# Patient Record
Sex: Female | Born: 1975 | Race: White | Hispanic: No | Marital: Single | Smoking: Former smoker
Health system: Southern US, Community
[De-identification: ages and names within clinical notes are randomized; demographics above are authoritative.]

## PROBLEM LIST (undated history)

## (undated) DIAGNOSIS — H409 Unspecified glaucoma: Secondary | ICD-10-CM

## (undated) DIAGNOSIS — I10 Essential (primary) hypertension: Secondary | ICD-10-CM

## (undated) DIAGNOSIS — F909 Attention-deficit hyperactivity disorder, unspecified type: Secondary | ICD-10-CM

## (undated) DIAGNOSIS — M1990 Unspecified osteoarthritis, unspecified site: Secondary | ICD-10-CM

## (undated) DIAGNOSIS — T7840XA Allergy, unspecified, initial encounter: Secondary | ICD-10-CM

## (undated) DIAGNOSIS — E785 Hyperlipidemia, unspecified: Secondary | ICD-10-CM

## (undated) DIAGNOSIS — S069XAA Unspecified intracranial injury with loss of consciousness status unknown, initial encounter: Secondary | ICD-10-CM

## (undated) DIAGNOSIS — F319 Bipolar disorder, unspecified: Secondary | ICD-10-CM

## (undated) DIAGNOSIS — F419 Anxiety disorder, unspecified: Secondary | ICD-10-CM

## (undated) DIAGNOSIS — S069X9A Unspecified intracranial injury with loss of consciousness of unspecified duration, initial encounter: Secondary | ICD-10-CM

## (undated) HISTORY — DX: Hyperlipidemia, unspecified: E78.5

## (undated) HISTORY — PX: FRACTURE SURGERY: SHX138

## (undated) HISTORY — DX: Allergy, unspecified, initial encounter: T78.40XA

## (undated) HISTORY — PX: BRAIN SURGERY: SHX531

## (undated) HISTORY — PX: WISDOM TOOTH EXTRACTION: SHX21

## (undated) HISTORY — DX: Bipolar disorder, unspecified: F31.9

## (undated) HISTORY — DX: Anxiety disorder, unspecified: F41.9

---

## 2003-10-13 DIAGNOSIS — S069XAA Unspecified intracranial injury with loss of consciousness status unknown, initial encounter: Secondary | ICD-10-CM

## 2003-10-13 HISTORY — DX: Unspecified intracranial injury with loss of consciousness status unknown, initial encounter: S06.9XAA

## 2003-10-28 ENCOUNTER — Inpatient Hospital Stay (HOSPITAL_COMMUNITY): Admission: AC | Admit: 2003-10-28 | Discharge: 2003-11-06 | Payer: Self-pay

## 2003-10-28 HISTORY — PX: BRAIN SURGERY: SHX531

## 2003-11-06 ENCOUNTER — Inpatient Hospital Stay (HOSPITAL_COMMUNITY)
Admission: RE | Admit: 2003-11-06 | Discharge: 2003-12-07 | Payer: Self-pay | Admitting: Physical Medicine & Rehabilitation

## 2003-12-11 ENCOUNTER — Encounter
Admission: RE | Admit: 2003-12-11 | Discharge: 2004-02-27 | Payer: Self-pay | Admitting: Physical Medicine & Rehabilitation

## 2004-01-03 ENCOUNTER — Encounter
Admission: RE | Admit: 2004-01-03 | Discharge: 2004-04-02 | Payer: Self-pay | Admitting: Physical Medicine & Rehabilitation

## 2004-04-07 ENCOUNTER — Encounter
Admission: RE | Admit: 2004-04-07 | Discharge: 2004-07-06 | Payer: Self-pay | Admitting: Physical Medicine & Rehabilitation

## 2004-08-27 ENCOUNTER — Ambulatory Visit: Payer: Self-pay | Admitting: Physical Medicine & Rehabilitation

## 2004-09-12 ENCOUNTER — Encounter: Admission: RE | Admit: 2004-09-12 | Discharge: 2004-12-11 | Payer: Self-pay | Admitting: Psychology

## 2004-10-01 ENCOUNTER — Encounter
Admission: RE | Admit: 2004-10-01 | Discharge: 2004-12-30 | Payer: Self-pay | Admitting: Physical Medicine & Rehabilitation

## 2004-10-08 ENCOUNTER — Ambulatory Visit: Payer: Self-pay | Admitting: Physical Medicine & Rehabilitation

## 2004-10-31 ENCOUNTER — Ambulatory Visit (HOSPITAL_COMMUNITY)
Admission: RE | Admit: 2004-10-31 | Discharge: 2004-10-31 | Payer: Self-pay | Admitting: Physical Medicine & Rehabilitation

## 2005-01-26 ENCOUNTER — Encounter: Admission: RE | Admit: 2005-01-26 | Discharge: 2005-04-26 | Payer: Self-pay | Admitting: Psychology

## 2005-01-29 ENCOUNTER — Encounter
Admission: RE | Admit: 2005-01-29 | Discharge: 2005-04-29 | Payer: Self-pay | Admitting: Physical Medicine & Rehabilitation

## 2005-02-02 ENCOUNTER — Ambulatory Visit: Payer: Self-pay | Admitting: Physical Medicine & Rehabilitation

## 2005-02-23 ENCOUNTER — Ambulatory Visit: Payer: Self-pay | Admitting: Psychology

## 2005-03-31 ENCOUNTER — Ambulatory Visit: Payer: Self-pay | Admitting: Psychology

## 2005-05-08 ENCOUNTER — Ambulatory Visit: Payer: Self-pay | Admitting: Physical Medicine & Rehabilitation

## 2005-05-08 ENCOUNTER — Encounter
Admission: RE | Admit: 2005-05-08 | Discharge: 2005-08-06 | Payer: Self-pay | Admitting: Physical Medicine & Rehabilitation

## 2005-05-29 ENCOUNTER — Ambulatory Visit: Payer: Self-pay | Admitting: Psychology

## 2005-06-29 ENCOUNTER — Ambulatory Visit: Payer: Self-pay | Admitting: Psychology

## 2005-07-22 ENCOUNTER — Encounter: Admission: RE | Admit: 2005-07-22 | Discharge: 2005-10-20 | Payer: Self-pay | Admitting: Psychology

## 2005-07-22 ENCOUNTER — Ambulatory Visit: Payer: Self-pay | Admitting: Psychology

## 2005-08-19 ENCOUNTER — Ambulatory Visit: Payer: Self-pay | Admitting: Psychology

## 2005-09-25 ENCOUNTER — Emergency Department (HOSPITAL_COMMUNITY): Admission: EM | Admit: 2005-09-25 | Discharge: 2005-09-25 | Payer: Self-pay | Admitting: Emergency Medicine

## 2005-10-27 ENCOUNTER — Ambulatory Visit: Payer: Self-pay | Admitting: Psychology

## 2005-10-27 ENCOUNTER — Encounter: Admission: RE | Admit: 2005-10-27 | Discharge: 2006-01-25 | Payer: Self-pay | Admitting: Psychology

## 2005-11-06 ENCOUNTER — Ambulatory Visit: Payer: Self-pay | Admitting: Physical Medicine & Rehabilitation

## 2005-11-06 ENCOUNTER — Encounter
Admission: RE | Admit: 2005-11-06 | Discharge: 2006-02-04 | Payer: Self-pay | Admitting: Physical Medicine & Rehabilitation

## 2005-11-18 ENCOUNTER — Ambulatory Visit: Payer: Self-pay | Admitting: Psychology

## 2005-12-07 ENCOUNTER — Ambulatory Visit: Payer: Self-pay | Admitting: Physical Medicine & Rehabilitation

## 2005-12-23 ENCOUNTER — Ambulatory Visit: Payer: Self-pay | Admitting: Psychology

## 2006-01-20 ENCOUNTER — Ambulatory Visit: Payer: Self-pay | Admitting: Psychology

## 2006-02-17 ENCOUNTER — Ambulatory Visit: Payer: Self-pay | Admitting: Psychology

## 2006-03-05 ENCOUNTER — Encounter
Admission: RE | Admit: 2006-03-05 | Discharge: 2006-06-03 | Payer: Self-pay | Admitting: Physical Medicine & Rehabilitation

## 2006-03-05 ENCOUNTER — Ambulatory Visit: Payer: Self-pay | Admitting: Physical Medicine & Rehabilitation

## 2006-03-12 ENCOUNTER — Emergency Department (HOSPITAL_COMMUNITY): Admission: EM | Admit: 2006-03-12 | Discharge: 2006-03-12 | Payer: Self-pay | Admitting: Emergency Medicine

## 2006-04-13 ENCOUNTER — Encounter: Admission: RE | Admit: 2006-04-13 | Discharge: 2006-07-12 | Payer: Self-pay | Admitting: Psychology

## 2006-05-03 ENCOUNTER — Ambulatory Visit: Payer: Self-pay | Admitting: Physical Medicine & Rehabilitation

## 2006-05-31 ENCOUNTER — Ambulatory Visit: Payer: Self-pay | Admitting: Psychology

## 2006-07-12 ENCOUNTER — Ambulatory Visit: Payer: Self-pay | Admitting: Physical Medicine & Rehabilitation

## 2006-07-12 ENCOUNTER — Encounter
Admission: RE | Admit: 2006-07-12 | Discharge: 2006-10-10 | Payer: Self-pay | Admitting: Physical Medicine & Rehabilitation

## 2006-08-11 ENCOUNTER — Ambulatory Visit: Payer: Self-pay | Admitting: Psychology

## 2006-10-22 ENCOUNTER — Ambulatory Visit: Payer: Self-pay | Admitting: Physical Medicine & Rehabilitation

## 2006-10-22 ENCOUNTER — Encounter
Admission: RE | Admit: 2006-10-22 | Discharge: 2007-01-20 | Payer: Self-pay | Admitting: Physical Medicine & Rehabilitation

## 2007-01-06 ENCOUNTER — Ambulatory Visit: Payer: Self-pay | Admitting: Psychology

## 2007-02-10 ENCOUNTER — Encounter
Admission: RE | Admit: 2007-02-10 | Discharge: 2007-05-11 | Payer: Self-pay | Admitting: Physical Medicine & Rehabilitation

## 2007-02-10 ENCOUNTER — Ambulatory Visit: Payer: Self-pay | Admitting: Physical Medicine & Rehabilitation

## 2007-03-10 ENCOUNTER — Ambulatory Visit: Payer: Self-pay | Admitting: Psychology

## 2007-05-16 ENCOUNTER — Ambulatory Visit: Payer: Self-pay | Admitting: Psychology

## 2007-06-02 ENCOUNTER — Encounter
Admission: RE | Admit: 2007-06-02 | Discharge: 2007-07-12 | Payer: Self-pay | Admitting: Physical Medicine & Rehabilitation

## 2007-06-02 ENCOUNTER — Ambulatory Visit: Payer: Self-pay | Admitting: Physical Medicine & Rehabilitation

## 2007-07-18 ENCOUNTER — Ambulatory Visit: Payer: Self-pay | Admitting: Psychology

## 2007-07-18 ENCOUNTER — Encounter: Admission: RE | Admit: 2007-07-18 | Discharge: 2007-10-16 | Payer: Self-pay | Admitting: Psychology

## 2007-08-11 ENCOUNTER — Ambulatory Visit: Payer: Self-pay | Admitting: Physical Medicine & Rehabilitation

## 2007-09-15 ENCOUNTER — Ambulatory Visit: Payer: Self-pay | Admitting: Psychology

## 2007-11-15 ENCOUNTER — Ambulatory Visit: Payer: Self-pay | Admitting: Psychology

## 2007-11-18 ENCOUNTER — Ambulatory Visit: Payer: Self-pay | Admitting: Physical Medicine & Rehabilitation

## 2007-11-18 ENCOUNTER — Encounter
Admission: RE | Admit: 2007-11-18 | Discharge: 2007-11-21 | Payer: Self-pay | Admitting: Physical Medicine & Rehabilitation

## 2008-01-12 ENCOUNTER — Ambulatory Visit: Payer: Self-pay | Admitting: Psychology

## 2008-04-19 ENCOUNTER — Encounter
Admission: RE | Admit: 2008-04-19 | Discharge: 2008-04-20 | Payer: Self-pay | Admitting: Physical Medicine & Rehabilitation

## 2008-04-20 ENCOUNTER — Ambulatory Visit: Payer: Self-pay | Admitting: Physical Medicine & Rehabilitation

## 2010-11-01 ENCOUNTER — Encounter: Payer: Self-pay | Admitting: Neurosurgery

## 2011-02-24 NOTE — Assessment & Plan Note (Signed)
Sarah Salas is back regarding her brain injury.  We increased her Concerta  back in October to 36 mg and essentially this was a bit much for her and  she became a bit irritable and jittery.  She remains on her Zoloft,  Tegretol, and Topamax.  Moods have been fairly appropriate.  She is now  in line to do some volunteer work at [facility] and is  excited about that.  She continues to work on her diet and weight.  I do  not think she is as strict with her diet as she needs to be.  She is  active with working out, however.  She denies pain today.  She is  sleeping well.  Mood has been good.   REVIEW OF SYSTEMS:  Notable for the above.  Full review is in the  written health and history section of the chart.   SOCIAL HISTORY:  Outlined above.   PHYSICAL EXAM:  Blood pressure is 133/79.  Pulse 88.  Respiratory rate  18.  She is satting 97% on room air.  Patient is pleasant, alert and  oriented x3.  She remains a bit impulsive still.  She became tearful  again today after a bit of discussion although she settled down quickly.  She still lacks some social manners but is really overall much improved  in that department.  She has lost weight.  HEART:  Regular.  CHEST:  Clear.  ABDOMEN:  Soft and nontender.  Strength is 5/5.  Normal sensory exam is noted.  Normal cranial nerve  exam today.   ASSESSMENT:  1. Traumatic brain injury and emotional dyscontrol.  2. Depression and anxiety.  3. History of alcohol and tobacco abuse.  4. Obesity.  5. History of right tibial fracture.   PLAN:  1. We will dose Concerta down to 27 mg daily.  2. Continue Topamax, Klonopin, and Zoloft, as well as Tegretol.  3. Encourage vocational activities.  She will go for her driving test      next month.  I would like to see her move on to the next phase of      her life.  Hopefully, we are getting close.      Ranelle Oyster, M.D.  Electronically Signed     ZTS/MedQ  D:  11/21/2007 15:51:22  T:   11/22/2007 13:31:11  Job #:  [id number]

## 2011-02-24 NOTE — Assessment & Plan Note (Signed)
HISTORY:  Jacqueline is back regarding her brain injury and emotional  dyscontrol.  She has been doing generally fairly well until an episode  when her parents were out where she ordered a few Klonopin.  She is very  remorseful and resentful of this and states, I will never do it again.  She remains on Zoloft, Tegretol and Topamax.  She has found the decrease  in Topamax helped her cognitive blunting.  She denies pain today.  She  is still working on her weight.  She has been writing her memoir.   REVIEW OF SYSTEMS:  Notable for the above.  Full review is in the  written health and history section in chart.   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   PHYSICAL EXAMINATION:  VITAL SIGNS:  Blood pressure 137/67, pulse 98,  respiratory rate 20.  She is satting 97% on room air.  The patient's  weight is stable.  Emotionally she is about the same.  Still some  impulsivity and tearfulness at times.  Volume of her voice was better  today.  Balance was good.  HEART:  Regular.  CHEST:  Clear.  ABDOMEN:  Soft, nontender.  NEUROLOGIC:  Strength 5/5 with normal sensory exam.   ASSESSMENT:  1. Status post traumatic brain injury with emotional dyscontrol.  2. Depression and anxiety.  3. History of alcohol abuse.  4. Tobacco abuse.  5. Obesity.  6. History of right tibial fracture.   PLAN:  1. Will continue with Topamax as is.  2. I would prefer keeping Klonopin as it is, as she has generally been      doing better with this.  We discussed learning from this and moving      forward.  I think she has an idea of the ramifications and is      striving to do better.  3. Will try her on Ritalin to see if we can improve her attention and      focus.  She is a bit perseverative at times and perhaps improving      her attention and focus will help some of these symptoms, as well      as her lability.  4. Consider a switch from  Topamax to another agent to help with      emotional dyscontrol.  5. The patient prefers to stay away from any psychotics due to weight      gain aspects.  6. The patient prefers to stay away from increasing her Klonopin due      to cognitive blunting.  7. I will see her back in two months' time.  I did refill her Zoloft      today #45.      Ranelle Oyster, M.D.  Electronically Signed     ZTS/MedQ  D:  06/03/2007 16:07:49  T:  06/04/2007 17:11:21  Job #:  [id number]

## 2011-02-24 NOTE — Assessment & Plan Note (Signed)
Sarah Salas is back regarding her brain injury.  I last saw her in February.  Apparently, she is moving to [place] where she will be located near a  day program for brain injury patients.  She seems really excited about  this.  She wants to seek some vocational reentry through them as well.  She had an episode where she had something to drink last month.  Her  biggest complaint is some right heel pain, it has been worse with  activity.  It is preventing her from exercising much recently.  It  bothers her with weightbearing and particularly when she walks barefoot,  but it is a bit better when she wears her tennis shoes.  It does not  hurt when she first wakes up in the morning, but does get worse as she  progresses through the day.  She rates the pain 8/10.  She does reports  some agitation at times, but nothing more than baseline.   REVIEW OF SYSTEMS:  Notable for the above.  Full review is in the  written health and history section of the chart.   SOCIAL HISTORY:  As noted above.  Family remain involved in her care.   PHYSICAL EXAMINATION:  VITAL SIGNS:  Blood pressure 133/77, pulse 78,  respiratory rate 24, and she is saturating 95% on room air.  GENERAL:  The patient is pleasant, alert, and oriented x3.  Affect is  anxious.  Gait is antalgic to the right.  Right heel is painful to  palpation.  She had no heel pain with plantar fascia stretching,  rotation, or movement of the foot.  No swelling was seen.  She remains  overweight but generally stable.  She is tearful at times when talking  about her pain.  HEART:  Regular.  CHEST:  Clear.  ABDOMEN:  Soft and nontender.   ASSESSMENT:  1. 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  2. Depression and anxiety.  3. History of alcohol and tobacco abuse.  4. Obesity.  5. Right tibial fracture.  6. Likely heel spurs.   PLAN:  1. Continue low-dose Concerta 27 mg daily.  I gave her 2 months'  refills.  2. Topamax, Klonopin, Zoloft, and Tegretol.  We are about as stable as      we have been with her emotional levels.  With too much medications,      she has been blunted in her affect and cognition, so I really would      prefer not to change things.  3. I recommended over-the-counter naproxen 2 twice a day for right      heel as well as a heel cushion for her shoes.  She needs to wear      her tennis shoes as much as she can as well.  Try ice and observe.      She may benefit from injections if the pain is refractory.  4. The patient is moving out of town and is looking for someone to      continue her care in [place].  I will help her out in the      meantime with medications as appropriate.     Ranelle Oyster, M.D.  Electronically Signed    ZTS/MedQ  D:  04/20/2008 16:54:41  T:  04/21/2008 11:54:54  Job #:  [id number]

## 2011-02-24 NOTE — Assessment & Plan Note (Signed)
Sarah Salas is back regarding her brain injury.  She is doing quite well.  She  has done well with Concerta at 18 mg daily.  She has been much more calm  and controlled, and she notes that she is able to focus better.  Her  family corroborates that.  She remains on Zoloft, Tergitol, and Topamax  for mood stabilization.  She is sleeping fairly well.  She is down to  210 pounds, and has been very active working out, [employer].  She is  excited about that.  She wants to get back to driving.  She denies pain.   REVIEW OF SYSTEMS:  Without issues other than those mentioned above.   SOCIAL HISTORY:  The patient is living with her family and seems to be  doing pretty well at the moment.   PHYSICAL EXAM:  Blood pressure 96/56, pulse 108, respiratory rate 16.  She is satting 98% on room air.  The patient is pleasant, alert and oriented x3.  She is less impulsive,  although still loses control at times.  Volume of voice was good today.  She had excellent balance.  She has lost notable weight.  HEART:  Regular.  CHEST:  Clear.  ABDOMEN:  Soft and nontender.  Strength is 5/5 with normal sensory function.   ASSESSMENT:  1. Traumatic brain injury with emotional dyscontrol.  2. Depression and anxiety.  3. History of alcohol and tobacco abuse.  4. Obesity.  5. History of right tibial fracture.   PLAN:  1. Continue Topamax and Klonopin for now.  Zoloft will also remain      same dose.  2. Increase Concerta to 36 mg daily.  3. Encouraged ongoing exercise and diet as she is doing.  She is      making great strides.  4. Consider driving evaluation in the spring as well as vocational      referral.  5. I will see her back in 4 months' time.      Ranelle Oyster, M.D.  Electronically Signed     ZTS/MedQ  D:  08/12/2007 15:18:17  T:  08/13/2007 12:25:37  Job #:  [id number]
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
## 2014-12-21 ENCOUNTER — Emergency Department (HOSPITAL_COMMUNITY)
Admission: EM | Admit: 2014-12-21 | Discharge: 2014-12-21 | Disposition: A | Discharge status: Home / Self Care | Payer: Medicaid Other | Attending: Emergency Medicine | Admitting: Emergency Medicine

## 2014-12-21 ENCOUNTER — Encounter (HOSPITAL_COMMUNITY): Payer: Self-pay

## 2014-12-21 DIAGNOSIS — Z79899 Other long term (current) drug therapy: Secondary | ICD-10-CM | POA: Diagnosis not present

## 2014-12-21 DIAGNOSIS — Z87891 Personal history of nicotine dependence: Secondary | ICD-10-CM | POA: Insufficient documentation

## 2014-12-21 DIAGNOSIS — I10 Essential (primary) hypertension: Secondary | ICD-10-CM | POA: Insufficient documentation

## 2014-12-21 DIAGNOSIS — F419 Anxiety disorder, unspecified: Secondary | ICD-10-CM | POA: Diagnosis not present

## 2014-12-21 DIAGNOSIS — Z87820 Personal history of traumatic brain injury: Secondary | ICD-10-CM | POA: Insufficient documentation

## 2014-12-21 DIAGNOSIS — R000 Tachycardia, unspecified: Secondary | ICD-10-CM | POA: Diagnosis not present

## 2014-12-21 DIAGNOSIS — Z760 Encounter for issue of repeat prescription: Secondary | ICD-10-CM | POA: Insufficient documentation

## 2014-12-21 HISTORY — DX: Essential (primary) hypertension: I10

## 2014-12-21 HISTORY — DX: Unspecified intracranial injury with loss of consciousness status unknown, initial encounter: S06.9XAA

## 2014-12-21 HISTORY — DX: Unspecified intracranial injury with loss of consciousness of unspecified duration, initial encounter: S06.9X9A

## 2014-12-21 MED ORDER — CLONAZEPAM 1 MG PO TABS: 2.0000 mg | ORAL_TABLET | Freq: Every day | ORAL | Status: DC

## 2014-12-21 NOTE — ED Notes (Signed)
Pt requesting prescription for clonopin.  Sts she ran out the other day and went to her doctor to get refill and clinic was closed.  Was advised by medicaid to follow up here for prescription until new PCP can be established.

## 2014-12-21 NOTE — ED Provider Notes (Signed)
CSN: [id number]     Arrival date & time 12/21/14  1116 History   First MD Initiated Contact with Patient 12/21/14 1258     Chief Complaint  Patient presents with  . Medication Refill     (Consider location/radiation/quality/duration/timing/severity/associated sxs/prior Treatment) HPI   Sarah Salas is a 39 y.o. female requesting medication refill of Klonopin. She takes this for anxiety disorder, she will run out today. She presented to her primary care physician's office and the doors were closed and there was yellow Tape. She attempted to call and could not get through. States she feels anxious but this is the norm. She denies suicidal ideation, homicidal ideation, drug or alcohol abuse, auditory or visual hallucinations, chest pain, shortness of breath, history of DVT or PE, exogenous estrogen, calf pain or leg swelling, recent immobilization or surgery.  Past Medical History  Diagnosis Date  . Hypertension   . Brain injury    History reviewed. No pertinent past surgical history. History reviewed. No pertinent family history. History  Substance Use Topics  . Smoking status: Former Games developermoker  . Smokeless tobacco: Not on file  . Alcohol Use: No   OB History    No data available     Review of Systems  10 systems reviewed and found to be negative, except as noted in the HPI.   Allergies  Review of patient's allergies indicates not on file.  Home Medications   Prior to Admission medications   Not on File   BP 135/75 mmHg  Pulse 113  Temp(Src) 98 F (36.7 C) (Oral)  Resp 20  Ht 5\' 11"  (1.803 m)  Wt 326 lb (147.873 kg)  BMI 45.49 kg/m2  SpO2 98% Physical Exam  Constitutional: She is oriented to person, place, and time. She appears well-developed and well-nourished. No distress.  HENT:  Head: Normocephalic and atraumatic.  Mouth/Throat: Oropharynx is clear and moist.  Eyes: Conjunctivae and EOM are normal. Pupils are equal, round, and reactive to light.  Neck:  Normal range of motion.  Cardiovascular: Regular rhythm and intact distal pulses.   Mildly tachycardic  Pulmonary/Chest: Effort normal and breath sounds normal. No stridor. No respiratory distress. She has no wheezes. She has no rales. She exhibits no tenderness.  Abdominal: Soft. Bowel sounds are normal. There is no tenderness.  Musculoskeletal: Normal range of motion. She exhibits no edema or tenderness.  No calf asymmetry, superficial collaterals, palpable cords, edema, Homans sign negative bilaterally.    Neurological: She is alert and oriented to person, place, and time.  Psychiatric: She has a normal mood and affect.  Nursing note and vitals reviewed.   ED Course  Procedures (including critical care time) Labs Review Labs Reviewed - No data to display  Imaging Review No results found.   EKG Interpretation None      MDM   Final diagnoses:  Medication refill    Filed Vitals:   12/21/14 1127 12/21/14 1302  BP: 135/75 145/96  Pulse: 113 111  Temp: 98 F (36.7 C)   TempSrc: Oral   Resp: 20   Height: 5\' 11"  (1.803 m)   Weight: 326 lb (147.873 kg)   SpO2: 98% 100%    Sarah Salas is a pleasant 39 y.o. female requesting refill of Klonopin. Patient is about her read out today. She takes it for anxiety. I reviewed the [place] state DEA database and patient was written a prescription at the end of January. Patient does not have multiple visits to the ED, her  story is possible that she went to her primary care office and it was closed, she cannot reach them. I've advised her that although it is our policy not to refill prescriptions for controlled substances I will make an exception. She understands that she will not be given further scripts in the future. I've given her a resource guide and also the contact information for the wellness Center to help her stop his primary care. She is mildly tachycardic but I think that that is secondary to her baseline anxiety.    Evaluation does not show pathology that would require ongoing emergent intervention or inpatient treatment. Pt is hemodynamically stable and mentating appropriately. Discussed findings and plan with patient/guardian, who agrees with care plan. All questions answered. Return precautions discussed and outpatient follow up given.   New Prescriptions   CLONAZEPAM (KLONOPIN) 1 MG TABLET    Take 2 tablets (2 mg total) by mouth daily.         Wynetta Emery, PA-C 12/21/14 1452  Gilda Crease, MD 12/21/14 [phone]

## 2014-12-21 NOTE — Discharge Instructions (Signed)
Do not hesitate to return to the emergency room for any new, worsening or concerning symptoms.  Please obtain primary care using resource guide below. But the minute you were seen in the emergency room and that they will need to obtain records for further outpatient management.   Medication Refill, Emergency Department We have refilled your medication today as a courtesy to you. It is best for your medical care, however, to take care of getting refills done through your primary caregiver's office. They have your records and can do a better job of follow-up than we can in the emergency department. On maintenance medications, we often only prescribe enough medications to get you by until you are able to see your regular caregiver. This is a more expensive way to refill medications. In the future, please plan for refills so that you will not have to use the emergency department for this. Thank you for your help. Your help allows [place] to better take care of the daily emergencies that enter our department. Document Released: 01/15/2004 Document Revised: 12/21/2011 Document Reviewed: 01/05/2014 [facility] Patient Information 2015 [place], [place]. This information is not intended to replace advice given to you by your health care provider. Make sure you discuss any questions you have with your health care provider.    Emergency Department Resource Guide 1) Find a Doctor and Pay Out of Pocket Although you won't have to find out who is covered by your insurance plan, it is a good idea to ask around and get recommendations. You will then need to call the office and see if the doctor you have chosen will accept you as a new patient and what types of options they offer for patients who are self-pay. Some doctors offer discounts or will set up payment plans for their patients who do not have insurance, but you will need to ask so you aren't surprised when you get to your appointment.  2) Contact Your Local Health  Department Not all health departments have doctors that can see patients for sick visits, but many do, so it is worth a call to see if yours does. If you don't know where your local health department is, you can check in your phone book. The CDC also has a tool to help you locate your state's health department, and many state websites also have listings of all of their local health departments.  3) Find a Walk-in Clinic If your illness is not likely to be very severe or complicated, you may want to try a walk in clinic. These are popping up all over the country in pharmacies, drugstores, and shopping centers. They're usually staffed by nurse practitioners or physician assistants that have been trained to treat common illnesses and complaints. They're usually fairly quick and inexpensive. However, if you have serious medical issues or chronic medical problems, these are probably not your best option.  No Primary Care Doctor: - Call Health Connect at  [phone] - they can help you locate a primary care doctor that  accepts your insurance, provides certain services, etc. - Physician Referral Service- [phone]  Chronic Pain Problems: Organization         Address  Phone   Notes  [facility]  [phone] Patients need to be referred by their primary care doctor.   Medication Assistance: Organization         Address  Phone   Notes  [facility] Medication Assistance Program [address] [place] [place], [place]  [address] [phone] --Must be a resident of [facility] -- Must have NO insurance coverage whatsoever (no Medicaid/ Medicare, etc.) -- The pt. MUST have a primary care doctor that directs their care regularly and follows them in the community   [facility]ist  [phone]   [employer]  [phone]    Agencies that provide inexpensive medical care: Organization         Address  Phone   Notes  [facility]  [phone]   [facility]    [phone]   [facility] [address] [place], [place] [address] [phone]   [facility] [address] [place] [place] [phone]   Planned Parenthood    [phone]   [facility]    [phone]   Community Health and [facility]  [address], [place] Phone:  [phone], Fax:  [phone] Hours of Operation:  9 am - 6 pm, M-F.  Also accepts Medicaid/Medicare and self-pay.  [facility]  [address], [place] Phone: [phone], Fax: [phone]. Hours of Operation:  8:30 am - 5:30 pm, M-F.  Also accepts Medicaid and self-pay.  [facility] [place] [address] [place] Phone: [phone]   [facility] [address] Natasha Bence [place], [place] [phone], Ext. [phone] Mondays & Thursdays: 7-9 AM.  First 15 patients are seen on a first come, first serve basis.    Medicaid-accepting [facility] Providers:  Organization         Address  Phone   Notes  [facility] [address], [place] [phone] Also accepts self-pay patients.  [facility] [address] Laurell Josephs [place], [place]  [phone]   [facility] [address], [place] [phone]   [facility] [address] [place] [phone]   Renaye Rakers [address], [place]   [phone] Only accepts [place] patients after they have their name applied to their card.   Self-Pay (no insurance) in [facility]:  Organization         Address  Phone   Notes  Sickle Cell Patients, [facility] [address] [place], [place] [phone]   [facility] [address] [place], [place] [phone]   [facility] [place]  [address],  [place] [phone]   [facility]/Dr. Osei-Bonsu  [address] [place] or [address], [place] [phone] Phone number for both [place] and [place] locations is the same.  [facility] [address] [place] [phone]   [facility] [address] [place] or [address] [phone]   [facility] [address] [place] [phone], phone; [phone], fax Sees patients 1st and 3rd Saturday of every month.  Must not qualify for public or private insurance (i.e. Medicaid, Medicare, [place] Health Choice, Veterans' Benefits)  Household income should be no more than 200% of the poverty level The clinic cannot treat you if you are pregnant or think you are pregnant  Sexually transmitted diseases are not treated at the clinic.    Dental Care: Organization  Address  Phone  Notes  [facility] [address], [place] [phone]) [phone] Accepts children up to age 37 who are enrolled in [place]icaid or [place] Health Choice; pregnant women with a Medicaid card; and children who have applied for Medicaid or [place] Health Choice, but were declined, whose parents can pay a reduced fee at time of service.  [facility] [place]  [address], [place] [phone] Accepts children up to age 37 who are enrolled in [place] or [place] Health Choice; pregnant women with a Medicaid card; and children who have applied for Medicaid or [place] Health Choice, but were declined, whose parents can pay a reduced fee at time of service.  [facility]  [address], [place] [phone] Patients are seen by appointment only. Walk-ins are not accepted. [facility] will see patients 37 years of age and older. Monday - Tuesday (8am-5pm) Most Wednesdays  (8:30-5pm) $30 per visit, cash only  [facility]  [address], [place] [facility] [place] [phone] Patients are seen by appointment only. Walk-ins are not accepted. [facility] will see patients 37 years of age and older. One Wednesday Evening (Monthly: Volunteer Based).  $30 per visit, cash only  [facility]  [phone] for adults; Children under age 434, call Graduate Pediatric Dentistry at [phone]. Children aged 464-14, please call [phone] to request a pediatric application.  Dental services are provided in all areas of dental care including fillings, crowns and bridges, complete and partial dentures, implants, gum treatment, root canals, and extractions. Preventive care is also provided. Treatment is provided to both adults and children. Patients are selected via a lottery and there is often a waiting list.   [facility] [address], [place]  [phone] [email or web address]   [facility] [address], [place], [place] [phone], Ext. [phone] Second and Fourth Thursday of each month, opens at 6:30 AM; Clinic ends at 9 AM.  Patients are seen on a first-come first-served basis, and a limited number are seen during each clinic.   [facility]  [address], [place], [place] [phone]   Eligibility Requirements You must have lived in [place], [place], or [place] counties for at least the last three months.   You cannot be eligible for state or federal sponsored [place] Cityhealthcare insurance, including CIGNAVeterans Administration, [place]icaid, or [employer]Medicare.   You generally cannot be eligible for healthcare insurance through your employer.    How to apply: Eligibility screenings are held every Tuesday and Wednesday afternoon from 1:00 pm until 4:00 pm. You do not need an appointment for the interview!  [facility] [address], [place], [place] [phone]   [facility]  Health Department  [phone]   [facility]  [phone]   [facility]  [phone]    Behavioral Health Resources in the Community: Intensive Outpatient Programs Organization         Address  Phone  Notes  [facility] [address], [place], [place] [phone]   [facility] [address], [place], [place] [phone]   [facility] [address], [place], [place]  [phone]   [facility] [address],  [place], [place] [phone] or [phone]   Substance Abuse Resources  Organization         Address  Phone  Notes  [facility]  [phone]   [facility]  [phone]   [facility]  [phone]   [facility]  [phone]   Residential & Outpatient Substance Abuse Program  [phone]   Psychological Services Organization         Address  Phone  Notes  [facility]  [phone]- [phone]   [facility]  [phone]- [phone]   [facility] [address], [place] [phone] or [phone]    Mobile Crisis Teams Organization         Address  Phone  Notes  [facility], Mobile Crisis Care Unit  [phone]   [facility]  [address] [place], [place] [phone]   Doristine LocksSharon DeEsch [address] [place] [phone]    Self-Help/Support Groups Organization         Address  Phone             Notes  [facility] [place] - variety of support groups  [phone] Call for more information  Narcotics Anonymous (NA), [facility] [address], [place]  2 meetings at this location   Statisticianesidential Treatment Programs Organization         Address  Phone  Notes  [facility] [address],    [place]  [phone]   [facility]  [address], [place] [phone], [place], [place]  [phone]   [facility] [address], [place] [phone] Admissions: 8am-3pm M-F  [facility] [address]    [place], [place] [phone]   [facility] [address], [place], [place] [phone]   [facility] [address]  [place], [place] [phone]   [facility] - Intensive Outpatient [address], [place], [place] [phone]   [facility] ([facility] [address]  [place], [place] [phone] or [phone]   [facility] ([facility]) [address] [place], [place] [phone] Accepts Medicaid  [facility] [address]  [place] [phone] Substance Abuse/Addiction Treatment   [facility] Resources Organization         Address  Phone  Notes  [facility]  [phone]) [phone]   Angie FavaJulie Brannon, PhD [address], [address] [place], [place]   [phone] or [phone]) [phone]   [facility]   [address] [place], [place] [phone]   [facility] [address], [place], [place] [phone] Insurance/Medicaid/sponsorship through [facility]  [facility] [address]                                    [place], [place] [phone] Therapy/tele-psych/case  [facility] [address]   [place], [place] [phone]    Dr. Lolly MustacheArfeen  [phone]   [facility]  [employer] [facility] 1) [address], [place] 2) [address], [place] 3)  [address], [place] [phone]  [phone]   [facility] Child Abuse Hotline [phone] or [phone](336) [phone] (After Hours)

## 2016-02-13 ENCOUNTER — Encounter (HOSPITAL_COMMUNITY): Payer: Self-pay | Admitting: Emergency Medicine

## 2016-02-13 ENCOUNTER — Emergency Department (HOSPITAL_COMMUNITY)
Admission: EM | Admit: 2016-02-13 | Discharge: 2016-02-13 | Disposition: A | Discharge status: Home / Self Care | Payer: Medicaid Other | Attending: Emergency Medicine | Admitting: Emergency Medicine

## 2016-02-13 DIAGNOSIS — J302 Other seasonal allergic rhinitis: Secondary | ICD-10-CM | POA: Diagnosis not present

## 2016-02-13 DIAGNOSIS — Z79899 Other long term (current) drug therapy: Secondary | ICD-10-CM | POA: Diagnosis not present

## 2016-02-13 DIAGNOSIS — J029 Acute pharyngitis, unspecified: Secondary | ICD-10-CM

## 2016-02-13 DIAGNOSIS — I10 Essential (primary) hypertension: Secondary | ICD-10-CM | POA: Insufficient documentation

## 2016-02-13 DIAGNOSIS — Z87891 Personal history of nicotine dependence: Secondary | ICD-10-CM | POA: Diagnosis not present

## 2016-02-13 MED ORDER — CETIRIZINE-PSEUDOEPHEDRINE ER 5-120 MG PO TB12: 1.0000 | ORAL_TABLET | Freq: Two times a day (BID) | ORAL | Status: DC

## 2016-02-13 MED ORDER — HYDROCODONE-ACETAMINOPHEN 7.5-325 MG/15ML PO SOLN: 15.0000 mL | Freq: Four times a day (QID) | ORAL | Status: AC | PRN

## 2016-02-13 MED ORDER — FLUTICASONE PROPIONATE 50 MCG/ACT NA SUSP: 2.0000 | Freq: Every day | NASAL | Status: DC

## 2016-02-13 MED ORDER — GI COCKTAIL ~~LOC~~
30.0000 mL | Freq: Once | ORAL | Status: AC
  Administered 2016-02-13: 30 mL via ORAL
  Filled 2016-02-13: qty 30

## 2016-02-13 NOTE — ED Provider Notes (Signed)
CSN: [id number]     Arrival date & time 02/13/16  1001 History   First MD Initiated Contact with Patient 02/13/16 1038     Chief Complaint  Patient presents with  . Sore Throat     (Consider location/radiation/quality/duration/timing/severity/associated sxs/prior Treatment) Patient is a 40 y.o. female presenting with pharyngitis. The history is provided by the patient.  Sore Throat This is a new problem. Episode onset: 5 days ago. The problem occurs constantly. The problem has been gradually worsening. Pertinent negatives include no chest pain, no abdominal pain, no headaches and no shortness of breath. Associated symptoms comments: Sore throat, nasal congestion, occassional cough.  Unable to sleep due to severe sore throat.. The symptoms are aggravated by swallowing. Nothing relieves the symptoms. Treatments tried: chloraseptic,  halls, q4 hour allergy med  The treatment provided no relief.    Past Medical History  Diagnosis Date  . Hypertension   . Brain injury [facility])    History reviewed. No pertinent past surgical history. History reviewed. No pertinent family history. Social History  Substance Use Topics  . Smoking status: Former Games developer  . Smokeless tobacco: None  . Alcohol Use: No   OB History    No data available     Review of Systems  Constitutional: Negative for fever.  Respiratory: Negative for shortness of breath.   Cardiovascular: Negative for chest pain.  Gastrointestinal: Negative for abdominal pain.  Neurological: Negative for headaches.  All other systems reviewed and are negative.     Allergies  Review of patient's allergies indicates no known allergies.  Home Medications   Prior to Admission medications   Medication Sig Start Date End Date Taking? Authorizing Provider  clonazePAM (KLONOPIN) 1 MG tablet Take 2 tablets (2 mg total) by mouth daily. 12/21/14   Nicole Pisciotta, PA-C   BP 132/88 mmHg  Pulse 101  Temp(Src) 98 F (36.7 C) (Oral)  Resp 17   SpO2 98% Physical Exam  Constitutional: She is oriented to person, place, and time. She appears well-developed and well-nourished. No distress.  HENT:  Head: Normocephalic and atraumatic.  Mouth/Throat: Posterior oropharyngeal erythema present. No oropharyngeal exudate or posterior oropharyngeal edema.  Eyes: Conjunctivae and EOM are normal. Pupils are equal, round, and reactive to light.  Neck: Normal range of motion. Neck supple. No tracheal tenderness present.  Hoarse voice  Cardiovascular: Normal rate, regular rhythm and intact distal pulses.   No murmur heard. Pulmonary/Chest: Effort normal and breath sounds normal. No stridor. No respiratory distress. She has no wheezes. She has no rales.  Musculoskeletal: Normal range of motion. She exhibits no edema or tenderness.  Neurological: She is alert and oriented to person, place, and time.  Skin: Skin is warm and dry. No rash noted. No erythema.  Psychiatric: She has a normal mood and affect. Her behavior is normal.  Nursing note and vitals reviewed.   ED Course  Procedures (including critical care time) Labs Review Labs Reviewed - No data to display  Imaging Review No results found. I have personally reviewed and evaluated these images and lab results as part of my medical decision-making.   EKG Interpretation None      MDM   Final diagnoses:  Seasonal allergic rhinitis  Sore throat    Pt here with nasal drainage, sore throat, hoarse voice and cough for 5 days.  No fever or infectious sx.  Pt most likely has allergy related sx.  Given pain meds for sore throat and started on zyrtec and flonase.  No evidence of strep, epiglottitis, RPA or PTA    Gwyneth SproutWhitney Yidel Teuscher, MD 02/13/16 1055

## 2016-02-13 NOTE — ED Notes (Signed)
Pt c/o sore throat, cough, nasal congestion, hoarseness onset Sunday.

## 2016-02-13 NOTE — Discharge Instructions (Signed)
Allergic Rhinitis Allergic rhinitis is when the mucous membranes in the nose respond to allergens. Allergens are particles in the air that cause your body to have an allergic reaction. This causes you to release allergic antibodies. Through a chain of events, these eventually cause you to release histamine into the blood stream. Although meant to protect the body, it is this release of histamine that causes your discomfort, such as frequent sneezing, congestion, and an itchy, runny nose.  CAUSES Seasonal allergic rhinitis (hay fever) is caused by pollen allergens that may come from grasses, trees, and weeds. Year-round allergic rhinitis (perennial allergic rhinitis) is caused by allergens such as house dust mites, pet dander, and mold spores. SYMPTOMS  Nasal stuffiness (congestion).  Itchy, runny nose with sneezing and tearing of the eyes. DIAGNOSIS Your health care provider can help you determine the allergen or allergens that trigger your symptoms. If you and your health care provider are unable to determine the allergen, skin or blood testing may be used. Your health care provider will diagnose your condition after taking your health history and performing a physical exam. Your health care provider may assess you for other related conditions, such as asthma, pink eye, or an ear infection. TREATMENT Allergic rhinitis does not have a cure, but it can be controlled by:  Medicines that block allergy symptoms. These may include allergy shots, nasal sprays, and oral antihistamines.  Avoiding the allergen. Hay fever may often be treated with antihistamines in pill or nasal spray forms. Antihistamines block the effects of histamine. There are over-the-counter medicines that may help with nasal congestion and swelling around the eyes. Check with your health care provider before taking or giving this medicine. If avoiding the allergen or the medicine prescribed do not work, there are many new medicines  your health care provider can prescribe. Stronger medicine may be used if initial measures are ineffective. Desensitizing injections can be used if medicine and avoidance does not work. Desensitization is when a patient is given ongoing shots until the body becomes less sensitive to the allergen. Make sure you follow up with your health care provider if problems continue. HOME CARE INSTRUCTIONS It is not possible to completely avoid allergens, but you can reduce your symptoms by taking steps to limit your exposure to them. It helps to know exactly what you are allergic to so that you can avoid your specific triggers. SEEK MEDICAL CARE IF:  You have a fever.  You develop a cough that does not stop easily (persistent).  You have shortness of breath.  You start wheezing.  Symptoms interfere with normal daily activities.   This information is not intended to replace advice given to you by your health care provider. Make sure you discuss any questions you have with your health care provider.   Document Released: 06/23/2001 Document Revised: 10/19/2014 Document Reviewed: 06/05/2013 Elsevier Interactive Patient Education 2016 Elsevier Inc.  

## 2016-09-14 ENCOUNTER — Ambulatory Visit (HOSPITAL_COMMUNITY)
Admission: EM | Admit: 2016-09-14 | Discharge: 2016-09-14 | Disposition: A | Discharge status: Home / Self Care | Payer: Medicaid Other | Attending: Family Medicine | Admitting: Family Medicine

## 2016-09-14 ENCOUNTER — Encounter (HOSPITAL_COMMUNITY): Payer: Self-pay | Admitting: Emergency Medicine

## 2016-09-14 DIAGNOSIS — J069 Acute upper respiratory infection, unspecified: Secondary | ICD-10-CM | POA: Diagnosis not present

## 2016-09-14 DIAGNOSIS — B9789 Other viral agents as the cause of diseases classified elsewhere: Secondary | ICD-10-CM

## 2016-09-14 MED ORDER — CETIRIZINE-PSEUDOEPHEDRINE ER 5-120 MG PO TB12
1.0000 | ORAL_TABLET | Freq: Every day | ORAL | 0 refills | Status: DC
Start: 2016-09-14 — End: 2017-08-11

## 2016-09-14 MED ORDER — IPRATROPIUM BROMIDE 0.06 % NA SOLN: 2.0000 | Freq: Four times a day (QID) | NASAL | 1 refills | Status: DC

## 2016-09-14 NOTE — ED Triage Notes (Signed)
On Wednesday her throat starting hurting.  After 24 hours throat pain subsided and had runny nose, cough, drainage in throat.  Non-productive cough.  Symptoms are better today per patient.  Denies fever.

## 2016-09-14 NOTE — ED Notes (Signed)
Bed: UC03 Expected date:  Expected time:  Means of arrival:  Comments: 

## 2016-09-14 NOTE — Discharge Instructions (Signed)
Drink plenty of fluids as discussed, use medicine as prescribed, and mucinex or delsym for cough. Return or see your doctor if further problems °

## 2016-09-14 NOTE — ED Provider Notes (Signed)
[facility]    CSN: [id number] Arrival date & time: 09/14/16  1130     History   Chief Complaint Chief Complaint  Patient presents with  . URI    HPI Gomez CleverlyCarri Sedano is a 40 y.o. female.   The history is provided by the patient.  URI  Presenting symptoms: congestion, cough and rhinorrhea   Presenting symptoms: no fever   Severity:  Mild Onset quality:  Gradual Duration:  5 days Progression:  Partially resolved Chronicity:  New Relieved by:  Prescription medications Associated symptoms: sneezing     Past Medical History:  Diagnosis Date  . Brain injury (HCC)   . Hypertension     There are no active problems to display for this patient.   Past Surgical History:  Procedure Laterality Date  . BRAIN SURGERY      OB History    No data available       Home Medications    Prior to Admission medications   Medication Sig Start Date End Date Taking? Authorizing Provider  cetirizine-pseudoephedrine (ZYRTEC-D) 5-120 MG tablet Take 1 tablet by mouth 2 (two) times daily. 02/13/16   Gwyneth SproutWhitney Plunkett, MD  clonazePAM (KLONOPIN) 1 MG tablet Take 2 tablets (2 mg total) by mouth daily. Patient not taking: Reported on 09/14/2016 12/21/14   Joni ReiningNicole Pisciotta, PA-C  fluticasone (FLONASE) 50 MCG/ACT nasal spray Place 2 sprays into both nostrils daily. Patient not taking: Reported on 09/14/2016 02/13/16   Gwyneth SproutWhitney Plunkett, MD  HYDROcodone-acetaminophen (HYCET) 7.5-325 mg/15 ml solution Take 15 mLs by mouth 4 (four) times daily as needed for severe pain. Patient not taking: Reported on 09/14/2016 02/13/16 02/12/17  Gwyneth SproutWhitney Plunkett, MD    Family History No family history on file.  Social History Social History  Substance Use Topics  . Smoking status: Former Games developermoker  . Smokeless tobacco: Not on file  . Alcohol use No     Allergies   Patient has no known allergies.   Review of Systems Review of Systems  Constitutional: Negative.  Negative for fever.  HENT: Positive  for congestion, postnasal drip, rhinorrhea and sneezing.   Respiratory: Positive for cough.   Cardiovascular: Negative.   All other systems reviewed and are negative.    Physical Exam Triage Vital Signs ED Triage Vitals [09/14/16 1157]  Enc Vitals Group     BP 122/88     Pulse Rate 88     Resp 24     Temp 98.2 F (36.8 C)     Temp Source Oral     SpO2 99 %     Weight      Height      Head Circumference      Peak Flow      Pain Score 1     Pain Loc      Pain Edu?      Excl. in GC?    No data found.   Updated Vital Signs BP 122/88 (BP Location: Left Arm) Comment (BP Location): large cuff  Pulse 88   Temp 98.2 F (36.8 C) (Oral)   Resp 24   SpO2 99%   Visual Acuity Right Eye Distance:   Left Eye Distance:   Bilateral Distance:    Right Eye Near:   Left Eye Near:    Bilateral Near:     Physical Exam  Constitutional: She is oriented to person, place, and time. She appears well-developed and well-nourished.  HENT:  Right Ear: External ear normal.  Left Ear:  External ear normal.  Nose: Nose normal.  Mouth/Throat: Oropharynx is clear and moist.  Eyes: Pupils are equal, round, and reactive to light.  Neck: Normal range of motion. Neck supple.  Cardiovascular: Normal rate and regular rhythm.   Pulmonary/Chest: Effort normal and breath sounds normal.  Lymphadenopathy:    She has no cervical adenopathy.  Neurological: She is alert and oriented to person, place, and time.  Skin: Skin is warm and dry.  Nursing note and vitals reviewed.    UC Treatments / Results  Labs (all labs ordered are listed, but only abnormal results are displayed) Labs Reviewed - No data to display  EKG  EKG Interpretation None       Radiology No results found.  Procedures Procedures (including critical care time)  Medications Ordered in UC Medications - No data to display   Initial Impression / Assessment and Plan / UC Course  I have reviewed the triage vital signs  and the nursing notes.  Pertinent labs & imaging results that were available during my care of the patient were reviewed by me and considered in my medical decision making (see chart for details).  Clinical Course       Final Clinical Impressions(s) / UC Diagnoses   Final diagnoses:  None    New Prescriptions New Prescriptions   No medications on file     Linna HoffJames D Kindl, MD 09/14/16 1219

## 2017-08-11 ENCOUNTER — Encounter (HOSPITAL_COMMUNITY): Payer: Self-pay | Admitting: Emergency Medicine

## 2017-08-11 ENCOUNTER — Ambulatory Visit (HOSPITAL_COMMUNITY)
Admission: EM | Admit: 2017-08-11 | Discharge: 2017-08-11 | Disposition: A | Discharge status: Home / Self Care | Payer: Medicaid Other | Attending: Family Medicine | Admitting: Family Medicine

## 2017-08-11 DIAGNOSIS — F419 Anxiety disorder, unspecified: Secondary | ICD-10-CM

## 2017-08-11 MED ORDER — CITALOPRAM HYDROBROMIDE 10 MG PO TABS: 10.0000 mg | ORAL_TABLET | Freq: Every day | ORAL | 3 refills | Status: DC

## 2017-08-11 NOTE — Discharge Instructions (Signed)
One great office that takes Medicaid is [facility] but they usually have a 6 week waiting time for appointments.
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
## 2017-08-11 NOTE — ED Triage Notes (Signed)
PT reports she needs "mood medicine." PT took Wellbutrin a few years ago, but did not like it. PT has been taking HTP over the counter.  PT reports a few people in her family are taking citalopram with good results.

## 2017-09-06 ENCOUNTER — Encounter (HOSPITAL_COMMUNITY): Payer: Self-pay | Admitting: Emergency Medicine

## 2017-09-06 ENCOUNTER — Other Ambulatory Visit: Payer: Self-pay

## 2017-09-06 ENCOUNTER — Ambulatory Visit (HOSPITAL_COMMUNITY)
Admission: EM | Admit: 2017-09-06 | Discharge: 2017-09-06 | Disposition: A | Discharge status: Home / Self Care | Payer: Medicaid Other

## 2017-09-06 DIAGNOSIS — M79604 Pain in right leg: Secondary | ICD-10-CM

## 2017-09-06 MED ORDER — CITALOPRAM HYDROBROMIDE 20 MG PO TABS: 20.0000 mg | ORAL_TABLET | Freq: Every day | ORAL | 0 refills | Status: DC

## 2017-09-06 MED ORDER — NAPROXEN 375 MG PO TABS: 375.0000 mg | ORAL_TABLET | Freq: Two times a day (BID) | ORAL | 0 refills | Status: AC

## 2017-09-06 NOTE — Discharge Instructions (Signed)
Nothing concerning regarding your lower leg pain. We will try pain relief with Naprosyn, ice and heat. I expect pain to improve over the next 1-2 weeks. Please return if symptoms fail to improve.   Please get set up with PCP to refill your Celexa.

## 2017-09-06 NOTE — ED Provider Notes (Signed)
[facility]    CSN: [id number] Arrival date & time: 09/06/17  1006     History   Chief Complaint Chief Complaint  Patient presents with  . Leg Pain    HPI Sarah Salas is a 41 y.o. female coming in with right lower leg pain. She endorses pain on the front of her right lower shin since Saturday. Pain worsens with activity, relieved with rest. No changes of color or temperature with activity. No increase in activity of recent. Minimal activity, except walking 2 big dogs. She has tried Tylenol 500 mg, with minimal relief. No pain in calf, no chest pain, cough, or shortness of breath.   HPI  Past Medical History:  Diagnosis Date  . Brain injury (HCC)   . Hypertension     There are no active problems to display for this patient.   Past Surgical History:  Procedure Laterality Date  . BRAIN SURGERY      OB History    No data available       Home Medications    Prior to Admission medications   Medication Sig Start Date End Date Taking? Authorizing Provider  acetaminophen (TYLENOL) 500 MG tablet Take 500 mg by mouth 4 (four) times daily.   Yes [provider]  ipratropium (ATROVENT) 0.06 % nasal spray Place 2 sprays into both nostrils 4 (four) times daily. 09/14/16  Yes Kindl, Quita SkyeJames D, MD  citalopram (CELEXA) 20 MG tablet Take 1 tablet (20 mg total) by mouth daily. 09/06/17 10/06/17  Tyianna Menefee C, PA-C  naproxen (NAPROSYN) 375 MG tablet Take 1 tablet (375 mg total) by mouth 2 (two) times daily for 7 days. 09/06/17 09/13/17  Donyel Castagnola, Junius CreamerHallie C, PA-C    Family History History reviewed. No pertinent family history.  Social History Social History   Tobacco Use  . Smoking status: Former Games developermoker  . Smokeless tobacco: Never Used  Substance Use Topics  . Alcohol use: No  . Drug use: No     Allergies   Patient has no known allergies.   Review of Systems Review of Systems  Respiratory: Negative for shortness of breath.   Cardiovascular:  Negative for chest pain.  Musculoskeletal: Negative for arthralgias.       Right lower shin pain  Skin: Negative for color change, pallor and rash.  Neurological: Negative for weakness and numbness.     Physical Exam Triage Vital Signs ED Triage Vitals  Enc Vitals Group     BP 09/06/17 1039 115/83     Pulse Rate 09/06/17 1039 94     Resp 09/06/17 1039 20     Temp 09/06/17 1039 97.8 F (36.6 C)     Temp Source 09/06/17 1039 Oral     SpO2 09/06/17 1039 97 %     Weight --      Height --      Head Circumference --      Peak Flow --      Pain Score 09/06/17 1034 5     Pain Loc --      Pain Edu? --      Excl. in GC? --    No data found.  Updated Vital Signs BP 115/83 (BP Location: Right Arm)   Pulse 94   Temp 97.8 F (36.6 C) (Oral)   Resp 20   SpO2 97%    Physical Exam  Constitutional: She is oriented to person, place, and time. She appears well-developed and well-nourished.  HENT:  Head: Normocephalic  and atraumatic.  Cardiovascular: Normal rate and regular rhythm.  Pulmonary/Chest: Effort normal and breath sounds normal.  Musculoskeletal:  Right lower leg: no edema or erythema, temperature similar to left. Full ROM of ankle and knee. Pain increased with dorsiflexion of ankle. Mild tenderness to palpation from lower shin to mid shin. No tenderness to calf. Dorsalis Pedis 2+  Neurological: She is alert and oriented to person, place, and time. No sensory deficit.  Skin: Skin is warm and dry. Capillary refill takes less than 2 seconds.     UC Treatments / Results  Labs (all labs ordered are listed, but only abnormal results are displayed) Labs Reviewed - No data to display  EKG  EKG Interpretation None       Radiology No results found.  Procedures  Procedures (including critical care time)  Medications Ordered in UC Medications - No data to display   Initial Impression / Assessment and Plan / UC Course  I have reviewed the triage vital signs and the  nursing notes.  Pertinent labs & imaging results that were available during my care of the patient were reviewed by me and considered in my medical decision making (see chart for details).    Discussed no need for imaging at this time. Recommended to give herself time with rest and pain management and pain likely will improve in 1-2 weeks. Recommended ice and heat along with naprosyn. Patient agreeable to this plan.  Patient was refilled on Celexa, advised to get set up with PCP for refills. She received info at last visit for this and verbalized she still had the information.   Final Clinical Impressions(s) / UC Diagnoses   Final diagnoses:  Right leg pain    ED Discharge Orders        Ordered    naproxen (NAPROSYN) 375 MG tablet  2 times daily     09/06/17 1105    citalopram (CELEXA) 20 MG tablet  Daily     09/06/17 1105       Controlled Substance Prescriptions [place] Controlled Substance Registry consulted? Not Applicable   Lew DawesWieters, Trashawn Oquendo C, [place] JerseyPA-C 09/06/17 1122

## 2017-09-06 NOTE — ED Triage Notes (Signed)
Per pt right leg pain. Per pt it started on Saturday. Per pt when she do not move it's fine but when she starts walking it starts to hurt..../or

## 2017-11-05 ENCOUNTER — Encounter: Payer: Self-pay | Admitting: Nurse Practitioner

## 2017-11-05 ENCOUNTER — Ambulatory Visit: Payer: Medicaid Other | Attending: Nurse Practitioner | Admitting: Nurse Practitioner

## 2017-11-05 VITALS — BP 124/88 | HR 83 | Temp 98.6°F | Ht 70.0 in | Wt 270.0 lb

## 2017-11-05 DIAGNOSIS — Z79899 Other long term (current) drug therapy: Secondary | ICD-10-CM | POA: Diagnosis not present

## 2017-11-05 DIAGNOSIS — F319 Bipolar disorder, unspecified: Secondary | ICD-10-CM | POA: Diagnosis not present

## 2017-11-05 DIAGNOSIS — Z30433 Encounter for removal and reinsertion of intrauterine contraceptive device: Secondary | ICD-10-CM | POA: Diagnosis not present

## 2017-11-05 DIAGNOSIS — F3181 Bipolar II disorder: Secondary | ICD-10-CM | POA: Diagnosis not present

## 2017-11-05 DIAGNOSIS — Z8249 Family history of ischemic heart disease and other diseases of the circulatory system: Secondary | ICD-10-CM | POA: Diagnosis not present

## 2017-11-05 DIAGNOSIS — I10 Essential (primary) hypertension: Secondary | ICD-10-CM | POA: Insufficient documentation

## 2017-11-05 DIAGNOSIS — F419 Anxiety disorder, unspecified: Secondary | ICD-10-CM | POA: Diagnosis not present

## 2017-11-05 DIAGNOSIS — Z87820 Personal history of traumatic brain injury: Secondary | ICD-10-CM | POA: Diagnosis not present

## 2017-11-05 DIAGNOSIS — Z818 Family history of other mental and behavioral disorders: Secondary | ICD-10-CM | POA: Insufficient documentation

## 2017-11-05 MED ORDER — CITALOPRAM HYDROBROMIDE 40 MG PO TABS: 40.0000 mg | ORAL_TABLET | Freq: Every day | ORAL | 2 refills | Status: DC
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
## 2017-11-05 NOTE — Patient Instructions (Addendum)
Health Maintenance, Female Adopting a healthy lifestyle and getting preventive care can go a long way to promote health and wellness. Talk with your health care provider about what schedule of regular examinations is right for you. This is a good chance for you to check in with your provider about disease prevention and staying healthy. In between checkups, there are plenty of things you can do on your own. Experts have done a lot of research about which lifestyle changes and preventive measures are most likely to keep you healthy. Ask your health care provider for more information. Weight and diet Eat a healthy diet  Be sure to include plenty of vegetables, fruits, low-fat dairy products, and lean protein.  Do not eat a lot of foods high in solid fats, added sugars, or salt.  Get regular exercise. This is one of the most important things you can do for your health. ? Most adults should exercise for at least 150 minutes each week. The exercise should increase your heart rate and make you sweat (moderate-intensity exercise). ? Most adults should also do strengthening exercises at least twice a week. This is in addition to the moderate-intensity exercise.  Maintain a healthy weight  Body mass index (BMI) is a measurement that can be used to identify possible weight problems. It estimates body fat based on height and weight. Your health care provider can help determine your BMI and help you achieve or maintain a healthy weight.  For females 20 years of age and older: ? A BMI below 18.5 is considered underweight. ? A BMI of 18.5 to 24.9 is normal. ? A BMI of 25 to 29.9 is considered overweight. ? A BMI of 30 and above is considered obese.  Watch levels of cholesterol and blood lipids  You should start having your blood tested for lipids and cholesterol at 40 years of age, then have this test every 5 years.  You may need to have your cholesterol levels checked more often if: ? Your lipid or  cholesterol levels are high. ? You are older than 40 years of age. ? You are at high risk for heart disease.  Cancer screening Lung Cancer  Lung cancer screening is recommended for adults 55-80 years old who are at high risk for lung cancer because of a history of smoking.  A yearly low-dose CT scan of the lungs is recommended for people who: ? Currently smoke. ? Have quit within the past 15 years. ? Have at least a 30-pack-year history of smoking. A pack year is smoking an average of one pack of cigarettes a day for 1 year.  Yearly screening should continue until it has been 15 years since you quit.  Yearly screening should stop if you develop a health problem that would prevent you from having lung cancer treatment.  Breast Cancer  Practice breast self-awareness. This means understanding how your breasts normally appear and feel.  It also means doing regular breast self-exams. Let your health care provider know about any changes, no matter how small.  If you are in your 20s or 30s, you should have a clinical breast exam (CBE) by a health care provider every 1-3 years as part of a regular health exam.  If you are 40 or older, have a CBE every year. Also consider having a breast X-ray (mammogram) every year.  If you have a family history of breast cancer, talk to your health care provider about genetic screening.  If you are at high risk   for breast cancer, talk to your health care provider about having an MRI and a mammogram every year.  Breast cancer gene (BRCA) assessment is recommended for women who have family members with BRCA-related cancers. BRCA-related cancers include: ? Breast. ? Ovarian. ? Tubal. ? Peritoneal cancers.  Results of the assessment will determine the need for genetic counseling and BRCA1 and BRCA2 testing.  Cervical Cancer Your health care provider may recommend that you be screened regularly for cancer of the pelvic organs (ovaries, uterus, and  vagina). This screening involves a pelvic examination, including checking for microscopic changes to the surface of your cervix (Pap test). You may be encouraged to have this screening done every 3 years, beginning at age 22.  For women ages 56-65, health care providers may recommend pelvic exams and Pap testing every 3 years, or they may recommend the Pap and pelvic exam, combined with testing for human papilloma virus (HPV), every 5 years. Some types of HPV increase your risk of cervical cancer. Testing for HPV may also be done on women of any age with unclear Pap test results.  Other health care providers may not recommend any screening for nonpregnant women who are considered low risk for pelvic cancer and who do not have symptoms. Ask your health care provider if a screening pelvic exam is right for you.  If you have had past treatment for cervical cancer or a condition that could lead to cancer, you need Pap tests and screening for cancer for at least 20 years after your treatment. If Pap tests have been discontinued, your risk factors (such as having a new sexual partner) need to be reassessed to determine if screening should resume. Some women have medical problems that increase the chance of getting cervical cancer. In these cases, your health care provider may recommend more frequent screening and Pap tests.  Colorectal Cancer  This type of cancer can be detected and often prevented.  Routine colorectal cancer screening usually begins at 40 years of age and continues through 40 years of age.  Your health care provider may recommend screening at an earlier age if you have risk factors for colon cancer.  Your health care provider may also recommend using home test kits to check for hidden blood in the stool.  A small camera at the end of a tube can be used to examine your colon directly (sigmoidoscopy or colonoscopy). This is done to check for the earliest forms of colorectal  cancer.  Routine screening usually begins at age 33.  Direct examination of the colon should be repeated every 5-10 years through 40 years of age. However, you may need to be screened more often if early forms of precancerous polyps or small growths are found.  Skin Cancer  Check your skin from head to toe regularly.  Tell your health care provider about any new moles or changes in moles, especially if there is a change in a mole's shape or color.  Also tell your health care provider if you have a mole that is larger than the size of a pencil eraser.  Always use sunscreen. Apply sunscreen liberally and repeatedly throughout the day.  Protect yourself by wearing long sleeves, pants, a wide-brimmed hat, and sunglasses whenever you are outside.  Heart disease, diabetes, and high blood pressure  High blood pressure causes heart disease and increases the risk of stroke. High blood pressure is more likely to develop in: ? People who have blood pressure in the high end of  the normal range (130-139/85-89 mm Hg). ? People who are overweight or obese. ? People who are African American.  If you are 21-29 years of age, have your blood pressure checked every 3-5 years. If you are 3 years of age or older, have your blood pressure checked every year. You should have your blood pressure measured twice-once when you are at a hospital or clinic, and once when you are not at a hospital or clinic. Record the average of the two measurements. To check your blood pressure when you are not at a hospital or clinic, you can use: ? An automated blood pressure machine at a pharmacy. ? A home blood pressure monitor.  If you are between 17 years and 37 years old, ask your health care provider if you should take aspirin to prevent strokes.  Have regular diabetes screenings. This involves taking a blood sample to check your fasting blood sugar level. ? If you are at a normal weight and have a low risk for diabetes,  have this test once every three years after 40 years of age. ? If you are overweight and have a high risk for diabetes, consider being tested at a younger age or more often. Preventing infection Hepatitis B  If you have a higher risk for hepatitis B, you should be screened for this virus. You are considered at high risk for hepatitis B if: ? You were born in a country where hepatitis B is common. Ask your health care provider which countries are considered high risk. ? Your parents were born in a high-risk country, and you have not been immunized against hepatitis B (hepatitis B vaccine). ? You have HIV or AIDS. ? You use needles to inject street drugs. ? You live with someone who has hepatitis B. ? You have had sex with someone who has hepatitis B. ? You get hemodialysis treatment. ? You take certain medicines for conditions, including cancer, organ transplantation, and autoimmune conditions.  Hepatitis C  Blood testing is recommended for: ? Everyone born from [birth year] through 1963. ? Anyone with known risk factors for hepatitis C.  Sexually transmitted infections (STIs)  You should be screened for sexually transmitted infections (STIs) including gonorrhea and chlamydia if: ? You are sexually active and are younger than 40 years of age. ? You are older than 40 years of age and your health care provider tells you that you are at risk for this type of infection. ? Your sexual activity has changed since you were last screened and you are at an increased risk for chlamydia or gonorrhea. Ask your health care provider if you are at risk.  If you do not have HIV, but are at risk, it may be recommended that you take a prescription medicine daily to prevent HIV infection. This is called pre-exposure prophylaxis (PrEP). You are considered at risk if: ? You are sexually active and do not regularly use condoms or know the HIV status of your partner(s). ? You take drugs by injection. ? You are  sexually active with a partner who has HIV.  Talk with your health care provider about whether you are at high risk of being infected with HIV. If you choose to begin PrEP, you should first be tested for HIV. You should then be tested every 3 months for as long as you are taking PrEP. Pregnancy  If you are premenopausal and you may become pregnant, ask your health care provider about preconception counseling.  If you may become  pregnant, take 400 to 800 micrograms (mcg) of folic acid every day.  If you want to prevent pregnancy, talk to your health care provider about birth control (contraception). Osteoporosis and menopause  Osteoporosis is a disease in which the bones lose minerals and strength with aging. This can result in serious bone fractures. Your risk for osteoporosis can be identified using a bone density scan.  If you are 83 years of age or older, or if you are at risk for osteoporosis and fractures, ask your health care provider if you should be screened.  Ask your health care provider whether you should take a calcium or vitamin D supplement to lower your risk for osteoporosis.  Menopause may have certain physical symptoms and risks.  Hormone replacement therapy may reduce some of these symptoms and risks. Talk to your health care provider about whether hormone replacement therapy is right for you. Follow these instructions at home:  Schedule regular health, dental, and eye exams.  Stay current with your immunizations.  Do not use any tobacco products including cigarettes, chewing tobacco, or electronic cigarettes.  If you are pregnant, do not drink alcohol.  If you are breastfeeding, limit how much and how often you drink alcohol.  Limit alcohol intake to no more than 1 drink per day for nonpregnant women. One drink equals 12 ounces of beer, 5 ounces of wine, or 1 ounces of hard liquor.  Do not use street drugs.  Do not share needles.  Ask your health care  provider for help if you need support or information about quitting drugs.  Tell your health care provider if you often feel depressed.  Tell your health care provider if you have ever been abused or do not feel safe at home. This information is not intended to replace advice given to you by your health care provider. Make sure you discuss any questions you have with your health care provider. Document Released: 04/13/2011 Document Revised: 03/05/2016 Document Reviewed: 07/02/2015 Elsevier Interactive Patient Education  2018 [employer].  Bipolar 2 Disorder Bipolar 2 disorder is a mental health disorder in which a person has episodes of emotional highs (mania) and lows (depression). Bipolar 2 is different from other bipolar disorders because the manic episodes are not as high and do not last as long. This is called hypomania. People with bipolar 2 disorder usually go back and forth between hypomanic and depressive episodes. What are the causes? The cause of this condition is not known. What increases the risk? The following factors may make you more likely to develop this condition:  Having a family member with the disorder.  An imbalance of certain chemicals in the brain (neurotransmitters).  Stress, such as a death, illness, or financial problems.  Certain conditions that affect the brain or spinal cord (neurologic conditions).  Brain injury (trauma).  Having another mental health disorder, such as: ? Obsessive compulsive disorder. ? Schizophrenia.  What are the signs or symptoms? Symptoms of hypomania include:  Very high self-esteem or self-confidence.  Decreased need for sleep.  Unusual talkativeness or feeling a need to keep talking. Speech may be very fast. It may seem like you cannot stop talking.  Racing thoughts or constant talking, with quick shifts between topics that may or may not be related (flight of ideas).  Decreased ability to focus or  concentrate.  Increased purposeful activity, such as work, studies, or social activity.  Increased nonproductive activity. This could be pacing, squirming and fidgeting, or finger and toe tapping.  Impulsive behavior and poor judgment. This may result in high-risk activities, such as having unprotected sex or spending a lot of money.  Symptoms of depression include:  Feeling sad, hopeless, or helpless.  Frequent or uncontrollable crying.  Lack of feeling or caring about anything.  Sleeping too much.  Moving more slowly than usual.  Not being able to enjoy things you used to enjoy.  Desire to be alone all the time.  Feeling guilty or worthless.  Lack of energy or motivation.  Trouble concentrating or remembering.  Trouble making decisions.  Increased appetite.  Thoughts of death or desire to harm yourself.  How is this diagnosed? To diagnose bipolar 2 disorder, your health care provider may ask about your:  Emotional episodes.  Medical history.  Alcohol and drug use. This includes prescription medicines. Certain medical conditions and substances can cause symptoms that seem like bipolar disorder (secondary bipolar disorder).  How is this treated? Bipolar 2 disorder is a long-term (chronic) illness. It is best controlled with ongoing (continuous) treatment rather than being treated only when symptoms occur. Treatment may include:  Psychotherapy. Some forms of talk therapy, such as cognitive-behavioral therapy (CBT), can provide support, education, and guidance.  Coping strategies, such as journaling or relaxation exercises. Relaxation exercises include: ? Yoga. ? Meditation. ? Deep breathing.  Lifestyle changes, such as: ? Limiting alcohol and drug use. ? Exercising regularly. ? Getting plenty of sleep. ? Making healthy eating choices.  Medicine. Medicine can be prescribed by a health care provider who specializes in treating mental disorders  (psychiatrist). ? Medicines called mood stabilizers are usually prescribed. ? If symptoms occur even while taking a mood stabilizer, other medicines may be added.  A combination of medicine, talk therapy, and coping methods is the best way to treat this condition. Follow these instructions at home: Activity  Return to your normal activities as told by your health care provider.  Find activities that you enjoy, and make time to do them.  Exercise regularly as told by your health care provider. Lifestyle  Limit alcohol intake to no more than 1 drink a day for nonpregnant women and 2 drinks a day for men. One drink equals 12 oz of beer, 5 oz of wine, or 1 oz of hard liquor.  Follow a set schedule for eating and sleeping.  Eat a balanced diet that includes fresh fruits and vegetables, whole grains, low-fat dairy, and lean meats.  Get at least 7-8 hours of sleep each night. General instructions  Take over-the-counter and prescription medicines only as told by your health care provider.  Think about joining a support group. Your health care provider may be able to recommend a support group.  Talk with your family and loved ones about your treatment goals and how they can help.  Keep all follow-up visits as told by your health care provider. This is important. Where to find more information: For more information about bipolar 2 disorder, visit the following websites:  [employer] on Mental Illness: www.nami.[place]: https://carter.com/  Contact a health care provider if:  Your symptoms get worse.  You have side effects from your medicine, and they get worse.  You have trouble sleeping.  You have trouble doing daily activities.  You feel unsafe in your surroundings.  You are dealing with substance abuse. Get help right away if:  You have new symptoms.  You have thoughts about harming yourself or others.  You harm  yourself. Summary  Bipolar 2 disorder is a mental health disorder in which a person has episodes of hypomania and depression.  Bipolar 2 is best treated through a combination of medicines, talk therapy, and coping strategies.  Talk with your family and loved ones about your treatment goals and how they can help. This information is not intended to replace advice given to you by your health care provider. Make sure you discuss any questions you have with your health care provider. Document Released: 11/03/2016 Document Revised: 11/03/2016 Document Reviewed: 11/03/2016 Elsevier Interactive Patient Education  2018 [place] is a condition that affects people who have certain types of bipolar disorder. It is a mild form of mania. Mania is when a person has episodes of increased energy, excitement, or irritability that affect his or her daily life. Unlike mania, hypomania does not affect daily life and it may only last for a few days. If hypomania is not treated, it can become more severe and lead to mania or depression. What are the causes? The cause of this condition is not known. What increases the risk? You are more likely to develop hypomania if you have a mood disorder, especially bipolar disorder. If you have a mood disorder, the following factors may increase your risk of developing hypomania:  Not getting enough sleep.  Using substances such as tobacco, caffeine, or illegal drugs.  Certain prescription medicines, such as antidepressants or antibiotics.  Stress or emotional events.  Certain seasons. Hypomania is more common in spring and summer.  The period of time after having a baby (postpartum period).  What are the signs or symptoms? Symptoms of this condition include:  Very high energy or restlessness.  Decreased need for sleep.  Very high self-esteem or self-confidence.  Being unusually talkative, or feeling a need to keep talking. Speech  may be very fast. It may seem like you cannot stop talking.  Racing thoughts or constant talking, with quick shifts between topics that may or may not be related (flight of ideas).  Decreased ability to focus or concentrate.  Increased purposeful activity, such as work, study, or social activity.  Increased nonproductive activity. This could be pacing, squirming and fidgeting, or finger and toe tapping.  Being more irritable.  Impulsive behavior and poor judgment. This may result in high-risk activities, such as having unprotected sex or spending a lot of money.  How is this diagnosed? This condition may be diagnosed based on:  Your symptoms and medical history.  A physical exam. Your health care provider will check for physical conditions that may be causing your symptoms.  A mental health evaluation. You may be referred to a mental health provider who specializes in diagnosing and treating mood disorders.  How is this treated? This condition may be treated with:  Medicines, such as mood stabilizers. Your health care provider may also recommend medicines to help you sleep, if needed.  Talk therapy (psychotherapy) with a mental health provider.  Follow these instructions at home:  Take over-the-counter and prescription medicines only as told by your health care provider.  Consider keeping a journal. Write down a daily log of your mood changes, sleep habits, life events, and medicines. Share this information with your health care provider or therapist.  Find a supportive relative or friend who can help you manage your symptoms.  Keep all follow-up visits as told by your health care provider. This is important. Contact a health care provider if:  Your symptoms do not improve or they  get worse.  You are thinking about changing your medicine or treatment program.  You have side effects from your prescription medicines. Get help right away if:  You are having a mental  health crisis.  You are thinking about harming yourself or thinking about suicide. If you ever feel like you may hurt yourself or others, or have thoughts about taking your own life, get help right away. You can go to your nearest emergency department or call:  Your local emergency services (911 in the [place]  A suicide crisis helpline, such as the [place] at 239-139-6259. This is open 24 hours a day.  Summary  Hypomania is a mild form of mania. Mania is when someone feels increased energy, excitement, or irritability that affects his or her daily life. Hypomania does not affect daily life, and it may only last for a few days.  Symptoms of hypomania may include very high energy or restlessness, unusual talkativeness, and not being able to focus or concentrate.  Treatment for hypomania may include mood stabilizer medicine and talk therapy (psychotherapy) with a mental health provider. This information is not intended to replace advice given to you by your health care provider. Make sure you discuss any questions you have with your health care provider. Document Released: 01/22/2017 Document Revised: 01/22/2017 Document Reviewed: 01/22/2017 Elsevier Interactive Patient Education  [employer].

## 2017-11-08 ENCOUNTER — Encounter: Payer: Self-pay | Admitting: Nurse Practitioner

## 2017-11-08 DIAGNOSIS — F419 Anxiety disorder, unspecified: Secondary | ICD-10-CM | POA: Insufficient documentation

## 2017-11-19 ENCOUNTER — Other Ambulatory Visit (HOSPITAL_COMMUNITY)
Admission: RE | Admit: 2017-11-19 | Discharge: 2017-11-19 | Disposition: A | Discharge status: Home / Self Care | Payer: Medicaid Other | Source: Ambulatory Visit | Attending: Nurse Practitioner | Admitting: Nurse Practitioner

## 2017-11-19 ENCOUNTER — Encounter: Payer: Self-pay | Admitting: Nurse Practitioner

## 2017-11-19 ENCOUNTER — Ambulatory Visit: Payer: Medicaid Other | Attending: Nurse Practitioner | Admitting: Nurse Practitioner

## 2017-11-19 VITALS — BP 128/79 | HR 78 | Temp 97.8°F | Ht 70.0 in | Wt 262.6 lb

## 2017-11-19 DIAGNOSIS — I10 Essential (primary) hypertension: Secondary | ICD-10-CM | POA: Insufficient documentation

## 2017-11-19 DIAGNOSIS — F419 Anxiety disorder, unspecified: Secondary | ICD-10-CM | POA: Insufficient documentation

## 2017-11-19 DIAGNOSIS — Z791 Long term (current) use of non-steroidal anti-inflammatories (NSAID): Secondary | ICD-10-CM | POA: Insufficient documentation

## 2017-11-19 DIAGNOSIS — E669 Obesity, unspecified: Secondary | ICD-10-CM

## 2017-11-19 DIAGNOSIS — F319 Bipolar disorder, unspecified: Secondary | ICD-10-CM | POA: Insufficient documentation

## 2017-11-19 DIAGNOSIS — M25561 Pain in right knee: Secondary | ICD-10-CM | POA: Insufficient documentation

## 2017-11-19 DIAGNOSIS — Z6830 Body mass index (BMI) 30.0-30.9, adult: Secondary | ICD-10-CM | POA: Insufficient documentation

## 2017-11-19 DIAGNOSIS — Z0000 Encounter for general adult medical examination without abnormal findings: Secondary | ICD-10-CM | POA: Insufficient documentation

## 2017-11-19 DIAGNOSIS — Z79899 Other long term (current) drug therapy: Secondary | ICD-10-CM | POA: Diagnosis not present

## 2017-11-19 MED ORDER — MELOXICAM 15 MG PO TABS: 15.0000 mg | ORAL_TABLET | Freq: Every day | ORAL | 1 refills | Status: DC

## 2017-11-19 NOTE — Progress Notes (Addendum)
Assessment & Plan:  Sarah Salas was seen today for annual exam and knee pain.  Diagnoses and all orders for this visit:  Encounter for annual physical exam -     Basic metabolic panel -     CBC -     Cytology - PAP -     Cervicovaginal ancillary only  Obesity (BMI 30-39.9) -     Lipid panel -     TSH Discussed diet and exercise for person with BMI >25. Instructed: You must burn more calories than you eat. Losing 5 percent of your body weight should be considered a success. In the longer term, losing more than 15 percent of your body weight and staying at this weight is an extremely good result. However, keep in mind that even losing 5 percent of your body weight leads to important health benefits, so try not to get discouraged if you're not able to lose more than this. Will recheck weight in 3-6 months.  Acute pain of right knee -     meloxicam (MOBIC) 15 MG tablet; Take 1 tablet (15 mg total) by mouth daily. May alternate with tylenol for pain relief. May apply heat to affected area for pain relief Also losing weight can help reduce the pain from OA    Patient has been counseled on age-appropriate routine health concerns for screening and prevention. These are reviewed and up-to-date. Referrals have been placed accordingly. Immunizations are up-to-date or declined.    Subjective:   Chief Complaint  Patient presents with  . Annual Exam    Patient is here for a physical.  . Knee Pain    Patient stated she have bad right knee pain and would like to have Rx for it.    HPI Sarah Salas 42 y.o. female presents to office today for annual wellness exam.  Knee Pain Patient presents with knee pain involving the  right knee. Onset of the symptoms was several weeks ago. Inciting event: none known. Current symptoms include crepitus sensation, pain located near the tibial tuberosity and swellingPain is aggravated by any weight bearing.  Patient has had prior knee problems. Evaluation to  date: none. Treatment to date: avoidance of offending activity and prescription NSAIDS which are effective. She has been using her father's meloxicam 15mg  daily.  Anxiety She was started on celexa almost 2 weeks ago. Notes some improvement. I would like to wait a few more weeks to see if it will be effective before I switch. She is asking about zoloft which her mother takes but agrees to celexa for a few more weeks.   Review of Systems  Constitutional: Negative.  Negative for chills, fever, malaise/fatigue and weight loss.  HENT: Negative.  Negative for congestion, hearing loss, sinus pain and sore throat.   Eyes: Negative.  Negative for blurred vision, double vision, photophobia and pain.  Respiratory: Negative.  Negative for cough, sputum production, shortness of breath and wheezing.   Cardiovascular: Negative.  Negative for chest pain and leg swelling.  Gastrointestinal: Negative.  Negative for abdominal pain, constipation, diarrhea, heartburn, nausea and vomiting.  Genitourinary: Negative.   Musculoskeletal: Positive for joint pain. Negative for myalgias.       See HPI  Skin: Negative.  Negative for rash.  Neurological: Negative.  Negative for dizziness, tremors, speech change, focal weakness, seizures and headaches.  Endo/Heme/Allergies: Negative.  Negative for environmental allergies.  Psychiatric/Behavioral: Negative.  Negative for depression and suicidal ideas. The patient is not nervous/anxious and does not have insomnia.  Past Medical History:  Diagnosis Date  . Anxiety   . Bipolar 1 disorder (HCC)   . Brain injury (HCC)   . Hypertension     Past Surgical History:  Procedure Laterality Date  . BRAIN SURGERY      Family History  Problem Relation Age of Onset  . Bipolar disorder Mother   . Hypertension Mother   . Bipolar disorder Sister     Social History Reviewed with no changes to be made today.   Outpatient Medications Prior to Visit  Medication Sig Dispense  Refill  . acetaminophen (TYLENOL) 500 MG tablet Take 500 mg by mouth 4 (four) times daily.    . citalopram (CELEXA) 40 MG tablet Take 1 tablet (40 mg total) by mouth daily. 90 tablet 2  . ipratropium (ATROVENT) 0.06 % nasal spray Place 2 sprays into both nostrils 4 (four) times daily. (Patient not taking: Reported on 11/19/2017) 15 mL 1   No facility-administered medications prior to visit.     No Known Allergies     Objective:    BP 128/79 (BP Location: Left Arm, Patient Position: Sitting, Cuff Size: Normal)   Pulse 78   Temp 97.8 F (36.6 C) (Oral)   Ht 5\' 10"  (1.778 m)   Wt 262 lb 9.6 oz (119.1 kg)   SpO2 99%   BMI 37.68 kg/m  Wt Readings from Last 3 Encounters:  11/19/17 262 lb 9.6 oz (119.1 kg)  11/05/17 270 lb (122.5 kg)  08/11/17 250 lb (113.4 kg)    Physical Exam  Constitutional: She is oriented to person, place, and time. She appears well-developed and well-nourished. No distress.  HENT:  Head: Normocephalic and atraumatic.  Right Ear: External ear normal.  Left Ear: External ear normal.  Nose: Nose normal.  Mouth/Throat: Oropharynx is clear and moist. No oropharyngeal exudate.  Eyes: Conjunctivae and EOM are normal. Pupils are equal, round, and reactive to light. Right eye exhibits no discharge. Left eye exhibits no discharge. No scleral icterus.  Neck: Normal range of motion. Neck supple. No tracheal deviation present. No thyromegaly present.  Cardiovascular: Normal rate, regular rhythm, normal heart sounds and intact distal pulses. Exam reveals no friction rub.  No murmur heard. Pulmonary/Chest: Effort normal and breath sounds normal. No respiratory distress. She has no decreased breath sounds. She has no wheezes. She has no rhonchi. She has no rales. She exhibits no tenderness. Right breast exhibits inverted nipple. Right breast exhibits no mass, no nipple discharge, no skin change and no tenderness. Left breast exhibits inverted nipple. Left breast exhibits no  mass, no nipple discharge, no skin change and no tenderness.  Abdominal: Soft. Bowel sounds are normal. She exhibits no distension and no mass. There is no tenderness. There is no rebound and no guarding. Hernia confirmed negative in the right inguinal area and confirmed negative in the left inguinal area.  Genitourinary: Vagina normal. Rectal exam shows no external hemorrhoid, no fissure, no mass and anal tone normal. No breast swelling, tenderness, discharge or bleeding. No labial fusion. There is no rash, tenderness, lesion or injury on the right labia. There is no rash, tenderness, lesion or injury on the left labia. Uterus is not tender. Cervix exhibits discharge. Cervix exhibits no motion tenderness and no friability. Right adnexum displays no mass, no tenderness and no fullness. Left adnexum displays no mass, no tenderness and no fullness. No erythema, tenderness or bleeding in the vagina. No foreign body in the vagina. No signs of injury around the vagina.  No vaginal discharge found.  Musculoskeletal: Normal range of motion. She exhibits no edema, tenderness or deformity.  Lymphadenopathy:    She has no cervical adenopathy.       Right: No inguinal adenopathy present.       Left: No inguinal adenopathy present.  Neurological: She is alert and oriented to person, place, and time. No cranial nerve deficit. Coordination normal.  Skin: Skin is warm and dry. No rash noted. She is not diaphoretic. No erythema. No pallor.  Heels of bilateral feet are very calloused and several bilateral toes with onychomycosis.    Psychiatric: She has a normal mood and affect. Her behavior is normal. Judgment and thought content normal.       Patient has been counseled extensively about nutrition and exercise as well as the importance of adherence with medications and regular follow-up. The patient was given clear instructions to go to ER or return to medical center if symptoms don't improve, worsen or new problems  develop. The patient verbalized understanding.   Follow-up: Return in about 4 weeks (around 12/17/2017) for anxiety.   Claiborne Rigg, FNP-BC [facility] and [facility] [place], [place] [phone]   11/19/2017, 10:19 AM

## 2017-11-20 LAB — CBC
HEMATOCRIT: 40.9 % (ref 34.0–46.6)
Hemoglobin: 13.7 g/dL (ref 11.1–15.9)
MCH: 29.8 pg (ref 26.6–33.0)
MCHC: 33.5 g/dL (ref 31.5–35.7)
MCV: 89 fL (ref 79–97)
Platelets: 351 10*3/uL (ref 150–379)
RBC: 4.59 x10E6/uL (ref 3.77–5.28)
RDW: 14.2 % (ref 12.3–15.4)
WBC: 5.7 10*3/uL (ref 3.4–10.8)

## 2017-11-20 LAB — LIPID PANEL
CHOL/HDL RATIO: 5.1 ratio — AB (ref 0.0–4.4)
Cholesterol, Total: 209 mg/dL — ABNORMAL HIGH (ref 100–199)
HDL: 41 mg/dL (ref >39)
LDL CALC: 134 mg/dL — AB (ref 0–99)
Triglycerides: 171 mg/dL — ABNORMAL HIGH (ref 0–149)
VLDL Cholesterol Cal: 34 mg/dL (ref 5–40)

## 2017-11-20 LAB — TSH: TSH: 1.52 u[IU]/mL (ref 0.450–4.500)

## 2017-11-20 LAB — BASIC METABOLIC PANEL
BUN / CREAT RATIO: 15 (ref 9–23)
BUN: 9 mg/dL (ref 6–20)
CO2: 21 mmol/L (ref 20–29)
CREATININE: 0.59 mg/dL (ref 0.57–1.00)
Calcium: 9.5 mg/dL (ref 8.7–10.2)
Chloride: 104 mmol/L (ref 96–106)
GFR calc Af Amer: 134 mL/min/{1.73_m2} (ref >59)
GFR calc non Af Amer: 116 mL/min/{1.73_m2} (ref >59)
GLUCOSE: 87 mg/dL (ref 65–99)
Potassium: 4.2 mmol/L (ref 3.5–5.2)
Sodium: 144 mmol/L (ref 134–144)

## 2017-11-22 LAB — CERVICOVAGINAL ANCILLARY ONLY
Bacterial vaginitis: NEGATIVE
Candida vaginitis: NEGATIVE
Chlamydia: NEGATIVE
NEISSERIA GONORRHEA: NEGATIVE
TRICH (WINDOWPATH): NEGATIVE

## 2017-11-22 LAB — CYTOLOGY - PAP: Diagnosis: NEGATIVE

## 2017-11-23 ENCOUNTER — Telehealth: Payer: Self-pay

## 2017-11-23 NOTE — Telephone Encounter (Signed)
-----   Message from Claiborne RiggZelda W Fleming, NP sent at 11/22/2017 11:03 PM EST ----- PAP smear is normal. Thyroid level is normal. Tests show increased cholesterol/triglycerides/LDL  levels.  Patient should work on a low fat, heart healthy diet and participate in regular aerobic exercise program to control as well by working out at least150 minutes per week. No fried foods. No junk foods, sodas, sugary drinks, unhealthy snacking, or smoking.

## 2017-11-23 NOTE — Telephone Encounter (Signed)
CMA attempt to call patient and patient did not answer.  CMA left a voicemail to call back.   If patient call please let her know:   !!PAP smear is normal. Thyroid level is normal. Tests show increased cholesterol/triglycerides/LDL  levels.  Patient should work on a low fat, heart healthy diet and participate in regular aerobic exercise program to control as well by working out at least150 minutes per week. No fried foods. No junk foods, sodas, sugary drinks, unhealthy snacking, or smoking.!!

## 2017-11-24 ENCOUNTER — Telehealth: Payer: Self-pay | Admitting: Nurse Practitioner

## 2017-11-24 NOTE — Telephone Encounter (Signed)
Patient called and In informed the patient that PAP smear is normal. Thyroid level is normal. Tests show increased cholesterol/triglycerides/LDL levels. Patient should work on a low fat, heart healthy diet and participate in regular aerobic exercise program to control as well by working out at least 150 minutes per week. No fried foods. No junk foods, sodas, sugary drinks, unhealthy snacking, or smoking

## 2017-12-15 ENCOUNTER — Ambulatory Visit: Payer: Medicaid Other | Attending: Nurse Practitioner | Admitting: Nurse Practitioner

## 2017-12-15 ENCOUNTER — Encounter: Payer: Self-pay | Admitting: Nurse Practitioner

## 2017-12-15 VITALS — BP 114/77 | HR 76 | Temp 98.2°F | Ht 70.0 in | Wt 263.8 lb

## 2017-12-15 DIAGNOSIS — Z760 Encounter for issue of repeat prescription: Secondary | ICD-10-CM | POA: Insufficient documentation

## 2017-12-15 DIAGNOSIS — Z794 Long term (current) use of insulin: Secondary | ICD-10-CM | POA: Insufficient documentation

## 2017-12-15 DIAGNOSIS — F909 Attention-deficit hyperactivity disorder, unspecified type: Secondary | ICD-10-CM | POA: Insufficient documentation

## 2017-12-15 DIAGNOSIS — Z818 Family history of other mental and behavioral disorders: Secondary | ICD-10-CM | POA: Insufficient documentation

## 2017-12-15 DIAGNOSIS — Z79899 Other long term (current) drug therapy: Secondary | ICD-10-CM | POA: Diagnosis not present

## 2017-12-15 DIAGNOSIS — M25561 Pain in right knee: Secondary | ICD-10-CM | POA: Diagnosis not present

## 2017-12-15 DIAGNOSIS — Z791 Long term (current) use of non-steroidal anti-inflammatories (NSAID): Secondary | ICD-10-CM | POA: Insufficient documentation

## 2017-12-15 DIAGNOSIS — Z8249 Family history of ischemic heart disease and other diseases of the circulatory system: Secondary | ICD-10-CM | POA: Insufficient documentation

## 2017-12-15 DIAGNOSIS — F319 Bipolar disorder, unspecified: Secondary | ICD-10-CM | POA: Insufficient documentation

## 2017-12-15 DIAGNOSIS — I10 Essential (primary) hypertension: Secondary | ICD-10-CM | POA: Diagnosis not present

## 2017-12-15 DIAGNOSIS — F419 Anxiety disorder, unspecified: Secondary | ICD-10-CM

## 2017-12-15 MED ORDER — CITALOPRAM HYDROBROMIDE 40 MG PO TABS: 40.0000 mg | ORAL_TABLET | Freq: Every day | ORAL | 2 refills | Status: DC

## 2017-12-15 MED ORDER — MELOXICAM 15 MG PO TABS: 15.0000 mg | ORAL_TABLET | Freq: Every day | ORAL | 2 refills | Status: DC

## 2017-12-15 NOTE — Progress Notes (Signed)
Assessment & Plan:  Sarah Salas was seen today for follow-up and medication refill.  Diagnoses and all orders for this visit:  Anxiety -     citalopram (CELEXA) 40 MG tablet; Take 1 tablet (40 mg total) by mouth daily.  Hyperactive behavior -     Ambulatory referral to Psychiatry  Acute pain of right knee -     meloxicam (MOBIC) 15 MG tablet; Take 1 tablet (15 mg total) by mouth daily. Work on losing weight to help reduce joint pain. May alternate with heat and ice application for pain relief. May also alternate with acetaminophen as prescribed for relief of pain. Other alternatives include massage, acupuncture and water aerobics.  You must stay active and avoid a sedentary lifestyle.   Patient has been counseled on age-appropriate routine health concerns for screening and prevention. These are reviewed and up-to-date. Referrals have been placed accordingly. Immunizations are up-to-date or declined.    Subjective:   Chief Complaint  Patient presents with  . Follow-up    Patient is here for a follow-up on anxiety.   . Medication Refill    Pt's stated she would like to talk to PCP to lower her dose and need medication refills.    HPI Sarah Salas 42 y.o. female presents to office today for follow up of anxiety.   Anxiety She has a history of bipolar disorder and took adderall as a young adult. At her last office appointment in January 2019 her celexa was increased from 20mg  to 40mg . She reports today that her mood has greatly improved. She states her mother has been telling her that she is very hyper and needs to speak with her PCP about "taking a medication that can help calm me down". We discussed her history of bipolar disorder and possibly ADHD/ADD. I have suggested a psychiatry referral for more definitive testing as I am only comfortable with prescribing celexa at this time. She is agreeable. She has taken klonopin in the past as well but experienced bad side effects.   RIght Knee  Pain She was prescribed mobic 15mg  at her last office appointment for right knee pain. Today she endorses decreased swelling and pain relief with taking mobic. She requests a refill today. She is working on losing weight and practicing a GOLO diet which consists of a diet that reduces body insulin and limits glucose conversion into fats.   Review of Systems  Constitutional: Negative for fever, malaise/fatigue and weight loss.  Eyes: Negative.  Negative for blurred vision.  Respiratory: Negative.  Negative for cough and shortness of breath.   Cardiovascular: Negative.  Negative for chest pain, palpitations and leg swelling.  Musculoskeletal: Positive for joint pain. Negative for myalgias.       SEE HPI  Neurological: Negative.  Negative for dizziness, focal weakness, seizures and headaches.  Psychiatric/Behavioral: Negative for substance abuse and suicidal ideas. The patient is nervous/anxious.     Past Medical History:  Diagnosis Date  . Anxiety   . Bipolar 1 disorder (HCC)   . Brain injury (HCC)   . Hypertension     Past Surgical History:  Procedure Laterality Date  . BRAIN SURGERY      Family History  Problem Relation Age of Onset  . Bipolar disorder Mother   . Hypertension Mother   . Bipolar disorder Sister     Social History Reviewed with no changes to be made today.   Outpatient Medications Prior to Visit  Medication Sig Dispense Refill  . citalopram (  CELEXA) 40 MG tablet Take 1 tablet (40 mg total) by mouth daily. 90 tablet 2  . meloxicam (MOBIC) 15 MG tablet Take 1 tablet (15 mg total) by mouth daily. 30 tablet 1  . acetaminophen (TYLENOL) 500 MG tablet Take 500 mg by mouth 4 (four) times daily.    Marland Kitchen. ipratropium (ATROVENT) 0.06 % nasal spray Place 2 sprays into both nostrils 4 (four) times daily. (Patient not taking: Reported on 11/19/2017) 15 mL 1   No facility-administered medications prior to visit.     No Known Allergies     Objective:    BP 114/77 (BP  Location: Right Arm, Patient Position: Sitting, Cuff Size: Large)   Pulse 76   Temp 98.2 F (36.8 C) (Oral)   Ht 5\' 10"  (1.778 m)   Wt 263 lb 12.8 oz (119.7 kg)   SpO2 100%   BMI 37.85 kg/m  Wt Readings from Last 3 Encounters:  12/15/17 263 lb 12.8 oz (119.7 kg)  11/19/17 262 lb 9.6 oz (119.1 kg)  11/05/17 270 lb (122.5 kg)    Physical Exam  Constitutional: She is oriented to person, place, and time. She appears well-developed and well-nourished. She is cooperative.  HENT:  Head: Normocephalic and atraumatic.  Eyes: EOM are normal.  Neck: Normal range of motion.  Cardiovascular: Normal rate, regular rhythm and normal heart sounds. Exam reveals no gallop and no friction rub.  No murmur heard. Pulmonary/Chest: Effort normal and breath sounds normal. No tachypnea. No respiratory distress. She has no decreased breath sounds. She has no wheezes. She has no rhonchi. She has no rales. She exhibits no tenderness.  Abdominal: Soft. Bowel sounds are normal.  Musculoskeletal: Normal range of motion. She exhibits no edema.  Neurological: She is alert and oriented to person, place, and time. Coordination normal.  Skin: Skin is warm and dry.  Psychiatric: She has a normal mood and affect. Her speech is normal and behavior is normal. Judgment and thought content normal. Cognition and memory are normal. She expresses no homicidal and no suicidal ideation. She expresses no suicidal plans and no homicidal plans.  Nursing note and vitals reviewed.     Patient has been counseled extensively about nutrition and exercise as well as the importance of adherence with medications and regular follow-up. The patient was given clear instructions to go to ER or return to medical center if symptoms don't improve, worsen or new problems develop. The patient verbalized understanding.   Follow-up: Return if symptoms worsen or fail to improve.   Claiborne RiggZelda W Aubrina Nieman, FNP-BC [facility]  [place], [place] [phone]   12/15/2017, 2:19 PM

## 2017-12-15 NOTE — Patient Instructions (Addendum)
Generalized Anxiety Disorder, Adult Generalized anxiety disorder (GAD) is a mental health disorder. People with this condition constantly worry about everyday events. Unlike normal anxiety, worry related to GAD is not triggered by a specific event. These worries also do not fade or get better with time. GAD interferes with life functions, including relationships, work, and school. GAD can vary from mild to severe. People with severe GAD can have intense waves of anxiety with physical symptoms (panic attacks). What are the causes? The exact cause of GAD is not known. What increases the risk? This condition is more likely to develop in:  Women.  People who have a family history of anxiety disorders.  People who are very shy.  People who experience very stressful life events, such as the death of a loved one.  People who have a very stressful family environment.  What are the signs or symptoms? People with GAD often worry excessively about many things in their lives, such as their health and family. They may also be overly concerned about:  Doing well at work.  Being on time.  Natural disasters.  Friendships.  Physical symptoms of GAD include:  Fatigue.  Muscle tension or having muscle twitches.  Trembling or feeling shaky.  Being easily startled.  Feeling like your heart is pounding or racing.  Feeling out of breath or like you cannot take a deep breath.  Having trouble falling asleep or staying asleep.  Sweating.  Nausea, diarrhea, or irritable bowel syndrome (IBS).  Headaches.  Trouble concentrating or remembering facts.  Restlessness.  Irritability.  How is this diagnosed? Your health care provider can diagnose GAD based on your symptoms and medical history. You will also have a physical exam. The health care provider will ask specific questions about your symptoms, including how severe they are, when they started, and if they come and go. Your health care  provider may ask you about your use of alcohol or drugs, including prescription medicines. Your health care provider may refer you to a mental health specialist for further evaluation. Your health care provider will do a thorough examination and may perform additional tests to rule out other possible causes of your symptoms. To be diagnosed with GAD, a person must have anxiety that:  Is out of his or her control.  Affects several different aspects of his or her life, such as work and relationships.  Causes distress that makes him or her unable to take part in normal activities.  Includes at least three physical symptoms of GAD, such as restlessness, fatigue, trouble concentrating, irritability, muscle tension, or sleep problems.  Before your health care provider can confirm a diagnosis of GAD, these symptoms must be present more days than they are not, and they must last for six months or longer. How is this treated? The following therapies are usually used to treat GAD:  Medicine. Antidepressant medicine is usually prescribed for long-term daily control. Antianxiety medicines may be added in severe cases, especially when panic attacks occur.  Talk therapy (psychotherapy). Certain types of talk therapy can be helpful in treating GAD by providing support, education, and guidance. Options include: ? Cognitive behavioral therapy (CBT). People learn coping skills and techniques to ease their anxiety. They learn to identify unrealistic or negative thoughts and behaviors and to replace them with positive ones. ? Acceptance and commitment therapy (ACT). This treatment teaches people how to be mindful as a way to cope with unwanted thoughts and feelings. ? Biofeedback. This process trains you to   manage your body's response (physiological response) through breathing techniques and relaxation methods. You will work with a therapist while machines are used to monitor your physical symptoms.  Stress  management techniques. These include yoga, meditation, and exercise.  A mental health specialist can help determine which treatment is best for you. Some people see improvement with one type of therapy. However, other people require a combination of therapies. Follow these instructions at home:  Take over-the-counter and prescription medicines only as told by your health care provider.  Try to maintain a normal routine.  Try to anticipate stressful situations and allow extra time to manage them.  Practice any stress management or self-calming techniques as taught by your health care provider.  Do not punish yourself for setbacks or for not making progress.  Try to recognize your accomplishments, even if they are small.  Keep all follow-up visits as told by your health care provider. This is important. Contact a health care provider if:  Your symptoms do not get better.  Your symptoms get worse.  You have signs of depression, such as: ? A persistently sad, cranky, or irritable mood. ? Loss of enjoyment in activities that used to bring you joy. ? Change in weight or eating. ? Changes in sleeping habits. ? Avoiding friends or family members. ? Loss of energy for normal tasks. ? Feelings of guilt or worthlessness. Get help right away if:  You have serious thoughts about hurting yourself or others. If you ever feel like you may hurt yourself or others, or have thoughts about taking your own life, get help right away. You can go to your nearest emergency department or call:  Your local emergency services (911 in the [place]  A suicide crisis helpline, such as the National Suicide Prevention Lifeline at (917)510-2212. This is open 24 hours a day.  Summary  Generalized anxiety disorder (GAD) is a mental health disorder that involves worry that is not triggered by a specific event.  People with GAD often worry excessively about many things in their lives, such as their health and  family.  GAD may cause physical symptoms such as restlessness, trouble concentrating, sleep problems, frequent sweating, nausea, diarrhea, headaches, and trembling or muscle twitching.  A mental health specialist can help determine which treatment is best for you. Some people see improvement with one type of therapy. However, other people require a combination of therapies. This information is not intended to replace advice given to you by your health care provider. Make sure you discuss any questions you have with your health care provider. Document Released: 01/23/2013 Document Revised: 08/18/2016 Document Reviewed: 08/18/2016 Elsevier Interactive Patient Education  2018 [employer].  Osteoarthritis Osteoarthritis is a type of arthritis that affects tissue that covers the ends of bones in joints (cartilage). Cartilage acts as a cushion between the bones and helps them move smoothly. Osteoarthritis results when cartilage in the joints gets worn down. Osteoarthritis is sometimes called "wear and tear" arthritis. Osteoarthritis is the most common form of arthritis. It often occurs in older people. It is a condition that gets worse over time (a progressive condition). Joints that are most often affected by this condition are in:  Fingers.  Toes.  Hips.  Knees.  Spine, including neck and lower back.  What are the causes? This condition is caused by age-related wearing down of cartilage that covers the ends of bones. What increases the risk? The following factors may make you more likely to develop this condition:  Older  age.  Being overweight or obese.  Overuse of joints, such as in athletes.  Past injury of a joint.  Past surgery on a joint.  Family history of osteoarthritis.  What are the signs or symptoms? The main symptoms of this condition are pain, swelling, and stiffness in the joint. The joint may lose its shape over time. Small pieces of bone or cartilage may break  off and float inside of the joint, which may cause more pain and damage to the joint. Small deposits of bone (osteophytes) may grow on the edges of the joint. Other symptoms may include:  A grating or scraping feeling inside the joint when you move it.  Popping or creaking sounds when you move.  Symptoms may affect one or more joints. Osteoarthritis in a major joint, such as your knee or hip, can make it painful to walk or exercise. If you have osteoarthritis in your hands, you might not be able to grip items, twist your hand, or control small movements of your hands and fingers (fine motor skills). How is this diagnosed? This condition may be diagnosed based on:  Your medical history.  A physical exam.  Your symptoms.  X-rays of the affected joint(s).  Blood tests to rule out other types of arthritis.  How is this treated? There is no cure for this condition, but treatment can help to control pain and improve joint function. Treatment plans may include:  A prescribed exercise program that allows for rest and joint relief. You may work with a physical therapist.  A weight control plan.  Pain relief techniques, such as: ? Applying heat and cold to the joint. ? Electric pulses delivered to nerve endings under the skin (transcutaneous electrical nerve stimulation, or TENS). ? Massage. ? Certain nutritional supplements.  NSAIDs or prescription medicines to help relieve pain.  Medicine to help relieve pain and inflammation (corticosteroids). This can be given by mouth (orally) or as an injection.  Assistive devices, such as a brace, wrap, splint, specialized glove, or cane.  Surgery, such as: ? An osteotomy. This is done to reposition the bones and relieve pain or to remove loose pieces of bone and cartilage. ? Joint replacement surgery. You may need this surgery if you have very bad (advanced) osteoarthritis.  Follow these instructions at home: Activity  Rest your affected  joints as directed by your health care provider.  Do not drive or use heavy machinery while taking prescription pain medicine.  Exercise as directed. Your health care provider or physical therapist may recommend specific types of exercise, such as: ? Strengthening exercises. These are done to strengthen the muscles that support joints that are affected by arthritis. They can be performed with weights or with exercise bands to add resistance. ? Aerobic activities. These are exercises, such as brisk walking or water aerobics, that get your heart pumping. ? Range-of-motion activities. These keep your joints easy to move. ? Balance and agility exercises. Managing pain, stiffness, and swelling  If directed, apply heat to the affected area as often as told by your health care provider. Use the heat source that your health care provider recommends, such as a moist heat pack or a heating pad. ? If you have a removable assistive device, remove it as told by your health care provider. ? Place a towel between your skin and the heat source. If your health care provider tells you to keep the assistive device on while you apply heat, place a towel between the assistive  device and the heat source. ? Leave the heat on for 20-30 minutes. ? Remove the heat if your skin turns bright red. This is especially important if you are unable to feel pain, heat, or cold. You may have a greater risk of getting burned.  If directed, put ice on the affected joint: ? If you have a removable assistive device, remove it as told by your health care provider. ? Put ice in a plastic bag. ? Place a towel between your skin and the bag. If your health care provider tells you to keep the assistive device on during icing, place a towel between the assistive device and the bag. ? Leave the ice on for 20 minutes, 2-3 times a day. General instructions  Take over-the-counter and prescription medicines only as told by your health care  provider.  Maintain a healthy weight. Follow instructions from your health care provider for weight control. These may include dietary restrictions.  Do not use any products that contain nicotine or tobacco, such as cigarettes and e-cigarettes. These can delay bone healing. If you need help quitting, ask your health care provider.  Use assistive devices as directed by your health care provider.  Keep all follow-up visits as told by your health care provider. This is important. Where to find more information:  [employer] of Arthritis and Musculoskeletal and Skin Diseases: www.niams.http://www.myers.net/  [employer] on Aging: https://walker.com/  American College of Rheumatology: www.rheumatology.org Contact a health care provider if:  Your skin turns red.  You develop a rash.  You have pain that gets worse.  You have a fever along with joint or muscle aches. Get help right away if:  You lose a lot of weight.  You suddenly lose your appetite.  You have night sweats. Summary  Osteoarthritis is a type of arthritis that affects tissue covering the ends of bones in joints (cartilage).  This condition is caused by age-related wearing down of cartilage that covers the ends of bones.  The main symptom of this condition is pain, swelling, and stiffness in the joint.  There is no cure for this condition, but treatment can help to control pain and improve joint function. This information is not intended to replace advice given to you by your health care provider. Make sure you discuss any questions you have with your health care provider. Document Released: 09/28/2005 Document Revised: 06/01/2016 Document Reviewed: 06/01/2016 Elsevier Interactive Patient Education  2018 [employer].  Attention Deficit Hyperactivity Disorder, Pediatric Attention deficit hyperactivity disorder (ADHD) is a condition that can make it hard for a child to pay attention and concentrate or to control his or  her behavior. The child may also have a lot of energy. ADHD is a disorder of the brain (neurodevelopmental disorder), and symptoms are typically first seen in early childhood. It is a common reason for behavioral and academic problems in school. There are three main types of ADHD:  Inattentive. With this type, children have difficulty paying attention.  Hyperactive-impulsive. With this type, children have a lot of energy and have difficulty controlling their behavior.  Combination. This type involves having symptoms of both of the other types.  ADHD is a lifelong condition. If it is not treated, the disorder can affect a child's future academic achievement, employment, and relationships. What are the causes? The exact cause of this condition is not known. What increases the risk? This condition is more likely to develop in:  Children who have a first-degree relative, such as a  parent or brother or sister, with the condition.  Children who had a low birth weight.  Children whose mothers had problems during pregnancy or used alcohol or tobacco during pregnancy.  Children who have had a brain infection or a head injury.  Children who have been exposed to lead.  What are the signs or symptoms? Symptoms of this condition depend on the type of ADHD. Symptoms are listed here for each type: Inattentive  Problems with organization.  Difficulty staying focused.  Problems completing assignments at school.  Often making simple mistakes.  Problems sustaining mental effort.  Not listening to instructions.  Losing things often.  Forgetting things often.  Being easily distracted. Hyperactive-impulsive  Fidgeting often.  Difficulty sitting still in one's seat.  Talking a lot.  Talking out of turn.  Interrupting others.  Difficulty relaxing or doing quiet activities.  High energy levels and constant movement.  Difficulty waiting.  Always "on the  go." Combination  Having symptoms of both of the other types. Children with ADHD may feel frustrated with themselves and may find school to be particularly discouraging. They often perform below their abilities in school. As children get older, the excess movement can lessen, but the problems with paying attention and staying organized often continue. Most children do not outgrow ADHD, but with good treatment, they can learn to cope with the symptoms. How is this diagnosed? This condition is diagnosed based on a child's symptoms and academic history. The child's health care provider will do a complete assessment. As part of the assessment, the health care provider will ask the child questions and will ask the parents and teachers for their observations of the child. The health care provider looks for specific symptoms of ADHD. Diagnosis will include:  Ruling out other reasons for the child's behavior.  Reviewing behavior rating scales that have been filled out about the child by people who deal with the child on a daily basis.  A diagnosis is made only after all information from multiple people has been considered. How is this treated? Treatment for this condition may include:  Behavior therapy.  Medicines to decrease impulsivity and hyperactivity and to increase attention. Behavior therapy is preferred for children younger than 60 years old. The combination of medicine and behavior therapy is most effective for children older than 30 years of age.  Tutoring or extra support at school.  Techniques for parents to use at home to help manage their child's symptoms and behavior.  Follow these instructions at home: Eating and drinking  Offer your child a well-balanced diet. Breakfast that includes a balance of whole grains, protein, and fruits or vegetables is especially important for school performance.  If your child has trouble with hyperactivity, have your child avoid drinks that contain  caffeine. These include: ? Soft drinks. ? Coffee. ? Tea.  If your child is older and finds that caffeinated drinks help to improve his or her attention, talk with your child's health care provider about what amount of caffeine intake is a safe for your child. Lifestyle   Make sure your child gets a full night of sleep and regular daily exercise.  Help manage your child's behavior by following the techniques learned in therapy. These may include: ? Looking for good behavior and rewarding it. ? Making rules for behavior that your child can understand and follow. ? Giving clear instructions. ? Responding consistently to your child's challenging behaviors. ? Setting realistic goals. ? Looking for activities that can lead to  success and self-esteem. ? Making time for pleasant activities with your child. ? Giving lots of affection.  Help your child learn to be organized. Some ways to do this include: ? Keeping daily schedules the same. Have a regular wake-up time and bedtime for your child. Schedule all activities, including time for homework and time for play. Post the schedule in a place where your child will see it. Mark schedule changes in advance. ? Having a regular place for your child to store items such as clothing, backpacks, and school supplies. ? Encouraging your child to write down school assignments and to bring home needed books. Work with your child's teachers for assistance in organizing school work. General instructions  Learn as much as you can about ADHD. This will improve your ability to help your child and to make sure he or she gets the support needed. It will also help you educate your child's teachers and instructors if they do not feel that they have adequate knowledge or experience in these areas.  Work with your child's teachers to make sure your child gets the support and extra help that is needed. This may include: ? Tutoring. ? Teacher cues to help your child  remain on task. ? Seating changes so your child is working at a desk that is free from distractions.  Give over-the-counter and prescription medicines only as told by your child's health care provider.  Keep all follow-up visits as told by your health care provider. This is important. Contact a health care provider if:  Your child has repeated muscle twitches (tics), coughs, or speech outbursts.  Your child has sleep problems.  Your child has a marked loss of appetite.  Your child develops depression.  Your child has new or worsening behavioral problems.  Your child has dizziness.  Your child has a racing heart.  Your child has stomach pains.  Your child develops headaches. Get help right away if:  Your child talks about or threatens suicide.  You are worried that your child is having a bad reaction to a medicine that he or she is taking for ADHD. This information is not intended to replace advice given to you by your health care provider. Make sure you discuss any questions you have with your health care provider. Document Released: 09/18/2002 Document Revised: 05/27/2016 Document Reviewed: 04/23/2016 Elsevier Interactive Patient Education  [employer].

## 2017-12-16 DIAGNOSIS — H40013 Open angle with borderline findings, low risk, bilateral: Secondary | ICD-10-CM | POA: Diagnosis not present

## 2017-12-29 ENCOUNTER — Other Ambulatory Visit: Payer: Self-pay

## 2017-12-29 ENCOUNTER — Ambulatory Visit: Payer: Medicaid Other | Admitting: Obstetrics & Gynecology

## 2017-12-29 ENCOUNTER — Encounter: Payer: Self-pay | Admitting: Obstetrics & Gynecology

## 2017-12-29 VITALS — BP 127/82 | HR 79 | Ht 70.0 in | Wt 266.0 lb

## 2017-12-29 DIAGNOSIS — Z1231 Encounter for screening mammogram for malignant neoplasm of breast: Secondary | ICD-10-CM | POA: Diagnosis not present

## 2017-12-29 DIAGNOSIS — Z1239 Encounter for other screening for malignant neoplasm of breast: Secondary | ICD-10-CM

## 2017-12-29 NOTE — Progress Notes (Signed)
New GYN, [id number], No LMP recorded (lmp unknown). Patient is not currently having periods (Reason: IUD). here to established care. Last pap was 11/19/17, never had a mammogram, wants to get one scheduled.  She wants to discuss IUD options, had Mirena was inserted 4.5 yrs ago. Breast exam was done last month and was normal. She may have screening for breast cancer with mammogram and return for new IUD 05/2018. She is considering Paragard although she may benefit from continuing with Mirena.  10 min face to face and coordination of care  Adam PhenixArnold, James G, MD 12/29/2017

## 2017-12-29 NOTE — Patient Instructions (Signed)
Mammogram A mammogram is an X-ray of the breasts that is done to check for abnormal changes. This procedure can screen for and detect any changes that may suggest breast cancer. A mammogram can also identify other changes and variations in the breast, such as:  Inflammation of the breast tissue (mastitis).  An infected area that contains a collection of pus (abscess).  A fluid-filled sac (cyst).  Fibrocystic changes. This is when breast tissue becomes denser, which can make the tissue feel rope-like or uneven under the skin.  Tumors that are not cancerous (benign).  Tell a health care provider about:  Any allergies you have.  If you have breast implants.  If you have had previous breast disease, biopsy, or surgery.  If you are breastfeeding.  Any possibility that you could be pregnant, if this applies.  If you are younger than age 25.  If you have a family history of breast cancer. What are the risks? Generally, this is a safe procedure. However, problems may occur, including:  Exposure to radiation. Radiation levels are very low with this test.  The results being misinterpreted.  The need for further tests.  The inability of the mammogram to detect certain cancers.  What happens before the procedure?  Schedule your test about 1-2 weeks after your menstrual period. This is usually when your breasts are the least tender.  If you have had a mammogram done at a different facility in the past, get the mammogram X-rays or have them sent to your current exam facility in order to compare them.  Wash your breasts and under your arms the day of the test.  Do not wear deodorants, perfumes, lotions, or powders anywhere on your body on the day of the test.  Remove any jewelry from your neck.  Wear clothes that you can change into and out of easily. What happens during the procedure?  You will undress from the waist up and put on a gown.  You will stand in front of the  X-ray machine.  Each breast will be placed between two plastic or glass plates. The plates will compress your breast for a few seconds. Try to stay as relaxed as possible during the procedure. This does not cause any harm to your breasts and any discomfort you feel will be very brief.  X-rays will be taken from different angles of each breast. The procedure may vary among health care providers and hospitals. What happens after the procedure?  The mammogram will be examined by a specialist (radiologist).  You may need to repeat certain parts of the test, depending on the quality of the images. This is commonly done if the radiologist needs a better view of the breast tissue.  Ask when your test results will be ready. Make sure you get your test results.  You may resume your normal activities. This information is not intended to replace advice given to you by your health care provider. Make sure you discuss any questions you have with your health care provider. Document Released: 09/25/2000 Document Revised: 03/02/2016 Document Reviewed: 12/07/2014 Elsevier Interactive Patient Education  2018 [employer]  

## 2018-01-10 ENCOUNTER — Telehealth: Payer: Self-pay

## 2018-01-10 ENCOUNTER — Telehealth: Payer: Self-pay | Admitting: Nurse Practitioner

## 2018-01-10 NOTE — Telephone Encounter (Signed)
Patient came in to ask PCP if it's okay for her to take her OTC sleeping pill 8-10 pills to help her sleep.  CMA informed patient to call back with the name of the bottle and the strength.  Patient understood and will call back.

## 2018-01-10 NOTE — Telephone Encounter (Signed)
Will informed PCP.

## 2018-01-10 NOTE — Telephone Encounter (Signed)
Patient called back and stated the medication name is Sleep Aide manufactured by [employer].

## 2018-01-10 NOTE — Telephone Encounter (Signed)
Patient called and wanted to relay to pcp nurse that the name of the medication was sleep aide 25 mg tablets manufactured by [employer].

## 2018-01-11 NOTE — Telephone Encounter (Signed)
CMA spoke to patient to inform her that PCP do not recommend for her to take much dose for sleeping.  PCP did suggest Melatonin, patient stated that it does not help her.   Patient will make an OV appt. For sleeping aide.

## 2018-01-12 ENCOUNTER — Encounter: Payer: Self-pay | Admitting: Nurse Practitioner

## 2018-01-12 ENCOUNTER — Other Ambulatory Visit: Payer: Self-pay | Admitting: Nurse Practitioner

## 2018-01-12 ENCOUNTER — Telehealth: Payer: Self-pay | Admitting: Nurse Practitioner

## 2018-01-12 ENCOUNTER — Ambulatory Visit: Payer: Medicaid Other | Attending: Nurse Practitioner | Admitting: Nurse Practitioner

## 2018-01-12 VITALS — BP 129/91 | HR 83 | Temp 98.3°F | Ht 70.0 in | Wt 266.6 lb

## 2018-01-12 DIAGNOSIS — Z79899 Other long term (current) drug therapy: Secondary | ICD-10-CM | POA: Diagnosis not present

## 2018-01-12 DIAGNOSIS — Z87820 Personal history of traumatic brain injury: Secondary | ICD-10-CM | POA: Insufficient documentation

## 2018-01-12 DIAGNOSIS — F419 Anxiety disorder, unspecified: Secondary | ICD-10-CM | POA: Diagnosis not present

## 2018-01-12 DIAGNOSIS — G4709 Other insomnia: Secondary | ICD-10-CM | POA: Diagnosis not present

## 2018-01-12 DIAGNOSIS — F319 Bipolar disorder, unspecified: Secondary | ICD-10-CM | POA: Insufficient documentation

## 2018-01-12 DIAGNOSIS — Z791 Long term (current) use of non-steroidal anti-inflammatories (NSAID): Secondary | ICD-10-CM | POA: Diagnosis not present

## 2018-01-12 DIAGNOSIS — I10 Essential (primary) hypertension: Secondary | ICD-10-CM | POA: Insufficient documentation

## 2018-01-12 MED ORDER — HYDROXYZINE HCL 10 MG PO TABS: 10.0000 mg | ORAL_TABLET | Freq: Three times a day (TID) | ORAL | 0 refills | Status: DC | PRN

## 2018-01-12 MED ORDER — DOXEPIN HCL 6 MG PO TABS: 6.0000 mg | ORAL_TABLET | Freq: Every day | ORAL | 1 refills | Status: DC

## 2018-01-12 MED ORDER — HYDROXYZINE HCL 50 MG PO TABS: 50.0000 mg | ORAL_TABLET | Freq: Every day | ORAL | 0 refills | Status: DC

## 2018-01-12 NOTE — Telephone Encounter (Signed)
[facility] pharmacy call about the prescription of Doxepin HCl 6 MG TABS  They said that is not coming on 6mg  an also said that is discontinue that medication, please call them back, [facility] [address] [place], [place] - [id number]) [phone]

## 2018-01-12 NOTE — Telephone Encounter (Signed)
S/W Pharmacy. Medication has been changed. They will notify patient.

## 2018-01-12 NOTE — Patient Instructions (Signed)

## 2018-01-12 NOTE — Progress Notes (Signed)
Assessment & Plan:  Sarah Salas was seen today for sleeping problem.  Diagnoses and all orders for this visit:  Other insomnia -     Doxepin HCl 6 MG TABS; Take 1 tablet (6 mg total) by mouth at bedtime.  She has an appointment with psychiatry on 01-21-2018 to be evaluated for ADD/ADHD. She has been on adderral in the past and is hoping to restart.   Patient has been counseled on age-appropriate routine health concerns for screening and prevention. These are reviewed and up-to-date. Referrals have been placed accordingly. Immunizations are up-to-date or declined.    Subjective:   Chief Complaint  Patient presents with  . Sleeping Problem    Pt. stated she would like to talk to PCP about getting some Rx for sleep. She takes OTC medicaion for sleep and take about 8-10 tablets, recommend dose are 2 tablets.    Insomnia  Primary symptoms: difficulty falling asleep, frequent awakening, malaise/fatigue.  The current episode started more than one year. The onset quality is gradual. The problem occurs nightly. The problem is unchanged. The symptoms are aggravated by SSRI use, caffeine and anxiety. How many drinks containing alcohol do you have on a typical day when you are drinking: 1/2 cup coffee in the mornings.  Types of beverages you drink: soda (Decaffeinated, diet ). The treatment provided no relief. Typical bedtime:  8-10 P.M..  How long after going to bed to you fall asleep: 15-30 minutes.   PMH includes: depression. Prior diagnostic workup includes:  No prior workup.      Review of Systems  Constitutional: Positive for malaise/fatigue. Negative for fever and weight loss.  Eyes: Negative.  Negative for blurred vision, double vision and photophobia.  Respiratory: Negative.  Negative for cough and shortness of breath.   Cardiovascular: Negative.  Negative for chest pain, palpitations and leg swelling.  Gastrointestinal: Negative.  Negative for heartburn, nausea and vomiting.    Musculoskeletal: Negative.  Negative for myalgias.  Neurological: Negative.  Negative for dizziness, focal weakness, seizures and headaches.  Psychiatric/Behavioral: Positive for depression. Negative for suicidal ideas. The patient is nervous/anxious and has insomnia.     Past Medical History:  Diagnosis Date  . Anxiety   . Bipolar 1 disorder (HCC)   . Brain injury (HCC)   . Hypertension     Past Surgical History:  Procedure Laterality Date  . BRAIN SURGERY    . FRACTURE SURGERY      Family History  Problem Relation Age of Onset  . Bipolar disorder Mother   . Hypertension Mother   . Miscarriages / [place] Mother   . Vision loss Mother   . Varicose Veins Mother   . Alcohol abuse Father   . Heart disease Father   . Learning disabilities Father   . Bipolar disorder Sister   . Learning disabilities Sister   . Diabetes Paternal Aunt   . Bipolar disorder Maternal Grandmother   . Hypertension Maternal Grandfather   . Cancer Paternal Grandmother   . Cancer Paternal Grandfather     Social History Reviewed with no changes to be made today.   Outpatient Medications Prior to Visit  Medication Sig Dispense Refill  . citalopram (CELEXA) 40 MG tablet Take 1 tablet (40 mg total) by mouth daily. 90 tablet 2  . meloxicam (MOBIC) 15 MG tablet Take 1 tablet (15 mg total) by mouth daily. 30 tablet 2  . acetaminophen (TYLENOL) 500 MG tablet Take 500 mg by mouth 4 (four) times daily.    Mar[facility]  ipratropium (ATROVENT) 0.06 % nasal spray Place 2 sprays into both nostrils 4 (four) times daily. (Patient not taking: Reported on 11/19/2017) 15 mL 1   No facility-administered medications prior to visit.     No Known Allergies     Objective:    BP (!) 129/91 (BP Location: Left Arm, Patient Position: Sitting, Cuff Size: Large)   Pulse 83   Temp 98.3 F (36.8 C) (Oral)   Ht 5\' 10"  (1.778 m)   Wt 266 lb 9.6 oz (120.9 kg)   LMP  (LMP Unknown)   SpO2 98%   BMI 38.25 kg/m  Wt Readings from  Last 3 Encounters:  01/12/18 266 lb 9.6 oz (120.9 kg)  12/29/17 266 lb (120.7 kg)  12/15/17 263 lb 12.8 oz (119.7 kg)    Physical Exam  Constitutional: She is oriented to person, place, and time. She appears well-developed and well-nourished. She is cooperative.  HENT:  Head: Normocephalic and atraumatic.  Eyes: EOM are normal.  Neck: Normal range of motion.  Cardiovascular: Normal rate, regular rhythm and normal heart sounds. Exam reveals no gallop and no friction rub.  No murmur heard. Pulmonary/Chest: Effort normal and breath sounds normal. No tachypnea. No respiratory distress. She has no decreased breath sounds. She has no wheezes. She has no rhonchi. She has no rales. She exhibits no tenderness.  Abdominal: Soft. Bowel sounds are normal.  Musculoskeletal: Normal range of motion. She exhibits no edema.  Neurological: She is alert and oriented to person, place, and time. Coordination normal.  Skin: Skin is warm and dry.  Psychiatric: She has a normal mood and affect. Her behavior is normal. Judgment and thought content normal.  Nursing note and vitals reviewed.      Patient has been counseled extensively about nutrition and exercise as well as the importance of adherence with medications and regular follow-up. The patient was given clear instructions to go to ER or return to medical center if symptoms don't improve, worsen or new problems develop. The patient verbalized understanding.   Follow-up: Return in about 1 month (around 02/09/2018) for Insomnia.   Claiborne RiggZelda W Salem Mastrogiovanni, FNP-BC [facility] [place], [place] [phone]   01/12/2018, 9:11 AM

## 2018-01-21 ENCOUNTER — Ambulatory Visit (INDEPENDENT_AMBULATORY_CARE_PROVIDER_SITE_OTHER): Payer: Medicaid Other | Admitting: Psychiatry

## 2018-01-21 ENCOUNTER — Encounter (HOSPITAL_COMMUNITY): Payer: Self-pay | Admitting: Psychiatry

## 2018-01-21 VITALS — BP 140/78 | HR 78 | Ht 70.0 in | Wt 270.0 lb

## 2018-01-21 DIAGNOSIS — F319 Bipolar disorder, unspecified: Secondary | ICD-10-CM

## 2018-01-21 DIAGNOSIS — Z811 Family history of alcohol abuse and dependence: Secondary | ICD-10-CM

## 2018-01-21 DIAGNOSIS — Z87891 Personal history of nicotine dependence: Secondary | ICD-10-CM | POA: Diagnosis not present

## 2018-01-21 DIAGNOSIS — M2550 Pain in unspecified joint: Secondary | ICD-10-CM | POA: Diagnosis not present

## 2018-01-21 DIAGNOSIS — Z975 Presence of (intrauterine) contraceptive device: Secondary | ICD-10-CM | POA: Diagnosis not present

## 2018-01-21 DIAGNOSIS — Z818 Family history of other mental and behavioral disorders: Secondary | ICD-10-CM | POA: Diagnosis not present

## 2018-01-21 MED ORDER — TRAZODONE HCL 50 MG PO TABS: 50.0000 mg | ORAL_TABLET | Freq: Every day | ORAL | 0 refills | Status: DC

## 2018-01-21 MED ORDER — LURASIDONE HCL 40 MG PO TABS: 40.0000 mg | ORAL_TABLET | Freq: Every day | ORAL | 0 refills | Status: DC
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
## 2018-01-24 ENCOUNTER — Ambulatory Visit
Admission: RE | Admit: 2018-01-24 | Discharge: 2018-01-24 | Disposition: A | Discharge status: Home / Self Care | Payer: Medicaid Other | Source: Ambulatory Visit | Attending: Obstetrics & Gynecology | Admitting: Obstetrics & Gynecology

## 2018-01-24 DIAGNOSIS — Z1231 Encounter for screening mammogram for malignant neoplasm of breast: Secondary | ICD-10-CM | POA: Diagnosis not present

## 2018-01-24 DIAGNOSIS — Z1239 Encounter for other screening for malignant neoplasm of breast: Secondary | ICD-10-CM

## 2018-02-11 ENCOUNTER — Other Ambulatory Visit: Payer: Self-pay

## 2018-02-11 ENCOUNTER — Ambulatory Visit (HOSPITAL_COMMUNITY): Payer: Medicaid Other | Admitting: Psychiatry

## 2018-02-11 ENCOUNTER — Ambulatory Visit (INDEPENDENT_AMBULATORY_CARE_PROVIDER_SITE_OTHER): Payer: Medicaid Other | Admitting: Psychiatry

## 2018-02-11 ENCOUNTER — Encounter (HOSPITAL_COMMUNITY): Payer: Self-pay | Admitting: Psychiatry

## 2018-02-11 DIAGNOSIS — F3181 Bipolar II disorder: Secondary | ICD-10-CM | POA: Diagnosis not present

## 2018-02-11 DIAGNOSIS — F419 Anxiety disorder, unspecified: Secondary | ICD-10-CM | POA: Diagnosis not present

## 2018-02-11 DIAGNOSIS — F909 Attention-deficit hyperactivity disorder, unspecified type: Secondary | ICD-10-CM

## 2018-02-11 DIAGNOSIS — Z87891 Personal history of nicotine dependence: Secondary | ICD-10-CM

## 2018-02-11 DIAGNOSIS — Z79899 Other long term (current) drug therapy: Secondary | ICD-10-CM

## 2018-02-11 DIAGNOSIS — Z560 Unemployment, unspecified: Secondary | ICD-10-CM | POA: Diagnosis not present

## 2018-02-11 DIAGNOSIS — F319 Bipolar disorder, unspecified: Secondary | ICD-10-CM

## 2018-02-11 DIAGNOSIS — Z811 Family history of alcohol abuse and dependence: Secondary | ICD-10-CM

## 2018-02-11 DIAGNOSIS — Z818 Family history of other mental and behavioral disorders: Secondary | ICD-10-CM | POA: Diagnosis not present

## 2018-02-11 MED ORDER — TRAZODONE HCL 50 MG PO TABS: 50.0000 mg | ORAL_TABLET | Freq: Every day | ORAL | 0 refills | Status: DC

## 2018-02-11 MED ORDER — LURASIDONE HCL 60 MG PO TABS: 60.0000 mg | ORAL_TABLET | Freq: Every day | ORAL | 0 refills | Status: DC

## 2018-02-11 NOTE — Progress Notes (Signed)
BH MD/PA/NP OP Progress Note  02/11/2018 10:17 AM Sarah Salas  MRN:  [id number]  Chief Complaint: I like new medication.  It is helping my mood.  HPI: Sarah Salas is 42 year old Caucasian single unemployed female who was seen first time 4 weeks ago.  She was referred from primary care physician for her psychiatric illness.  Patient has ADHD and hypomania.  Lately she started to have struggle controlling her mood.  She is having irritability, racing thoughts, poor attention, poor concentration, hyperactivity and poor sleep.  She does not want ADHD medication because she feels that makes her more anxiety and nervousness.  However she like to try the medication that can help her mood and irritability.  We started her on Latuda and she noticed improvement in her mood and agitation.  She also taking trazodone which is helping her sleep.  She lives with her mother and her stepfather.  Patient told her mother noticed the Latuda helping the mood but wondering if dose can further increase.  We have recommended to see Dr. Leonides Cave for testing and therapy but patient does not like to go back on ADD medication.  She also does not want therapy.  Her sleep is improved with trazodone.  Patient denies any paranoia, hallucination, suicidal thoughts or homicidal thought.  She denies drinking or using any illegal substances.  Patient never married and she has no children.  She is on disability.  Visit Diagnosis:    ICD-10-CM   1. Bipolar I disorder (HCC) F31.9 traZODone (DESYREL) 50 MG tablet    Lurasidone HCl 60 MG TABS  2. Anxiety F41.9     Past Psychiatric History: Reviewed Patient diagnosed with ADHD but never had psychological testing.  She took Adderall when she in college.  She also tried doxepin and Vistaril with poor outcome.  Patient denies any history of paranoia, aggressive behavior psychiatric inpatient treatment , or any suicidal attempt.   Past Medical History:  Past Medical History:  Diagnosis Date  .  Anxiety   . Bipolar 1 disorder (HCC)   . Brain injury (HCC)   . Hypertension     Past Surgical History:  Procedure Laterality Date  . BRAIN SURGERY    . FRACTURE SURGERY      Family Psychiatric History: Reviewed.  Family History:  Family History  Problem Relation Age of Onset  . Bipolar disorder Mother   . Hypertension Mother   . Miscarriages / [place] Mother   . Vision loss Mother   . Varicose Veins Mother   . Alcohol abuse Father   . Heart disease Father   . Learning disabilities Father   . Bipolar disorder Sister   . Learning disabilities Sister   . Diabetes Paternal Aunt   . Bipolar disorder Maternal Grandmother   . Hypertension Maternal Grandfather   . Cancer Paternal Grandmother   . Cancer Paternal Grandfather     Social History:  Social History   Socioeconomic History  . Marital status: Single    Spouse name: Not on file  . Number of children: 0  . Years of education: Not on file  . Highest education level: Bachelor's degree (e.g., BA, AB, BS)  Occupational History  . Not on file  Social Needs  . Financial resource strain: Somewhat hard  . Food insecurity:    Worry: Never true    Inability: Never true  . Transportation needs:    Medical: No    Non-medical: No  Tobacco Use  . Smoking status: Former Games developer  .  Smokeless tobacco: Never Used  Substance and Sexual Activity  . Alcohol use: No  . Drug use: No  . Sexual activity: Not Currently    Birth control/protection: IUD  Lifestyle  . Physical activity:    Days per week: 0 days    Minutes per session: 0 min  . Stress: To some extent  Relationships  . Social connections:    Talks on phone: More than three times a week    Gets together: More than three times a week    Attends religious service: Never    Active member of club or organization: No    Attends meetings of clubs or organizations: Never    Relationship status: Never married  Other Topics Concern  . Not on file  Social History  Narrative  . Not on file    Allergies: No Known Allergies  Metabolic Disorder Labs: No results found for: HGBA1C, MPG No results found for: PROLACTIN Lab Results  Component Value Date   CHOL 209 (H) 11/19/2017   TRIG 171 (H) 11/19/2017   HDL 41 11/19/2017   CHOLHDL 5.1 (H) 11/19/2017   LDLCALC 134 (H) 11/19/2017   Lab Results  Component Value Date   TSH 1.520 11/19/2017    Therapeutic Level Labs: No results found for: LITHIUM No results found for: VALPROATE No components found for:  CBMZ  Current Medications: Current Outpatient Medications  Medication Sig Dispense Refill  . citalopram (CELEXA) 40 MG tablet Take 1 tablet (40 mg total) by mouth daily. 90 tablet 2  . ipratropium (ATROVENT) 0.06 % nasal spray Place 2 sprays into both nostrils 4 (four) times daily. (Patient not taking: Reported on 11/19/2017) 15 mL 1  . lurasidone (LATUDA) 40 MG TABS tablet Take 1 tablet (40 mg total) by mouth daily with breakfast. 30 tablet 0  . meloxicam (MOBIC) 15 MG tablet Take 1 tablet (15 mg total) by mouth daily. 30 tablet 2  . traZODone (DESYREL) 50 MG tablet Take 1 tablet (50 mg total) by mouth at bedtime. 30 tablet 0   No current facility-administered medications for this visit.      Musculoskeletal: Strength & Muscle Tone: within normal limits Gait & Station: normal Patient leans: N/A  Psychiatric Specialty Exam: ROS  Blood pressure 92/64, pulse 66, temperature (!) 97.4 F (36.3 C), temperature source Oral, weight 271 lb 9.6 oz (123.2 kg).There is no height or weight on file to calculate BMI.  General Appearance: Casual and Obese  Eye Contact:  Fair  Speech:  Pressured and Fast rambling  Volume:  Increased  Mood:  Anxious  Affect:  Labile  Thought Process:  Descriptions of Associations: Intact  Orientation:  Full (Time, Place, and Person)  Thought Content: Rumination   Suicidal Thoughts:  No  Homicidal Thoughts:  No  Memory:  Immediate;   Fair Recent;   Fair Remote;    Fair  Judgement:  Good  Insight:  Good  Psychomotor Activity:  Increased  Concentration:  Concentration: Fair and Attention Span: Fair  Recall:  [employer] of Knowledge: Fair  Language: Good  Akathisia:  No  Handed:  Right  AIMS (if indicated): not done  Assets:  Communication Skills Desire for Improvement Resilience Social Support  ADL's:  Intact  Cognition: Impaired,  Mild  Sleep:  Improved   Screenings: GAD-7     Office Visit from 01/12/2018 in [facility] Office Visit from 12/15/2017 in [facility] Office Visit from 11/19/2017  in [facility] Office Visit from 11/05/2017 in [facility]  Total GAD-7 Score  PHQ2-9     Office Visit from 01/12/2018 in [facility] Office Visit from 12/15/2017 in [facility] Office Visit from 11/19/2017 in [facility] Office Visit from 11/05/2017 in [facility]  PHQ-2 Total Score  0  0  0  0  PHQ-9 Total Score  5  0  0  0       Assessment and Plan: Bipolar disorder type II.  ADHD.  Patient doing better with Latuda.  However she feels dose is not strong.  I recommended to try 60 mg daily.  Continue trazodone 50 mg at bedtime.  Her sleep is improved.  She is getting Celexa 40 mg from her primary care physician.  Patient does not want to do psychological testing as she does not have interest in going back on ADHD medication which make her anxious.  She also not interested in counseling.  Patient has no tremors, shakes or any EPS.  Discussed medication side effects and benefits.  Recommended to call us back if she has any question or any concern.  Follow-up in 3 months.   Cleotis Nipper, MD 02/11/2018, 10:17 AM

## 2018-02-15 ENCOUNTER — Telehealth (HOSPITAL_COMMUNITY): Payer: Self-pay

## 2018-02-15 ENCOUNTER — Other Ambulatory Visit (HOSPITAL_COMMUNITY): Payer: Self-pay | Admitting: Psychiatry

## 2018-02-15 DIAGNOSIS — F319 Bipolar disorder, unspecified: Secondary | ICD-10-CM

## 2018-02-15 NOTE — Telephone Encounter (Signed)
Patient called about increasing medication. Patient reports that she is still feeling very fidgety.

## 2018-02-15 NOTE — Telephone Encounter (Signed)
She can try Latuda 80 mg daily.  Please call in a new prescription.

## 2018-02-16 MED ORDER — LURASIDONE HCL 80 MG PO TABS: 80.0000 mg | ORAL_TABLET | Freq: Every day | ORAL | 0 refills | Status: DC

## 2018-02-16 NOTE — Telephone Encounter (Signed)
Sent new prescription to the pharmacy and called the patient to let her know 

## 2018-02-21 ENCOUNTER — Other Ambulatory Visit (HOSPITAL_COMMUNITY): Payer: Self-pay | Admitting: Psychiatry

## 2018-02-21 ENCOUNTER — Telehealth (HOSPITAL_COMMUNITY): Payer: Self-pay

## 2018-02-21 MED ORDER — LAMOTRIGINE 25 MG PO TABS: ORAL_TABLET | ORAL | 1 refills | Status: DC

## 2018-02-21 NOTE — Telephone Encounter (Signed)
Patient is calling to report mania, she said that she would like to come off of the Jordan. So far this morning she has repotted the same plant 5 times, vacuumed and is feeling very anxious. Patients speech was very rapid. Please review and advise, thank you

## 2018-02-21 NOTE — Telephone Encounter (Signed)
I returned patient's phone call.  Recommended to discontinue Latuda.  We will try Lamictal 25 mg daily for 1 week and then 50 mg daily.  I had called the Lamictal to her pharmacy.  Recommended if she had any rash then she need to stop the Lamictal immediately.  I also recommended to call us back if symptoms do not improve.

## 2018-03-13 ENCOUNTER — Encounter

## 2018-03-14 ENCOUNTER — Ambulatory Visit: Payer: Self-pay | Admitting: Nurse Practitioner

## 2018-03-14 ENCOUNTER — Ambulatory Visit: Payer: Medicaid Other | Attending: Nurse Practitioner | Admitting: Nurse Practitioner

## 2018-03-14 ENCOUNTER — Encounter: Payer: Self-pay | Admitting: Nurse Practitioner

## 2018-03-14 VITALS — BP 107/75 | HR 72 | Temp 98.3°F | Ht 70.0 in | Wt 269.2 lb

## 2018-03-14 DIAGNOSIS — G4700 Insomnia, unspecified: Secondary | ICD-10-CM | POA: Diagnosis not present

## 2018-03-14 DIAGNOSIS — F319 Bipolar disorder, unspecified: Secondary | ICD-10-CM | POA: Diagnosis not present

## 2018-03-14 DIAGNOSIS — I10 Essential (primary) hypertension: Secondary | ICD-10-CM | POA: Diagnosis not present

## 2018-03-14 DIAGNOSIS — Z833 Family history of diabetes mellitus: Secondary | ICD-10-CM | POA: Diagnosis not present

## 2018-03-14 DIAGNOSIS — G4709 Other insomnia: Secondary | ICD-10-CM

## 2018-03-14 DIAGNOSIS — Z79899 Other long term (current) drug therapy: Secondary | ICD-10-CM | POA: Diagnosis not present

## 2018-03-14 DIAGNOSIS — Z8249 Family history of ischemic heart disease and other diseases of the circulatory system: Secondary | ICD-10-CM | POA: Insufficient documentation

## 2018-03-14 MED ORDER — TRAZODONE HCL 50 MG PO TABS: 50.0000 mg | ORAL_TABLET | Freq: Every day | ORAL | 0 refills | Status: DC

## 2018-03-14 NOTE — Progress Notes (Signed)
Assessment & Plan:  Sarah Salas was seen today for follow-up.  Diagnoses and all orders for this visit:  Bipolar I disorder (HCC) Continue Lamictal as prescribed.  Do not skip doses. Continue follow-up with psychiatry as instructed.  Other insomnia -     traZODone (DESYREL) 50 MG tablet; Take 1 tablet (50 mg total) by mouth at bedtime.    Patient has been counseled on age-appropriate routine health concerns for screening and prevention. These are reviewed and up-to-date. Referrals have been placed accordingly. Immunizations are up-to-date or declined.    Subjective:   Chief Complaint  Patient presents with  . Follow-up   HPI Sarah Salas 42 y.o. female presents to office today For follow-up to insomnia.  Her last office visit with me was January 12, 2018 and at that appointment she was prescribed doxepin.  Today she reports she has since seen psychiatry for ADHD and hypomania.  She was taken off doxepin and placed on trazodone for insomnia as well as started on Lamictal for bipolar 1 disorder.  She has also tried JordanLatuda however she reports that she did not like the way the medication made her feel. She continues to take Celexa as well.  Today she reports significant improvement of symptoms with taking trazodone and Lamictal. She has no concerns or complaints today.   Review of Systems  Constitutional: Negative for fever, malaise/fatigue and weight loss.  HENT: Negative.  Negative for nosebleeds.   Eyes: Negative.  Negative for blurred vision, double vision and photophobia.  Respiratory: Negative.  Negative for cough and shortness of breath.   Cardiovascular: Negative.  Negative for chest pain, palpitations and leg swelling.  Gastrointestinal: Negative.  Negative for heartburn, nausea and vomiting.  Musculoskeletal: Negative.  Negative for myalgias.  Neurological: Negative.  Negative for dizziness, focal weakness, seizures and headaches.  Psychiatric/Behavioral: Negative for depression,  hallucinations, memory loss, substance abuse and suicidal ideas. The patient has insomnia.        See HPI    Past Medical History:  Diagnosis Date  . Anxiety   . Bipolar 1 disorder (HCC)   . Brain injury (HCC)   . Hypertension     Past Surgical History:  Procedure Laterality Date  . BRAIN SURGERY    . FRACTURE SURGERY      Family History  Problem Relation Age of Onset  . Bipolar disorder Mother   . Hypertension Mother   . Miscarriages / [place] Mother   . Vision loss Mother   . Varicose Veins Mother   . Alcohol abuse Father   . Heart disease Father   . Learning disabilities Father   . Bipolar disorder Sister   . Learning disabilities Sister   . Diabetes Paternal Aunt   . Bipolar disorder Maternal Grandmother   . Hypertension Maternal Grandfather   . Cancer Paternal Grandmother   . Cancer Paternal Grandfather     Social History Reviewed with no changes to be made today.   Outpatient Medications Prior to Visit  Medication Sig Dispense Refill  . citalopram (CELEXA) 40 MG tablet Take 1 tablet (40 mg total) by mouth daily. 90 tablet 2  . lamoTRIgine (LAMICTAL) 25 MG tablet Take one tab daily for 1 week and than 2 tab daily 60 tablet 1  . meloxicam (MOBIC) 15 MG tablet Take 1 tablet (15 mg total) by mouth daily. 30 tablet 2  . traZODone (DESYREL) 50 MG tablet Take 1 tablet (50 mg total) by mouth at bedtime. 90 tablet 0  No facility-administered medications prior to visit.     No Known Allergies     Objective:    BP 107/75 (BP Location: Right Arm, Patient Position: Sitting, Cuff Size: Large)   Pulse 72   Temp 98.3 F (36.8 C) (Oral)   Ht 5\' 10"  (1.778 m)   Wt 269 lb 3.2 oz (122.1 kg)   SpO2 96%   BMI 38.63 kg/m   Wt Readings from Last 3 Encounters:  03/14/18 269 lb 3.2 oz (122.1 kg)  02/11/18 271 lb 9.6 oz (123.2 kg)  01/21/18 270 lb (122.5 kg)    Physical Exam  Constitutional: She is oriented to person, place, and time. She appears well-developed  and well-nourished. She is cooperative.  HENT:  Head: Normocephalic and atraumatic.  Eyes: EOM are normal.  Neck: Normal range of motion.  Cardiovascular: Normal rate, regular rhythm and normal heart sounds. Exam reveals no gallop and no friction rub.  No murmur heard. Pulmonary/Chest: Effort normal and breath sounds normal. No tachypnea. No respiratory distress. She has no decreased breath sounds. She has no wheezes. She has no rhonchi. She has no rales. She exhibits no tenderness.  Abdominal: Soft. Bowel sounds are normal.  Musculoskeletal: Normal range of motion. She exhibits no edema.  Neurological: She is alert and oriented to person, place, and time. Coordination normal.  Skin: Skin is warm and dry.  Psychiatric: She has a normal mood and affect. Her speech is normal and behavior is normal. Judgment and thought content normal. She expresses no homicidal and no suicidal ideation. She expresses no suicidal plans and no homicidal plans.  Nursing note and vitals reviewed.      Patient has been counseled extensively about nutrition and exercise as well as the importance of adherence with medications and regular follow-up. The patient was given clear instructions to go to ER or return to medical center if symptoms don't improve, worsen or new problems develop. The patient verbalized understanding.   Follow-up: Return in about 2 months (around 05/20/2018) for Fasting labs/lipids.   Claiborne Rigg, FNP-BC [facility] [place], [place] [phone]   03/14/2018, 12:56 PM

## 2018-03-14 NOTE — Patient Instructions (Signed)
Bipolar 1 Disorder Bipolar 1 disorder is a mental health disorder in which a person has episodes of emotional highs (mania), and may also have episodes of emotional lows (depression) in addition to highs. Bipolar 1 disorder is different from other bipolar disorders because it involves extreme manic episodes. These episodes last at least one week or involve symptoms that are so severe that hospitalization is needed to keep the person safe. What increases the risk? The cause of this condition is not known. However, certain factors make you more likely to have bipolar disorder, such as:  Having a family member with the disorder.  An imbalance of certain chemicals in the brain (neurotransmitters).  Stress, such as illness, financial problems, or a death.  Certain conditions that affect the brain or spinal cord (neurologic conditions).  Brain injury (trauma).  Having another mental health disorder, such as: ? Obsessive compulsive disorder. ? Schizophrenia.  What are the signs or symptoms? Symptoms of mania include:  Very high self-esteem or self-confidence.  Decreased need for sleep.  Unusual talkativeness or feeling a need to keep talking. Speech may be very fast. It may seem like you cannot stop talking.  Racing thoughts or constant talking, with quick shifts between topics that may or may not be related (flight of ideas).  Decreased ability to focus or concentrate.  Increased purposeful activity, such as work, studies, or social activity.  Increased nonproductive activity. This could be pacing, squirming and fidgeting, or finger and toe tapping.  Impulsive behavior and poor judgment. This may result in high-risk activities, such as having unprotected sex or spending a lot of money.  Symptoms of depression include:  Feeling sad, hopeless, or helpless.  Frequent or uncontrollable crying.  Lack of feeling or caring about anything.  Sleeping too much.  Moving more slowly  than usual.  Not being able to enjoy things you used to enjoy.  Wanting to be alone all the time.  Feeling guilty or worthless.  Lack of energy or motivation.  Trouble concentrating or remembering.  Trouble making decisions.  Increased appetite.  Thoughts of death, or the desire to harm yourself.  Sometimes, you may have a mixed mood. This means having symptoms of depression and mania. Stress can make symptoms worse. How is this diagnosed? To diagnose bipolar disorder, your health care provider may ask about your:  Emotional episodes.  Medical history.  Alcohol and drug use. This includes prescription medicines. Certain medical conditions and substances can cause symptoms that seem like bipolar disorder (secondary bipolar disorder).  How is this treated? Bipolar disorder is a long-term (chronic) illness. It is best controlled with ongoing (continuous) treatment rather than treatment only when symptoms occur. Treatment may include:  Medicine. Medicine can be prescribed by a provider who specializes in treating mental disorders (psychiatrist). ? Medicines called mood stabilizers are usually prescribed. ? If symptoms occur even while taking a mood stabilizer, other medicines may be added.  Psychotherapy. Some forms of talk therapy, such as cognitive-behavioral therapy (CBT), can provide support, education, and guidance.  Coping methods, such as journaling or relaxation exercises. These may include: ? Yoga. ? Meditation. ? Deep breathing.  Lifestyle changes, such as: ? Limiting alcohol and drug use. ? Exercising regularly. ? Getting plenty of sleep. ? Making healthy eating choices.  A combination of medicine, talk therapy, and coping methods is best. A procedure in which electricity is applied to the brain through the scalp (electroconvulsive therapy) may be used in cases of severe mania when medicine   and psychotherapy work too slowly or do not work. Follow these  instructions at home: Activity   Return to your normal activities as told by your health care provider.  Find activities that you enjoy, and make time to do them.  Exercise regularly as told by your health care provider. Lifestyle  Limit alcohol intake to no more than 1 drink a day for nonpregnant women and 2 drinks a day for men. One drink equals 12 oz of beer, 5 oz of wine, or 1 oz of hard liquor.  Follow a set schedule for eating and sleeping.  Eat a balanced diet that includes fresh fruits and vegetables, whole grains, low-fat dairy, and lean meat.  Get 7-8 hours of sleep each night. General instructions  Take over-the-counter and prescription medicines only as told by your health care provider.  Think about joining a support group. Your health care provider may be able to recommend a support group.  Talk with your family and loved ones about your treatment goals and how they can help.  Keep all follow-up visits as told by your health care provider. This is important. Where to find more information: For more information about bipolar disorder, visit the following websites:  National Alliance on Mental Illness: www.nami.org  U.S. National Institute of Mental Health: www.nimh.nih.gov  Contact a health care provider if:  Your symptoms get worse.  You have side effects from your medicine, and they get worse.  You have trouble sleeping.  You have trouble doing daily activities.  You feel unsafe in your surroundings.  You are dealing with substance abuse. Get help right away if:  You have new symptoms.  You have thoughts about harming yourself.  You self-harm. This information is not intended to replace advice given to you by your health care provider. Make sure you discuss any questions you have with your health care provider. Document Released: 01/04/2001 Document Revised: 05/24/2016 Document Reviewed: 05/28/2016 Elsevier Interactive Patient Education  2018  [employer]  

## 2018-04-18 ENCOUNTER — Other Ambulatory Visit (HOSPITAL_COMMUNITY): Payer: Self-pay | Admitting: Psychiatry

## 2018-04-18 DIAGNOSIS — H40023 Open angle with borderline findings, high risk, bilateral: Secondary | ICD-10-CM | POA: Diagnosis not present

## 2018-04-21 ENCOUNTER — Other Ambulatory Visit (HOSPITAL_COMMUNITY): Payer: Self-pay

## 2018-04-21 MED ORDER — LAMOTRIGINE 25 MG PO TABS: ORAL_TABLET | ORAL | 1 refills | Status: DC

## 2018-05-13 ENCOUNTER — Other Ambulatory Visit: Payer: Self-pay | Admitting: Nurse Practitioner

## 2018-05-13 DIAGNOSIS — M25561 Pain in right knee: Secondary | ICD-10-CM

## 2018-05-18 ENCOUNTER — Ambulatory Visit: Payer: Medicaid Other | Attending: Nurse Practitioner | Admitting: Nurse Practitioner

## 2018-05-18 ENCOUNTER — Encounter: Payer: Self-pay | Admitting: Nurse Practitioner

## 2018-05-18 VITALS — BP 116/77 | HR 73 | Temp 97.9°F | Ht 70.0 in | Wt 260.8 lb

## 2018-05-18 DIAGNOSIS — F419 Anxiety disorder, unspecified: Secondary | ICD-10-CM | POA: Insufficient documentation

## 2018-05-18 DIAGNOSIS — G4709 Other insomnia: Secondary | ICD-10-CM

## 2018-05-18 DIAGNOSIS — Z79899 Other long term (current) drug therapy: Secondary | ICD-10-CM | POA: Insufficient documentation

## 2018-05-18 DIAGNOSIS — G4700 Insomnia, unspecified: Secondary | ICD-10-CM | POA: Diagnosis not present

## 2018-05-18 DIAGNOSIS — I10 Essential (primary) hypertension: Secondary | ICD-10-CM | POA: Diagnosis not present

## 2018-05-18 DIAGNOSIS — E782 Mixed hyperlipidemia: Secondary | ICD-10-CM | POA: Insufficient documentation

## 2018-05-18 DIAGNOSIS — F319 Bipolar disorder, unspecified: Secondary | ICD-10-CM | POA: Diagnosis not present

## 2018-05-18 DIAGNOSIS — Z8249 Family history of ischemic heart disease and other diseases of the circulatory system: Secondary | ICD-10-CM | POA: Diagnosis not present

## 2018-05-18 MED ORDER — TRAZODONE HCL 100 MG PO TABS: 100.0000 mg | ORAL_TABLET | Freq: Every day | ORAL | 1 refills | Status: DC

## 2018-05-18 MED ORDER — LAMOTRIGINE 25 MG PO TABS: ORAL_TABLET | ORAL | 1 refills | Status: DC

## 2018-05-18 NOTE — Progress Notes (Signed)
Assessment & Plan:  Tran was seen today for follow-up.  Diagnoses and all orders for this visit:  Mixed hyperlipidemia -     Lipid panel  Other insomnia -     traZODone (DESYREL) 100 MG tablet; Take 1 tablet (100 mg total) by mouth at bedtime.    Patient has been counseled on age-appropriate routine health concerns for screening and prevention. These are reviewed and up-to-date. Referrals have been placed accordingly. Immunizations are up-to-date or declined.    Subjective:   Chief Complaint  Patient presents with  . Follow-up    Pt. is here for fasting labs.    HPI Sarah Salas 42 y.o. female presents to office today for follow up. Currently being followed by Psychiatry Dr. Lolly Mustache for her Bipolar Disorder. Taking Latuda.   Insomnia She was prescribed trazodone at her last office appointment with me 03-14-2018. She endorses "some" improvement with 50mg . Will increase to 100mg  today. She is agreeable. Will check ECG at next office appointment. As she is also taking Celexa.   Review of Systems  Constitutional: Negative for fever, malaise/fatigue and weight loss.  HENT: Negative.  Negative for nosebleeds.   Eyes: Negative.  Negative for blurred vision, double vision and photophobia.  Respiratory: Negative.  Negative for cough and shortness of breath.   Cardiovascular: Negative.  Negative for chest pain, palpitations and leg swelling.  Gastrointestinal: Negative.  Negative for heartburn, nausea and vomiting.  Musculoskeletal: Negative.  Negative for myalgias.  Neurological: Negative.  Negative for dizziness, focal weakness, seizures and headaches.  Psychiatric/Behavioral: Negative for suicidal ideas. The patient is nervous/anxious and has insomnia.        BIPOLAR D/O ADHD     Past Medical History:  Diagnosis Date  . Anxiety   . Bipolar 1 disorder (HCC)   . Brain injury (HCC)   . Hypertension     Past Surgical History:  Procedure Laterality Date  . BRAIN SURGERY      . FRACTURE SURGERY      Family History  Problem Relation Age of Onset  . Bipolar disorder Mother   . Hypertension Mother   . Miscarriages / [place] Mother   . Vision loss Mother   . Varicose Veins Mother   . Alcohol abuse Father   . Heart disease Father   . Learning disabilities Father   . Bipolar disorder Sister   . Learning disabilities Sister   . Diabetes Paternal Aunt   . Bipolar disorder Maternal Grandmother   . Hypertension Maternal Grandfather   . Cancer Paternal Grandmother   . Cancer Paternal Grandfather     Social History Reviewed with no changes to be made today.   Outpatient Medications Prior to Visit  Medication Sig Dispense Refill  . lamoTRIgine (LAMICTAL) 25 MG tablet Take 2 tab daily 60 tablet 1  . meloxicam (MOBIC) 15 MG tablet TAKE 1 TABLET BY MOUTH ONCE DAILY 30 tablet 0  . traZODone (DESYREL) 50 MG tablet Take 1 tablet (50 mg total) by mouth at bedtime. 90 tablet 0  . citalopram (CELEXA) 40 MG tablet Take 1 tablet (40 mg total) by mouth daily. 90 tablet 2   No facility-administered medications prior to visit.     No Known Allergies     Objective:    BP 116/77 (BP Location: Right Arm, Patient Position: Sitting, Cuff Size: Large)   Pulse 73   Temp 97.9 F (36.6 C) (Oral)   Ht 5\' 10"  (1.778 m)   Wt 260 lb 12.8  oz (118.3 kg)   SpO2 99%   BMI 37.42 kg/m  Wt Readings from Last 3 Encounters:  05/18/18 260 lb 12.8 oz (118.3 kg)  03/14/18 269 lb 3.2 oz (122.1 kg)  01/12/18 266 lb 9.6 oz (120.9 kg)    Physical Exam  Constitutional: She is oriented to person, place, and time. She appears well-developed and well-nourished. She is cooperative.  HENT:  Head: Normocephalic and atraumatic.  Eyes: EOM are normal.  Neck: Normal range of motion.  Cardiovascular: Normal rate, regular rhythm, normal heart sounds and intact distal pulses. Exam reveals no gallop and no friction rub.  No murmur heard. Pulmonary/Chest: Effort normal and breath sounds  normal. No tachypnea. No respiratory distress. She has no decreased breath sounds. She has no wheezes. She has no rhonchi. She has no rales. She exhibits no tenderness.  Abdominal: Soft. Bowel sounds are normal.  Musculoskeletal: Normal range of motion. She exhibits no edema.  Neurological: She is alert and oriented to person, place, and time. Coordination normal.  Skin: Skin is warm and dry.  Psychiatric: She has a normal mood and affect. Her speech is normal and behavior is normal. Judgment and thought content normal. She expresses no homicidal and no suicidal ideation. She expresses no suicidal plans and no homicidal plans.  Nursing note and vitals reviewed.      Patient has been counseled extensively about nutrition and exercise as well as the importance of adherence with medications and regular follow-up. The patient was given clear instructions to go to ER or return to medical center if symptoms don't improve, worsen or new problems develop. The patient verbalized understanding.   Follow-up: Return if symptoms worsen or fail to improve.   Claiborne RiggZelda W Caela Huot, FNP-BC [facility] [place], [place] [phone]   05/18/2018, 9:22 AM

## 2018-05-18 NOTE — Patient Instructions (Addendum)
Bipolar 1 Disorder Bipolar 1 disorder is a mental health disorder in which a person has episodes of emotional highs (mania), and may also have episodes of emotional lows (depression) in addition to highs. Bipolar 1 disorder is different from other bipolar disorders because it involves extreme manic episodes. These episodes last at least one week or involve symptoms that are so severe that hospitalization is needed to keep the person safe. What increases the risk? The cause of this condition is not known. However, certain factors make you more likely to have bipolar disorder, such as:  Having a family member with the disorder.  An imbalance of certain chemicals in the brain (neurotransmitters).  Stress, such as illness, financial problems, or a death.  Certain conditions that affect the brain or spinal cord (neurologic conditions).  Brain injury (trauma).  Having another mental health disorder, such as: ? Obsessive compulsive disorder. ? Schizophrenia.  What are the signs or symptoms? Symptoms of mania include:  Very high self-esteem or self-confidence.  Decreased need for sleep.  Unusual talkativeness or feeling a need to keep talking. Speech may be very fast. It may seem like you cannot stop talking.  Racing thoughts or constant talking, with quick shifts between topics that may or may not be related (flight of ideas).  Decreased ability to focus or concentrate.  Increased purposeful activity, such as work, studies, or social activity.  Increased nonproductive activity. This could be pacing, squirming and fidgeting, or finger and toe tapping.  Impulsive behavior and poor judgment. This may result in high-risk activities, such as having unprotected sex or spending a lot of money.  Symptoms of depression include:  Feeling sad, hopeless, or helpless.  Frequent or uncontrollable crying.  Lack of feeling or caring about anything.  Sleeping too much.  Moving more slowly  than usual.  Not being able to enjoy things you used to enjoy.  Wanting to be alone all the time.  Feeling guilty or worthless.  Lack of energy or motivation.  Trouble concentrating or remembering.  Trouble making decisions.  Increased appetite.  Thoughts of death, or the desire to harm yourself.  Sometimes, you may have a mixed mood. This means having symptoms of depression and mania. Stress can make symptoms worse. How is this diagnosed? To diagnose bipolar disorder, your health care provider may ask about your:  Emotional episodes.  Medical history.  Alcohol and drug use. This includes prescription medicines. Certain medical conditions and substances can cause symptoms that seem like bipolar disorder (secondary bipolar disorder).  How is this treated? Bipolar disorder is a long-term (chronic) illness. It is best controlled with ongoing (continuous) treatment rather than treatment only when symptoms occur. Treatment may include:  Medicine. Medicine can be prescribed by a provider who specializes in treating mental disorders (psychiatrist). ? Medicines called mood stabilizers are usually prescribed. ? If symptoms occur even while taking a mood stabilizer, other medicines may be added.  Psychotherapy. Some forms of talk therapy, such as cognitive-behavioral therapy (CBT), can provide support, education, and guidance.  Coping methods, such as journaling or relaxation exercises. These may include: ? Yoga. ? Meditation. ? Deep breathing.  Lifestyle changes, such as: ? Limiting alcohol and drug use. ? Exercising regularly. ? Getting plenty of sleep. ? Making healthy eating choices.  A combination of medicine, talk therapy, and coping methods is best. A procedure in which electricity is applied to the brain through the scalp (electroconvulsive therapy) may be used in cases of severe mania when medicine   and psychotherapy work too slowly or do not work. Follow these  instructions at home: Activity   Return to your normal activities as told by your health care provider.  Find activities that you enjoy, and make time to do them.  Exercise regularly as told by your health care provider. Lifestyle  Limit alcohol intake to no more than 1 drink a day for nonpregnant women and 2 drinks a day for men. One drink equals 12 oz of beer, 5 oz of wine, or 1 oz of hard liquor.  Follow a set schedule for eating and sleeping.  Eat a balanced diet that includes fresh fruits and vegetables, whole grains, low-fat dairy, and lean meat.  Get 7-8 hours of sleep each night. General instructions  Take over-the-counter and prescription medicines only as told by your health care provider.  Think about joining a support group. Your health care provider may be able to recommend a support group.  Talk with your family and loved ones about your treatment goals and how they can help.  Keep all follow-up visits as told by your health care provider. This is important. Where to find more information: For more information about bipolar disorder, visit the following websites:  The [employer] on Mental Illness: www.nami.org  U.[employer] Institute of Mental Health: http://www.maynard.net/www.nimh.nih.gov  Contact a health care provider if:  Your symptoms get worse.  You have side effects from your medicine, and they get worse.  You have trouble sleeping.  You have trouble doing daily activities.  You feel unsafe in your surroundings.  You are dealing with substance abuse. Get help right away if:  You have new symptoms.  You have thoughts about harming yourself.  You self-harm. This information is not intended to replace advice given to you by your health care provider. Make sure you discuss any questions you have with your health care provider. Document Released: 01/04/2001 Document Revised: 05/24/2016 Document Reviewed: 05/28/2016 Elsevier Interactive Patient Education  2018  [employer] Insomnia Insomnia is a sleep disorder that makes it difficult to fall asleep or to stay asleep. Insomnia can cause tiredness (fatigue), low energy, difficulty concentrating, mood swings, and poor performance at work or school. There are three different ways to classify insomnia:  Difficulty falling asleep.  Difficulty staying asleep.  Waking up too early in the morning.  Any type of insomnia can be long-term (chronic) or short-term (acute). Both are common. Short-term insomnia usually lasts for three months or less. Chronic insomnia occurs at least three times a week for longer than three months. What are the causes? Insomnia may be caused by another condition, situation, or substance, such as:  Anxiety.  Certain medicines.  Gastroesophageal reflux disease (GERD) or other gastrointestinal conditions.  Asthma or other breathing conditions.  Restless legs syndrome, sleep apnea, or other sleep disorders.  Chronic pain.  Menopause. This may include hot flashes.  Stroke.  Abuse of alcohol, tobacco, or illegal drugs.  Depression.  Caffeine.  Neurological disorders, such as Alzheimer disease.  An overactive thyroid (hyperthyroidism).  The cause of insomnia may not be known. What increases the risk? Risk factors for insomnia include:  Gender. Women are more commonly affected than men.  Age. Insomnia is more common as you get older.  Stress. This may involve your professional or personal life.  Income. Insomnia is more common in people with lower income.  Lack of exercise.  Irregular work schedule or night shifts.  Traveling between different time zones.  What are the signs  or symptoms? If you have insomnia, trouble falling asleep or trouble staying asleep is the main symptom. This may lead to other symptoms, such as:  Feeling fatigued.  Feeling nervous about going to sleep.  Not feeling rested in the morning.  Having trouble  concentrating.  Feeling irritable, anxious, or depressed.  How is this treated? Treatment for insomnia depends on the cause. If your insomnia is caused by an underlying condition, treatment will focus on addressing the condition. Treatment may also include:  Medicines to help you sleep.  Counseling or therapy.  Lifestyle adjustments.  Follow these instructions at home:  Take medicines only as directed by your health care provider.  Keep regular sleeping and waking hours. Avoid naps.  Keep a sleep diary to help you and your health care provider figure out what could be causing your insomnia. Include: ? When you sleep. ? When you wake up during the night. ? How well you sleep. ? How rested you feel the next day. ? Any side effects of medicines you are taking. ? What you eat and drink.  Make your bedroom a comfortable place where it is easy to fall asleep: ? Put up shades or special blackout curtains to block light from outside. ? Use a white noise machine to block noise. ? Keep the temperature cool.  Exercise regularly as directed by your health care provider. Avoid exercising right before bedtime.  Use relaxation techniques to manage stress. Ask your health care provider to suggest some techniques that may work well for you. These may include: ? Breathing exercises. ? Routines to release muscle tension. ? Visualizing peaceful scenes.  Cut back on alcohol, caffeinated beverages, and cigarettes, especially close to bedtime. These can disrupt your sleep.  Do not overeat or eat spicy foods right before bedtime. This can lead to digestive discomfort that can make it hard for you to sleep.  Limit screen use before bedtime. This includes: ? Watching TV. ? Using your smartphone, tablet, and computer.  Stick to a routine. This can help you fall asleep faster. Try to do a quiet activity, brush your teeth, and go to bed at the same time each night.  Get out of bed if you are  still awake after 15 minutes of trying to sleep. Keep the lights down, but try reading or doing a quiet activity. When you feel sleepy, go back to bed.  Make sure that you drive carefully. Avoid driving if you feel very sleepy.  Keep all follow-up appointments as directed by your health care provider. This is important. Contact a health care provider if:  You are tired throughout the day or have trouble in your daily routine due to sleepiness.  You continue to have sleep problems or your sleep problems get worse. Get help right away if:  You have serious thoughts about hurting yourself or someone else. This information is not intended to replace advice given to you by your health care provider. Make sure you discuss any questions you have with your health care provider. Document Released: 09/25/2000 Document Revised: 02/28/2016 Document Reviewed: 06/29/2014 Elsevier Interactive Patient Education  [employer].

## 2018-05-19 LAB — LIPID PANEL
CHOLESTEROL TOTAL: 238 mg/dL — AB (ref 100–199)
Chol/HDL Ratio: 6 ratio — ABNORMAL HIGH (ref 0.0–4.4)
HDL: 40 mg/dL (ref >39)
LDL CALC: 176 mg/dL — AB (ref 0–99)
Triglycerides: 109 mg/dL (ref 0–149)
VLDL Cholesterol Cal: 22 mg/dL (ref 5–40)

## 2018-05-20 ENCOUNTER — Encounter: Payer: Self-pay | Admitting: Nurse Practitioner

## 2018-05-20 ENCOUNTER — Ambulatory Visit (HOSPITAL_COMMUNITY): Payer: Self-pay | Admitting: Psychiatry

## 2018-05-24 ENCOUNTER — Other Ambulatory Visit: Payer: Self-pay | Admitting: Nurse Practitioner

## 2018-05-25 ENCOUNTER — Encounter: Payer: Self-pay | Admitting: Nurse Practitioner

## 2018-05-26 ENCOUNTER — Other Ambulatory Visit: Payer: Self-pay | Admitting: Nurse Practitioner

## 2018-05-26 MED ORDER — PHENTERMINE HCL 37.5 MG PO TABS: 37.5000 mg | ORAL_TABLET | Freq: Every day | ORAL | 1 refills | Status: DC

## 2018-05-30 ENCOUNTER — Encounter: Payer: Self-pay | Admitting: Nurse Practitioner

## 2018-05-31 ENCOUNTER — Encounter: Payer: Self-pay | Admitting: Nurse Practitioner

## 2018-06-02 DIAGNOSIS — H401131 Primary open-angle glaucoma, bilateral, mild stage: Secondary | ICD-10-CM | POA: Diagnosis not present

## 2018-06-09 ENCOUNTER — Ambulatory Visit: Payer: Medicaid Other | Admitting: Obstetrics & Gynecology

## 2018-06-09 VITALS — BP 126/77 | HR 84 | Wt 261.0 lb

## 2018-06-09 DIAGNOSIS — Z30433 Encounter for removal and reinsertion of intrauterine contraceptive device: Secondary | ICD-10-CM | POA: Diagnosis not present

## 2018-06-09 DIAGNOSIS — Z975 Presence of (intrauterine) contraceptive device: Secondary | ICD-10-CM

## 2018-06-09 MED ORDER — LEVONORGESTREL 20 MCG/24HR IU IUD
INTRAUTERINE_SYSTEM | Freq: Once | INTRAUTERINE | Status: AC
  Administered 2018-06-09: 13:00:00 via INTRAUTERINE

## 2018-06-09 NOTE — Patient Instructions (Signed)

## 2018-06-09 NOTE — Progress Notes (Signed)
    GYNECOLOGY OFFICE PROCEDURE NOTE  Sarah Salas is a 42 y.o. G0P0000 here for Mirena IUD replacement. No GYN concerns.  Last pap smear was on 11/2017 and was normal.Mirena  IUD Insertion Procedure Note Patient identified, informed consent performed, consent signed.   Discussed risks of irregular bleeding, cramping, infection, malpositioning or misplacement of the IUD outside the uterus which may require further procedure such as laparoscopy. Time out was performed.  Urine pregnancy test negative.  Speculum placed in the vagina.  Cervix visualized.  IUD string was grasped and the device was removed intact. Cleaned with Betadine x 2.  Grasped anteriorly with a single tooth tenaculum.  Uterus sounded to 8 cm. Mirena IUD placed per manufacturer's recommendations.  Strings trimmed to 3 cm. Tenaculum was removed, good hemostasis noted.  Patient tolerated procedure well.   Patient was given post-procedure instructions.  Patient was also asked to check IUD strings periodically and follow up in 4 weeks for IUD check.   Scheryl DarterJAMES Aaralyn Kil, MD, FACOG Attending Obstetrician & Gynecologist, [facility], [facility]

## 2018-06-12 ENCOUNTER — Encounter: Payer: Self-pay | Admitting: Nurse Practitioner

## 2018-06-15 ENCOUNTER — Encounter: Payer: Self-pay | Admitting: Nurse Practitioner

## 2018-06-15 ENCOUNTER — Other Ambulatory Visit (HOSPITAL_COMMUNITY): Payer: Self-pay | Admitting: Psychiatry

## 2018-06-20 ENCOUNTER — Other Ambulatory Visit (HOSPITAL_COMMUNITY): Payer: Self-pay | Admitting: Psychiatry

## 2018-06-21 ENCOUNTER — Encounter: Payer: Self-pay | Admitting: Nurse Practitioner

## 2018-06-21 ENCOUNTER — Other Ambulatory Visit: Payer: Self-pay | Admitting: Nurse Practitioner

## 2018-06-21 DIAGNOSIS — M25561 Pain in right knee: Secondary | ICD-10-CM

## 2018-06-23 ENCOUNTER — Other Ambulatory Visit (HOSPITAL_COMMUNITY): Payer: Self-pay

## 2018-06-23 MED ORDER — LAMOTRIGINE 25 MG PO TABS: ORAL_TABLET | ORAL | 0 refills | Status: DC

## 2018-06-24 ENCOUNTER — Encounter: Payer: Self-pay | Admitting: Nurse Practitioner

## 2018-06-25 ENCOUNTER — Encounter: Payer: Self-pay | Admitting: Nurse Practitioner

## 2018-06-29 ENCOUNTER — Ambulatory Visit (HOSPITAL_COMMUNITY): Payer: Self-pay | Admitting: Psychiatry

## 2018-07-10 ENCOUNTER — Encounter: Payer: Self-pay | Admitting: Nurse Practitioner

## 2018-07-11 ENCOUNTER — Encounter: Payer: Self-pay | Admitting: Nurse Practitioner

## 2018-07-11 NOTE — Telephone Encounter (Signed)
Mychart medication concern

## 2018-07-12 ENCOUNTER — Telehealth (HOSPITAL_COMMUNITY): Payer: Self-pay

## 2018-07-12 ENCOUNTER — Other Ambulatory Visit (HOSPITAL_COMMUNITY): Payer: Self-pay | Admitting: Psychiatry

## 2018-07-12 NOTE — Telephone Encounter (Signed)
Patient called saying she is very short "fused" lately. She doesn't know why this is going on but she needs some advise. No future appointment is scheduled.

## 2018-07-12 NOTE — Telephone Encounter (Signed)
I have not seen her since May 2019.  She need to schedule appointment.  In the meantime she can try taking Lamictal 25 mg 3 tablet daily.  She need to watch carefully for rash and in that case she need to stop the medication immediately.

## 2018-07-13 NOTE — Telephone Encounter (Signed)
Called and left voicemail message for patient 

## 2018-07-14 ENCOUNTER — Other Ambulatory Visit (HOSPITAL_COMMUNITY): Payer: Self-pay

## 2018-07-14 ENCOUNTER — Encounter: Payer: Self-pay | Admitting: Nurse Practitioner

## 2018-07-14 MED ORDER — LAMOTRIGINE 25 MG PO TABS: 75.0000 mg | ORAL_TABLET | Freq: Every day | ORAL | 0 refills | Status: DC

## 2018-07-15 NOTE — Telephone Encounter (Signed)
Mychart message

## 2018-07-18 ENCOUNTER — Encounter: Payer: Self-pay | Admitting: Nurse Practitioner

## 2018-07-18 DIAGNOSIS — Z23 Encounter for immunization: Secondary | ICD-10-CM | POA: Diagnosis not present

## 2018-07-19 ENCOUNTER — Other Ambulatory Visit: Payer: Self-pay | Admitting: Nurse Practitioner

## 2018-07-19 DIAGNOSIS — F419 Anxiety disorder, unspecified: Secondary | ICD-10-CM

## 2018-07-19 MED ORDER — CITALOPRAM HYDROBROMIDE 40 MG PO TABS: 40.0000 mg | ORAL_TABLET | Freq: Every day | ORAL | 2 refills | Status: DC

## 2018-07-19 MED ORDER — PHENTERMINE HCL 37.5 MG PO TABS: 37.5000 mg | ORAL_TABLET | Freq: Every day | ORAL | 1 refills | Status: DC

## 2018-07-19 NOTE — Telephone Encounter (Signed)
Refill request

## 2018-08-01 ENCOUNTER — Encounter: Payer: Self-pay | Admitting: Nurse Practitioner

## 2018-08-01 NOTE — Telephone Encounter (Signed)
Patient medication mychart concern

## 2018-08-03 ENCOUNTER — Encounter: Payer: Self-pay | Admitting: Nurse Practitioner

## 2018-08-06 ENCOUNTER — Encounter: Payer: Self-pay | Admitting: Nurse Practitioner

## 2018-08-16 ENCOUNTER — Ambulatory Visit: Payer: Medicaid Other | Attending: Nurse Practitioner | Admitting: Nurse Practitioner

## 2018-08-16 ENCOUNTER — Encounter: Payer: Self-pay | Admitting: Nurse Practitioner

## 2018-08-16 VITALS — BP 122/87 | HR 85 | Temp 98.1°F | Ht 70.0 in | Wt 252.6 lb

## 2018-08-16 DIAGNOSIS — F319 Bipolar disorder, unspecified: Secondary | ICD-10-CM | POA: Diagnosis not present

## 2018-08-16 DIAGNOSIS — Z9889 Other specified postprocedural states: Secondary | ICD-10-CM | POA: Diagnosis not present

## 2018-08-16 DIAGNOSIS — E669 Obesity, unspecified: Secondary | ICD-10-CM | POA: Diagnosis not present

## 2018-08-16 DIAGNOSIS — Z833 Family history of diabetes mellitus: Secondary | ICD-10-CM | POA: Insufficient documentation

## 2018-08-16 DIAGNOSIS — I10 Essential (primary) hypertension: Secondary | ICD-10-CM | POA: Diagnosis not present

## 2018-08-16 DIAGNOSIS — M25562 Pain in left knee: Secondary | ICD-10-CM | POA: Insufficient documentation

## 2018-08-16 DIAGNOSIS — M25569 Pain in unspecified knee: Secondary | ICD-10-CM | POA: Diagnosis present

## 2018-08-16 DIAGNOSIS — Z8249 Family history of ischemic heart disease and other diseases of the circulatory system: Secondary | ICD-10-CM | POA: Insufficient documentation

## 2018-08-16 DIAGNOSIS — Z79899 Other long term (current) drug therapy: Secondary | ICD-10-CM | POA: Insufficient documentation

## 2018-08-16 DIAGNOSIS — F419 Anxiety disorder, unspecified: Secondary | ICD-10-CM | POA: Insufficient documentation

## 2018-08-16 DIAGNOSIS — Z7689 Persons encountering health services in other specified circumstances: Secondary | ICD-10-CM

## 2018-08-16 DIAGNOSIS — Z6836 Body mass index (BMI) 36.0-36.9, adult: Secondary | ICD-10-CM | POA: Insufficient documentation

## 2018-08-16 MED ORDER — PHENTERMINE HCL 37.5 MG PO TABS: 37.5000 mg | ORAL_TABLET | Freq: Every day | ORAL | 3 refills | Status: DC

## 2018-08-16 NOTE — Progress Notes (Signed)
Assessment & Plan:  Sarah Salas was seen today for knee pain.  Diagnoses and all orders for this visit:  Encounter for weight management -     phentermine (ADIPEX-P) 37.5 MG tablet; Take 1 tablet (37.5 mg total) by mouth daily before breakfast.    Patient has been counseled on age-appropriate routine health concerns for screening and prevention. These are reviewed and up-to-date. Referrals have been placed accordingly. Immunizations are up-to-date or declined.    Subjective:   Chief Complaint  Patient presents with  . Knee Pain    Pt. stated she fell on her left knee. Pt. stated she is not having any knee pain currently.    HPI Sarah Salas 42 y.o. female presents to office today for obesity. She is currently taking phentermine 37.5mg  daily for weight loss. She has lost 18lbs since January. Doing well. Reports some dryness in her throat with swallowing since she has been taking phentermine. She is exercising. Walking a mile a day and staying active. Will refill and continue to follow.  She denies chest pain, shortness of breath, palpitations, lightheadedness, dizziness, headaches or BLE edema.    Review of Systems  Constitutional: Negative for fever, malaise/fatigue and weight loss.  HENT: Negative for nosebleeds.        SEE HPI  Eyes: Negative.  Negative for blurred vision, double vision and photophobia.  Respiratory: Negative.  Negative for cough and shortness of breath.   Cardiovascular: Negative.  Negative for chest pain, palpitations and leg swelling.  Gastrointestinal: Negative.  Negative for heartburn, nausea and vomiting.  Musculoskeletal: Negative.  Negative for myalgias.  Neurological: Negative.  Negative for dizziness, focal weakness, seizures and headaches.  Psychiatric/Behavioral: Negative.  Negative for suicidal ideas.    Past Medical History:  Diagnosis Date  . Anxiety   . Bipolar 1 disorder (HCC)   . Brain injury (HCC)   . Hypertension     Past Surgical  History:  Procedure Laterality Date  . BRAIN SURGERY    . FRACTURE SURGERY      Family History  Problem Relation Age of Onset  . Bipolar disorder Mother   . Hypertension Mother   . Miscarriages / [place] Mother   . Vision loss Mother   . Varicose Veins Mother   . Alcohol abuse Father   . Heart disease Father   . Learning disabilities Father   . Bipolar disorder Sister   . Learning disabilities Sister   . Diabetes Paternal Aunt   . Bipolar disorder Maternal Grandmother   . Hypertension Maternal Grandfather   . Cancer Paternal Grandmother   . Cancer Paternal Grandfather     Social History Reviewed with no changes to be made today.   Outpatient Medications Prior to Visit  Medication Sig Dispense Refill  . citalopram (CELEXA) 40 MG tablet Take 1 tablet (40 mg total) by mouth daily. 90 tablet 2  . lamoTRIgine (LAMICTAL) 25 MG tablet TAKE 2 TABLETS BY MOUTH ONCE DAILY 60 tablet 1  . lamoTRIgine (LAMICTAL) 25 MG tablet Take 3 tablets (75 mg total) by mouth daily. 90 tablet 0  . traZODone (DESYREL) 100 MG tablet Take 1 tablet (100 mg total) by mouth at bedtime. 90 tablet 1  . phentermine (ADIPEX-P) 37.5 MG tablet Take 1 tablet (37.5 mg total) by mouth daily before breakfast. 30 tablet 1  . meloxicam (MOBIC) 15 MG tablet TAKE 1 TABLET BY MOUTH ONCE DAILY (Patient not taking: Reported on 08/16/2018) 30 tablet 1   No facility-administered medications prior  to visit.     No Known Allergies     Objective:    BP 122/87 (BP Location: Right Arm, Patient Position: Sitting, Cuff Size: Normal)   Pulse 85   Temp 98.1 F (36.7 C) (Oral)   Ht 5\' 10"  (1.778 m)   Wt 252 lb 9.6 oz (114.6 kg)   SpO2 97%   BMI 36.24 kg/m  Wt Readings from Last 3 Encounters:  08/16/18 252 lb 9.6 oz (114.6 kg)  06/09/18 261 lb (118.4 kg)  05/18/18 260 lb 12.8 oz (118.3 kg)    Physical Exam  Constitutional: She is oriented to person, place, and time. She appears well-developed and well-nourished. She  is cooperative.  HENT:  Head: Normocephalic and atraumatic.  Eyes: EOM are normal.  Neck: Normal range of motion.  Cardiovascular: Normal rate, regular rhythm and normal heart sounds. Exam reveals no gallop and no friction rub.  No murmur heard. Pulmonary/Chest: Effort normal and breath sounds normal. No tachypnea. No respiratory distress. She has no decreased breath sounds. She has no wheezes. She has no rhonchi. She has no rales. She exhibits no tenderness.  Abdominal: Bowel sounds are normal.  Musculoskeletal: Normal range of motion. She exhibits no edema.  Neurological: She is alert and oriented to person, place, and time. Coordination normal.  Skin: Skin is warm and dry.  Psychiatric: She has a normal mood and affect. Her behavior is normal. Judgment and thought content normal.  Nursing note and vitals reviewed.      Patient has been counseled extensively about nutrition and exercise as well as the importance of adherence with medications and regular follow-up. The patient was given clear instructions to go to ER or return to medical center if symptoms don't improve, worsen or new problems develop. The patient verbalized understanding.   Follow-up: Return in 3 months (on 11/16/2018).   Claiborne Rigg, FNP-BC [facility] [place], [place] [phone]   08/16/2018, 1:28 PM

## 2018-08-17 ENCOUNTER — Other Ambulatory Visit (HOSPITAL_COMMUNITY): Payer: Self-pay | Admitting: Psychiatry

## 2018-08-18 ENCOUNTER — Encounter: Payer: Self-pay | Admitting: Nurse Practitioner

## 2018-08-19 ENCOUNTER — Other Ambulatory Visit (HOSPITAL_COMMUNITY): Payer: Self-pay

## 2018-08-19 MED ORDER — LAMOTRIGINE 25 MG PO TABS: 75.0000 mg | ORAL_TABLET | Freq: Every day | ORAL | 0 refills | Status: DC

## 2018-09-12 ENCOUNTER — Other Ambulatory Visit (HOSPITAL_COMMUNITY): Payer: Self-pay

## 2018-09-12 MED ORDER — LAMOTRIGINE 25 MG PO TABS: 75.0000 mg | ORAL_TABLET | Freq: Every day | ORAL | 0 refills | Status: DC

## 2018-10-03 ENCOUNTER — Telehealth: Payer: Self-pay | Admitting: Nurse Practitioner

## 2018-10-03 DIAGNOSIS — H401131 Primary open-angle glaucoma, bilateral, mild stage: Secondary | ICD-10-CM | POA: Diagnosis not present

## 2018-10-03 NOTE — Telephone Encounter (Signed)
Patient needs a referral for glaucoma to be checked in order for the insurance to pay.

## 2018-10-07 NOTE — Telephone Encounter (Signed)
Glaucoma is not usually screened until the age of 40. Please ask the patient why we are referring for screening of  Glaucoma. Thank you

## 2018-10-10 NOTE — Telephone Encounter (Signed)
The Eye doctor is who called asking for the referral. They stated that they screen everyone for glaucoma because it can be at any age. Patient does have glaucoma and has to have a referral for insurance to pay. Please send referral to [facility] at [phone] and fax number is [phone]

## 2018-10-12 ENCOUNTER — Other Ambulatory Visit: Payer: Self-pay | Admitting: Nurse Practitioner

## 2018-10-12 DIAGNOSIS — Z0100 Encounter for examination of eyes and vision without abnormal findings: Secondary | ICD-10-CM

## 2018-10-12 NOTE — Telephone Encounter (Signed)
Referral has been placed. 

## 2018-10-19 ENCOUNTER — Ambulatory Visit (INDEPENDENT_AMBULATORY_CARE_PROVIDER_SITE_OTHER): Payer: Medicaid Other | Admitting: Psychiatry

## 2018-10-19 ENCOUNTER — Encounter (HOSPITAL_COMMUNITY): Payer: Self-pay | Admitting: Psychiatry

## 2018-10-19 VITALS — BP 137/74 | HR 90 | Ht 70.0 in | Wt 251.0 lb

## 2018-10-19 DIAGNOSIS — F319 Bipolar disorder, unspecified: Secondary | ICD-10-CM

## 2018-10-19 DIAGNOSIS — F419 Anxiety disorder, unspecified: Secondary | ICD-10-CM | POA: Diagnosis not present

## 2018-10-19 MED ORDER — LAMOTRIGINE 100 MG PO TABS: 100.0000 mg | ORAL_TABLET | Freq: Every day | ORAL | 0 refills | Status: DC

## 2018-10-19 NOTE — Progress Notes (Signed)
BH MD/PA/NP OP Progress Note  10/19/2018 10:21 AM Sarah Salas  MRN:  [id number]  Chief Complaint: I like Lamictal but I still struggle with irritability and mood swings.  HPI: Patient came for her follow-up appointment.  She was last seen in May 2019.  She was started on Lamictal and she is now taking 75 mg.  Dose increase in October on the phone.  She admitted that increase Lamictal help her mood irritability but she continues to struggle with anxiety and racing thoughts.  She gets easily irritable and lose her temper.  She continued to have poor attention concentration and hyperactivity but does not want ADD medication because it makes her more anxious.  She admitted struggle to discipline her 41 year old niece who lives with them since age 722.  Patient told her younger sister cannot take care herself and her daughter.  She is sleeping better with trazodone.  She is taking trazodone and Celexa prescribed by her primary care physician.  She is no longer taking Latuda which made her worse when the dose increase.  Patient denies any hallucination, paranoia, suicidal thoughts or homicidal thought.  She is not drinking or using any illegal substances.  She lives with her mother, stepfather and her 41 year old niece.  Patient is not interested in therapy.  Visit Diagnosis:    ICD-10-CM   1. Bipolar I disorder (HCC) F31.9 lamoTRIgine (LAMICTAL) 100 MG tablet  2. Anxiety F41.9 lamoTRIgine (LAMICTAL) 100 MG tablet    Past Psychiatric History: Reviewed. History of ADHD and took Adderall when she was in college.  Diagnosed bipolar disorder and tried doxepin, Vistaril with poor outcome.  Latuda help in the beginning but when dose increase symptoms got worse.  No history of paranoia, psychiatric inpatient treatment or any suicidal attempt.     Past Medical History:  Past Medical History:  Diagnosis Date  . Anxiety   . Bipolar 1 disorder (HCC)   . Brain injury (HCC)   . Hypertension     Past Surgical  History:  Procedure Laterality Date  . BRAIN SURGERY    . FRACTURE SURGERY      Family Psychiatric History: Reviewed.  Family History:  Family History  Problem Relation Age of Onset  . Bipolar disorder Mother   . Hypertension Mother   . Miscarriages / [place] Mother   . Vision loss Mother   . Varicose Veins Mother   . Alcohol abuse Father   . Heart disease Father   . Learning disabilities Father   . Bipolar disorder Sister   . Learning disabilities Sister   . Diabetes Paternal Aunt   . Bipolar disorder Maternal Grandmother   . Hypertension Maternal Grandfather   . Cancer Paternal Grandmother   . Cancer Paternal Grandfather     Social History:  Social History   Socioeconomic History  . Marital status: Single    Spouse name: Not on file  . Number of children: 0  . Years of education: Not on file  . Highest education level: Bachelor's degree (e.g., BA, AB, BS)  Occupational History  . Not on file  Social Needs  . Financial resource strain: Somewhat hard  . Food insecurity:    Worry: Never true    Inability: Never true  . Transportation needs:    Medical: No    Non-medical: No  Tobacco Use  . Smoking status: Former Games developermoker  . Smokeless tobacco: Never Used  Substance and Sexual Activity  . Alcohol use: No  . Drug use: No  .  Sexual activity: Not Currently    Birth control/protection: I.U.D.  Lifestyle  . Physical activity:    Days per week: 0 days    Minutes per session: 0 min  . Stress: To some extent  Relationships  . Social connections:    Talks on phone: More than three times a week    Gets together: More than three times a week    Attends religious service: Never    Active member of club or organization: No    Attends meetings of clubs or organizations: Never    Relationship status: Never married  Other Topics Concern  . Not on file  Social History Narrative  . Not on file    Allergies: No Known Allergies  Metabolic Disorder Labs: No  results found for: HGBA1C, MPG No results found for: PROLACTIN Lab Results  Component Value Date   CHOL 238 (H) 05/18/2018   TRIG 109 05/18/2018   HDL 40 05/18/2018   CHOLHDL 6.0 (H) 05/18/2018   LDLCALC 176 (H) 05/18/2018   LDLCALC 134 (H) 11/19/2017   Lab Results  Component Value Date   TSH 1.520 11/19/2017    Therapeutic Level Labs: No results found for: LITHIUM No results found for: VALPROATE No components found for:  CBMZ  Current Medications: Current Outpatient Medications  Medication Sig Dispense Refill  . lamoTRIgine (LAMICTAL) 25 MG tablet Take 3 tablets (75 mg total) by mouth daily. 90 tablet 0  . meloxicam (MOBIC) 15 MG tablet TAKE 1 TABLET BY MOUTH ONCE DAILY 30 tablet 1  . phentermine (ADIPEX-P) 37.5 MG tablet Take 1 tablet (37.5 mg total) by mouth daily before breakfast. 30 tablet 3  . citalopram (CELEXA) 40 MG tablet Take 1 tablet (40 mg total) by mouth daily. 90 tablet 2  . traZODone (DESYREL) 100 MG tablet Take 1 tablet (100 mg total) by mouth at bedtime. 90 tablet 1   No current facility-administered medications for this visit.      Musculoskeletal: Strength & Muscle Tone: within normal limits Gait & Station: normal Patient leans: N/A  Psychiatric Specialty Exam: ROS  Blood pressure 137/74, pulse 90, height 5\' 10"  (1.778 m), weight 251 lb (113.9 kg), SpO2 100 %.Body mass index is 36.01 kg/m.  General Appearance: Casual  Eye Contact:  Good  Speech:  Pressured and fast  Volume:  Increased  Mood:  Irritable  Affect:  Labile  Thought Process:  Descriptions of Associations: Circumstantial  Orientation:  Full (Time, Place, and Person)  Thought Content: Rumination   Suicidal Thoughts:  No  Homicidal Thoughts:  No  Memory:  Immediate;   Fair Recent;   Fair Remote;   Good  Judgement:  Good  Insight:  Good  Psychomotor Activity:  Increased  Concentration:  Concentration: Fair and Attention Span: Fair  Recall:  Good  Fund of Knowledge: Good   Language: Good  Akathisia:  No  Handed:  Right  AIMS (if indicated): not done  Assets:  Communication Skills Desire for Improvement Housing Resilience Social Support  ADL's:  Intact  Cognition: WNL  Sleep:  Good   Screenings: GAD-7     [facility]  Total GAD-7 Score  0  0  0  5  2    PHQ2-9     Office Visit from 08/16/2018 in [facility] Office Visit from 05/18/2018 in [facility] Office Visit from 03/14/2018 in [facility] Office Visit from 01/12/2018 in [facility] Office Visit from 12/15/2017 in [facility]  PHQ-2 Total Score  0  0  0  0  0  PHQ-9 Total Score  0  0  0  5  0       Assessment and Plan: Bipolar disorder type II.  Anxiety.  History of ADHD.  Patient is no longer taking Latuda since dose increase and she started to feel worsening of the symptoms.  She like Lamictal.  She still have residual symptoms.  Recommended to try Lamictal 100 mg daily and reinforced to stop the medication if she develop a rash.  She is getting Celexa 40 mg and trazodone 100 mg from her primary care physician.  She is not interested in therapy.  Recommended to call us back if she is any question or any concern.  Follow-up in 3 months.   Cleotis Nipper, MD 10/19/2018, 10:21 AM

## 2018-10-22 ENCOUNTER — Encounter: Payer: Self-pay | Admitting: Nurse Practitioner

## 2018-11-22 ENCOUNTER — Ambulatory Visit: Payer: Medicaid Other | Attending: Nurse Practitioner | Admitting: Nurse Practitioner

## 2018-11-22 ENCOUNTER — Encounter: Payer: Self-pay | Admitting: Nurse Practitioner

## 2018-11-22 VITALS — BP 128/79 | HR 89 | Temp 98.2°F | Ht 70.0 in | Wt 240.6 lb

## 2018-11-22 DIAGNOSIS — Z6830 Body mass index (BMI) 30.0-30.9, adult: Secondary | ICD-10-CM | POA: Insufficient documentation

## 2018-11-22 DIAGNOSIS — Z8249 Family history of ischemic heart disease and other diseases of the circulatory system: Secondary | ICD-10-CM | POA: Diagnosis not present

## 2018-11-22 DIAGNOSIS — F319 Bipolar disorder, unspecified: Secondary | ICD-10-CM | POA: Insufficient documentation

## 2018-11-22 DIAGNOSIS — Z791 Long term (current) use of non-steroidal anti-inflammatories (NSAID): Secondary | ICD-10-CM | POA: Insufficient documentation

## 2018-11-22 DIAGNOSIS — Z713 Dietary counseling and surveillance: Secondary | ICD-10-CM | POA: Diagnosis not present

## 2018-11-22 DIAGNOSIS — E669 Obesity, unspecified: Secondary | ICD-10-CM | POA: Diagnosis not present

## 2018-11-22 DIAGNOSIS — Z6834 Body mass index (BMI) 34.0-34.9, adult: Secondary | ICD-10-CM

## 2018-11-22 DIAGNOSIS — Z7689 Persons encountering health services in other specified circumstances: Secondary | ICD-10-CM | POA: Diagnosis not present

## 2018-11-22 DIAGNOSIS — Z79899 Other long term (current) drug therapy: Secondary | ICD-10-CM | POA: Diagnosis not present

## 2018-11-22 DIAGNOSIS — Z23 Encounter for immunization: Secondary | ICD-10-CM | POA: Diagnosis not present

## 2018-11-22 DIAGNOSIS — Z0001 Encounter for general adult medical examination with abnormal findings: Secondary | ICD-10-CM | POA: Diagnosis not present

## 2018-11-22 DIAGNOSIS — Z0000 Encounter for general adult medical examination without abnormal findings: Secondary | ICD-10-CM | POA: Diagnosis not present

## 2018-11-22 DIAGNOSIS — Z818 Family history of other mental and behavioral disorders: Secondary | ICD-10-CM | POA: Diagnosis not present

## 2018-11-22 DIAGNOSIS — I10 Essential (primary) hypertension: Secondary | ICD-10-CM | POA: Diagnosis not present

## 2018-11-22 DIAGNOSIS — F419 Anxiety disorder, unspecified: Secondary | ICD-10-CM | POA: Insufficient documentation

## 2018-11-22 MED ORDER — PHENTERMINE HCL 37.5 MG PO TABS: 37.5000 mg | ORAL_TABLET | Freq: Every day | ORAL | 3 refills | Status: DC

## 2018-11-22 NOTE — Progress Notes (Signed)
Assessment & Plan:  Sarah Salas was seen today for annual exam.  Diagnoses and all orders for this visit:  Well woman exam without gynecological exam -     CBC -     CMP14+EGFR  Encounter for weight management -     CBC  Immunization due -     Tdap vaccine greater than or equal to 41yo IM  Obesity (BMI 30.0-34.9) -     phentermine (ADIPEX-P) 37.5 MG tablet; Take 1 tablet (37.5 mg total) by mouth daily before breakfast. -     Lipid panel Discussed diet and exercise for person with BMI >34. Instructed: You must burn more calories than you eat. Losing 5 percent of your body weight should be considered a success. In the longer term, losing more than 15 percent of your body weight and staying at this weight is an extremely good result. However, keep in mind that even losing 5 percent of your body weight leads to important health benefits, so try not to get discouraged if you're not able to lose more than this. Will recheck weight in 3-6 months.  Patient has been counseled on age-appropriate routine health concerns for screening and prevention. These are reviewed and up-to-date. Referrals have been placed accordingly. Immunizations are up-to-date or declined.    Subjective:   Chief Complaint  Patient presents with  . Annual Exam    Pt. is here for a physical.    HPI Sarah Salas 43 y.o. female presents to office today for well woman exam.  She is not due for Pap today.  She has no concerns to discuss today.  Review of Systems  Constitutional: Negative.  Negative for chills, fever, malaise/fatigue and weight loss.  HENT: Negative.  Negative for congestion, hearing loss, sinus pain and sore throat.   Eyes: Negative.  Negative for blurred vision, double vision, photophobia and pain.  Respiratory: Negative.  Negative for cough, sputum production, shortness of breath and wheezing.   Cardiovascular: Negative.  Negative for chest pain and leg swelling.  Gastrointestinal: Negative.  Negative  for abdominal pain, constipation, diarrhea, heartburn, nausea and vomiting.  Genitourinary: Negative.   Musculoskeletal: Negative.  Negative for joint pain and myalgias.  Skin: Negative.  Negative for rash.  Neurological: Negative.  Negative for dizziness, tremors, speech change, focal weakness, seizures and headaches.  Endo/Heme/Allergies: Negative.  Negative for environmental allergies.  Psychiatric/Behavioral: Negative for depression and suicidal ideas. The patient is nervous/anxious. The patient does not have insomnia.        History of bipolar disorder.  She sees psychiatry regularly.    Past Medical History:  Diagnosis Date  . Anxiety   . Bipolar 1 disorder ([place])   . Brain injury ([place])   . Hypertension     Past Surgical History:  Procedure Laterality Date  . BRAIN SURGERY    . FRACTURE SURGERY      Family History  Problem Relation Age of Onset  . Bipolar disorder Mother   . Hypertension Mother   . Miscarriages / [place] Mother   . Vision loss Mother   . Varicose Veins Mother   . Alcohol abuse Father   . Heart disease Father   . Learning disabilities Father   . Bipolar disorder Sister   . Learning disabilities Sister   . Diabetes Paternal Aunt   . Bipolar disorder Maternal Grandmother   . Hypertension Maternal Grandfather   . Cancer Paternal Grandmother   . Cancer Paternal Grandfather     Social History  Reviewed with no changes to be made today.   Outpatient Medications Prior to Visit  Medication Sig Dispense Refill  . lamoTRIgine (LAMICTAL) 100 MG tablet Take 1 tablet (100 mg total) by mouth daily. 90 tablet 0  . meloxicam (MOBIC) 15 MG tablet TAKE 1 TABLET BY MOUTH ONCE DAILY 30 tablet 1  . phentermine (ADIPEX-P) 37.5 MG tablet Take 1 tablet (37.5 mg total) by mouth daily before breakfast. 30 tablet 3  . citalopram (CELEXA) 40 MG tablet Take 1 tablet (40 mg total) by mouth daily. 90 tablet 2  . traZODone (DESYREL) 100 MG tablet Take 1 tablet (100 mg  total) by mouth at bedtime. 90 tablet 1   No facility-administered medications prior to visit.     No Known Allergies     Objective:    BP 128/79 (BP Location: Right Arm, Patient Position: Sitting, Cuff Size: Normal)   Pulse 89   Temp 98.2 F (36.8 C) (Oral)   Ht 5' 10"  (1.778 m)   Wt 240 lb 9.6 oz (109.1 kg)   SpO2 98%   BMI 34.52 kg/m  Wt Readings from Last 3 Encounters:  11/22/18 240 lb 9.6 oz (109.1 kg)  08/16/18 252 lb 9.6 oz (114.6 kg)  06/09/18 261 lb (118.4 kg)    Physical Exam Constitutional:      Appearance: She is well-developed.  HENT:     Head: Normocephalic and atraumatic.     Right Ear: External ear normal.     Left Ear: External ear normal.     Nose: Nose normal.     Mouth/Throat:     Pharynx: No oropharyngeal exudate.  Eyes:     General: No scleral icterus.       Right eye: No discharge.     Conjunctiva/sclera: Conjunctivae normal.     Pupils: Pupils are equal, round, and reactive to light.  Neck:     Musculoskeletal: Normal range of motion and neck supple.     Thyroid: No thyromegaly.     Trachea: No tracheal deviation.  Cardiovascular:     Rate and Rhythm: Normal rate and regular rhythm.     Pulses:          Dorsalis pedis pulses are 2+ on the right side and 2+ on the left side.       Posterior tibial pulses are 2+ on the right side and 2+ on the left side.     Heart sounds: Normal heart sounds. No murmur. No friction rub.  Pulmonary:     Effort: Pulmonary effort is normal. No accessory muscle usage or respiratory distress.     Breath sounds: Normal breath sounds. No decreased breath sounds, wheezing, rhonchi or rales.  Chest:     Chest wall: No tenderness.     Breasts: Breasts are symmetrical.        Right: No swelling, bleeding, inverted nipple (Flat), mass, nipple discharge, skin change or tenderness.        Left: No swelling, bleeding, inverted nipple (Flat), mass, nipple discharge, skin change or tenderness.  Abdominal:     General:  Bowel sounds are normal. There is no distension.     Palpations: Abdomen is soft. There is no mass.     Tenderness: There is no abdominal tenderness. There is no guarding or rebound.  Musculoskeletal: Normal range of motion.        General: No tenderness or deformity.       Legs:  Feet:     Right foot:  Protective Sensation: 10 sites tested. 10 sites sensed.     Skin integrity: Skin integrity normal.     Left foot:     Protective Sensation: 10 sites tested. 10 sites sensed.     Skin integrity: Skin integrity normal.  Lymphadenopathy:     Cervical: No cervical adenopathy.     Upper Body:     Right upper body: No supraclavicular, axillary or pectoral adenopathy.     Left upper body: No supraclavicular, axillary or pectoral adenopathy.  Skin:    General: Skin is warm and dry.     Findings: No erythema.     Comments: Numerous flat nevi appearing lesions throughout back  Neurological:     Mental Status: She is alert and oriented to person, place, and time.     Cranial Nerves: No cranial nerve deficit.     Coordination: Coordination normal.     Deep Tendon Reflexes: Reflexes are normal and symmetric.  Psychiatric:        Speech: Speech normal.        Behavior: Behavior normal.        Thought Content: Thought content normal.        Judgment: Judgment normal.        Patient has been counseled extensively about nutrition and exercise as well as the importance of adherence with medications and regular follow-up. The patient was given clear instructions to go to ER or return to medical center if symptoms don't improve, worsen or new problems develop. The patient verbalized understanding.   Follow-up: Return if symptoms worsen or fail to improve.   Gildardo Pounds, FNP-BC [facility], [place]   11/22/2018, 6:56 PM

## 2018-11-24 LAB — LIPID PANEL
Chol/HDL Ratio: 4 ratio (ref 0.0–4.4)
Cholesterol, Total: 185 mg/dL (ref 100–199)
HDL: 46 mg/dL (ref >39)
LDL Calculated: 116 mg/dL — ABNORMAL HIGH (ref 0–99)
TRIGLYCERIDES: 115 mg/dL (ref 0–149)
VLDL Cholesterol Cal: 23 mg/dL (ref 5–40)

## 2018-11-24 LAB — CMP14+EGFR
ALK PHOS: 64 IU/L (ref 39–117)
ALT: 23 IU/L (ref 0–32)
AST: 22 IU/L (ref 0–40)
Albumin/Globulin Ratio: 2.8 — ABNORMAL HIGH (ref 1.2–2.2)
Albumin: 4.2 g/dL (ref 3.8–4.8)
BUN/Creatinine Ratio: 14 (ref 9–23)
BUN: 11 mg/dL (ref 6–24)
Bilirubin Total: 0.5 mg/dL (ref 0.0–1.2)
CO2: 30 mmol/L — ABNORMAL HIGH (ref 20–29)
Calcium: 9.1 mg/dL (ref 8.7–10.2)
Chloride: 98 mmol/L (ref 96–106)
Creatinine, Ser: 0.78 mg/dL (ref 0.57–1.00)
GFR calc Af Amer: 110 mL/min/{1.73_m2} (ref >59)
GFR calc non Af Amer: 95 mL/min/{1.73_m2} (ref >59)
Globulin, Total: 1.5 g/dL (ref 1.5–4.5)
Glucose: 80 mg/dL (ref 65–99)
POTASSIUM: 3.5 mmol/L (ref 3.5–5.2)
Sodium: 142 mmol/L (ref 134–144)
Total Protein: 5.7 g/dL — ABNORMAL LOW (ref 6.0–8.5)

## 2018-11-24 LAB — CBC
Hematocrit: 39.9 % (ref 34.0–46.6)
Hemoglobin: 13.5 g/dL (ref 11.1–15.9)
MCH: 29.7 pg (ref 26.6–33.0)
MCHC: 33.8 g/dL (ref 31.5–35.7)
MCV: 88 fL (ref 79–97)
Platelets: 304 10*3/uL (ref 150–450)
RBC: 4.54 x10E6/uL (ref 3.77–5.28)
RDW: 13 % (ref 11.7–15.4)
WBC: 5.7 10*3/uL (ref 3.4–10.8)

## 2018-11-27 ENCOUNTER — Encounter: Payer: Self-pay | Admitting: Nurse Practitioner

## 2018-12-07 ENCOUNTER — Ambulatory Visit: Payer: Self-pay | Admitting: Pharmacist

## 2018-12-07 ENCOUNTER — Ambulatory Visit: Payer: Medicaid Other | Attending: Nurse Practitioner | Admitting: Emergency Medicine

## 2018-12-07 DIAGNOSIS — H6123 Impacted cerumen, bilateral: Secondary | ICD-10-CM

## 2018-12-07 DIAGNOSIS — Z23 Encounter for immunization: Secondary | ICD-10-CM

## 2018-12-07 NOTE — Progress Notes (Signed)
Ceruminosis is noted.  Wax is removed by elephant ear device. Instructions for home care to prevent wax buildup are given.  

## 2018-12-12 ENCOUNTER — Encounter: Payer: Self-pay | Admitting: Nurse Practitioner

## 2018-12-12 ENCOUNTER — Other Ambulatory Visit: Payer: Self-pay | Admitting: Nurse Practitioner

## 2018-12-24 ENCOUNTER — Encounter: Payer: Self-pay | Admitting: Nurse Practitioner

## 2019-01-01 ENCOUNTER — Encounter: Payer: Self-pay | Admitting: Nurse Practitioner

## 2019-01-04 ENCOUNTER — Ambulatory Visit: Payer: Self-pay | Admitting: Nurse Practitioner

## 2019-01-11 ENCOUNTER — Other Ambulatory Visit (HOSPITAL_COMMUNITY): Payer: Self-pay | Admitting: Psychiatry

## 2019-01-11 DIAGNOSIS — F419 Anxiety disorder, unspecified: Secondary | ICD-10-CM

## 2019-01-11 DIAGNOSIS — F319 Bipolar disorder, unspecified: Secondary | ICD-10-CM

## 2019-01-18 ENCOUNTER — Other Ambulatory Visit: Payer: Self-pay

## 2019-01-18 ENCOUNTER — Ambulatory Visit (INDEPENDENT_AMBULATORY_CARE_PROVIDER_SITE_OTHER): Payer: Medicaid Other | Admitting: Psychiatry

## 2019-01-18 DIAGNOSIS — F419 Anxiety disorder, unspecified: Secondary | ICD-10-CM

## 2019-01-18 DIAGNOSIS — F319 Bipolar disorder, unspecified: Secondary | ICD-10-CM | POA: Diagnosis not present

## 2019-01-18 MED ORDER — CITALOPRAM HYDROBROMIDE 20 MG PO TABS: 20.0000 mg | ORAL_TABLET | Freq: Every day | ORAL | 0 refills | Status: DC

## 2019-01-18 MED ORDER — CLONAZEPAM 0.5 MG PO TABS: 0.5000 mg | ORAL_TABLET | Freq: Every day | ORAL | 0 refills | Status: DC | PRN

## 2019-01-18 MED ORDER — LAMOTRIGINE 150 MG PO TABS: 150.0000 mg | ORAL_TABLET | Freq: Every day | ORAL | 0 refills | Status: DC

## 2019-01-18 NOTE — Progress Notes (Signed)
Virtual Visit via Telephone Note  I connected with Sarah Salas on 01/18/19 at 10:00 AM EDT by telephone and verified that I am speaking with the correct person using two identifiers.   I discussed the limitations, risks, security and privacy concerns of performing an evaluation and management service by telephone and the availability of in person appointments. I also discussed with the patient that there may be a patient responsible charge related to this service. The patient expressed understanding and agreed to proceed.   History of Present Illness: Patient was evaluated through phone session.  Patient requested her friend to be involved in the session who is staying with her.  Patient told her friend is like her sister and she is very close to her.  I spoke with the patient's friend and the patient together on the speaker phone.  Apparently patient is not doing very well for past 2 months.  She was prescribed phentermine by primary care physician for weight loss and since then her friend noticed that she is more anxious, irritable, difficulty to organize her thinking and having poor sleep and anxiety attacks.  She gets easily overwhelmed.  She is also taking care of her 27 year old niece who requires full-time daycare.  She is taking Lamictal which was increased on her last visit that helped some of her mood but her friend noticed since taking the phentermine her symptoms started to get worse.  Patient denies any hallucination, paranoia, suicidal thoughts.  She admitted highs and lows and increased anxiety.  She does not leave the house.  She is afraid that she will get infected with pandemic coronavirus and she has been her daughter.  She is worried about her knees that no one will take care of her if she died.  She admitted some time paranoia and extreme anxiety.  However she denies any suicidal thoughts or homicidal thought.  She do not reported any tremors, shakes or any EPS.  Her friend is a  staying with her for the safety and keeping a close eye.  She denies drinking or using any illegal substances.  Past Psychiatric History: Reviewed. H/O ADHD and took Adderall in college. Diagnosed bipolar disorder and tried doxepin, Vistaril with poor outcome.  Latuda help in the beginning but when dose increase symptoms got worse.  No h/o paranoia, psychiatric inpatient treatment or any suicidal attempt.     Recent Results (from the past 2160 hour(s))  CBC     Status: None   Collection Time: 11/22/18 11:32 AM  Result Value Ref Range   WBC 5.7 3.4 - 10.8 x10E3/uL   RBC 4.54 3.77 - 5.28 x10E6/uL   Hemoglobin 13.5 11.1 - 15.9 g/dL   Hematocrit 39.9 34.0 - 46.6 %   MCV 88 79 - 97 fL   MCH 29.7 26.6 - 33.0 pg   MCHC 33.8 31.5 - 35.7 g/dL   RDW 13.0 11.7 - 15.4 %   Platelets 304 150 - 450 x10E3/uL  CMP14+EGFR     Status: Abnormal   Collection Time: 11/22/18 11:32 AM  Result Value Ref Range   Glucose 80 65 - 99 mg/dL   BUN 11 6 - 24 mg/dL   Creatinine, Ser 0.78 0.57 - 1.00 mg/dL   GFR calc non Af Amer 95 >59 mL/min/1.73   GFR calc Af Amer 110 >59 mL/min/1.73   BUN/Creatinine Ratio 14 9 - 23   Sodium 142 134 - 144 mmol/L   Potassium 3.5 3.5 - 5.2 mmol/L   Chloride 98  96 - 106 mmol/L   CO2 30 (H) 20 - 29 mmol/L   Calcium 9.1 8.7 - 10.2 mg/dL   Total Protein 5.7 (L) 6.0 - 8.5 g/dL   Albumin 4.2 3.8 - 4.8 g/dL    Comment:               **Please note reference interval change**   Globulin, Total 1.5 1.5 - 4.5 g/dL   Albumin/Globulin Ratio 2.8 (H) 1.2 - 2.2   Bilirubin Total 0.5 0.0 - 1.2 mg/dL   Alkaline Phosphatase 64 39 - 117 IU/L   AST 22 0 - 40 IU/L   ALT 23 0 - 32 IU/L  Lipid panel     Status: Abnormal   Collection Time: 11/22/18 11:32 AM  Result Value Ref Range   Cholesterol, Total 185 100 - 199 mg/dL   Triglycerides 115 0 - 149 mg/dL   HDL 46 >39 mg/dL   VLDL Cholesterol Cal 23 5 - 40 mg/dL   LDL Calculated 116 (H) 0 - 99 mg/dL   Chol/HDL Ratio 4.0 0.0 - 4.4 ratio     Comment:                                   T. Chol/HDL Ratio                                             Men  Women                               1/2 Avg.Risk  3.4    3.3                                   Avg.Risk  5.0    4.4                                2X Avg.Risk  9.6    7.1                                3X Avg.Risk 23.4   11.0      Observations/Objective: Limited mental status examination done on the phone.  Patient appears easily distracted and needed help her friend to express her symptoms.  Her speech is fast and rambling and at times coherent.  Thought process is circumstantial.  She reported ruminative thinking about current situation and appears to be very anxious.  However there were no delusions but admitted some paranoia.  She denies any active or passive suicidal thoughts or homicidal thought.  Her attention and concentration is fair.  She is alert and oriented x3.  She is able to tell me about the medication name and dosage.  Her fund of knowledge is fair.  Her cognition is fair.  Her insight judgment is okay.  Assessment and Plan: Bipolar disorder type II.  Anxiety.  History of ADD.  Patient is experiencing increased anxiety and manic-like symptoms which could be due to phentermine.  I recommend to stop the phentermine as soon as possible which was given by  her primary care physician for weight loss.  I also reviewed her recent blood work results.  She has high LDL.  I recommend to try Lamictal 150 mg to help her mood irritability however she need to stop the medication if you develop a rash.  I also recommend to reduce Celexa to 20 mg to help her manic symptoms.  Continue trazodone to help her sleep.  Patient is not interested in therapy.  I recommend to call us back if she has any question, concern or if she feels worsening of the symptom.  Discussed safety concern that anytime having active suicidal thoughts or homicidal thoughts then she need to call 911 or go to local  emergency room.  I will also forward my note to her primary care physician.  Follow-up in 4 weeks.  Follow Up Instructions:    I discussed the assessment and treatment plan with the patient. The patient was provided an opportunity to ask questions and all were answered. The patient agreed with the plan and demonstrated an understanding of the instructions.   The patient was advised to call back or seek an in-person evaluation if the symptoms worsen or if the condition fails to improve as anticipated.  I provided 30 minutes of non-face-to-face time during this encounter.   Kathlee Nations, MD

## 2019-01-27 ENCOUNTER — Other Ambulatory Visit: Payer: Self-pay | Admitting: Nurse Practitioner

## 2019-01-27 DIAGNOSIS — G4709 Other insomnia: Secondary | ICD-10-CM

## 2019-02-01 ENCOUNTER — Telehealth (HOSPITAL_COMMUNITY): Payer: Self-pay

## 2019-02-01 NOTE — Telephone Encounter (Signed)
Patient called regarding her increase in the Lamictal. She stated it is not working. Please review and advise. Thank you.

## 2019-02-02 ENCOUNTER — Other Ambulatory Visit (HOSPITAL_COMMUNITY): Payer: Self-pay

## 2019-02-02 MED ORDER — ARIPIPRAZOLE 5 MG PO TABS: ORAL_TABLET | ORAL | 0 refills | Status: DC

## 2019-02-02 NOTE — Telephone Encounter (Signed)
I tried to call her but number was blocked.  She is supposed to stop phentermine and reduce Celexa.  If Lamictal 150 not helping then she can add Abilify 2 to 5 mg daily.  Will defer increasing more Lamictal at this time.  Recommend to call us back if symptoms do not improve.

## 2019-02-02 NOTE — Progress Notes (Signed)
Spoke with Dr. Lolly Mustache about patient still being manic and he added Abilify. I called patient and she agreed with this plan, prescription was sent to the pharmacy

## 2019-02-06 NOTE — Telephone Encounter (Signed)
Spoke with patient, she is not taking Phentermine and she has reduced the Celexa. I sent rx for Abilify to the pharmacy and informed patient, she is agreeable to this plan.

## 2019-02-15 ENCOUNTER — Ambulatory Visit (INDEPENDENT_AMBULATORY_CARE_PROVIDER_SITE_OTHER): Payer: Medicaid Other | Admitting: Psychiatry

## 2019-02-15 ENCOUNTER — Other Ambulatory Visit: Payer: Self-pay

## 2019-02-15 ENCOUNTER — Encounter (HOSPITAL_COMMUNITY): Payer: Self-pay | Admitting: Psychiatry

## 2019-02-15 DIAGNOSIS — F319 Bipolar disorder, unspecified: Secondary | ICD-10-CM

## 2019-02-15 DIAGNOSIS — F419 Anxiety disorder, unspecified: Secondary | ICD-10-CM

## 2019-02-15 MED ORDER — TRAZODONE HCL 150 MG PO TABS: 150.0000 mg | ORAL_TABLET | Freq: Every day | ORAL | 0 refills | Status: DC

## 2019-02-15 MED ORDER — CLONAZEPAM 0.5 MG PO TABS: 0.5000 mg | ORAL_TABLET | Freq: Two times a day (BID) | ORAL | 0 refills | Status: DC

## 2019-02-15 NOTE — Progress Notes (Signed)
Virtual Visit via Telephone Note  I connected with Sarah Salas on 02/15/19 at  9:40 AM EDT by telephone and verified that I am speaking with the correct person using two identifiers.   I discussed the limitations, risks, security and privacy concerns of performing an evaluation and management service by telephone and the availability of in person appointments. I also discussed with the patient that there may be a patient responsible charge related to this service. The patient expressed understanding and agreed to proceed.   History of Present Illness: Patient was evaluated through phone session.  She recently called because worsening of symptoms and we recommend to try Abilify.  First few days she took half tablet now she is taking 5 mg.  She feels that it is helping but she still have a lot of anxiety.  She gets easily overwhelmed.  She is sleeping only few hours and when she gets sleeps very late and in the morning she gets groggy.  She has strenuous relationship with her younger sister as she is keeping her 41 year old niece.  Patient denies any suicidal thoughts or homicidal thought but still has episodes of mania and difficulty to organize her thinking.  She had a good support from her mother and her best friend.  She is tolerating medicine and reported no side effects.  She denies drinking or using any illegal substances.   Past Psychiatric History:Reviewed. H/O ADHDand took Adderall in college. Diagnosed bipolar disorder and tried doxepin, Vistaril with poor outcome. Latuda help in the beginning but when dose increase symptoms got worse. No h/o paranoia, psychiatric inpatient treatment or any suicidal attempt.    Observations/Objective: Mental status examination done on the phone.  Her speech is fast but coherent.  Her thought process circumstantial.  She describes her mood anxious.  She admitted highs and lows but denies any auditory or visual hallucination.  She denies any active or  passive suicidal thoughts or homicidal thought.  Her attention and concentration gets distracted.  There were no paranoia or delusion.  She is alert and oriented x3.  Her cognition is intact.  Her fund of knowledge is adequate.  Her insight and judgment is okay.  Assessment and Plan: Bipolar disorder type II.  Anxiety.  History of ADD.  Patient doing little better since addition of Abilify however she is still very anxious and getting sleep only few hours.  She reported no tremors, shakes, rash or any itching.  She is tolerating her medication and does not have any issues with the Lamictal.  Recommend to continue Lamictal 150 mg daily, continue Abilify 5 mg daily, Celexa 20 mg daily.  I will increase trazodone 250 mg to help insomnia and clonazepam 0.5 mg to take twice a day to help with anxiety.  Discussed medication side effects and benefits.  She is no longer taking phentermine.  Recommended to call us back if she has any question or any concern.  She is not interested in therapy.  Follow-up in 6 weeks.  Follow Up Instructions:    I discussed the assessment and treatment plan with the patient. The patient was provided an opportunity to ask questions and all were answered. The patient agreed with the plan and demonstrated an understanding of the instructions.   The patient was advised to call back or seek an in-person evaluation if the symptoms worsen or if the condition fails to improve as anticipated.  I provided 20 minutes of non-face-to-face time during this encounter.   Cleotis Nipper,  MD   

## 2019-02-25 ENCOUNTER — Encounter: Payer: Self-pay | Admitting: Nurse Practitioner

## 2019-03-09 DIAGNOSIS — H401131 Primary open-angle glaucoma, bilateral, mild stage: Secondary | ICD-10-CM | POA: Diagnosis not present

## 2019-03-25 ENCOUNTER — Other Ambulatory Visit (HOSPITAL_COMMUNITY): Payer: Self-pay | Admitting: Psychiatry

## 2019-03-25 DIAGNOSIS — F419 Anxiety disorder, unspecified: Secondary | ICD-10-CM

## 2019-03-29 ENCOUNTER — Encounter (HOSPITAL_COMMUNITY): Payer: Self-pay | Admitting: Psychiatry

## 2019-03-29 ENCOUNTER — Ambulatory Visit (INDEPENDENT_AMBULATORY_CARE_PROVIDER_SITE_OTHER): Payer: Medicaid Other | Admitting: Psychiatry

## 2019-03-29 ENCOUNTER — Other Ambulatory Visit: Payer: Self-pay

## 2019-03-29 DIAGNOSIS — F419 Anxiety disorder, unspecified: Secondary | ICD-10-CM | POA: Diagnosis not present

## 2019-03-29 DIAGNOSIS — F319 Bipolar disorder, unspecified: Secondary | ICD-10-CM | POA: Diagnosis not present

## 2019-03-29 DIAGNOSIS — Z8659 Personal history of other mental and behavioral disorders: Secondary | ICD-10-CM | POA: Diagnosis not present

## 2019-03-29 MED ORDER — TRAZODONE HCL 150 MG PO TABS: 150.0000 mg | ORAL_TABLET | Freq: Every day | ORAL | 0 refills | Status: DC

## 2019-03-29 MED ORDER — LAMOTRIGINE 200 MG PO TABS: 200.0000 mg | ORAL_TABLET | Freq: Every day | ORAL | 0 refills | Status: DC

## 2019-03-29 MED ORDER — CITALOPRAM HYDROBROMIDE 20 MG PO TABS: 20.0000 mg | ORAL_TABLET | Freq: Every day | ORAL | 0 refills | Status: DC

## 2019-03-29 MED ORDER — CLONAZEPAM 0.5 MG PO TABS: 0.5000 mg | ORAL_TABLET | Freq: Three times a day (TID) | ORAL | 1 refills | Status: DC | PRN

## 2019-03-29 MED ORDER — ARIPIPRAZOLE 5 MG PO TABS: ORAL_TABLET | ORAL | 0 refills | Status: DC

## 2019-03-29 NOTE — Progress Notes (Signed)
Virtual Visit via Telephone Note  I connected with Sarah Salas on 03/29/19 at  8:40 AM EDT by telephone and verified that I am speaking with the correct person using two identifiers.   I discussed the limitations, risks, security and privacy concerns of performing an evaluation and management service by telephone and the availability of in person appointments. I also discussed with the patient that there may be a patient responsible charge related to this service. The patient expressed understanding and agreed to proceed.   History of Present Illness: Patient was evaluated by phone session.  On her last visit we started Klonopin and increase Lamictal.  We also increase trazodone.  She is feeling better.  She is not as irritable and anxious.  However she still struggle with sleep and sometimes wake up at 3 in the morning and does not go back to sleep.  She still have residual symptoms of mood swing, highs and lows and gets easily distracted.  She reported no tremors, rash, itching or any shakes.  She is keeping her 66 year old niece and things are going well except sometimes she has to deal with her younger sister.  She had a good support from her mother and stepfather.  She denies any hallucination or any suicidal thoughts.  She denies any paranoia but endorsed anxiety, nervousness and mood swings.  She is not reported any aggression or violence.  She denies drinking or using any illegal substances.  Her appetite is okay.  She reported her weight is a stable.  Past Psychiatric History:Reviewed. H/OADHDand took Adderall in college. Diagnosed bipolar disorder and tried doxepin, Vistaril with poor outcome. Latuda help in the beginning but when dose increase symptoms got worse. No h/oparanoia, psychiatric inpatient treatment or any suicidal attempt.    Psychiatric Specialty Exam: Physical Exam  ROS  There were no vitals taken for this visit.There is no height or weight on file to calculate  BMI.  General Appearance: NA  Eye Contact:  NA  Speech:  Clear and Coherent and Pressured  Volume:  Normal  Mood:  Anxious  Affect:  Labile  Thought Process:  Descriptions of Associations: Circumstantial  Orientation:  Full (Time, Place, and Person)  Thought Content:  Rumination  Suicidal Thoughts:  No  Homicidal Thoughts:  No  Memory:  Immediate;   Fair Recent;   Good Remote;   Good  Judgement:  Fair  Insight:  Good  Psychomotor Activity:  Increased  Concentration:  Concentration: Fair and Attention Span: Fair  Recall:  Good  Fund of Knowledge:  Good  Language:  Good  Akathisia:  No  Handed:  Right  AIMS (if indicated):     Assets:  Communication Skills Desire for Improvement Housing Resilience Social Support  ADL's:  Intact  Cognition:  WNL  Sleep:   improved      Assessment and Plan: Bipolar disorder type II.  Anxiety.  History of ADD.  Patient doing better since the adjustment of the dose on the last visit but is still have residual symptoms of mood lability and anxiety.  Recommended to try Lamictal 200 mg daily to help her mood lability, continue Abilify 5 mg daily, continue trazodone 150 mg at bedtime, continue Celexa 20 mg daily and we will increase Klonopin 0.5 mg 3 times a day to help her anxiety.  Discussed medication side effects and benefits.  She is not interested in therapy.  She has a history of ADD but does not want any medication because it causes worsening  of anxiety and mood swings.  Recommended to call us back if she has any question or any concern.  Follow-up in 2 months.  Follow Up Instructions:    I discussed the assessment and treatment plan with the patient. The patient was provided an opportunity to ask questions and all were answered. The patient agreed with the plan and demonstrated an understanding of the instructions.   The patient was advised to call back or seek an in-person evaluation if the symptoms worsen or if the condition fails to  improve as anticipated.  I provided 20 minutes of non-face-to-face time during this encounter.   Cleotis NipperSyed T Madalynn Pickelsimer, MD

## 2019-04-06 ENCOUNTER — Encounter: Payer: Self-pay | Admitting: Nurse Practitioner

## 2019-04-13 ENCOUNTER — Encounter: Payer: Self-pay | Admitting: Nurse Practitioner

## 2019-05-05 ENCOUNTER — Encounter: Payer: Self-pay | Admitting: Nurse Practitioner

## 2019-05-07 ENCOUNTER — Encounter: Payer: Self-pay | Admitting: Nurse Practitioner

## 2019-05-29 ENCOUNTER — Ambulatory Visit (INDEPENDENT_AMBULATORY_CARE_PROVIDER_SITE_OTHER): Payer: Medicaid Other | Admitting: Psychiatry

## 2019-05-29 ENCOUNTER — Other Ambulatory Visit: Payer: Self-pay

## 2019-05-29 ENCOUNTER — Encounter (HOSPITAL_COMMUNITY): Payer: Self-pay | Admitting: Psychiatry

## 2019-05-29 DIAGNOSIS — F319 Bipolar disorder, unspecified: Secondary | ICD-10-CM

## 2019-05-29 DIAGNOSIS — F419 Anxiety disorder, unspecified: Secondary | ICD-10-CM

## 2019-05-29 MED ORDER — ARIPIPRAZOLE 5 MG PO TABS: ORAL_TABLET | ORAL | 0 refills | Status: DC

## 2019-05-29 MED ORDER — LAMOTRIGINE 200 MG PO TABS: 200.0000 mg | ORAL_TABLET | Freq: Every day | ORAL | 0 refills | Status: DC

## 2019-05-29 MED ORDER — TRAZODONE HCL 150 MG PO TABS: 150.0000 mg | ORAL_TABLET | Freq: Every day | ORAL | 0 refills | Status: DC

## 2019-05-29 MED ORDER — CITALOPRAM HYDROBROMIDE 20 MG PO TABS: 20.0000 mg | ORAL_TABLET | Freq: Every day | ORAL | 0 refills | Status: DC

## 2019-05-29 MED ORDER — CLONAZEPAM 0.5 MG PO TABS: 0.5000 mg | ORAL_TABLET | Freq: Three times a day (TID) | ORAL | 2 refills | Status: DC | PRN

## 2019-05-29 NOTE — Progress Notes (Signed)
Virtual Visit via Telephone Note  I connected with Sarah Salas on 05/29/19 at  9:20 AM EDT by telephone and verified that I am speaking with the correct person using two identifiers.   I discussed the limitations, risks, security and privacy concerns of performing an evaluation and management service by telephone and the availability of in person appointments. I also discussed with the patient that there may be a patient responsible charge related to this service. The patient expressed understanding and agreed to proceed.   History of Present Illness: Patient was seen through phone session.  Since Klonopin increases to .5 mg 3 times a day she is doing much better.  She is sleeping at least 7 to 8 hours.  Her 74 year old niece also doing well and today was her first day at school virtually but statewide there is a Emergency planning/management officer in the computer and she is so far unable to go on Internet.  She denies any crying spells or any mania.  When I ask about her medication compliance.  She admitted to reduce her Celexa 20 mg and continue to take 40 mg.  However she remember first when she took 20 mg but when she refill the prescription she forgot and back on 40 mg.  She admitted sometimes feeling ill manic and hyper overall she reported things are going well.  She is able to help her mother and stepfather.  Patient is taking care of her mother and stepfather.  She also had a good relationship with her younger sister.  She denies any paranoia, hallucination, agitation, anger or any severe mood swings.  She reported no tremors shakes or any EPS.  She denies drinking or using any illegal substances.   Past Psychiatric History:Reviewed. H/OADHDand took Adderall in college. Diagnosed bipolar disorder and tried doxepin, Vistaril with poor outcome. Latuda help in the beginning but get worse with increase dose. No h/o paranoia, psychiatric inpatient treatment or any suicidal attempt.     Psychiatric Specialty  Exam: Physical Exam  ROS  There were no vitals taken for this visit.There is no height or weight on file to calculate BMI.  General Appearance: NA  Eye Contact:  NA  Speech:  Clear and Coherent and fast  Volume:  Normal  Mood:  pleasent  Affect:  NA  Thought Process:  Descriptions of Associations: Intact  Orientation:  Full (Time, Place, and Person)  Thought Content:  Rumination  Suicidal Thoughts:  No  Homicidal Thoughts:  No  Memory:  Immediate;   Fair Recent;   Fair Remote;   Good  Judgement:  Good  Insight:  Good  Psychomotor Activity:  NA  Concentration:  Concentration: Fair and Attention Span: Fair  Recall:  Good  Fund of Knowledge:  Good  Language:  Good  Akathisia:  No  Handed:  Right  AIMS (if indicated):     Assets:  Communication Skills Desire for Improvement Housing Resilience Social Support  ADL's:  Intact  Cognition:  WNL  Sleep:   improved      Assessment and Plan: Bipolar disorder type II.  Anxiety.  History of ADD.  Discussed that she should not taking the Celexa 40 mg as it may cause some manic symptoms.  She agreed to go back on 20 mg daily however if she started to feel depressed then she will call us back.  Patient reported no tremors shakes or any EPS.  Continue Abilify 5 mg daily, trazodone 150 mg at bedtime, Celexa 20 mg daily and Klonopin  0.5 mg 3 times a day.  Discussed medication side effects and benefits specially benzodiazepine dependence tolerance and withdrawal.  She had a history of ADD but does not want to take medication as it caused increased anxiety.  Recommended to call us back if she has any question or any concern.  Follow-up in 3 months.  Follow Up Instructions:    I discussed the assessment and treatment plan with the patient. The patient was provided an opportunity to ask questions and all were answered. The patient agreed with the plan and demonstrated an understanding of the instructions.   The patient was advised to call  back or seek an in-person evaluation if the symptoms worsen or if the condition fails to improve as anticipated.  I provided 20 minutes of non-face-to-face time during this encounter.   Cleotis NipperSyed T Arfeen, MD

## 2019-06-09 ENCOUNTER — Ambulatory Visit: Payer: Medicaid Other | Attending: Nurse Practitioner | Admitting: Pharmacist

## 2019-06-09 ENCOUNTER — Other Ambulatory Visit: Payer: Self-pay

## 2019-06-09 DIAGNOSIS — Z23 Encounter for immunization: Secondary | ICD-10-CM | POA: Diagnosis not present

## 2019-06-09 NOTE — Progress Notes (Signed)
Patient presents for vaccination against influenza per orders of Zelda. Consent given. Counseling provided. No contraindications exists. Vaccine administered without incident.   Will have her reschedule in 2 weeks for Shingrix as, unfortunately, we do not have any Shingrix in clinic.

## 2019-06-23 ENCOUNTER — Ambulatory Visit: Payer: Medicaid Other | Admitting: Pharmacist

## 2019-06-23 ENCOUNTER — Other Ambulatory Visit: Payer: Self-pay

## 2019-07-07 ENCOUNTER — Encounter: Payer: Self-pay | Admitting: Nurse Practitioner

## 2019-08-16 ENCOUNTER — Encounter: Payer: Self-pay | Admitting: Nurse Practitioner

## 2019-08-28 ENCOUNTER — Encounter (HOSPITAL_COMMUNITY): Payer: Self-pay | Admitting: Psychiatry

## 2019-08-28 ENCOUNTER — Ambulatory Visit (INDEPENDENT_AMBULATORY_CARE_PROVIDER_SITE_OTHER): Payer: Medicaid Other | Admitting: Psychiatry

## 2019-08-28 ENCOUNTER — Other Ambulatory Visit: Payer: Self-pay

## 2019-08-28 DIAGNOSIS — F419 Anxiety disorder, unspecified: Secondary | ICD-10-CM

## 2019-08-28 DIAGNOSIS — F319 Bipolar disorder, unspecified: Secondary | ICD-10-CM

## 2019-08-28 MED ORDER — CITALOPRAM HYDROBROMIDE 20 MG PO TABS: 20.0000 mg | ORAL_TABLET | Freq: Every day | ORAL | 0 refills | Status: DC

## 2019-08-28 MED ORDER — LAMOTRIGINE 200 MG PO TABS: 200.0000 mg | ORAL_TABLET | Freq: Every day | ORAL | 0 refills | Status: DC

## 2019-08-28 MED ORDER — TRAZODONE HCL 150 MG PO TABS: 150.0000 mg | ORAL_TABLET | Freq: Every day | ORAL | 0 refills | Status: DC

## 2019-08-28 MED ORDER — CLONAZEPAM 0.5 MG PO TABS: 0.5000 mg | ORAL_TABLET | Freq: Three times a day (TID) | ORAL | 2 refills | Status: DC | PRN

## 2019-08-28 NOTE — Progress Notes (Signed)
Virtual Visit via Telephone Note  I connected with Sarah Salas on 08/28/19 at  9:00 AM EST by telephone and verified that I am speaking with the correct person using two identifiers.   I discussed the limitations, risks, security and privacy concerns of performing an evaluation and management service by telephone and the availability of in person appointments. I also discussed with the patient that there may be a patient responsible charge related to this service. The patient expressed understanding and agreed to proceed.   History of Present Illness: Patient was evaluated through phone session.  She is doing much better since taking her medication as prescribed.  She is now taking Klonopin 0.5 mg 3 times a day and it is helping her anxiety and sleep.  Her family also noticed much improvement in her mood, irritability and mania.  She gets at least 7 to 8 hours sleep and she is happy about it.  Her niece also noticed that she is not getting upset or yelling.  Patient has no tremors, shakes or any EPS.  Patient takes care of her mother and stepfather.  She has a good relationship with her younger sister.  She like to continue her current medication.  She has no rash, itching, tremors or shakes.  She denies drinking or using any illegal substances.  She reported her vitals and weight is a stable as she gave plasma twice a week and they do check weight and vitals on the visits.    Past Psychiatric History:Reviewed. H/OADHDand took Adderall in college. Diagnosed bipolar disorder and tried doxepin, Vistaril with poor outcome. Latuda help in the beginning but get worse with increase dose. No h/o paranoia, psychiatric inpatient treatment or any suicidal attempt.     Psychiatric Specialty Exam: Physical Exam  ROS  There were no vitals taken for this visit.There is no height or weight on file to calculate BMI.  General Appearance: NA  Eye Contact:  NA  Speech:  fast but clear and coherrant   Volume:  Normal  Mood:  Euthymic  Affect:  NA  Thought Process:  Descriptions of Associations: Intact  Orientation:  Full (Time, Place, and Person)  Thought Content:  Rumination  Suicidal Thoughts:  No  Homicidal Thoughts:  No  Memory:  Immediate;   Fair Recent;   Good Remote;   Good  Judgement:  Good  Insight:  Good  Psychomotor Activity:  NA  Concentration:  Concentration: Fair and Attention Span: Fair  Recall:  Good  Fund of Knowledge:  Good  Language:  Good  Akathisia:  No  Handed:  Right  AIMS (if indicated):     Assets:  Communication Skills Desire for Improvement Housing Resilience Social Support  ADL's:  Intact  Cognition:  WNL  Sleep:   7- 8 hrs      Assessment and Plan: Bipolar disorder type II.  Anxiety.  History of ADD.  Patient doing better on her current medication.  She does not want to change the dose.  She has no rash, itching, tremors or shakes.  I will continue Celexa 20 mg daily, trazodone 150 mg at bedtime, Abilify 5 mg daily and Lamictal 200 mg daily.  Discussed medication side effects and benefits.  Recommended to call us back if she has any questions or any concern.  Follow-up in 3 months  Follow Up Instructions:    I discussed the assessment and treatment plan with the patient. The patient was provided an opportunity to ask questions and all were  answered. The patient agreed with the plan and demonstrated an understanding of the instructions.   The patient was advised to call back or seek an in-person evaluation if the symptoms worsen or if the condition fails to improve as anticipated.  I provided 20 minutes of non-face-to-face time during this encounter.   Kathlee Nations, MD

## 2019-09-24 ENCOUNTER — Encounter: Payer: Self-pay | Admitting: Nurse Practitioner

## 2019-10-25 ENCOUNTER — Other Ambulatory Visit (HOSPITAL_COMMUNITY): Payer: Self-pay | Admitting: Psychiatry

## 2019-10-25 DIAGNOSIS — F319 Bipolar disorder, unspecified: Secondary | ICD-10-CM

## 2019-10-30 ENCOUNTER — Other Ambulatory Visit (HOSPITAL_COMMUNITY): Payer: Self-pay | Admitting: *Deleted

## 2019-10-30 DIAGNOSIS — F319 Bipolar disorder, unspecified: Secondary | ICD-10-CM

## 2019-10-30 MED ORDER — ARIPIPRAZOLE 5 MG PO TABS: ORAL_TABLET | ORAL | 0 refills | Status: DC

## 2019-11-20 ENCOUNTER — Other Ambulatory Visit: Payer: Self-pay

## 2019-11-20 ENCOUNTER — Ambulatory Visit (INDEPENDENT_AMBULATORY_CARE_PROVIDER_SITE_OTHER): Payer: Medicaid Other | Admitting: Psychiatry

## 2019-11-20 ENCOUNTER — Encounter (HOSPITAL_COMMUNITY): Payer: Self-pay | Admitting: Psychiatry

## 2019-11-20 VITALS — Wt 254.0 lb

## 2019-11-20 DIAGNOSIS — F319 Bipolar disorder, unspecified: Secondary | ICD-10-CM | POA: Diagnosis not present

## 2019-11-20 DIAGNOSIS — F902 Attention-deficit hyperactivity disorder, combined type: Secondary | ICD-10-CM

## 2019-11-20 DIAGNOSIS — F419 Anxiety disorder, unspecified: Secondary | ICD-10-CM | POA: Diagnosis not present

## 2019-11-20 MED ORDER — CITALOPRAM HYDROBROMIDE 20 MG PO TABS: 20.0000 mg | ORAL_TABLET | Freq: Every day | ORAL | 0 refills | Status: DC

## 2019-11-20 MED ORDER — TRAZODONE HCL 150 MG PO TABS: 150.0000 mg | ORAL_TABLET | Freq: Every day | ORAL | 0 refills | Status: DC

## 2019-11-20 MED ORDER — LAMOTRIGINE 200 MG PO TABS: 200.0000 mg | ORAL_TABLET | Freq: Every day | ORAL | 0 refills | Status: DC

## 2019-11-20 MED ORDER — LISDEXAMFETAMINE DIMESYLATE 20 MG PO CAPS: 20.0000 mg | ORAL_CAPSULE | Freq: Every day | ORAL | 0 refills | Status: DC

## 2019-11-20 MED ORDER — CLONAZEPAM 0.5 MG PO TABS: 0.5000 mg | ORAL_TABLET | Freq: Three times a day (TID) | ORAL | 2 refills | Status: DC | PRN

## 2019-11-20 MED ORDER — ARIPIPRAZOLE 5 MG PO TABS: ORAL_TABLET | ORAL | 0 refills | Status: DC

## 2019-11-20 NOTE — Progress Notes (Signed)
Virtual Visit via Telephone Note  I connected with Sarah Salas on 11/20/19 at  9:00 AM EST by telephone and verified that I am speaking with the correct person using two identifiers.   I discussed the limitations, risks, security and privacy concerns of performing an evaluation and management service by telephone and the availability of in person appointments. I also discussed with the patient that there may be a patient responsible charge related to this service. The patient expressed understanding and agreed to proceed.   History of Present Illness: Patient was evaluated through phone session. She is sleeping better with the trazodone and her anxiety is not as bad but she still struggling with attention, concentration and focus. Now she has to take care of her niece [name] who requires a lot of attention and focus and she also taking care of her mother and stepfather. Sometime she gets overwhelmed. She has taken Adderall in the past with good response and she feels that she need to go back on medication to help her focus and attention. She has a good relationship with her sister. She denies any highs and lows, mania but her speech remains very fast and sometimes pressured. She denies drinking or using illegal substances. Her appetite is okay. She admitted weight gain in recent months because of lack of structure in her diet and exercise.   Past Psychiatric History:Reviewed. H/OADHDand took Adderall in college. Diagnosed bipolar disorder and tried doxepin, Vistaril with poor outcome. Latuda help in the beginning butget worse with increase dose.Noh/oparanoia, psychiatric inpatient treatment or any suicidal attempt.     Psychiatric Specialty Exam: Physical Exam  Review of Systems  There were no vitals taken for this visit.There is no height or weight on file to calculate BMI.  General Appearance: NA  Eye Contact:  NA  Speech:  fast  Volume:  Normal  Mood:  Anxious  Affect:  NA   Thought Process:  Descriptions of Associations: Intact  Orientation:  Full (Time, Place, and Person)  Thought Content:  Rumination  Suicidal Thoughts:  No  Homicidal Thoughts:  No  Memory:  Immediate;   Fair Recent;   Fair Remote;   Good  Judgement:  Good  Insight:  Good  Psychomotor Activity:  NA  Concentration:  Concentration: Fair and Attention Span: Fair  Recall:  [employer] of Knowledge:  Fair  Language:  Good  Akathisia:  No  Handed:  Right  AIMS (if indicated):     Assets:  Communication Skills Housing Social Support  ADL's:  Intact  Cognition:  WNL  Sleep:   ok      Assessment and Plan: Bipolar disorder type II. Anxiety. ADD, combined type.  I reviewed her records. She had tried Adderall in the past however I recommend she should try Vyvanse to help her attention, focus and it may help her organize eating habit. She agreed to give her trial. She really like trazodone and other medication which is helping her mood and anxiety. I reminded that stimulant can cause insomnia, anxiety and worsening of mania and if that happened then she need to call us immediately. She agreed with the plan. I will continue Zoloft 5 mg daily, Lamictal 200 mg daily, trazodone 150 mg at bedtime, Klonopin 0.5 mg 3 times a day as needed we will start Vyvanse 20 mg in the morning. Recommended to call us back if she has any question of any concern. Follow-up in 4 weeks.   Follow Up Instructions:  I discussed the assessment and treatment plan with the patient. The patient was provided an opportunity to ask questions and all were answered. The patient agreed with the plan and demonstrated an understanding of the instructions.   The patient was advised to call back or seek an in-person evaluation if the symptoms worsen or if the condition fails to improve as anticipated.  I provided 20 minutes of non-face-to-face time during this encounter.   Sarah Nations, MD

## 2019-11-22 ENCOUNTER — Encounter: Payer: Self-pay | Admitting: Nurse Practitioner

## 2019-11-23 ENCOUNTER — Encounter: Payer: Self-pay | Admitting: Nurse Practitioner

## 2019-11-24 ENCOUNTER — Other Ambulatory Visit: Payer: Self-pay | Admitting: Nurse Practitioner

## 2019-11-24 ENCOUNTER — Encounter: Payer: Self-pay | Admitting: Nurse Practitioner

## 2019-11-24 MED ORDER — METHOCARBAMOL 500 MG PO TABS: 500.0000 mg | ORAL_TABLET | Freq: Four times a day (QID) | ORAL | 1 refills | Status: DC

## 2019-11-24 MED ORDER — IBUPROFEN 800 MG PO TABS: 800.0000 mg | ORAL_TABLET | Freq: Three times a day (TID) | ORAL | 0 refills | Status: DC | PRN

## 2019-12-06 ENCOUNTER — Encounter: Payer: Self-pay | Admitting: Nurse Practitioner

## 2019-12-19 ENCOUNTER — Other Ambulatory Visit: Payer: Self-pay

## 2019-12-19 ENCOUNTER — Ambulatory Visit (HOSPITAL_COMMUNITY): Payer: Medicaid Other | Admitting: Psychiatry

## 2019-12-19 ENCOUNTER — Encounter: Payer: Self-pay | Admitting: Nurse Practitioner

## 2019-12-20 ENCOUNTER — Ambulatory Visit (INDEPENDENT_AMBULATORY_CARE_PROVIDER_SITE_OTHER): Payer: Medicaid Other | Admitting: Psychiatry

## 2019-12-20 ENCOUNTER — Encounter (HOSPITAL_COMMUNITY): Payer: Self-pay | Admitting: Psychiatry

## 2019-12-20 ENCOUNTER — Other Ambulatory Visit: Payer: Self-pay

## 2019-12-20 DIAGNOSIS — F3181 Bipolar II disorder: Secondary | ICD-10-CM

## 2019-12-20 DIAGNOSIS — F419 Anxiety disorder, unspecified: Secondary | ICD-10-CM | POA: Diagnosis not present

## 2019-12-20 DIAGNOSIS — F902 Attention-deficit hyperactivity disorder, combined type: Secondary | ICD-10-CM | POA: Diagnosis not present

## 2019-12-20 DIAGNOSIS — F319 Bipolar disorder, unspecified: Secondary | ICD-10-CM

## 2019-12-20 MED ORDER — ARIPIPRAZOLE 5 MG PO TABS: ORAL_TABLET | ORAL | 0 refills | Status: DC

## 2019-12-20 MED ORDER — AMPHETAMINE-DEXTROAMPHETAMINE 10 MG PO TABS: 10.0000 mg | ORAL_TABLET | Freq: Every day | ORAL | 0 refills | Status: DC

## 2019-12-20 NOTE — Progress Notes (Signed)
Virtual Visit via Telephone Note  I connected with Sarah Salas on 12/20/19 at 11:40 AM EST by telephone and verified that I am speaking with the correct person using two identifiers.   I discussed the limitations, risks, security and privacy concerns of performing an evaluation and management service by telephone and the availability of in person appointments. I also discussed with the patient that there may be a patient responsible charge related to this service. The patient expressed understanding and agreed to proceed.   History of Present Illness: Patient was evaluated through phone session.  We tried Vyvanse to help her attention and focus but she did not see a huge improvement.  She really like to go back on Adderall which she has taken in the past with good response.  She recalls taking 20 mg at that time she was in college.  She continues to struggle with attention, focus, multitasking.  She admitted has residual anxiety but she sometimes goes outside and take a deep breathe or walk and that helps and elevate her anxiety.  Her sister is very helpful and supportive.  She is sleeping at least 8 hours every night.  She is compliant with citalopram, trazodone, Abilify, Lamictal and Zoloft and Klonopin.   Past Psychiatric History:Reviewed. H/OADHDand took Adderall in college. Diagnosed bipolar disorder and tried doxepin, Vistaril with poor outcome. Latuda help in the beginning butget worse with increase dose.Noh/oparanoia, psychiatric inpatient treatment or any suicidal attempt.    Psychiatric Specialty Exam: Physical Exam  Review of Systems  There were no vitals taken for this visit.There is no height or weight on file to calculate BMI.  General Appearance: NA  Eye Contact:  NA  Speech:  fast  Volume:  Normal  Mood:  Euthymic  Affect:  NA  Thought Process:  Descriptions of Associations: Intact  Orientation:  Full (Time, Place, and Person)  Thought Content:  Rumination   Suicidal Thoughts:  No  Homicidal Thoughts:  No  Memory:  Immediate;   Good Recent;   Fair Remote;   Fair  Judgement:  Intact  Insight:  Present  Psychomotor Activity:  NA  Concentration:  Concentration: Fair and Attention Span: Fair  Recall:  [employer] of Knowledge:  Good  Language:  Good  Akathisia:  No  Handed:  Right  AIMS (if indicated):     Assets:  Communication Skills Housing Resilience Social Support  ADL's:  Intact  Cognition:  WNL  Sleep:   8 hrs      Assessment and Plan: Bipolar disorder type II.  Anxiety.  ADD, combined type.  I will discontinue Vyvanse since patient like to try Adderall which she had a good response in the past.  However I recommend given the history of mania and anxiety we will start low-dose Adderall because symptoms of mania and anxiety may get worse with higher dose of stimulant.  She agreed with the plan.  We will try Adderall 10 mg in the morning and we may consider to 15 if needed.  Discontinue Vyvanse.  Continue all other medication.  We have discussed stimulant abuse, tolerance, withdrawal and medication side effects.  Follow-up in 4 weeks.  Follow Up Instructions:    I discussed the assessment and treatment plan with the patient. The patient was provided an opportunity to ask questions and all were answered. The patient agreed with the plan and demonstrated an understanding of the instructions.   The patient was advised to call back or seek an in-person evaluation  if the symptoms worsen or if the condition fails to improve as anticipated.  I provided 15 minutes of non-face-to-face time during this encounter.   Kathlee Nations, MD

## 2019-12-21 ENCOUNTER — Telehealth (HOSPITAL_COMMUNITY): Payer: Self-pay | Admitting: *Deleted

## 2019-12-21 DIAGNOSIS — F902 Attention-deficit hyperactivity disorder, combined type: Secondary | ICD-10-CM

## 2019-12-21 NOTE — Telephone Encounter (Signed)
Pt called upset that Rx for Adderall 10mg , sent in yesterday was for only #10. Pt stated she thought she she'd be getting a month's supply. Please review and advise.

## 2019-12-22 MED ORDER — AMPHETAMINE-DEXTROAMPHETAMINE 10 MG PO TABS: 10.0000 mg | ORAL_TABLET | Freq: Every day | ORAL | 0 refills | Status: DC

## 2019-12-22 NOTE — Telephone Encounter (Signed)
Sorry. It is probably typo mistake on my end. I send new script to her pharmacy.

## 2019-12-26 ENCOUNTER — Encounter: Payer: Self-pay | Admitting: Nurse Practitioner

## 2019-12-26 ENCOUNTER — Other Ambulatory Visit: Payer: Self-pay

## 2019-12-26 ENCOUNTER — Ambulatory Visit: Payer: Medicaid Other | Attending: Nurse Practitioner | Admitting: Nurse Practitioner

## 2019-12-26 VITALS — BP 115/78 | HR 73 | Temp 97.7°F | Ht 70.0 in | Wt 261.0 lb

## 2019-12-26 DIAGNOSIS — Z114 Encounter for screening for human immunodeficiency virus [HIV]: Secondary | ICD-10-CM | POA: Diagnosis not present

## 2019-12-26 DIAGNOSIS — Z1231 Encounter for screening mammogram for malignant neoplasm of breast: Secondary | ICD-10-CM

## 2019-12-26 DIAGNOSIS — Z131 Encounter for screening for diabetes mellitus: Secondary | ICD-10-CM | POA: Diagnosis not present

## 2019-12-26 DIAGNOSIS — Z0000 Encounter for general adult medical examination without abnormal findings: Secondary | ICD-10-CM

## 2019-12-26 DIAGNOSIS — Z13228 Encounter for screening for other metabolic disorders: Secondary | ICD-10-CM | POA: Diagnosis not present

## 2019-12-26 DIAGNOSIS — E782 Mixed hyperlipidemia: Secondary | ICD-10-CM

## 2019-12-26 DIAGNOSIS — E669 Obesity, unspecified: Secondary | ICD-10-CM | POA: Diagnosis not present

## 2019-12-26 DIAGNOSIS — Z130 Encounter for screening for diseases of the blood and blood-forming organs and certain disorders involving the immune mechanism: Secondary | ICD-10-CM

## 2019-12-26 NOTE — Progress Notes (Signed)
Assessment & Plan:  Sarah Salas was seen today for annual exam.  Diagnoses and all orders for this visit:  Well woman exam without gynecological exam  Encounter for screening for HIV -     HIV antibody (with reflex)  Mixed hyperlipidemia -     Lipid panel  Screening for deficiency anemia -     CBC  Encounter for screening for diabetes mellitus -     Hemoglobin A1c  Screening for metabolic disorder -     FGH82+XHBZ  Obesity (BMI 30-39.9) -     Hemoglobin A1c -     Lipid panel -     CMP14+EGFR    Patient has been counseled on age-appropriate routine health concerns for screening and prevention. These are reviewed and up-to-date. Referrals have been placed accordingly. Immunizations are up-to-date or declined.    Subjective:   Chief Complaint  Patient presents with  . Annual Exam    Pt. is here for a physical.    HPI Sarah Salas 44 y.o. female presents to office today for physical.  States her mother is currently in the hospital with bilateral pneumonia and heart failure.  HEALTH MAINTENANCE Referral placed for mammogram Pap up-to-date  Review of Systems  Constitutional: Negative for fever, malaise/fatigue and weight loss.  HENT: Negative.  Negative for nosebleeds.   Eyes: Negative.  Negative for blurred vision, double vision and photophobia.  Respiratory: Negative.  Negative for cough and shortness of breath.   Cardiovascular: Negative.  Negative for chest pain, palpitations and leg swelling.  Gastrointestinal: Negative.  Negative for heartburn, nausea and vomiting.  Genitourinary: Negative.   Musculoskeletal: Negative.  Negative for myalgias.  Skin: Negative.   Neurological: Negative.  Negative for dizziness, focal weakness, seizures and headaches.  Endo/Heme/Allergies: Negative.   Psychiatric/Behavioral: Negative for suicidal ideas.    Past Medical History:  Diagnosis Date  . Anxiety   . Bipolar 1 disorder ([place])   . Brain injury ([place])   . Hypertension       Past Surgical History:  Procedure Laterality Date  . BRAIN SURGERY    . FRACTURE SURGERY      Family History  Problem Relation Age of Onset  . Bipolar disorder Mother   . Hypertension Mother   . Miscarriages / [place] Mother   . Vision loss Mother   . Varicose Veins Mother   . Alcohol abuse Father   . Heart disease Father   . Learning disabilities Father   . Bipolar disorder Sister   . Learning disabilities Sister   . Diabetes Paternal Aunt   . Bipolar disorder Maternal Grandmother   . Hypertension Maternal Grandfather   . Cancer Paternal Grandmother   . Cancer Paternal Grandfather     Social History Reviewed with no changes to be made today.   Outpatient Medications Prior to Visit  Medication Sig Dispense Refill  . amphetamine-dextroamphetamine (ADDERALL) 10 MG tablet Take 1 tablet (10 mg total) by mouth daily with breakfast. 30 tablet 0  . ARIPiprazole (ABILIFY) 5 MG tablet Take  1 tab (5 mg) a day 30 tablet 0  . citalopram (CELEXA) 20 MG tablet Take 1 tablet (20 mg total) by mouth daily. 90 tablet 0  . clonazePAM (KLONOPIN) 0.5 MG tablet Take 1 tablet (0.5 mg total) by mouth 3 (three) times daily as needed for anxiety. 90 tablet 2  . ibuprofen (ADVIL) 800 MG tablet Take 1 tablet (800 mg total) by mouth every 8 (eight) hours as needed. 60 tablet 0  .  lamoTRIgine (LAMICTAL) 200 MG tablet Take 1 tablet (200 mg total) by mouth daily. 90 tablet 0  . latanoprost (XALATAN) 0.005 % ophthalmic solution INSTILL 1 DROP INTO EACH EYE EVERY NIGHT AS DIRECTED    . methocarbamol (ROBAXIN) 500 MG tablet Take 1 tablet (500 mg total) by mouth 4 (four) times daily. 60 tablet 1  . SF 5000 PLUS 1.1 % CREA dental cream USE AS DIRECTED; USE INSTEAD OF CURRENT TOOTHPASTE BRUSH TWICE DAILY AND SPIT OUT TOOTHPASTE. DO NOT RINSE EAT OR DRINK AFTER USING.    . traZODone (DESYREL) 150 MG tablet Take 1 tablet (150 mg total) by mouth at bedtime. 90 tablet 0   No facility-administered  medications prior to visit.    No Known Allergies     Objective:    BP 115/78 (BP Location: Right Arm, Patient Position: Sitting, Cuff Size: Large)   Pulse 73   Temp 97.7 F (36.5 C) (Temporal)   Ht 5' 10"  (1.778 m)   Wt 261 lb (118.4 kg)   SpO2 99%   BMI 37.45 kg/m  Wt Readings from Last 3 Encounters:  12/26/19 261 lb (118.4 kg)  11/22/18 240 lb 9.6 oz (109.1 kg)  08/16/18 252 lb 9.6 oz (114.6 kg)    Physical Exam Constitutional:      Appearance: She is well-developed.  HENT:     Head: Normocephalic and atraumatic.     Right Ear: External ear normal.     Left Ear: External ear normal.     Nose: Nose normal.     Mouth/Throat:     Pharynx: No oropharyngeal exudate.  Eyes:     General: No scleral icterus.       Right eye: No discharge.     Conjunctiva/sclera: Conjunctivae normal.     Pupils: Pupils are equal, round, and reactive to light.  Neck:     Thyroid: No thyromegaly.     Trachea: No tracheal deviation.  Cardiovascular:     Rate and Rhythm: Normal rate and regular rhythm.     Heart sounds: Normal heart sounds. No murmur. No friction rub.  Pulmonary:     Effort: Pulmonary effort is normal. No accessory muscle usage or respiratory distress.     Breath sounds: Normal breath sounds. No decreased breath sounds, wheezing, rhonchi or rales.  Chest:     Chest wall: No tenderness.     Breasts: Breasts are symmetrical.        Right: No inverted nipple, mass, nipple discharge, skin change or tenderness.        Left: No inverted nipple, mass, nipple discharge, skin change or tenderness.  Abdominal:     General: Bowel sounds are normal. There is no distension.     Palpations: Abdomen is soft. There is no mass.     Tenderness: There is no abdominal tenderness. There is no guarding or rebound.  Musculoskeletal:        General: No tenderness or deformity. Normal range of motion.     Cervical back: Normal range of motion and neck supple.  Lymphadenopathy:     Cervical:  No cervical adenopathy.  Skin:    General: Skin is warm and dry.     Findings: No erythema.  Neurological:     Mental Status: She is alert and oriented to person, place, and time.     Cranial Nerves: No cranial nerve deficit.     Coordination: Coordination normal.     Deep Tendon Reflexes: Reflexes are normal and symmetric.  Psychiatric:        Speech: Speech normal.        Behavior: Behavior normal.        Thought Content: Thought content normal.        Judgment: Judgment normal.          Patient has been counseled extensively about nutrition and exercise as well as the importance of adherence with medications and regular follow-up. The patient was given clear instructions to go to ER or return to medical center if symptoms don't improve, worsen or new problems develop. The patient verbalized understanding.   Follow-up: Return if symptoms worsen or fail to improve.   Gildardo Pounds, FNP-BC [facility] and [facility] [place], [place]   12/26/2019, 1:06 PM

## 2019-12-27 LAB — CBC
Hematocrit: 44.8 % (ref 34.0–46.6)
Hemoglobin: 15 g/dL (ref 11.1–15.9)
MCH: 30.2 pg (ref 26.6–33.0)
MCHC: 33.5 g/dL (ref 31.5–35.7)
MCV: 90 fL (ref 79–97)
Platelets: 321 10*3/uL (ref 150–450)
RBC: 4.96 x10E6/uL (ref 3.77–5.28)
RDW: 13.1 % (ref 11.7–15.4)
WBC: 7 10*3/uL (ref 3.4–10.8)

## 2019-12-27 LAB — CMP14+EGFR
ALT: 14 IU/L (ref 0–32)
AST: 14 IU/L (ref 0–40)
Albumin/Globulin Ratio: 2.5 — ABNORMAL HIGH (ref 1.2–2.2)
Albumin: 4 g/dL (ref 3.8–4.8)
Alkaline Phosphatase: 74 IU/L (ref 39–117)
BUN/Creatinine Ratio: 16 (ref 9–23)
BUN: 11 mg/dL (ref 6–24)
Bilirubin Total: <0.2 mg/dL (ref 0.0–1.2)
CO2: 25 mmol/L (ref 20–29)
Calcium: 9 mg/dL (ref 8.7–10.2)
Chloride: 106 mmol/L (ref 96–106)
Creatinine, Ser: 0.7 mg/dL (ref 0.57–1.00)
GFR calc Af Amer: 124 mL/min/{1.73_m2} (ref >59)
GFR calc non Af Amer: 107 mL/min/{1.73_m2} (ref >59)
Globulin, Total: 1.6 g/dL (ref 1.5–4.5)
Glucose: 97 mg/dL (ref 65–99)
Potassium: 4.4 mmol/L (ref 3.5–5.2)
Sodium: 142 mmol/L (ref 134–144)
Total Protein: 5.6 g/dL — ABNORMAL LOW (ref 6.0–8.5)

## 2019-12-27 LAB — HIV ANTIBODY (ROUTINE TESTING W REFLEX): HIV Screen 4th Generation wRfx: NONREACTIVE

## 2019-12-27 LAB — LIPID PANEL
Chol/HDL Ratio: 3.9 ratio (ref 0.0–4.4)
Cholesterol, Total: 172 mg/dL (ref 100–199)
HDL: 44 mg/dL (ref >39)
LDL Chol Calc (NIH): 107 mg/dL — ABNORMAL HIGH (ref 0–99)
Triglycerides: 118 mg/dL (ref 0–149)
VLDL Cholesterol Cal: 21 mg/dL (ref 5–40)

## 2019-12-27 LAB — HEMOGLOBIN A1C
Est. average glucose Bld gHb Est-mCnc: 105 mg/dL
Hgb A1c MFr Bld: 5.3 % (ref 4.8–5.6)

## 2020-01-16 ENCOUNTER — Telehealth: Payer: Self-pay | Admitting: Nurse Practitioner

## 2020-01-16 DIAGNOSIS — Z23 Encounter for immunization: Secondary | ICD-10-CM | POA: Diagnosis not present

## 2020-01-16 NOTE — Telephone Encounter (Signed)
Sarah Salas from Dr  London Sheer  Called requesting a new  Ophthalmology referral .  Routine eye exam and screening. Please refer to [facility] at [phone] and fax number is [phone]. Thank you

## 2020-01-19 ENCOUNTER — Other Ambulatory Visit: Payer: Self-pay

## 2020-01-19 ENCOUNTER — Encounter (HOSPITAL_COMMUNITY): Payer: Self-pay | Admitting: Psychiatry

## 2020-01-19 ENCOUNTER — Ambulatory Visit (INDEPENDENT_AMBULATORY_CARE_PROVIDER_SITE_OTHER): Payer: Medicaid Other | Admitting: Psychiatry

## 2020-01-19 DIAGNOSIS — F902 Attention-deficit hyperactivity disorder, combined type: Secondary | ICD-10-CM

## 2020-01-19 DIAGNOSIS — F419 Anxiety disorder, unspecified: Secondary | ICD-10-CM | POA: Diagnosis not present

## 2020-01-19 DIAGNOSIS — F319 Bipolar disorder, unspecified: Secondary | ICD-10-CM

## 2020-01-19 MED ORDER — TRAZODONE HCL 150 MG PO TABS: 150.0000 mg | ORAL_TABLET | Freq: Every day | ORAL | 0 refills | Status: DC

## 2020-01-19 MED ORDER — LAMOTRIGINE 200 MG PO TABS: 200.0000 mg | ORAL_TABLET | Freq: Every day | ORAL | 0 refills | Status: DC

## 2020-01-19 MED ORDER — AMPHETAMINE-DEXTROAMPHETAMINE 10 MG PO TABS: 10.0000 mg | ORAL_TABLET | Freq: Every day | ORAL | 0 refills | Status: DC

## 2020-01-19 MED ORDER — CITALOPRAM HYDROBROMIDE 20 MG PO TABS: 20.0000 mg | ORAL_TABLET | Freq: Every day | ORAL | 0 refills | Status: DC

## 2020-01-19 MED ORDER — ARIPIPRAZOLE 5 MG PO TABS: ORAL_TABLET | ORAL | 0 refills | Status: DC

## 2020-01-19 MED ORDER — CLONAZEPAM 0.5 MG PO TABS: 0.5000 mg | ORAL_TABLET | Freq: Four times a day (QID) | ORAL | 2 refills | Status: DC | PRN

## 2020-01-19 NOTE — Progress Notes (Signed)
Virtual Visit via Telephone Note  I connected with Sarah Salas on 01/19/20 at  9:40 AM EDT by telephone and verified that I am speaking with the correct person using two identifiers.   I discussed the limitations, risks, security and privacy concerns of performing an evaluation and management service by telephone and the availability of in person appointments. I also discussed with the patient that there may be a patient responsible charge related to this service. The patient expressed understanding and agreed to proceed.   History of Present Illness: Patient was evaluated by phone session.  She is doing well on Adderall but noticed increased anxiety and nervousness.  Patient told her mother was admitted because of CHF and fluid in her lungs.  She was released on her birthday and since then she has been very nervous and anxious.  She is very busy taking her mother to doctor's appointment.  Patient told she cooks for her mother and now she has to follow the directions of low-sodium diet.  She feel overwhelmed and nervous.  However she feels Adderall helped her attention and focus and she is able to handle all her job responsibilities during the day.  She does feel her anxiety is up and despite taking Klonopin 0.5 mg 3 times a day she still feels very nervous and anxious specially when she has to take her mother to the doctor's appointment.  She is sleeping good with trazodone.  She has no tremors shakes or any EPS.  She is taking citalopram, trazodone, Abilify, Lamictal and Klonopin.  She has no rash or any itching.  Her level is unchanged from the past.  She denies any palpitation, anger, mood swing.    Past Psychiatric History:Reviewed. H/OADHD, bipolar disorder.  Vyvanse did not help.  Adderall had a good response.  Latuda help with the beginning but then got worse with increased dose.  Vistaril did not help.  Noh/oparanoia, psychiatric inpatient treatment or any suicidal attempt.  Recent  Results (from the past 2160 hour(s))  HIV antibody (with reflex)     Status: None   Collection Time: 12/26/19 10:36 AM  Result Value Ref Range   HIV Screen 4th Generation wRfx Non Reactive Non Reactive  Hemoglobin A1c     Status: None   Collection Time: 12/26/19 10:36 AM  Result Value Ref Range   Hgb A1c MFr Bld 5.3 4.8 - 5.6 %    Comment:          Prediabetes: 5.7 - 6.4          Diabetes: >6.4          Glycemic control for adults with diabetes: <7.0    Est. average glucose Bld gHb Est-mCnc 105 mg/dL  CBC     Status: None   Collection Time: 12/26/19 10:36 AM  Result Value Ref Range   WBC 7.0 3.4 - 10.8 x10E3/uL   RBC 4.96 3.77 - 5.28 x10E6/uL   Hemoglobin 15.0 11.1 - 15.9 g/dL   Hematocrit 44.8 34.0 - 46.6 %   MCV 90 79 - 97 fL   MCH 30.2 26.6 - 33.0 pg   MCHC 33.5 31.5 - 35.7 g/dL   RDW 13.1 11.7 - 15.4 %   Platelets 321 150 - 450 x10E3/uL  Lipid panel     Status: Abnormal   Collection Time: 12/26/19 10:36 AM  Result Value Ref Range   Cholesterol, Total 172 100 - 199 mg/dL   Triglycerides 118 0 - 149 mg/dL   HDL 44 >  39 mg/dL   VLDL Cholesterol Cal 21 5 - 40 mg/dL   LDL Chol Calc (NIH) 107 (H) 0 - 99 mg/dL   Chol/HDL Ratio 3.9 0.0 - 4.4 ratio    Comment:                                   T. Chol/HDL Ratio                                             Men  Women                               1/2 Avg.Risk  3.4    3.3                                   Avg.Risk  5.0    4.4                                2X Avg.Risk  9.6    7.1                                3X Avg.Risk 23.4   11.0   CMP14+EGFR     Status: Abnormal   Collection Time: 12/26/19 10:36 AM  Result Value Ref Range   Glucose 97 65 - 99 mg/dL   BUN 11 6 - 24 mg/dL   Creatinine, Ser 0.70 0.57 - 1.00 mg/dL   GFR calc non Af Amer 107 >59 mL/min/1.73   GFR calc Af Amer 124 >59 mL/min/1.73   BUN/Creatinine Ratio 16 9 - 23   Sodium 142 134 - 144 mmol/L   Potassium 4.4 3.5 - 5.2 mmol/L   Chloride 106 96 - 106 mmol/L    CO2 25 20 - 29 mmol/L   Calcium 9.0 8.7 - 10.2 mg/dL   Total Protein 5.6 (L) 6.0 - 8.5 g/dL   Albumin 4.0 3.8 - 4.8 g/dL   Globulin, Total 1.6 1.5 - 4.5 g/dL   Albumin/Globulin Ratio 2.5 (H) 1.2 - 2.2   Bilirubin Total <0.2 0.0 - 1.2 mg/dL   Alkaline Phosphatase 74 39 - 117 IU/L   AST 14 0 - 40 IU/L   ALT 14 0 - 32 IU/L      Psychiatric Specialty Exam: Physical Exam  Review of Systems  There were no vitals taken for this visit.There is no height or weight on file to calculate BMI.  General Appearance: NA  Eye Contact:  NA  Speech:  fast  Volume:  Normal  Mood:  Anxious  Affect:  NA  Thought Process:  Descriptions of Associations: Intact  Orientation:  Full (Time, Place, and Person)  Thought Content:  Rumination  Suicidal Thoughts:  No  Homicidal Thoughts:  No  Memory:  Immediate;   Good Recent;   Fair Remote;   Fair  Judgement:  Intact  Insight:  Present  Psychomotor Activity:  NA  Concentration:  Concentration: Fair and Attention Span: Fair  Recall:  [place] of Knowledge:  Good  Language:  Good  Akathisia:  No  Handed:  Right  AIMS (if indicated):     Assets:  Communication Skills Desire for Improvement Housing Resilience Social Support  ADL's:  Intact  Cognition:  WNL  Sleep:   good      Assessment and Plan: Bipolar disorder type II.  Anxiety.  ADD, combined type.  Patient recently had blood work which I reviewed.  Her hemoglobin A1c, CBC, chemistry is normal.  She is more anxious since mother had heart failure and now she is responsible to cook the food for her.  She is also taking her to the doctor's appointment.  I recommend to try extra Klonopin those days when she has to take her mother to the doctor's appointment.  We discussed should not be seen for more than 100 pills a month of Klonopin.  She agreed with that plan.  I will continue Abilify 5 mg daily, Celexa 20 mg daily, Adderall 10 mg daily, Lamictal 200 mg daily, and trazodone 150 mg at  bedtime.  Recommended to call us back if she has any question of any concern.  Follow-up in 3 months.  Follow Up Instructions:    I discussed the assessment and treatment plan with the patient. The patient was provided an opportunity to ask questions and all were answered. The patient agreed with the plan and demonstrated an understanding of the instructions.   The patient was advised to call back or seek an in-person evaluation if the symptoms worsen or if the condition fails to improve as anticipated.  I provided 20 minutes of non-face-to-face time during this encounter.   Kathlee Nations, MD

## 2020-01-20 ENCOUNTER — Other Ambulatory Visit: Payer: Self-pay | Admitting: Nurse Practitioner

## 2020-01-20 DIAGNOSIS — Z0100 Encounter for examination of eyes and vision without abnormal findings: Secondary | ICD-10-CM

## 2020-02-15 DIAGNOSIS — H401131 Primary open-angle glaucoma, bilateral, mild stage: Secondary | ICD-10-CM | POA: Diagnosis not present

## 2020-02-28 DIAGNOSIS — H5213 Myopia, bilateral: Secondary | ICD-10-CM | POA: Diagnosis not present

## 2020-03-04 DIAGNOSIS — H401131 Primary open-angle glaucoma, bilateral, mild stage: Secondary | ICD-10-CM | POA: Diagnosis not present

## 2020-03-05 ENCOUNTER — Telehealth (HOSPITAL_COMMUNITY): Payer: Self-pay | Admitting: *Deleted

## 2020-03-05 DIAGNOSIS — F902 Attention-deficit hyperactivity disorder, combined type: Secondary | ICD-10-CM

## 2020-03-05 NOTE — Telephone Encounter (Signed)
Received request for refill of ADHD Medication: Adderall 10mg  last ordered 01/19/20. Pt has upcoming appointment on 04/17/20.

## 2020-03-06 MED ORDER — AMPHETAMINE-DEXTROAMPHETAMINE 10 MG PO TABS: 10.0000 mg | ORAL_TABLET | Freq: Every day | ORAL | 0 refills | Status: DC

## 2020-03-06 NOTE — Telephone Encounter (Signed)
Done

## 2020-03-18 DIAGNOSIS — H401131 Primary open-angle glaucoma, bilateral, mild stage: Secondary | ICD-10-CM | POA: Diagnosis not present

## 2020-03-25 ENCOUNTER — Encounter: Payer: Self-pay | Admitting: Nurse Practitioner

## 2020-04-02 DIAGNOSIS — H5213 Myopia, bilateral: Secondary | ICD-10-CM | POA: Diagnosis not present

## 2020-04-08 ENCOUNTER — Telehealth (HOSPITAL_COMMUNITY): Payer: Self-pay

## 2020-04-08 DIAGNOSIS — F902 Attention-deficit hyperactivity disorder, combined type: Secondary | ICD-10-CM

## 2020-04-08 NOTE — Telephone Encounter (Signed)
Patient called requesting a refill on her Adderall 10mg  to be sent to [facility] on [address] in [place]. Followup scheduled for 04/17/20. Thank you.

## 2020-04-09 MED ORDER — AMPHETAMINE-DEXTROAMPHETAMINE 10 MG PO TABS: 10.0000 mg | ORAL_TABLET | Freq: Every day | ORAL | 0 refills | Status: DC

## 2020-04-09 NOTE — Telephone Encounter (Signed)
Done

## 2020-04-09 NOTE — Telephone Encounter (Signed)
Notified patient.

## 2020-04-17 ENCOUNTER — Telehealth (INDEPENDENT_AMBULATORY_CARE_PROVIDER_SITE_OTHER): Payer: Medicaid Other | Admitting: Psychiatry

## 2020-04-17 ENCOUNTER — Encounter (HOSPITAL_COMMUNITY): Payer: Self-pay | Admitting: Psychiatry

## 2020-04-17 ENCOUNTER — Other Ambulatory Visit: Payer: Self-pay

## 2020-04-17 VITALS — Wt 240.0 lb

## 2020-04-17 DIAGNOSIS — F419 Anxiety disorder, unspecified: Secondary | ICD-10-CM

## 2020-04-17 DIAGNOSIS — F319 Bipolar disorder, unspecified: Secondary | ICD-10-CM | POA: Diagnosis not present

## 2020-04-17 DIAGNOSIS — F902 Attention-deficit hyperactivity disorder, combined type: Secondary | ICD-10-CM

## 2020-04-17 MED ORDER — AMPHETAMINE-DEXTROAMPHETAMINE 10 MG PO TABS: 10.0000 mg | ORAL_TABLET | Freq: Every day | ORAL | 0 refills | Status: DC

## 2020-04-17 MED ORDER — TRAZODONE HCL 150 MG PO TABS: 150.0000 mg | ORAL_TABLET | Freq: Every day | ORAL | 0 refills | Status: DC

## 2020-04-17 MED ORDER — ARIPIPRAZOLE 5 MG PO TABS: ORAL_TABLET | ORAL | 0 refills | Status: DC

## 2020-04-17 MED ORDER — CITALOPRAM HYDROBROMIDE 20 MG PO TABS: 20.0000 mg | ORAL_TABLET | Freq: Every day | ORAL | 0 refills | Status: DC

## 2020-04-17 MED ORDER — LAMOTRIGINE 25 MG PO TABS: ORAL_TABLET | ORAL | 2 refills | Status: DC

## 2020-04-17 MED ORDER — CLONAZEPAM 0.5 MG PO TABS: 0.5000 mg | ORAL_TABLET | Freq: Three times a day (TID) | ORAL | 2 refills | Status: DC | PRN

## 2020-04-17 MED ORDER — LAMOTRIGINE 200 MG PO TABS: 200.0000 mg | ORAL_TABLET | Freq: Every day | ORAL | 0 refills | Status: DC

## 2020-04-17 NOTE — Progress Notes (Signed)
Virtual Visit via Telephone Note  I connected with Sarah Salas on 04/17/20 at  9:40 AM EDT by telephone and verified that I am speaking with the correct person using two identifiers.  Location: Patient: home Provider: home office   I discussed the limitations, risks, security and privacy concerns of performing an evaluation and management service by telephone and the availability of in person appointments. I also discussed with the patient that there may be a patient responsible charge related to this service. The patient expressed understanding and agreed to proceed.   History of Present Illness: Patient is evaluated by phone session.  She endorsed lately more irritability, frustration and anxiety because of her 42 year old niece Sarah Salas.  She mention her sister came and took her niece but she is still come some time and he stays with her and she makes her easily irritable.  Sometimes she cannot stand her and on July 4 when her niece came patient stays to her sister's house.  She admitted her anger gets high because her niece Sarah Salas tells her to do things when she does not like.  Otherwise she has been sleeping good.  She denies any crying spells, feeling of hopelessness.  She feels the Adderall helping her mood and she is able to do multitasking and her attention and concentration is much better.  She is concerned about her mother who has chronic health issues.  She started Atkins diet with her mother as her mother had gained a lot of weight.  She lost more than 15 pounds in past 3 months.  She takes her mother to the doctor's office.  She is taking Klonopin 0.5 mg 3 times a day when she is very nervous and anxious.  She is also compliant with Celexa, Abilify, Lamictal and reported no shakes, tremors, rash or any itching.  She is sleeping good.  She denies drinking or using any illegal substances.  She still have cycles of mania and she got impulsive but denies any hallucination or any suicidal  thoughts.   Past Psychiatric History:Reviewed. H/OADHD, bipolar disorder.  Vyvanse did not help.  Adderall had a good response.  Latuda help with the beginning but then got worse with increased dose.  Vistaril did not help.  Noh/oparanoia, psychiatric inpatient treatment or any suicidal attempt.    Psychiatric Specialty Exam: Physical Exam  Review of Systems  Weight 240 lb (108.9 kg).There is no height or weight on file to calculate BMI.  General Appearance: NA  Eye Contact:  NA  Speech:  fast  Volume:  Increased  Mood:  Anxious and Irritable  Affect:  NA  Thought Process:  Descriptions of Associations: Circumstantial  Orientation:  Full (Time, Place, and Person)  Thought Content:  Rumination  Suicidal Thoughts:  No  Homicidal Thoughts:  No  Memory:  Immediate;   Good Recent;   Fair Remote;   Fair  Judgement:  Intact  Insight:  Present  Psychomotor Activity:  NA  Concentration:  Concentration: Fair and Attention Span: Fair  Recall:  Good  Fund of Knowledge:  Good  Language:  Good  Akathisia:  No  Handed:  Right  AIMS (if indicated):     Assets:  Communication Skills Desire for Improvement Housing Resilience Social Support  ADL's:  Intact  Cognition:  WNL  Sleep:   8 hrs      Assessment and Plan: Bipolar disorder type I.  Anxiety.  ADD, combined type.  Patient lost weight since the last visit she is doing  Atkins diet with her mother.  Discussed trigger factors and recommend to avoid confrontation her 42 year old niece.  I also referred therapy but she refused.  Discussed other coping skills like start walking, walk away from the scene or go for exercise.  She agreed to work on these skills.  I also add low-dose Lamictal 25 mg twice a day along with Lamictal 200 mg to help her manic cycles.  She is taking Klonopin 0.5 mg 3 times a day which is helping her anxiety.  We will continue that.  She will also continue Abilify 5 mg daily, trazodone 150 mg at bedtime,  Celexa 20 mg daily.  Discussed medication side effects and benefits.  Recommended to call us back if she is any question or any concern.  Follow-up in 3 months.  Time spent 20 minutes.  Follow Up Instructions:    I discussed the assessment and treatment plan with the patient. The patient was provided an opportunity to ask questions and all were answered. The patient agreed with the plan and demonstrated an understanding of the instructions.   The patient was advised to call back or seek an in-person evaluation if the symptoms worsen or if the condition fails to improve as anticipated.  I provided 20 minutes of non-face-to-face time during this encounter.   Cleotis Nipper, MD

## 2020-04-19 ENCOUNTER — Other Ambulatory Visit (HOSPITAL_COMMUNITY): Payer: Self-pay | Admitting: Psychiatry

## 2020-04-19 DIAGNOSIS — F319 Bipolar disorder, unspecified: Secondary | ICD-10-CM

## 2020-04-24 ENCOUNTER — Telehealth (HOSPITAL_COMMUNITY): Payer: Self-pay

## 2020-04-24 NOTE — Telephone Encounter (Signed)
BLUE CROSS BLUE SHIELD HEALTHY BLUE PRESCRIPTION COVERAGE APPROVED  ARIPIPRAZOLE 5MG  TABLET PA# EFFECTIVE 04/24/2020 TO 04/24/2021  SPOKE WITH LAKITA

## 2020-04-24 NOTE — Telephone Encounter (Signed)
BLUE CROSS BLUE SHIELD HEALTHY BLUE PRESCRIPTION COVERAGE APPROVED  ARIPIPRAZOLE 5MG TABLET PA# [id number] EFFECTIVE 04/24/2020 TO 04/24/2021  SPOKE WITH LAKITA 

## 2020-04-24 NOTE — Telephone Encounter (Signed)
Notified pharmacy of PA approval and patient has medication notification

## 2020-05-26 ENCOUNTER — Encounter: Payer: Self-pay | Admitting: Nurse Practitioner

## 2020-06-07 ENCOUNTER — Other Ambulatory Visit (HOSPITAL_COMMUNITY): Payer: Self-pay | Admitting: Psychiatry

## 2020-06-07 DIAGNOSIS — F419 Anxiety disorder, unspecified: Secondary | ICD-10-CM

## 2020-06-10 ENCOUNTER — Telehealth (HOSPITAL_COMMUNITY): Payer: Self-pay | Admitting: *Deleted

## 2020-06-10 DIAGNOSIS — F902 Attention-deficit hyperactivity disorder, combined type: Secondary | ICD-10-CM

## 2020-06-10 MED ORDER — AMPHETAMINE-DEXTROAMPHETAMINE 10 MG PO TABS: 10.0000 mg | ORAL_TABLET | Freq: Every day | ORAL | 0 refills | Status: DC

## 2020-06-10 NOTE — Telephone Encounter (Signed)
Done

## 2020-06-10 NOTE — Telephone Encounter (Signed)
Patient called and requested refill of Adderall. Her next appt is 07/18/20. Please review and advise.

## 2020-06-12 ENCOUNTER — Encounter: Payer: Self-pay | Admitting: Nurse Practitioner

## 2020-06-13 ENCOUNTER — Other Ambulatory Visit (HOSPITAL_COMMUNITY): Payer: Self-pay | Admitting: Psychiatry

## 2020-06-13 DIAGNOSIS — F419 Anxiety disorder, unspecified: Secondary | ICD-10-CM

## 2020-06-19 ENCOUNTER — Other Ambulatory Visit (HOSPITAL_COMMUNITY): Payer: Self-pay | Admitting: Psychiatry

## 2020-06-19 DIAGNOSIS — F319 Bipolar disorder, unspecified: Secondary | ICD-10-CM

## 2020-07-01 ENCOUNTER — Other Ambulatory Visit (HOSPITAL_COMMUNITY): Payer: Self-pay | Admitting: Psychiatry

## 2020-07-01 DIAGNOSIS — F319 Bipolar disorder, unspecified: Secondary | ICD-10-CM

## 2020-07-01 DIAGNOSIS — F419 Anxiety disorder, unspecified: Secondary | ICD-10-CM

## 2020-07-04 DIAGNOSIS — H10013 Acute follicular conjunctivitis, bilateral: Secondary | ICD-10-CM | POA: Diagnosis not present

## 2020-07-04 DIAGNOSIS — H40003 Preglaucoma, unspecified, bilateral: Secondary | ICD-10-CM | POA: Diagnosis not present

## 2020-07-10 ENCOUNTER — Telehealth (HOSPITAL_COMMUNITY): Payer: Self-pay | Admitting: *Deleted

## 2020-07-10 DIAGNOSIS — F902 Attention-deficit hyperactivity disorder, combined type: Secondary | ICD-10-CM

## 2020-07-10 MED ORDER — AMPHETAMINE-DEXTROAMPHETAMINE 10 MG PO TABS: 10.0000 mg | ORAL_TABLET | Freq: Every day | ORAL | 0 refills | Status: DC

## 2020-07-10 NOTE — Telephone Encounter (Signed)
Patient called and requested refill of Adderall to last to her next appt. Her next appt is 10/7.  Please review

## 2020-07-10 NOTE — Telephone Encounter (Signed)
Done

## 2020-07-18 ENCOUNTER — Encounter (HOSPITAL_COMMUNITY): Payer: Self-pay | Admitting: Psychiatry

## 2020-07-18 ENCOUNTER — Telehealth (INDEPENDENT_AMBULATORY_CARE_PROVIDER_SITE_OTHER): Payer: Medicaid Other | Admitting: Psychiatry

## 2020-07-18 ENCOUNTER — Other Ambulatory Visit: Payer: Self-pay

## 2020-07-18 VITALS — Wt 241.0 lb

## 2020-07-18 DIAGNOSIS — F902 Attention-deficit hyperactivity disorder, combined type: Secondary | ICD-10-CM

## 2020-07-18 DIAGNOSIS — F419 Anxiety disorder, unspecified: Secondary | ICD-10-CM

## 2020-07-18 DIAGNOSIS — F319 Bipolar disorder, unspecified: Secondary | ICD-10-CM

## 2020-07-18 MED ORDER — CITALOPRAM HYDROBROMIDE 20 MG PO TABS: 20.0000 mg | ORAL_TABLET | Freq: Every day | ORAL | 0 refills | Status: DC

## 2020-07-18 MED ORDER — CLONAZEPAM 0.5 MG PO TABS: 0.5000 mg | ORAL_TABLET | Freq: Three times a day (TID) | ORAL | 2 refills | Status: DC | PRN

## 2020-07-18 MED ORDER — ARIPIPRAZOLE 5 MG PO TABS: ORAL_TABLET | ORAL | 0 refills | Status: DC

## 2020-07-18 MED ORDER — LAMOTRIGINE 25 MG PO TABS: 25.0000 mg | ORAL_TABLET | Freq: Two times a day (BID) | ORAL | 2 refills | Status: DC

## 2020-07-18 MED ORDER — TRAZODONE HCL 150 MG PO TABS: 150.0000 mg | ORAL_TABLET | Freq: Every day | ORAL | 0 refills | Status: DC

## 2020-07-18 MED ORDER — LAMOTRIGINE 200 MG PO TABS: 200.0000 mg | ORAL_TABLET | Freq: Every day | ORAL | 0 refills | Status: DC

## 2020-07-18 NOTE — Progress Notes (Signed)
Virtual Visit via Telephone Note  I connected with Gomez Cleverly on 07/18/20 at  8:40 AM EDT by telephone and verified that I am speaking with the correct person using two identifiers.  Location: Patient: sister home Provider: home office   I discussed the limitations, risks, security and privacy concerns of performing an evaluation and management service by telephone and the availability of in person appointments. I also discussed with the patient that there may be a patient responsible charge related to this service. The patient expressed understanding and agreed to proceed.   History of Present Illness: Patient is evaluated by phone session.  On the last visit we increased Lamictal and she is taking now 250 mg.  She reported no tremors, shakes or any EPS.  She is feeling good because her mother is also going well.  She is pleased by mother Adriana Simas last week and she really enjoyed her food.  She still struggles sometimes with attention and focus but overall she is able to function.  She started Atkins diet but did not like and admitted few pounds weight gain.  However now she is again thinking to cut down her car.  She had a good support from her sister.  Patient like fall weather and enjoys going outside.  She denies any mood swings, mania, anger, crying spells.  She denies any suicidal thoughts.  She has no tremors shakes or any EPS.  She has alarm that reminds her to take the medication.  She wants to keep her current medication.   Past Psychiatric History: H/OADHD, bipolar disorder. Vyvanse did not help. Adderall helped. Latuda help in the beginning but then got worse with increased dose. Vistaril did not help. Noh/oparanoia, inpatient treatment or suicidal attempt.    Psychiatric Specialty Exam: Physical Exam  Review of Systems  Weight 241 lb (109.3 kg).Body mass index is 34.58 kg/m.  General Appearance: NA  Eye Contact:  NA  Speech:  fast  Volume:  Increased  Mood:   Anxious  Affect:  NA  Thought Process:  Descriptions of Associations: Circumstantial  Orientation:  Full (Time, Place, and Person)  Thought Content:  Rumination  Suicidal Thoughts:  No  Homicidal Thoughts:  No  Memory:  Immediate;   Good Recent;   Fair Remote;   Fair  Judgement:  Intact  Insight:  Shallow  Psychomotor Activity:  NA  Concentration:  Concentration: Fair and Attention Span: Fair  Recall:  [employer] of Knowledge:  Good  Language:  Good  Akathisia:  No  Handed:  Right  AIMS (if indicated):     Assets:  Communication Skills Desire for Improvement Housing Social Support  ADL's:  Intact  Cognition:  WNL  Sleep:   ok      Assessment and Plan: Bipolar disorder type I.  Anxiety.  ADD, combined type.  Patient doing better since we added extra Lamictal.  She has no rash or any itching.  Her manic cycles are not as intense.  Continue Klonopin 0.5 mg 3 times a day, Celexa 20 mg daily, trazodone 150 mg at bedtime, lamotrigine 250 mg daily and Adderall 10 mg daily.  Discussed healthy lifestyle and exercise.  Recommended to call us back if she has any question or any concern.  Follow-up in 3 months.  Follow Up Instructions:    I discussed the assessment and treatment plan with the patient. The patient was provided an opportunity to ask questions and all were answered. The patient agreed with the plan and  demonstrated an understanding of the instructions.   The patient was advised to call back or seek an in-person evaluation if the symptoms worsen or if the condition fails to improve as anticipated.  I provided 17 minutes of non-face-to-face time during this encounter.   Cleotis Nipper, MD

## 2020-07-19 ENCOUNTER — Other Ambulatory Visit (HOSPITAL_COMMUNITY): Payer: Self-pay | Admitting: Psychiatry

## 2020-07-19 DIAGNOSIS — F319 Bipolar disorder, unspecified: Secondary | ICD-10-CM

## 2020-08-02 ENCOUNTER — Other Ambulatory Visit (HOSPITAL_COMMUNITY): Payer: Self-pay | Admitting: Psychiatry

## 2020-08-02 DIAGNOSIS — F319 Bipolar disorder, unspecified: Secondary | ICD-10-CM

## 2020-08-08 ENCOUNTER — Telehealth (HOSPITAL_COMMUNITY): Payer: Self-pay | Admitting: *Deleted

## 2020-08-08 DIAGNOSIS — F902 Attention-deficit hyperactivity disorder, combined type: Secondary | ICD-10-CM

## 2020-08-08 MED ORDER — AMPHETAMINE-DEXTROAMPHETAMINE 10 MG PO TABS: 10.0000 mg | ORAL_TABLET | Freq: Every day | ORAL | 0 refills | Status: DC

## 2020-08-08 NOTE — Telephone Encounter (Signed)
Done

## 2020-08-08 NOTE — Telephone Encounter (Signed)
Pt called requesting a refill of the Adderall. Pt has an upcoming appointment on 10/15/20. Please review.

## 2020-09-04 ENCOUNTER — Other Ambulatory Visit (HOSPITAL_COMMUNITY): Payer: Self-pay | Admitting: Psychiatry

## 2020-09-04 DIAGNOSIS — F419 Anxiety disorder, unspecified: Secondary | ICD-10-CM

## 2020-09-09 ENCOUNTER — Telehealth (HOSPITAL_COMMUNITY): Payer: Self-pay | Admitting: *Deleted

## 2020-09-09 DIAGNOSIS — F902 Attention-deficit hyperactivity disorder, combined type: Secondary | ICD-10-CM

## 2020-09-09 MED ORDER — AMPHETAMINE-DEXTROAMPHETAMINE 10 MG PO TABS: 10.0000 mg | ORAL_TABLET | Freq: Every day | ORAL | 0 refills | Status: DC

## 2020-09-09 NOTE — Telephone Encounter (Signed)
Pt called requesting refill of the Adderall. Pt has an appointment upcoming on 10/15/20. Please review.

## 2020-09-09 NOTE — Telephone Encounter (Signed)
Done

## 2020-09-17 ENCOUNTER — Encounter: Payer: Self-pay | Admitting: Nurse Practitioner

## 2020-09-19 ENCOUNTER — Other Ambulatory Visit: Payer: Self-pay | Admitting: Nurse Practitioner

## 2020-09-19 MED ORDER — DEXTROMETHORPHAN HBR 15 MG/5ML PO SYRP: 10.0000 mL | ORAL_SOLUTION | Freq: Four times a day (QID) | ORAL | 0 refills | Status: DC | PRN

## 2020-09-19 MED ORDER — BENZONATATE 100 MG PO CAPS: 100.0000 mg | ORAL_CAPSULE | Freq: Three times a day (TID) | ORAL | 0 refills | Status: DC | PRN

## 2020-10-08 ENCOUNTER — Telehealth (HOSPITAL_COMMUNITY): Payer: Self-pay | Admitting: *Deleted

## 2020-10-08 DIAGNOSIS — F902 Attention-deficit hyperactivity disorder, combined type: Secondary | ICD-10-CM

## 2020-10-08 MED ORDER — AMPHETAMINE-DEXTROAMPHETAMINE 10 MG PO TABS: 10.0000 mg | ORAL_TABLET | Freq: Every day | ORAL | 0 refills | Status: DC

## 2020-10-08 NOTE — Telephone Encounter (Signed)
Done

## 2020-10-08 NOTE — Telephone Encounter (Signed)
Pt called requesting refill of the Adderall. Pt has an appointment on 10/15/20.

## 2020-10-15 ENCOUNTER — Other Ambulatory Visit (HOSPITAL_COMMUNITY): Payer: Self-pay | Admitting: Psychiatry

## 2020-10-15 ENCOUNTER — Other Ambulatory Visit: Payer: Self-pay

## 2020-10-15 ENCOUNTER — Telehealth (INDEPENDENT_AMBULATORY_CARE_PROVIDER_SITE_OTHER): Payer: Medicaid Other | Admitting: Psychiatry

## 2020-10-15 ENCOUNTER — Encounter (HOSPITAL_COMMUNITY): Payer: Self-pay | Admitting: Psychiatry

## 2020-10-15 DIAGNOSIS — F319 Bipolar disorder, unspecified: Secondary | ICD-10-CM

## 2020-10-15 DIAGNOSIS — F419 Anxiety disorder, unspecified: Secondary | ICD-10-CM | POA: Diagnosis not present

## 2020-10-15 DIAGNOSIS — F902 Attention-deficit hyperactivity disorder, combined type: Secondary | ICD-10-CM | POA: Diagnosis not present

## 2020-10-15 MED ORDER — TRAZODONE HCL 150 MG PO TABS: 150.0000 mg | ORAL_TABLET | Freq: Every day | ORAL | 0 refills | Status: DC

## 2020-10-15 MED ORDER — LAMOTRIGINE 25 MG PO TABS: 25.0000 mg | ORAL_TABLET | Freq: Two times a day (BID) | ORAL | 2 refills | Status: DC

## 2020-10-15 MED ORDER — CLONAZEPAM 0.5 MG PO TABS
0.5000 mg | ORAL_TABLET | Freq: Three times a day (TID) | ORAL | 2 refills | Status: DC | PRN
Start: 2020-10-15 — End: 2021-01-14

## 2020-10-15 MED ORDER — LAMOTRIGINE 200 MG PO TABS: 200.0000 mg | ORAL_TABLET | Freq: Every day | ORAL | 0 refills | Status: DC

## 2020-10-15 MED ORDER — ARIPIPRAZOLE 5 MG PO TABS: ORAL_TABLET | ORAL | 0 refills | Status: DC

## 2020-10-15 MED ORDER — CITALOPRAM HYDROBROMIDE 20 MG PO TABS
20.0000 mg | ORAL_TABLET | Freq: Every day | ORAL | 0 refills | Status: DC
Start: 2020-10-15 — End: 2021-01-14

## 2020-10-15 NOTE — Progress Notes (Signed)
Virtual Visit via Telephone Note  I connected with Sarah Salas on 10/15/20 at  8:40 AM EST by telephone and verified that I am speaking with the correct person using two identifiers.  Location: Patient: Home Provider: Home Office   I discussed the limitations, risks, security and privacy concerns of performing an evaluation and management service by telephone and the availability of in person appointments. I also discussed with the patient that there may be a patient responsible charge related to this service. The patient expressed understanding and agreed to proceed.   History of Present Illness: Patient is evaluated by phone session.  She is on the phone by herself.  She reported Christmas was very emotional and she was very excited.  She admitted impulsive eating but mother was able to redirect her.  She is sleeping better.  She feels the current medicine is working and even though she had some highs and lows but able to redirect herself.  Her attention and concentration is fair.  She endorsed weight gain 1 to 2 pounds but she is hoping that it will come down as he justify weight gain because of Christmas.  She had a good support from her sister and mother.  She endorsed sometimes having racing thoughts and sometimes she feels her mind is jumping from one thought to other thoughts but she denies any crying spells, anger, suicidal thoughts or any feeling of hopelessness.  She denies any panic attack.  She takes Klonopin which is helping her anxiety and usually calm her down when she gets emotional.  She denies any tremors or shakes.  She does have alarm that reminds her to take the medicine on time.   Past Psychiatric History: H/OADHD, bipolar disorder. Vyvanse did not help. Adderall helped. Latuda help in the beginning but then got worse with increased dose. Vistaril did not help. Noh/oparanoia, inpatient treatment or suicidal attempt.   Psychiatric Specialty Exam: Physical Exam   Review of Systems  Weight 242 lb (109.8 kg).There is no height or weight on file to calculate BMI.  General Appearance: NA  Eye Contact:  NA  Speech:  Pressured and fast  Volume:  Increased  Mood:  Anxious  Affect:  NA  Thought Process:  Descriptions of Associations: Circumstantial  Orientation:  Full (Time, Place, and Person)  Thought Content:  Rumination  Suicidal Thoughts:  No  Homicidal Thoughts:  No  Memory:  Immediate;   Good Recent;   Fair Remote;   Fair  Judgement:  Intact  Insight:  Fair  Psychomotor Activity:  NA  Concentration:  Concentration: Fair and Attention Span: Fair  Recall:  [employer] of Knowledge:  Fair  Language:  Fair  Akathisia:  No  Handed:  Right  AIMS (if indicated):     Assets:  Communication Skills Desire for Improvement Housing Social Support  ADL's:  Intact  Cognition:  WNL  Sleep:   better      Assessment and Plan: Bipolar disorder type I.  Anxiety.  ADD, combined type.  Patient is a stable on her medication.  Her mania is not as intense and her attention concentration is better.  She does not want to change the medication since it is working well.  I will continue Celexa 20 mg daily, trazodone 150 mg at bedtime, lamotrigine to 50 mg daily, Adderall 10 mg daily, Klonopin 0.5 mg 3 times a day.  Discussed medication side effects and benefits.  Discussed benzodiazepine and stimulants abuse tolerance and withdrawal.  Recommended  to call us back if she is any question or any concern.  Follow-up in 3 months.  Follow Up Instructions:    I discussed the assessment and treatment plan with the patient. The patient was provided an opportunity to ask questions and all were answered. The patient agreed with the plan and demonstrated an understanding of the instructions.   The patient was advised to call back or seek an in-person evaluation if the symptoms worsen or if the condition fails to improve as anticipated.  I provided 16 minutes of  non-face-to-face time during this encounter.   Cleotis Nipper, MD

## 2020-11-07 ENCOUNTER — Telehealth (HOSPITAL_COMMUNITY): Payer: Self-pay | Admitting: *Deleted

## 2020-11-07 DIAGNOSIS — F902 Attention-deficit hyperactivity disorder, combined type: Secondary | ICD-10-CM

## 2020-11-07 MED ORDER — AMPHETAMINE-DEXTROAMPHETAMINE 10 MG PO TABS: 10.0000 mg | ORAL_TABLET | Freq: Every day | ORAL | 0 refills | Status: DC

## 2020-11-07 NOTE — Telephone Encounter (Signed)
Pt called requesting a refill on the adderall. Pt has an appointment scheduled for 01/14/21. Thanks.

## 2020-11-07 NOTE — Telephone Encounter (Signed)
Done

## 2020-11-11 ENCOUNTER — Telehealth (HOSPITAL_COMMUNITY): Payer: Self-pay | Admitting: *Deleted

## 2020-11-11 NOTE — Telephone Encounter (Signed)
She should stop the Adderall and increase Abilify 10 mg.  Continue all other medications.  If symptoms do not improved in 10 days then she may need an earlier appointment.

## 2020-11-11 NOTE — Telephone Encounter (Signed)
Writer spoke with pt who has pressured, rapid speech, who is c/o being hypomanic. Pt states that she can't sit still, decreased focus and feels she needs to be going and doing something all the time. Pt also says she's picking at her cuticles and lips. Klonopin helps with anxiety but does not slow pt thoughts or actions. Please review and advise. Pt next appointment is on 01/14/21.

## 2020-11-22 ENCOUNTER — Telehealth (HOSPITAL_COMMUNITY): Payer: Self-pay | Admitting: *Deleted

## 2020-11-22 NOTE — Telephone Encounter (Signed)
Pt called to share that she has been feeling much better after increasing the Abilify and also that she has been the Adderall x three days with no issues. FYI.

## 2020-11-25 ENCOUNTER — Encounter: Payer: Self-pay | Admitting: Nurse Practitioner

## 2020-11-25 ENCOUNTER — Other Ambulatory Visit: Payer: Self-pay

## 2020-11-25 ENCOUNTER — Ambulatory Visit: Payer: Medicaid Other | Attending: Nurse Practitioner | Admitting: Nurse Practitioner

## 2020-11-25 ENCOUNTER — Other Ambulatory Visit (HOSPITAL_COMMUNITY)
Admission: RE | Admit: 2020-11-25 | Discharge: 2020-11-25 | Disposition: A | Discharge status: Home / Self Care | Payer: Medicaid Other | Source: Ambulatory Visit | Attending: Nurse Practitioner | Admitting: Nurse Practitioner

## 2020-11-25 ENCOUNTER — Other Ambulatory Visit: Payer: Self-pay | Admitting: Nurse Practitioner

## 2020-11-25 VITALS — BP 106/67 | HR 79 | Temp 98.7°F | Ht 70.0 in | Wt 245.0 lb

## 2020-11-25 DIAGNOSIS — E669 Obesity, unspecified: Secondary | ICD-10-CM

## 2020-11-25 DIAGNOSIS — E778 Other disorders of glycoprotein metabolism: Secondary | ICD-10-CM

## 2020-11-25 DIAGNOSIS — Z1159 Encounter for screening for other viral diseases: Secondary | ICD-10-CM | POA: Diagnosis not present

## 2020-11-25 DIAGNOSIS — Z1231 Encounter for screening mammogram for malignant neoplasm of breast: Secondary | ICD-10-CM

## 2020-11-25 DIAGNOSIS — Z01419 Encounter for gynecological examination (general) (routine) without abnormal findings: Secondary | ICD-10-CM | POA: Diagnosis not present

## 2020-11-25 DIAGNOSIS — E785 Hyperlipidemia, unspecified: Secondary | ICD-10-CM | POA: Diagnosis not present

## 2020-11-25 DIAGNOSIS — Z130 Encounter for screening for diseases of the blood and blood-forming organs and certain disorders involving the immune mechanism: Secondary | ICD-10-CM | POA: Diagnosis not present

## 2020-11-25 NOTE — Telephone Encounter (Signed)
Thanks for update information.

## 2020-11-25 NOTE — Patient Instructions (Addendum)
MY CHART USERNAME:  SURGERRBEE

## 2020-11-25 NOTE — Progress Notes (Signed)
Assessment & Plan:  Sarah Salas was seen today for annual exam.  Diagnoses and all orders for this visit:  Well woman exam with routine gynecological exam -     Cytology - PAP -     Cervicovaginal ancillary only  Hypoproteinemia (HCC) -     CMP14+EGFR  Dyslipidemia, goal LDL below 100 -     Lipid panel  Obesity (BMI 30-39.9) -     Hemoglobin A1c  Screening for deficiency anemia -     CBC  Need for hepatitis C screening test -     HCV Ab w Reflex to Quant PCR  Breast cancer screening by mammogram -     MM 3D SCREEN BREAST BILATERAL; Future    Patient has been counseled on age-appropriate routine health concerns for screening and prevention. These are reviewed and up-to-date. Referrals have been placed accordingly. Immunizations are up-to-date or declined.    Subjective:   Chief Complaint  Patient presents with  . Annual Exam    Patient is here for a physical exam.    HPI Sarah Salas 45 y.o. female presents to office today for well woman exam. She has a mammogram scheduled for march however I am unable to see her scheduled appointment in [place].     Review of Systems  Constitutional: Negative.  Negative for chills, fever, malaise/fatigue and weight loss.  Respiratory: Negative.  Negative for cough, sputum production, shortness of breath and wheezing.   Cardiovascular: Negative.  Negative for chest pain and leg swelling.  Gastrointestinal: Negative.  Negative for abdominal pain, blood in stool, constipation, diarrhea, heartburn, melena, nausea and vomiting.  Genitourinary: Negative.   Skin: Negative.  Negative for rash.  Neurological: Negative.  Negative for dizziness, tremors, speech change, focal weakness, seizures and headaches.  Psychiatric/Behavioral: Negative.  Negative for depression and suicidal ideas. The patient is not nervous/anxious and does not have insomnia.     Past Medical History:  Diagnosis Date  . Anxiety   . Bipolar 1 disorder ([place])   . Brain  injury ([place])   . Hypertension     Past Surgical History:  Procedure Laterality Date  . BRAIN SURGERY    . FRACTURE SURGERY      Family History  Problem Relation Age of Onset  . Bipolar disorder Mother   . Hypertension Mother   . Miscarriages / [place] Mother   . Vision loss Mother   . Varicose Veins Mother   . Alcohol abuse Father   . Heart disease Father   . Learning disabilities Father   . Bipolar disorder Sister   . Learning disabilities Sister   . Diabetes Paternal Aunt   . Bipolar disorder Maternal Grandmother   . Hypertension Maternal Grandfather   . Cancer Paternal Grandmother   . Cancer Paternal Grandfather     Social History Reviewed with no changes to be made today.   Outpatient Medications Prior to Visit  Medication Sig Dispense Refill  . amphetamine-dextroamphetamine (ADDERALL) 10 MG tablet Take 1 tablet (10 mg total) by mouth daily with breakfast. 30 tablet 0  . ARIPiprazole (ABILIFY) 5 MG tablet Take 1 tablet by mouth once daily 90 tablet 0  . benzonatate (TESSALON PERLES) 100 MG capsule Take 1 capsule (100 mg total) by mouth 3 (three) times daily as needed for cough. 21 capsule 0  . citalopram (CELEXA) 20 MG tablet Take 1 tablet (20 mg total) by mouth daily. 90 tablet 0  . clonazePAM (KLONOPIN) 0.5 MG tablet Take 1 tablet (  0.5 mg total) by mouth 3 (three) times daily as needed for anxiety. 90 tablet 2  . dextromethorphan 15 MG/5ML syrup Take 10 mLs (30 mg total) by mouth 4 (four) times daily as needed for cough. 240 mL 0  . ibuprofen (ADVIL) 800 MG tablet Take 1 tablet (800 mg total) by mouth every 8 (eight) hours as needed. 60 tablet 0  . lamoTRIgine (LAMICTAL) 200 MG tablet Take 1 tablet (200 mg total) by mouth daily. 90 tablet 0  . lamoTRIgine (LAMICTAL) 25 MG tablet Take 1 tablet (25 mg total) by mouth 2 (two) times daily. 60 tablet 2  . latanoprost (XALATAN) 0.005 % ophthalmic solution INSTILL 1 DROP INTO EACH EYE EVERY NIGHT AS DIRECTED    .  methocarbamol (ROBAXIN) 500 MG tablet Take 1 tablet (500 mg total) by mouth 4 (four) times daily. 60 tablet 1  . SF 5000 PLUS 1.1 % CREA dental cream USE AS DIRECTED; USE INSTEAD OF CURRENT TOOTHPASTE BRUSH TWICE DAILY AND SPIT OUT TOOTHPASTE. DO NOT RINSE EAT OR DRINK AFTER USING.    . traZODone (DESYREL) 150 MG tablet Take 1 tablet (150 mg total) by mouth at bedtime. 90 tablet 0   No facility-administered medications prior to visit.    No Known Allergies     Objective:    BP 106/67 (BP Location: Left Arm, Patient Position: Sitting, Cuff Size: Large)   Pulse 79   Temp 98.7 F (37.1 C) (Oral)   Ht 5' 10"  (1.778 m)   Wt 245 lb (111.1 kg)   SpO2 100%   BMI 35.15 kg/m  Wt Readings from Last 3 Encounters:  11/25/20 245 lb (111.1 kg)  12/26/19 261 lb (118.4 kg)  11/22/18 240 lb 9.6 oz (109.1 kg)    Physical Exam Exam conducted with a chaperone present.  Constitutional:      General: She is not in acute distress.    Appearance: She is well-developed and well-nourished. She is not diaphoretic.  HENT:     Head: Normocephalic and atraumatic.     Right Ear: External ear normal.     Left Ear: External ear normal.     Nose: Nose normal.     Mouth/Throat:     Mouth: Oropharynx is clear and moist.     Pharynx: No oropharyngeal exudate.  Eyes:     General: No scleral icterus.       Right eye: No discharge.        Left eye: No discharge.     Extraocular Movements: EOM normal.     Conjunctiva/sclera: Conjunctivae normal.     Pupils: Pupils are equal, round, and reactive to light.  Neck:     Thyroid: No thyromegaly.     Trachea: No tracheal deviation.  Cardiovascular:     Rate and Rhythm: Normal rate and regular rhythm.     Pulses: Intact distal pulses.     Heart sounds: Normal heart sounds. No murmur heard. No friction rub.  Pulmonary:     Effort: Pulmonary effort is normal. No respiratory distress.     Breath sounds: Normal breath sounds. No decreased breath sounds, wheezing,  rhonchi or rales.  Chest:     Chest wall: No tenderness.  Breasts:     Right: No inverted nipple, mass, nipple discharge, skin change or tenderness.     Left: No inverted nipple, mass, nipple discharge, skin change or tenderness.      Comments: Numerous cystic lesions in both breasts. Nontender Abdominal:  General: Bowel sounds are normal. There is no distension.     Palpations: Abdomen is soft. There is no mass.     Tenderness: There is no abdominal tenderness. There is no guarding or rebound.     Hernia: There is no hernia in the right inguinal area or left inguinal area.  Genitourinary:    Exam position: Lithotomy position.     Labia: No labial fusion.        Right: No rash, tenderness, lesion or injury.        Left: No rash, tenderness, lesion or injury.      Vagina: Normal. No vaginal discharge, erythema, tenderness or bleeding.     Cervix: Cervical bleeding (minimal bleeding with insertion of cytology broom) present. No cervical motion tenderness, discharge, friability or erythema.     Uterus: Not tender.      Adnexa:        Right: No mass, tenderness or fullness.         Left: No mass, tenderness or fullness.       Rectum: No mass, anal fissure or external hemorrhoid. Normal anal tone.     Comments: IUD strings intact Musculoskeletal:        General: No tenderness, deformity or edema. Normal range of motion.     Cervical back: Normal range of motion and neck supple.  Lymphadenopathy:     Cervical: No cervical adenopathy.  Skin:    General: Skin is warm and dry.     Coloration: Skin is not pale.     Findings: No erythema or rash.  Neurological:     Mental Status: She is alert and oriented to person, place, and time.     Cranial Nerves: No cranial nerve deficit.     Coordination: Coordination normal.  Psychiatric:        Mood and Affect: Mood and affect normal.        Behavior: Behavior normal.        Thought Content: Thought content normal.        Judgment:  Judgment normal.          Patient has been counseled extensively about nutrition and exercise as well as the importance of adherence with medications and regular follow-up. The patient was given clear instructions to go to ER or return to medical center if symptoms don't improve, worsen or new problems develop. The patient verbalized understanding.   Follow-up: Return if symptoms worsen or fail to improve.   Gildardo Pounds, FNP-BC [facility] [place], [place]   11/25/2020, 9:12 AM

## 2020-11-26 LAB — HCV INTERPRETATION

## 2020-11-26 LAB — LIPID PANEL
Chol/HDL Ratio: 5 ratio — ABNORMAL HIGH (ref 0.0–4.4)
Cholesterol, Total: 194 mg/dL (ref 100–199)
HDL: 39 mg/dL — ABNORMAL LOW (ref >39)
LDL Chol Calc (NIH): 126 mg/dL — ABNORMAL HIGH (ref 0–99)
Triglycerides: 162 mg/dL — ABNORMAL HIGH (ref 0–149)
VLDL Cholesterol Cal: 29 mg/dL (ref 5–40)

## 2020-11-26 LAB — CMP14+EGFR
ALT: 15 IU/L (ref 0–32)
AST: 13 IU/L (ref 0–40)
Albumin/Globulin Ratio: 2.4 — ABNORMAL HIGH (ref 1.2–2.2)
Albumin: 4.4 g/dL (ref 3.8–4.8)
Alkaline Phosphatase: 99 IU/L (ref 44–121)
BUN/Creatinine Ratio: 12 (ref 9–23)
BUN: 10 mg/dL (ref 6–24)
Bilirubin Total: <0.2 mg/dL (ref 0.0–1.2)
CO2: 27 mmol/L (ref 20–29)
Calcium: 8.8 mg/dL (ref 8.7–10.2)
Chloride: 101 mmol/L (ref 96–106)
Creatinine, Ser: 0.83 mg/dL (ref 0.57–1.00)
GFR calc Af Amer: 101 mL/min/{1.73_m2} (ref >59)
GFR calc non Af Amer: 87 mL/min/{1.73_m2} (ref >59)
Globulin, Total: 1.8 g/dL (ref 1.5–4.5)
Glucose: 97 mg/dL (ref 65–99)
Potassium: 3.7 mmol/L (ref 3.5–5.2)
Sodium: 143 mmol/L (ref 134–144)
Total Protein: 6.2 g/dL (ref 6.0–8.5)

## 2020-11-26 LAB — CERVICOVAGINAL ANCILLARY ONLY
Bacterial Vaginitis (gardnerella): NEGATIVE
Candida Glabrata: NEGATIVE
Candida Vaginitis: NEGATIVE
Chlamydia: NEGATIVE
Comment: NEGATIVE
Comment: NEGATIVE
Comment: NEGATIVE
Comment: NEGATIVE
Comment: NEGATIVE
Comment: NORMAL
Neisseria Gonorrhea: NEGATIVE
Trichomonas: NEGATIVE

## 2020-11-26 LAB — CBC
Hematocrit: 45.2 % (ref 34.0–46.6)
Hemoglobin: 15.1 g/dL (ref 11.1–15.9)
MCH: 29.2 pg (ref 26.6–33.0)
MCHC: 33.4 g/dL (ref 31.5–35.7)
MCV: 87 fL (ref 79–97)
Platelets: 314 10*3/uL (ref 150–450)
RBC: 5.18 x10E6/uL (ref 3.77–5.28)
RDW: 13 % (ref 11.7–15.4)
WBC: 7.8 10*3/uL (ref 3.4–10.8)

## 2020-11-26 LAB — HCV AB W REFLEX TO QUANT PCR: HCV Ab: <0.1 s/co ratio (ref 0.0–0.9)

## 2020-11-26 LAB — HEMOGLOBIN A1C
Est. average glucose Bld gHb Est-mCnc: 103 mg/dL
Hgb A1c MFr Bld: 5.2 % (ref 4.8–5.6)

## 2020-11-28 LAB — CYTOLOGY - PAP
Comment: NEGATIVE
Diagnosis: NEGATIVE
High risk HPV: NEGATIVE

## 2020-12-04 ENCOUNTER — Other Ambulatory Visit (HOSPITAL_COMMUNITY): Payer: Self-pay | Admitting: Psychiatry

## 2020-12-04 ENCOUNTER — Encounter: Payer: Self-pay | Admitting: Nurse Practitioner

## 2020-12-04 DIAGNOSIS — F419 Anxiety disorder, unspecified: Secondary | ICD-10-CM

## 2020-12-24 ENCOUNTER — Other Ambulatory Visit (HOSPITAL_COMMUNITY): Payer: Self-pay | Admitting: Psychiatry

## 2020-12-24 DIAGNOSIS — F319 Bipolar disorder, unspecified: Secondary | ICD-10-CM

## 2020-12-30 ENCOUNTER — Other Ambulatory Visit: Payer: Self-pay

## 2020-12-30 ENCOUNTER — Ambulatory Visit
Admission: RE | Admit: 2020-12-30 | Discharge: 2020-12-30 | Disposition: A | Discharge status: Home / Self Care | Payer: Medicaid Other | Source: Ambulatory Visit | Attending: Nurse Practitioner | Admitting: Nurse Practitioner

## 2020-12-30 DIAGNOSIS — Z1231 Encounter for screening mammogram for malignant neoplasm of breast: Secondary | ICD-10-CM | POA: Diagnosis not present

## 2021-01-01 ENCOUNTER — Other Ambulatory Visit: Payer: Self-pay | Admitting: Nurse Practitioner

## 2021-01-01 DIAGNOSIS — R928 Other abnormal and inconclusive findings on diagnostic imaging of breast: Secondary | ICD-10-CM

## 2021-01-02 ENCOUNTER — Encounter: Payer: Self-pay | Admitting: Nurse Practitioner

## 2021-01-06 ENCOUNTER — Other Ambulatory Visit (HOSPITAL_COMMUNITY): Payer: Self-pay | Admitting: Psychiatry

## 2021-01-06 ENCOUNTER — Encounter: Payer: Self-pay | Admitting: Nurse Practitioner

## 2021-01-06 DIAGNOSIS — F419 Anxiety disorder, unspecified: Secondary | ICD-10-CM

## 2021-01-14 ENCOUNTER — Other Ambulatory Visit: Payer: Self-pay

## 2021-01-14 ENCOUNTER — Encounter (HOSPITAL_COMMUNITY): Payer: Self-pay | Admitting: Psychiatry

## 2021-01-14 ENCOUNTER — Telehealth (INDEPENDENT_AMBULATORY_CARE_PROVIDER_SITE_OTHER): Payer: Medicaid Other | Admitting: Psychiatry

## 2021-01-14 VITALS — Wt 240.0 lb

## 2021-01-14 DIAGNOSIS — F419 Anxiety disorder, unspecified: Secondary | ICD-10-CM | POA: Diagnosis not present

## 2021-01-14 DIAGNOSIS — F902 Attention-deficit hyperactivity disorder, combined type: Secondary | ICD-10-CM | POA: Diagnosis not present

## 2021-01-14 DIAGNOSIS — F319 Bipolar disorder, unspecified: Secondary | ICD-10-CM | POA: Diagnosis not present

## 2021-01-14 MED ORDER — LAMOTRIGINE 200 MG PO TABS: 200.0000 mg | ORAL_TABLET | Freq: Every day | ORAL | 0 refills | Status: DC

## 2021-01-14 MED ORDER — CLONAZEPAM 0.5 MG PO TABS: 0.5000 mg | ORAL_TABLET | Freq: Three times a day (TID) | ORAL | 0 refills | Status: DC | PRN

## 2021-01-14 MED ORDER — LAMOTRIGINE 25 MG PO TABS: 75.0000 mg | ORAL_TABLET | Freq: Every day | ORAL | 0 refills | Status: DC

## 2021-01-14 MED ORDER — TRAZODONE HCL 150 MG PO TABS: 150.0000 mg | ORAL_TABLET | Freq: Every day | ORAL | 0 refills | Status: DC

## 2021-01-14 MED ORDER — ARIPIPRAZOLE 5 MG PO TABS: ORAL_TABLET | ORAL | 0 refills | Status: DC

## 2021-01-14 MED ORDER — CITALOPRAM HYDROBROMIDE 20 MG PO TABS: 20.0000 mg | ORAL_TABLET | Freq: Every day | ORAL | 0 refills | Status: DC

## 2021-01-14 MED ORDER — CLONIDINE HCL 0.1 MG PO TABS
0.1000 mg | ORAL_TABLET | Freq: Every day | ORAL | 0 refills | Status: DC
Start: 2021-01-14 — End: 2021-03-21

## 2021-01-14 NOTE — Progress Notes (Signed)
Virtual Visit via Telephone Note  I connected with Sarah Salas on 01/14/21 at  8:40 AM EDT by telephone and verified that I am speaking with the correct person using two identifiers.  Location: Patient: Home Provider: Home Office   I discussed the limitations, risks, security and privacy concerns of performing an evaluation and management service by telephone and the availability of in person appointments. I also discussed with the patient that there may be a patient responsible charge related to this service. The patient expressed understanding and agreed to proceed.   History of Present Illness: Patient is evaluated by phone session.  She is out of the medication for past few days because she did not pick up from the pharmacy and forgot to call our office.  She admitted very emotional, tearful, crying and easily distracted.  Her speech is rapid and pressured.  Her thought process circumstantial.  She admitted to struggle with anxiety, attention, focus.  She had stopped the Adderall because it was making her more anxious and she started picking up the skin.  She also endorsed her thoughts were jumping from one place to other place with the Adderall.  Since Adderall stopped her anxiety is somewhat better but now she is out of the Lamictal.  She has no rash, itching, tremors or shakes.  She is living with her close friend who she treat her as a sister.  She also had a boyfriend for more than a year and she feels the relationship is going well.  She denies any paranoia, hallucination, suicidal thoughts but admitted a lot of ups and downs in her mood.  Recently she had blood work and her cholesterol and LDLs are high.  She is trying to lose weight and she had lost 5 pounds since the last visit.  She admitted easily getting forgetful and sometime her mother and biological sister treat her as a child and she does not like it.  She is sleeping on and off.  She denies drinking or using any illegal  substances.  Past Psychiatric History: H/OADHD, bipolar disorder. Vyvanse did not help. Adderall helped.Latuda helpinthe beginning but then got worse with increased dose. Vistaril did not help. Noh/oparanoia, inpatient treatment or suicidal attempt.   Recent Results (from the past 2160 hour(s))  Cytology - PAP     Status: None   Collection Time: 11/25/20  8:36 AM  Result Value Ref Range   High risk HPV Negative    Adequacy      Satisfactory for evaluation; transformation zone component PRESENT.   Diagnosis      - Negative for intraepithelial lesion or malignancy (NILM)   Comment Normal Reference Range HPV - Negative   Cervicovaginal ancillary only     Status: None   Collection Time: 11/25/20  8:36 AM  Result Value Ref Range   Bacterial Vaginitis (gardnerella) Negative    Candida Vaginitis Negative    Candida Glabrata Negative    Trichomonas Negative    Chlamydia Negative    Neisseria Gonorrhea Negative    Comment      Normal Reference Range Bacterial Vaginosis - Negative   Comment Normal Reference Range Candida Species - Negative    Comment Normal Reference Range Candida Galbrata - Negative    Comment Normal Reference Range Trichomonas - Negative    Comment Normal Reference Ranger Chlamydia - Negative    Comment      Normal Reference Range Neisseria Gonorrhea - Negative  Hemoglobin A1c     Status: None  Collection Time: 11/25/20  9:19 AM  Result Value Ref Range   Hgb A1c MFr Bld 5.2 4.8 - 5.6 %    Comment:          Prediabetes: 5.7 - 6.4          Diabetes: >6.4          Glycemic control for adults with diabetes: <7.0    Est. average glucose Bld gHb Est-mCnc 103 mg/dL  CBC     Status: None   Collection Time: 11/25/20  9:19 AM  Result Value Ref Range   WBC 7.8 3.4 - 10.8 x10E3/uL   RBC 5.18 3.77 - 5.28 x10E6/uL   Hemoglobin 15.1 11.1 - 15.9 g/dL   Hematocrit 45.2 34.0 - 46.6 %   MCV 87 79 - 97 fL   MCH 29.2 26.6 - 33.0 pg   MCHC 33.4 31.5 - 35.7 g/dL    RDW 13.0 11.7 - 15.4 %   Platelets 314 150 - 450 x10E3/uL  CMP14+EGFR     Status: Abnormal   Collection Time: 11/25/20  9:19 AM  Result Value Ref Range   Glucose 97 65 - 99 mg/dL   BUN 10 6 - 24 mg/dL   Creatinine, Ser 0.83 0.57 - 1.00 mg/dL   GFR calc non Af Amer 87 >59 mL/min/1.73   GFR calc Af Amer 101 >59 mL/min/1.73    Comment: **In accordance with recommendations from the NKF-ASN Task force,**   [facility] is in the process of updating its eGFR calculation to the   2021 CKD-EPI creatinine equation that estimates kidney function   without a race variable.    BUN/Creatinine Ratio 12 9 - 23   Sodium 143 134 - 144 mmol/L   Potassium 3.7 3.5 - 5.2 mmol/L   Chloride 101 96 - 106 mmol/L   CO2 27 20 - 29 mmol/L   Calcium 8.8 8.7 - 10.2 mg/dL   Total Protein 6.2 6.0 - 8.5 g/dL   Albumin 4.4 3.8 - 4.8 g/dL   Globulin, Total 1.8 1.5 - 4.5 g/dL   Albumin/Globulin Ratio 2.4 (H) 1.2 - 2.2   Bilirubin Total <0.2 0.0 - 1.2 mg/dL   Alkaline Phosphatase 99 44 - 121 IU/L   AST 13 0 - 40 IU/L   ALT 15 0 - 32 IU/L  Lipid panel     Status: Abnormal   Collection Time: 11/25/20  9:19 AM  Result Value Ref Range   Cholesterol, Total 194 100 - 199 mg/dL   Triglycerides 162 (H) 0 - 149 mg/dL   HDL 39 (L) >39 mg/dL   VLDL Cholesterol Cal 29 5 - 40 mg/dL   LDL Chol Calc (NIH) 126 (H) 0 - 99 mg/dL   Chol/HDL Ratio 5.0 (H) 0.0 - 4.4 ratio    Comment:                                   T. Chol/HDL Ratio                                             Men  Women                               1/2 Avg.Risk  3.4  3.3                                   Avg.Risk  5.0    4.4                                2X Avg.Risk  9.6    7.1                                3X Avg.Risk 23.4   11.0   HCV Ab w Reflex to Quant PCR     Status: None   Collection Time: 11/25/20  9:19 AM  Result Value Ref Range   HCV Ab <0.1 0.0 - 0.9 s/co ratio  Interpretation:     Status: None   Collection Time: 11/25/20  9:19 AM  Result  Value Ref Range   HCV Interp 1: Comment     Comment: Negative Not infected with HCV, unless recent infection is suspected or other evidence exists to indicate HCV infection.     Psychiatric Specialty Exam: Physical Exam  Review of Systems  Weight 240 lb (108.9 kg).Body mass index is 34.44 kg/m.  General Appearance: NA  Eye Contact:  NA  Speech:  Pressured  Volume:  Increased  Mood:  Anxious and Irritable  Affect:  Labile  Thought Process:  Descriptions of Associations: Circumstantial  Orientation:  Full (Time, Place, and Person)  Thought Content:  Rumination  Suicidal Thoughts:  No  Homicidal Thoughts:  No  Memory:  Immediate;   Good Recent;   Fair Remote;   Fair  Judgement:  Fair  Insight:  Shallow  Psychomotor Activity:  Increased  Concentration:  Concentration: Fair and Attention Span: Fair  Recall:  [employer] of Knowledge:  Fair  Language:  Good  Akathisia:  No  Handed:  Right  AIMS (if indicated):     Assets:  Communication Skills Desire for Improvement Housing Social Support Transportation  ADL's:  Intact  Cognition:  WNL  Sleep:         Assessment and Plan: Bipolar disorder type I.  Anxiety.  ADD, combined type.  Patient is no longer taking Adderall because it was causing her increased anxiety.  She also off from the medication for past few days and spitting seeing worsening of the symptoms.  We discussed that she should call the office directly if she need refills.  She acknowledged and apologized and promised that she will call the office directly if she needs the refills.  We talked about trying a nonstimulant medicine to help her ADD symptoms.  After some discussion she agreed to give a try of clonidine.  I explained the clonidine can cause sedation and she can try in the morning but if it make her sleepy then she can take at bedtime.  I also recommend to try Lamictal 275 mg to help her mood symptoms.  So far she has no tremors rash or any itching.  I  reviewed blood work results.  Encourage watching her calorie intake.  She lost 3 pounds since the last visit.  She has another blood work coming up soon to check her lipid panel.  She states for 5 days with her friend and then weekend with her mother.  Patient is not working.  Continue Klonopin 0.5  mg 3 times a day, Celexa 20 mg daily, trazodone 150 mg at bedtime, Abilify 5 mg daily, lamotrigine 275 mg daily and we will try clonidine 0.1 mg daily.  Discussed in detail medication side effects and benefits.  Recommended to call us back if she is any question or any concern.  Follow-up in 4 weeks.  Follow Up Instructions:    I discussed the assessment and treatment plan with the patient. The patient was provided an opportunity to ask questions and all were answered. The patient agreed with the plan and demonstrated an understanding of the instructions.   The patient was advised to call back or seek an in-person evaluation if the symptoms worsen or if the condition fails to improve as anticipated.  I provided 27 minutes of non-face-to-face time during this encounter.   Kathlee Nations, MD

## 2021-01-20 ENCOUNTER — Other Ambulatory Visit: Payer: Self-pay

## 2021-01-20 ENCOUNTER — Ambulatory Visit
Admission: RE | Admit: 2021-01-20 | Discharge: 2021-01-20 | Disposition: A | Discharge status: Home / Self Care | Payer: Medicaid Other | Source: Ambulatory Visit | Attending: Nurse Practitioner | Admitting: Nurse Practitioner

## 2021-01-20 ENCOUNTER — Ambulatory Visit: Payer: Medicaid Other

## 2021-01-20 ENCOUNTER — Other Ambulatory Visit (HOSPITAL_COMMUNITY): Payer: Self-pay | Admitting: Psychiatry

## 2021-01-20 DIAGNOSIS — F419 Anxiety disorder, unspecified: Secondary | ICD-10-CM

## 2021-01-20 DIAGNOSIS — R922 Inconclusive mammogram: Secondary | ICD-10-CM | POA: Diagnosis not present

## 2021-01-20 DIAGNOSIS — R928 Other abnormal and inconclusive findings on diagnostic imaging of breast: Secondary | ICD-10-CM

## 2021-01-28 ENCOUNTER — Encounter: Payer: Self-pay | Admitting: Nurse Practitioner

## 2021-01-28 ENCOUNTER — Other Ambulatory Visit: Payer: Self-pay | Admitting: Nurse Practitioner

## 2021-01-28 MED ORDER — PX TUSSIN MAX 15 MG/5ML PO SYRP: 10.0000 mL | ORAL_SOLUTION | Freq: Four times a day (QID) | ORAL | 1 refills | Status: DC | PRN

## 2021-02-09 ENCOUNTER — Other Ambulatory Visit: Payer: Self-pay | Admitting: Nurse Practitioner

## 2021-02-09 ENCOUNTER — Encounter: Payer: Self-pay | Admitting: Nurse Practitioner

## 2021-02-09 MED ORDER — PX TUSSIN MAX 15 MG/5ML PO SYRP: 10.0000 mL | ORAL_SOLUTION | Freq: Four times a day (QID) | ORAL | 1 refills | Status: DC | PRN

## 2021-02-12 ENCOUNTER — Other Ambulatory Visit (HOSPITAL_COMMUNITY): Payer: Self-pay | Admitting: Psychiatry

## 2021-02-12 DIAGNOSIS — F902 Attention-deficit hyperactivity disorder, combined type: Secondary | ICD-10-CM

## 2021-02-14 ENCOUNTER — Other Ambulatory Visit: Payer: Self-pay

## 2021-02-14 ENCOUNTER — Telehealth (INDEPENDENT_AMBULATORY_CARE_PROVIDER_SITE_OTHER): Payer: Medicaid Other | Admitting: Psychiatry

## 2021-02-14 ENCOUNTER — Encounter (HOSPITAL_COMMUNITY): Payer: Self-pay | Admitting: Psychiatry

## 2021-02-14 VITALS — Wt 238.0 lb

## 2021-02-14 DIAGNOSIS — F419 Anxiety disorder, unspecified: Secondary | ICD-10-CM

## 2021-02-14 DIAGNOSIS — F902 Attention-deficit hyperactivity disorder, combined type: Secondary | ICD-10-CM | POA: Diagnosis not present

## 2021-02-14 DIAGNOSIS — F319 Bipolar disorder, unspecified: Secondary | ICD-10-CM

## 2021-02-14 MED ORDER — CITALOPRAM HYDROBROMIDE 20 MG PO TABS: 20.0000 mg | ORAL_TABLET | Freq: Every day | ORAL | 0 refills | Status: DC

## 2021-02-14 MED ORDER — LAMOTRIGINE 200 MG PO TABS: 200.0000 mg | ORAL_TABLET | Freq: Every day | ORAL | 0 refills | Status: DC

## 2021-02-14 MED ORDER — ARIPIPRAZOLE 5 MG PO TABS: ORAL_TABLET | ORAL | 0 refills | Status: DC

## 2021-02-14 MED ORDER — TRAZODONE HCL 150 MG PO TABS: 150.0000 mg | ORAL_TABLET | Freq: Every day | ORAL | 0 refills | Status: DC

## 2021-02-14 MED ORDER — CLONAZEPAM 0.5 MG PO TABS: 0.5000 mg | ORAL_TABLET | Freq: Three times a day (TID) | ORAL | 0 refills | Status: DC | PRN

## 2021-02-14 MED ORDER — LAMOTRIGINE 25 MG PO TABS: 75.0000 mg | ORAL_TABLET | Freq: Every day | ORAL | 0 refills | Status: DC

## 2021-02-14 MED ORDER — ATOMOXETINE HCL 25 MG PO CAPS: 25.0000 mg | ORAL_CAPSULE | Freq: Every day | ORAL | 0 refills | Status: DC

## 2021-02-14 NOTE — Progress Notes (Signed)
Virtual Visit via Telephone Note  I connected with Sarah Salas on 02/14/21 at  9:20 AM EDT by telephone and verified that I am speaking with the correct person using two identifiers.  Location: Patient: In Car Provider: Home Office   I discussed the limitations, risks, security and privacy concerns of performing an evaluation and management service by telephone and the availability of in person appointments. I also discussed with the patient that there may be a patient responsible charge related to this service. The patient expressed understanding and agreed to proceed.   History of Present Illness: Patient is evaluated by phone session.  We started her on clonidine 0.1 mg to help her ADD symptoms but did not show any improvement.  She continues to struggle with attention, focus and get easily distracted.  Her friend noticed that she cannot sit still and gets restless.  Patient admitted that sometimes she cannot stay on 1 task and jump to the next task.  Her thought process circumstantial.  We had tried Vyvanse, Adderall and recently clonidine but that did not help.  However her mood symptoms are better.  She denies any impulsive behavior, psychosis, hallucination or any suicidal thoughts.  She really liked trazodone which is helping her sleep.  She is trying to lose weight and she lost another 2 pounds since the last visit.  She states with her mother 5 days and on the weekend she stays with her close friend who she described as a sister.  She denies drinking or using any illegal substances.  She reported her relationship is going well with the boyfriend and on May 13 it will be 1 year.  Her long-term plan is to have her own place.  However she feels given her diagnosis it would be difficult as she gets easily distracted and sometimes not able to complete her task on time.  Past Psychiatric History: H/OADHD, bipolar disorder. Vyvanse did not help. Adderall helped.Latuda helpinthe beginning  but then got worse with increased dose. Vistaril did not help. Noh/oparanoia, inpatient treatment or suicidal attempt.   Psychiatric Specialty Exam: Physical Exam  Review of Systems  Weight 238 lb (108 kg).There is no height or weight on file to calculate BMI.  General Appearance: NA  Eye Contact:  NA  Speech:  fast  Volume:  Normal  Mood:  Irritable  Affect:  NA  Thought Process:  Descriptions of Associations: Circumstantial  Orientation:  Full (Time, Place, and Person)  Thought Content:  Rumination and distracted  Suicidal Thoughts:  No  Homicidal Thoughts:  No  Memory:  Immediate;   Good Recent;   Fair Remote;   Fair  Judgement:  Fair  Insight:  Shallow  Psychomotor Activity:  Increased  Concentration:  Concentration: distracted and Attention Span: Fair  Recall:  [employer] of Knowledge:  Fair  Language:  Fair  Akathisia:  No  Handed:  Right  AIMS (if indicated):     Assets:  Communication Skills Desire for Improvement Housing Social Support  ADL's:  Intact  Cognition:  WNL  Sleep:   good      Assessment and Plan: Bipolar disorder type I.  Anxiety.  ADHD, combined type.  Patient reported her mood symptoms are better but she still struggles with attention, focus and getting easily distracted.  She cannot even watch a movie because she get distracted so easily.  We tried clonidine that did not work.  In the past she had tried Vyvanse and Adderall that also cause increased  anxiety and nervousness.  I recommend try Strattera to see if that can help her ADHD symptoms.  She feels the increase Lamictal have helped her mood and she does not want to change other medication.  I will continue lamotrigine 275 mg, Abilify 5 mg daily, trazodone 150 mg at bedtime, Klonopin 0.5 mg to 3 times a day as needed, Celexa 20 mg daily and we will try Strattera 25 mg capsule in the morning.  Discussed medication side effects and benefits.  We will discontinue clonidine as it did not  help her.  Recommend to call us back if there is any question or any concern.  Follow-up in 4 weeks.  Follow Up Instructions:    I discussed the assessment and treatment plan with the patient. The patient was provided an opportunity to ask questions and all were answered. The patient agreed with the plan and demonstrated an understanding of the instructions.   The patient was advised to call back or seek an in-person evaluation if the symptoms worsen or if the condition fails to improve as anticipated.  I provided 17 minutes of non-face-to-face time during this encounter.   Cleotis Nipper, MD

## 2021-03-06 ENCOUNTER — Telehealth (INDEPENDENT_AMBULATORY_CARE_PROVIDER_SITE_OTHER): Payer: Medicaid Other | Admitting: Psychiatry

## 2021-03-06 ENCOUNTER — Other Ambulatory Visit: Payer: Self-pay

## 2021-03-06 ENCOUNTER — Encounter (HOSPITAL_COMMUNITY): Payer: Self-pay | Admitting: Psychiatry

## 2021-03-06 DIAGNOSIS — F419 Anxiety disorder, unspecified: Secondary | ICD-10-CM | POA: Diagnosis not present

## 2021-03-06 DIAGNOSIS — F319 Bipolar disorder, unspecified: Secondary | ICD-10-CM | POA: Diagnosis not present

## 2021-03-06 DIAGNOSIS — F902 Attention-deficit hyperactivity disorder, combined type: Secondary | ICD-10-CM | POA: Diagnosis not present

## 2021-03-06 MED ORDER — LAMOTRIGINE 100 MG PO TABS: 100.0000 mg | ORAL_TABLET | Freq: Every day | ORAL | 1 refills | Status: DC

## 2021-03-06 MED ORDER — CLONAZEPAM 0.5 MG PO TABS: 0.5000 mg | ORAL_TABLET | Freq: Three times a day (TID) | ORAL | 1 refills | Status: DC | PRN

## 2021-03-06 MED ORDER — ATOMOXETINE HCL 40 MG PO CAPS: 40.0000 mg | ORAL_CAPSULE | Freq: Every day | ORAL | 1 refills | Status: DC

## 2021-03-06 NOTE — Progress Notes (Signed)
Virtual Visit via Telephone Note  I connected with Sarah Salas on 03/06/21 at  3:00 PM EDT by telephone and verified that I am speaking with the correct person using two identifiers.  Location: Patient: Home Provider: Office   I discussed the limitations, risks, security and privacy concerns of performing an evaluation and management service by telephone and the availability of in person appointments. I also discussed with the patient that there may be a patient responsible charge related to this service. The patient expressed understanding and agreed to proceed.   History of Present Illness: Patient is evaluated by phone session.  On the last visit we started her on Strattera 25 mg.  She noticed improvement in her mood, attention, focus.  She does not feel very hyper and able to focus some time.  However she still feels dose is very low as struggle with staying focused for a long time.  She is anxious because her mother again admitted in the hospital and may need rehabilitation upon discharge.  She is now considering to staying with her sister in [place].  She feels her mood is getting slowly and gradually better but is still have highs and lows, impulsivity and mania.  She did better on Lamictal when we increase and now she is taking 275 mg daily.  She denies drinking or using any illegal substances.  Her relationship with her boyfriend is going okay.  Her long-term plan is to have her own place but now she may have to stay with the sister since father may need rehabilitation.  She denies any rash, itching, tremors or shakes.  Her sleep is better.  She denies any hallucination, suicidal thoughts.  She is trying to lose weight and she has lost 4 pounds since the last visit.   Past Psychiatric History: H/OADHD, bipolar disorder. Vyvanse did not help. Adderall helped.Latuda helpinthe beginning but then got worse with increased dose. Vistaril did not help. Noh/oparanoia, inpatient  treatment or suicidal attempt.  Psychiatric Specialty Exam: Physical Exam  Review of Systems  Weight 234 lb (106.1 kg).There is no height or weight on file to calculate BMI.  General Appearance: NA  Eye Contact:  NA  Speech:  Fast  Volume:  Normal  Mood:  Anxious and Irritable  Affect:  NA  Thought Process:  Descriptions of Associations: Circumstantial  Orientation:  Full (Time, Place, and Person)  Thought Content:  distracted  Suicidal Thoughts:  No  Homicidal Thoughts:  No  Memory:  Immediate;   Fair Recent;   Fair Remote;   Fair  Judgement:  Fair  Insight:  Shallow  Psychomotor Activity:  Increased  Concentration:  Concentration: Fair and Attention Span: Fair  Recall:  [employer] of Knowledge:  Fair  Language:  Fair  Akathisia:  No  Handed:  Right  AIMS (if indicated):     Assets:  Communication Skills Desire for Improvement Housing Social Support  ADL's:  Intact  Cognition:  WNL  Sleep:   ok      Assessment and Plan: Bipolar disorder type I.  Anxiety.  ADHD, combined type.  Patient shows marginal improvement with the Strattera.  She feels her anxiety did not increase but is still have some mood lability and residual symptoms of ADHD.  She like to try higher dose of Strattera.  We will try Strattera 40 mg daily and she like to increase Lamictal to 300 mg.  So far she is tolerating medicine reported no side effects.  We will continue  Abilify 5 mg daily, trazodone 150 mg at bedtime and Klonopin 0.5 mg 3 times a day.  Discussed medication side effects and benefits.  Encourage healthy lifestyle as patient trying to lose weight.  Recommended to call us back if she is any question or any concern.  Follow-up in 6 weeks.  Follow Up Instructions:    I discussed the assessment and treatment plan with the patient. The patient was provided an opportunity to ask questions and all were answered. The patient agreed with the plan and demonstrated an understanding of the  instructions.   The patient was advised to call back or seek an in-person evaluation if the symptoms worsen or if the condition fails to improve as anticipated.  I provided 18 minutes of non-face-to-face time during this encounter.   Cleotis Nipper, MD

## 2021-03-13 ENCOUNTER — Telehealth (HOSPITAL_COMMUNITY): Payer: Medicaid Other | Admitting: Psychiatry

## 2021-03-20 ENCOUNTER — Encounter: Payer: Self-pay | Admitting: Nurse Practitioner

## 2021-03-21 ENCOUNTER — Other Ambulatory Visit: Payer: Self-pay | Admitting: Nurse Practitioner

## 2021-03-21 ENCOUNTER — Other Ambulatory Visit (HOSPITAL_COMMUNITY): Payer: Self-pay | Admitting: *Deleted

## 2021-03-21 DIAGNOSIS — F419 Anxiety disorder, unspecified: Secondary | ICD-10-CM

## 2021-03-21 DIAGNOSIS — F319 Bipolar disorder, unspecified: Secondary | ICD-10-CM

## 2021-03-21 DIAGNOSIS — F902 Attention-deficit hyperactivity disorder, combined type: Secondary | ICD-10-CM

## 2021-03-21 MED ORDER — TRAZODONE HCL 150 MG PO TABS: 150.0000 mg | ORAL_TABLET | Freq: Every day | ORAL | 0 refills | Status: DC

## 2021-03-21 MED ORDER — LAMOTRIGINE 200 MG PO TABS: 200.0000 mg | ORAL_TABLET | Freq: Every day | ORAL | 0 refills | Status: DC

## 2021-03-21 MED ORDER — CLONIDINE HCL 0.1 MG PO TABS: 0.1000 mg | ORAL_TABLET | Freq: Every day | ORAL | 0 refills | Status: DC

## 2021-03-21 MED ORDER — PX TUSSIN MAX 15 MG/5ML PO SYRP: 10.0000 mL | ORAL_SOLUTION | Freq: Four times a day (QID) | ORAL | 1 refills | Status: DC | PRN

## 2021-03-21 MED ORDER — ARIPIPRAZOLE 5 MG PO TABS: ORAL_TABLET | ORAL | 0 refills | Status: DC

## 2021-03-21 MED ORDER — CITALOPRAM HYDROBROMIDE 20 MG PO TABS: 20.0000 mg | ORAL_TABLET | Freq: Every day | ORAL | 0 refills | Status: DC

## 2021-03-28 ENCOUNTER — Other Ambulatory Visit: Payer: Self-pay | Admitting: Nurse Practitioner

## 2021-03-28 ENCOUNTER — Other Ambulatory Visit (HOSPITAL_COMMUNITY): Payer: Self-pay | Admitting: Psychiatry

## 2021-03-28 DIAGNOSIS — F902 Attention-deficit hyperactivity disorder, combined type: Secondary | ICD-10-CM

## 2021-03-28 DIAGNOSIS — F419 Anxiety disorder, unspecified: Secondary | ICD-10-CM

## 2021-03-28 DIAGNOSIS — F319 Bipolar disorder, unspecified: Secondary | ICD-10-CM

## 2021-03-28 MED ORDER — CETIRIZINE HCL 5 MG PO TABS: 10.0000 mg | ORAL_TABLET | Freq: Every day | ORAL | 1 refills | Status: DC

## 2021-03-28 MED ORDER — PX TUSSIN MAX 15 MG/5ML PO SYRP: 10.0000 mL | ORAL_SOLUTION | Freq: Four times a day (QID) | ORAL | 1 refills | Status: DC | PRN

## 2021-04-01 ENCOUNTER — Encounter (HOSPITAL_COMMUNITY): Payer: Self-pay | Admitting: Psychiatry

## 2021-04-01 ENCOUNTER — Telehealth (INDEPENDENT_AMBULATORY_CARE_PROVIDER_SITE_OTHER): Payer: Medicaid Other | Admitting: Psychiatry

## 2021-04-01 ENCOUNTER — Other Ambulatory Visit: Payer: Self-pay

## 2021-04-01 DIAGNOSIS — F419 Anxiety disorder, unspecified: Secondary | ICD-10-CM | POA: Diagnosis not present

## 2021-04-01 DIAGNOSIS — F902 Attention-deficit hyperactivity disorder, combined type: Secondary | ICD-10-CM | POA: Diagnosis not present

## 2021-04-01 DIAGNOSIS — F319 Bipolar disorder, unspecified: Secondary | ICD-10-CM | POA: Diagnosis not present

## 2021-04-01 MED ORDER — CLONAZEPAM 0.5 MG PO TABS: 0.5000 mg | ORAL_TABLET | Freq: Three times a day (TID) | ORAL | 1 refills | Status: DC | PRN

## 2021-04-01 MED ORDER — TRAZODONE HCL 150 MG PO TABS: 150.0000 mg | ORAL_TABLET | Freq: Every day | ORAL | 1 refills | Status: DC

## 2021-04-01 MED ORDER — CITALOPRAM HYDROBROMIDE 20 MG PO TABS: 20.0000 mg | ORAL_TABLET | Freq: Every day | ORAL | 1 refills | Status: DC

## 2021-04-01 MED ORDER — ARIPIPRAZOLE 5 MG PO TABS: ORAL_TABLET | ORAL | 1 refills | Status: DC

## 2021-04-01 MED ORDER — LAMOTRIGINE 150 MG PO TABS: 150.0000 mg | ORAL_TABLET | Freq: Two times a day (BID) | ORAL | 1 refills | Status: DC

## 2021-04-01 MED ORDER — ATOMOXETINE HCL 40 MG PO CAPS: 40.0000 mg | ORAL_CAPSULE | Freq: Every day | ORAL | 1 refills | Status: DC

## 2021-04-01 NOTE — Progress Notes (Signed)
Virtual Visit via Telephone Note  I connected with Sarah Salas on 04/01/21 at 10:20 AM EDT by telephone and verified that I am speaking with the correct person using two identifiers.  Location: Patient: Home Provider: Home Office   I discussed the limitations, risks, security and privacy concerns of performing an evaluation and management service by telephone and the availability of in person appointments. I also discussed with the patient that there may be a patient responsible charge related to this service. The patient expressed understanding and agreed to proceed.   History of Present Illness: Patient is evaluated by phone session.  She admitted feeling overwhelmed and burnout because her mother came from nursing home and requires a lot of assistance.  Recently mother and a stepfather developed COVID and that increases the stress level because she has to take a lot of precautions going to be her room.  She has not able to see her boyfriend and that upsets her a lot.  However overall she feels the medicine is working and she is able to do multitasking.  She still have mood lability and sometime crying spells because she gets overwhelmed however able to do her job at times.  She like to move out once her mother get better.  She has highs and lows but they are not as bad.  She is able to sleep good with the help of trazodone.  She is trying to focus on her health and watching her calorie intake.  She lost few pounds since the last visit.  She denies any paranoia, hallucination, suicidal thoughts.  She admitted feeling anxious and nervous but Klonopin helps her a lot.  We had increased the Lamictal and Strattera that helped her mood lability.  She has no rash, itching tremors or shakes.   Past Psychiatric History:  H/O ADHD, bipolar disorder.  Vyvanse did not help.  Adderall helped. Latuda help in the beginning but then got worse with increased dose.  Vistaril did not help.  No h/o paranoia,  inpatient treatment or suicidal attempt.      Psychiatric Specialty Exam: Physical Exam  Review of Systems  Weight 229 lb (103.9 kg).There is no height or weight on file to calculate BMI.  General Appearance: NA  Eye Contact:  NA  Speech:   fast  Volume:  Normal  Mood:  Anxious, Irritable, and emotional  Affect:  NA  Thought Process:  Descriptions of Associations: Circumstantial  Orientation:  Full (Time, Place, and Person)  Thought Content:  Rumination and distracted  Suicidal Thoughts:  No  Homicidal Thoughts:  No  Memory:  Immediate;   Fair Recent;   Fair Remote;   Fair  Judgement:  Fair  Insight:  Shallow  Psychomotor Activity:  NA  Concentration:  Concentration: Fair and Attention Span: Fair  Recall:  [employer] of Knowledge:  Fair  Language:  Fair  Akathisia:  No  Handed:  Right  AIMS (if indicated):     Assets:  Communication Skills Desire for Improvement Housing Resilience Social Support  ADL's:  Intact  Cognition:  WNL  Sleep:         Assessment and Plan: Bipolar disorder type I.  Anxiety.  ADHD, combined type.  Patient shows improvement from the medication but also overwhelmed because mother came back from rehab and recently had COVID.  She is trying to help mother, stepfather who also had COVID and feel burnout.  But patient had a good support from her sister and boyfriend.  Patient does not want to change the medication since she feeling they are working.  I will continue trazodone 150 mg at bedtime, Klonopin 0.5 mg 3 times a day, Abilify 5 mg daily, Lamictal 300 mg daily, Strattera 40 mg daily and Celexa 20 mg daily.  Discussed polypharmacy but patient afraid to cut down the medication.  Recommended to call us back if she has any question of any concern.  Follow-up in 6 weeks.  Follow Up Instructions:    I discussed the assessment and treatment plan with the patient. The patient was provided an opportunity to ask questions and all were answered. The  patient agreed with the plan and demonstrated an understanding of the instructions.   The patient was advised to call back or seek an in-person evaluation if the symptoms worsen or if the condition fails to improve as anticipated.  I provided 16 minutes of non-face-to-face time during this encounter.   Cleotis Nipper, MD

## 2021-04-22 ENCOUNTER — Other Ambulatory Visit (HOSPITAL_COMMUNITY): Payer: Self-pay | Admitting: Psychiatry

## 2021-04-22 DIAGNOSIS — F419 Anxiety disorder, unspecified: Secondary | ICD-10-CM

## 2021-04-27 ENCOUNTER — Other Ambulatory Visit (HOSPITAL_COMMUNITY): Payer: Self-pay | Admitting: Psychiatry

## 2021-04-27 DIAGNOSIS — F902 Attention-deficit hyperactivity disorder, combined type: Secondary | ICD-10-CM

## 2021-05-09 ENCOUNTER — Other Ambulatory Visit: Payer: Self-pay | Admitting: Nurse Practitioner

## 2021-05-10 ENCOUNTER — Encounter: Payer: Self-pay | Admitting: Nurse Practitioner

## 2021-05-12 NOTE — Telephone Encounter (Signed)
Pls schedule for a 810 or 130 phone visit. Thanks!

## 2021-05-16 ENCOUNTER — Telehealth: Payer: Medicaid Other | Admitting: Nurse Practitioner

## 2021-05-20 ENCOUNTER — Telehealth (INDEPENDENT_AMBULATORY_CARE_PROVIDER_SITE_OTHER): Payer: Medicaid Other | Admitting: Psychiatry

## 2021-05-20 ENCOUNTER — Telehealth (HOSPITAL_COMMUNITY): Payer: Self-pay | Admitting: *Deleted

## 2021-05-20 ENCOUNTER — Encounter (HOSPITAL_COMMUNITY): Payer: Self-pay | Admitting: Psychiatry

## 2021-05-20 ENCOUNTER — Other Ambulatory Visit: Payer: Self-pay

## 2021-05-20 DIAGNOSIS — F419 Anxiety disorder, unspecified: Secondary | ICD-10-CM

## 2021-05-20 DIAGNOSIS — F319 Bipolar disorder, unspecified: Secondary | ICD-10-CM | POA: Diagnosis not present

## 2021-05-20 DIAGNOSIS — F902 Attention-deficit hyperactivity disorder, combined type: Secondary | ICD-10-CM | POA: Diagnosis not present

## 2021-05-20 MED ORDER — ARIPIPRAZOLE 5 MG PO TABS: ORAL_TABLET | ORAL | 1 refills | Status: DC

## 2021-05-20 MED ORDER — CLONAZEPAM 0.5 MG PO TABS: 0.5000 mg | ORAL_TABLET | Freq: Three times a day (TID) | ORAL | 1 refills | Status: DC | PRN

## 2021-05-20 MED ORDER — ATOMOXETINE HCL 40 MG PO CAPS: 40.0000 mg | ORAL_CAPSULE | Freq: Every day | ORAL | 1 refills | Status: DC

## 2021-05-20 MED ORDER — LAMOTRIGINE 150 MG PO TABS: 150.0000 mg | ORAL_TABLET | Freq: Two times a day (BID) | ORAL | 1 refills | Status: DC

## 2021-05-20 MED ORDER — CITALOPRAM HYDROBROMIDE 20 MG PO TABS: 20.0000 mg | ORAL_TABLET | Freq: Every day | ORAL | 1 refills | Status: DC

## 2021-05-20 MED ORDER — TRAZODONE HCL 150 MG PO TABS: 150.0000 mg | ORAL_TABLET | Freq: Every day | ORAL | 1 refills | Status: DC

## 2021-05-20 NOTE — Telephone Encounter (Signed)
PA for Abilify 5 mg tabs approved by [employer] from 05/20/21 thru 05/20/22.

## 2021-05-20 NOTE — Telephone Encounter (Signed)
PA request # [id number] for Abilify  5 mg tabs.

## 2021-05-20 NOTE — Telephone Encounter (Signed)
PA for Abilify 5mg  tablet submitted via CoverMyMeds. PA case # . Awaiting determination.

## 2021-05-20 NOTE — Progress Notes (Signed)
Virtual Visit via Telephone Note  I connected with Gomez Cleverly on 05/20/21 at  9:00 AM EDT by telephone and verified that I am speaking with the correct person using two identifiers.  Location: Patient: Home Provider: Home Office   I discussed the limitations, risks, security and privacy concerns of performing an evaluation and management service by telephone and the availability of in person appointments. I also discussed with the patient that there may be a patient responsible charge related to this service. The patient expressed understanding and agreed to proceed.   History of Present Illness: Patient is evaluated by phone session.  She is feeling much better since everyone recovered from the COVID and mother is doing much better and have strength back.  She does not require as much time and able to take care of herself.  Patient also feels that her relationship with the boyfriend is getting more stronger and very supportive.  She is sleeping better with the help of trazodone.  She is still having physical symptoms of ADHD as getting easily distracted but denies any highs and lows, mania, impulsive behavior.  She start walking every day and she had app that tells her about her status and she follows.  She is still staying at her mother's house but usually go at her sister's house 3 times a week.  Patient has no rash, itching tremors or shakes.  She denies any panic attack.   Past Psychiatric History:  H/O ADHD, bipolar disorder.  Vyvanse did not help.  Adderall helped. Latuda help in the beginning but then got worse with increased dose.  Vistaril did not help.  No h/o paranoia, inpatient treatment or suicidal attempt.      Psychiatric Specialty Exam: Physical Exam  Review of Systems  Weight 227 lb (103 kg).There is no height or weight on file to calculate BMI.  General Appearance: NA  Eye Contact:  NA  Speech:   fast  Volume:  Normal  Mood:  Anxious  Affect:  NA  Thought Process:   Descriptions of Associations: Circumstantial  Orientation:  Full (Time, Place, and Person)  Thought Content:   distracted  Suicidal Thoughts:  No  Homicidal Thoughts:  No  Memory:  Immediate;   Fair Recent;   Fair Remote;   Fair  Judgement:  Fair  Insight:  Shallow  Psychomotor Activity:  NA  Concentration:  Concentration: Fair and Attention Span: Fair  Recall:  [employer] of Knowledge:  Fair  Language:  Fair  Akathisia:  No  Handed:  Right  AIMS (if indicated):     Assets:  Communication Skills Desire for Improvement Housing Social Support  ADL's:  Intact  Cognition:  WNL  Sleep:   better      Assessment and Plan: Bipolar disorder type I.  Anxiety.  ADHD, combined type.  Patient doing better on her medication.  We discussed polypharmacy but patient reluctant to cut down the medication as she feels it is helping but promised to reconsider on the next appointment if patient continues to do better.  She had a good support from her sister and boyfriend.  Continue Klonopin 0.5 mg 3 times a day, Abilify 5 mg daily, lamotrigine 300 mg daily, Strattera 40 mg daily, Celexa 20 mg daily and trazodone 150 mg at bedtime.  Recommended to call us back if she has any question or any concern.  Follow-up in 2 months.  Follow Up Instructions:    I discussed the assessment and treatment plan  with the patient. The patient was provided an opportunity to ask questions and all were answered. The patient agreed with the plan and demonstrated an understanding of the instructions.   The patient was advised to call back or seek an in-person evaluation if the symptoms worsen or if the condition fails to improve as anticipated.  I provided 17 minutes of non-face-to-face time during this encounter.   Cleotis Nipper, MD

## 2021-07-17 ENCOUNTER — Telehealth (HOSPITAL_BASED_OUTPATIENT_CLINIC_OR_DEPARTMENT_OTHER): Payer: Medicaid Other | Admitting: Psychiatry

## 2021-07-17 ENCOUNTER — Other Ambulatory Visit: Payer: Self-pay

## 2021-07-17 ENCOUNTER — Encounter (HOSPITAL_COMMUNITY): Payer: Self-pay | Admitting: Psychiatry

## 2021-07-17 DIAGNOSIS — F902 Attention-deficit hyperactivity disorder, combined type: Secondary | ICD-10-CM | POA: Diagnosis not present

## 2021-07-17 DIAGNOSIS — F319 Bipolar disorder, unspecified: Secondary | ICD-10-CM

## 2021-07-17 DIAGNOSIS — F419 Anxiety disorder, unspecified: Secondary | ICD-10-CM

## 2021-07-17 MED ORDER — ATOMOXETINE HCL 40 MG PO CAPS: 40.0000 mg | ORAL_CAPSULE | Freq: Every day | ORAL | 1 refills | Status: DC

## 2021-07-17 MED ORDER — TRAZODONE HCL 150 MG PO TABS: 150.0000 mg | ORAL_TABLET | Freq: Every day | ORAL | 1 refills | Status: DC

## 2021-07-17 MED ORDER — ARIPIPRAZOLE 5 MG PO TABS: ORAL_TABLET | ORAL | 1 refills | Status: DC

## 2021-07-17 MED ORDER — CITALOPRAM HYDROBROMIDE 20 MG PO TABS: 20.0000 mg | ORAL_TABLET | Freq: Every day | ORAL | 1 refills | Status: DC

## 2021-07-17 MED ORDER — LAMOTRIGINE 150 MG PO TABS: 150.0000 mg | ORAL_TABLET | Freq: Every day | ORAL | 1 refills | Status: DC

## 2021-07-17 MED ORDER — CLONAZEPAM 0.5 MG PO TABS: 0.5000 mg | ORAL_TABLET | Freq: Three times a day (TID) | ORAL | 1 refills | Status: DC | PRN

## 2021-07-17 NOTE — Progress Notes (Signed)
Virtual Visit via Telephone Note  I connected with Sarah Salas on 07/17/21 at  9:00 AM EDT by telephone and verified that I am speaking with the correct person using two identifiers.  Location: Patient: Home Provider: Home Office   I discussed the limitations, risks, security and privacy concerns of performing an evaluation and management service by telephone and the availability of in person appointments. I also discussed with the patient that there may be a patient responsible charge related to this service. The patient expressed understanding and agreed to proceed.   History of Present Illness: Patient is evaluated by phone session.  She is feeling better since moved to her sister's house in [place].  Patient told she is not taking care of her mother as she can help herself with the help of stepfather.  Since separated from her mother she is less stressed and doing much better.  She reported an excellent relationship with her sister [name] who is also helping her to cope better.  She is not spending more time with yard work.  She quit smoking but her weight remains stable.  She had a stronger relationship with her boyfriend who lives close by.  She denies any mania, psychosis, hallucination.  She tried to cut down her Klonopin but her anxiety got worse and she is back on Klonopin 3 times a day.  We talked about polypharmacy as patient doing better and much stable.  Recommended to cut down the Lamictal as patient already taking the Abilify and if needed then we can adjust Abilify dose so she can minimize the pills.  Patient agreed with the plan.  She denies any suicidal thoughts or homicidal thoughts.  Her attention focus remains distracted but able to function with the help of Strattera.  She has no tremor or shakes or any EPS.  She denies any panic attack.   Past Psychiatric History:  H/O ADHD, bipolar disorder.  Vyvanse did not help.  Adderall helped. Latuda help in the beginning but then  got worse with increased dose.  Vistaril did not help.  No h/o paranoia, inpatient treatment or suicidal attempt.      Psychiatric Specialty Exam: Physical Exam  Review of Systems  Weight 228 lb (103.4 kg).There is no height or weight on file to calculate BMI.  General Appearance: NA  Eye Contact:  NA  Speech:   fast  Volume:  Normal  Mood:  Anxious  Affect:  NA  Thought Process:  Descriptions of Associations: Intact  Orientation:  Full (Time, Place, and Person)  Thought Content:  Rumination  Suicidal Thoughts:  No  Homicidal Thoughts:  No  Memory:  Immediate;   Fair Recent;   Fair Remote;   Fair  Judgement:  Fair  Insight:  Present  Psychomotor Activity:  NA  Concentration:  Concentration: Fair and Attention Span: Fair  Recall:  [employer] of Knowledge:  Fair  Language:  Good  Akathisia:  No  Handed:  Right  AIMS (if indicated):     Assets:  Communication Skills Desire for Improvement Housing Resilience Social Support  ADL's:  Intact  Cognition:  WNL  Sleep:   ok      Assessment and Plan: Bipolar disorder type I.  Anxiety.  ADHD, combined type.  Patient doing much better since she moved out from her mother's house and living with her sister who is very helpful.  Again we discussed reducing the medication as patient taking multiple medication.  She tried Klonopin reduced but  her anxiety got worse.  I recommend to try Lamictal cut down to take only 150 mg daytime.  I also reminded her if her symptoms started to come back then we may consider increasing the dose of the Abilify.  She agreed with the plan.  For now we will continue Abilify 5 mg daily, Strattera 40 mg daily, Celexa 20 mg daily, trazodone 50 mg at bedtime, Klonopin 0.5 mg 3 times a day and she will try Lamictal 150 mg only during the day.  I recommended to call us back if she is any question or any concern.  Follow-up in 2 months.  Follow Up Instructions:    I discussed the assessment and treatment plan  with the patient. The patient was provided an opportunity to ask questions and all were answered. The patient agreed with the plan and demonstrated an understanding of the instructions.   The patient was advised to call back or seek an in-person evaluation if the symptoms worsen or if the condition fails to improve as anticipated.  I provided 19 minutes of non-face-to-face time during this encounter.   Cleotis Nipper, MD

## 2021-08-04 DIAGNOSIS — D485 Neoplasm of uncertain behavior of skin: Secondary | ICD-10-CM | POA: Diagnosis not present

## 2021-08-04 DIAGNOSIS — D225 Melanocytic nevi of trunk: Secondary | ICD-10-CM | POA: Diagnosis not present

## 2021-08-04 DIAGNOSIS — L578 Other skin changes due to chronic exposure to nonionizing radiation: Secondary | ICD-10-CM | POA: Diagnosis not present

## 2021-08-04 DIAGNOSIS — L814 Other melanin hyperpigmentation: Secondary | ICD-10-CM | POA: Diagnosis not present

## 2021-08-04 DIAGNOSIS — D235 Other benign neoplasm of skin of trunk: Secondary | ICD-10-CM | POA: Diagnosis not present

## 2021-08-22 ENCOUNTER — Other Ambulatory Visit (HOSPITAL_COMMUNITY): Payer: Self-pay | Admitting: Psychiatry

## 2021-08-22 DIAGNOSIS — F319 Bipolar disorder, unspecified: Secondary | ICD-10-CM

## 2021-09-10 ENCOUNTER — Telehealth (HOSPITAL_COMMUNITY): Payer: Self-pay | Admitting: *Deleted

## 2021-09-10 ENCOUNTER — Other Ambulatory Visit (HOSPITAL_COMMUNITY): Payer: Self-pay | Admitting: Psychiatry

## 2021-09-10 DIAGNOSIS — F319 Bipolar disorder, unspecified: Secondary | ICD-10-CM

## 2021-09-10 NOTE — Telephone Encounter (Signed)
Pt called crying and whimpering regarding needing all her medications, including Klonopin. At times seeming infantile then pt speech pressured and rapid.Mood very labile. This nurse spoke with PhD at her [facility] pharmacy who confirmed that pt had refills on all meds except the Klonopin and Strattera. Klonopin was filled on 08/19/21. Pt started laughing, then crying stating "oh no I'm good on the Klonopin". FYI. Pt has an upcoming appointment on 09/15/21.

## 2021-09-10 NOTE — Telephone Encounter (Signed)
Patient's sister is very involved in the treatment plan and she is very close to her sister.  Can you call her sister to clarify if she is decompensating and taking her medication as prescribed.

## 2021-09-15 ENCOUNTER — Other Ambulatory Visit: Payer: Self-pay

## 2021-09-15 ENCOUNTER — Telehealth (HOSPITAL_BASED_OUTPATIENT_CLINIC_OR_DEPARTMENT_OTHER): Payer: Medicaid Other | Admitting: Psychiatry

## 2021-09-15 ENCOUNTER — Encounter (HOSPITAL_COMMUNITY): Payer: Self-pay | Admitting: Psychiatry

## 2021-09-15 DIAGNOSIS — F902 Attention-deficit hyperactivity disorder, combined type: Secondary | ICD-10-CM | POA: Diagnosis not present

## 2021-09-15 DIAGNOSIS — F319 Bipolar disorder, unspecified: Secondary | ICD-10-CM

## 2021-09-15 DIAGNOSIS — F419 Anxiety disorder, unspecified: Secondary | ICD-10-CM | POA: Diagnosis not present

## 2021-09-15 MED ORDER — ARIPIPRAZOLE 5 MG PO TABS: ORAL_TABLET | ORAL | 0 refills | Status: DC

## 2021-09-15 MED ORDER — TRAZODONE HCL 150 MG PO TABS
150.0000 mg | ORAL_TABLET | Freq: Every day | ORAL | 0 refills | Status: DC
Start: 2021-09-15 — End: 2021-12-09

## 2021-09-15 MED ORDER — LAMOTRIGINE 150 MG PO TABS: 150.0000 mg | ORAL_TABLET | Freq: Every day | ORAL | 0 refills | Status: DC

## 2021-09-15 MED ORDER — CLONAZEPAM 0.5 MG PO TABS: 0.5000 mg | ORAL_TABLET | Freq: Three times a day (TID) | ORAL | 2 refills | Status: DC | PRN

## 2021-09-15 MED ORDER — CITALOPRAM HYDROBROMIDE 20 MG PO TABS: 20.0000 mg | ORAL_TABLET | Freq: Every day | ORAL | 0 refills | Status: DC

## 2021-09-15 MED ORDER — ATOMOXETINE HCL 40 MG PO CAPS: 40.0000 mg | ORAL_CAPSULE | Freq: Every day | ORAL | 0 refills | Status: DC

## 2021-09-15 NOTE — Progress Notes (Signed)
Virtual Visit via Telephone Note  I connected with Gomez Cleverly on 09/15/21 at  9:00 AM EST by telephone and verified that I am speaking with the correct person using two identifiers.  Location: Patient: [facility] Provider: Home Office   I discussed the limitations, risks, security and privacy concerns of performing an evaluation and management service by telephone and the availability of in person appointments. I also discussed with the patient that there may be a patient responsible charge related to this service. The patient expressed understanding and agreed to proceed.   History of Present Illness: Patient is evaluated by phone session.  She reported things are going very well and she noticed her mood irritability and mania is much better with the current medication.  She is out of the trazodone for past few days and now taking over-the-counter sleep medicine from the pharmacy which do not work as good.  She is not sure why she is out of as she should have the refill until her appointment.  Patient feels a current medicine helping her mood and she noticed much stronger relationship with her sister, boyfriend and surprisingly she noticed that her mother noticed much improvement in her behavior.  She is staying with her sister but now she tried to go on the weekdays to stay with the mother because her mother wants her.  Patient also started walking every day and lost 10 pounds since the last visit.  On the last visit we have decreased the Lamictal and increase Abilify.  She has no rash, itching tremors or shakes.  She does not want to change or cut down the current medication as she noticed improvement in her mood, crying spells, sleep and anger.  She noticed her attention concentration is much better with Strattera.  She has no tremor or shakes or any EPS.  She denies any panic attack.  She is trying to use her coping skills when she gets stressed out but lately she noticed improvement with her  behavior especially related to her mother.  She has a plan to spend time with her boyfriend's family on Christmas.  Past Psychiatric History:  H/O ADHD, bipolar disorder.  Vyvanse did not help.  Adderall helped. Latuda help in the beginning but then got worse with increased dose.  Vistaril did not help.  No h/o paranoia, inpatient treatment or suicidal attempt.       Psychiatric Specialty Exam: Physical Exam  Review of Systems  Weight 220 lb (99.8 kg).There is no height or weight on file to calculate BMI.  General Appearance: NA  Eye Contact:  NA  Speech:   fast  Volume:  Increased  Mood:  Anxious and Euphoric  Affect:  NA  Thought Process:  Descriptions of Associations: Intact  Orientation:  Full (Time, Place, and Person)  Thought Content:  Rumination  Suicidal Thoughts:  No  Homicidal Thoughts:  No  Memory:  Immediate;   Fair Recent;   Fair Remote;   Fair  Judgement:  Fair  Insight:  Present  Psychomotor Activity:  NA  Concentration:  Concentration: Fair and Attention Span: Fair  Recall:  [employer] of Knowledge:  Fair  Language:  Good  Akathisia:  No  Handed:  Right  AIMS (if indicated):     Assets:  Communication Skills Desire for Improvement Housing Social Support  ADL's:  Intact  Cognition:  WNL  Sleep:   better with Trazone      Assessment and Plan: Bipolar disorder type I.  Anxiety.  ADHD, combined type.  Patient doing better with the increased dose of Abilify.  She is very reluctant to cut down her medication or changing the dose since she noticed improvement in her mood, behavior and anxiety.  She started going on the weekdays to visit her mother and if possible stays few days but at least she knows if things do not work out she cannot go back to her sister [name].  Discussed medication side effects and benefits.  Continue Abilify 5 mg daily, Strattera 40 mg daily, Celexa 20 mg daily, Klonopin 0.5 mg 3 times a day, lamotrigine 150 mg daily and trazodone  150 mg at nighttime.  I encouraged she should count the pills before she leaves the pharmacy.  Recommended to call us back if she has any question or any concern.  Follow-up in 3 months.  We may consider lowering the dose of the medication when she is more stable to avoid polypharmacy.  Follow Up Instructions:    I discussed the assessment and treatment plan with the patient. The patient was provided an opportunity to ask questions and all were answered. The patient agreed with the plan and demonstrated an understanding of the instructions.   The patient was advised to call back or seek an in-person evaluation if the symptoms worsen or if the condition fails to improve as anticipated.  I provided 21 minutes of non-face-to-face time during this encounter.   Cleotis Nipper, MD

## 2021-10-06 ENCOUNTER — Encounter: Payer: Self-pay | Admitting: Nurse Practitioner

## 2021-10-08 ENCOUNTER — Encounter: Payer: Self-pay | Admitting: Nurse Practitioner

## 2021-10-20 ENCOUNTER — Other Ambulatory Visit: Payer: Self-pay | Admitting: Nurse Practitioner

## 2021-10-20 MED ORDER — MELOXICAM 15 MG PO TABS: 15.0000 mg | ORAL_TABLET | Freq: Every day | ORAL | 1 refills | Status: DC

## 2021-10-28 ENCOUNTER — Other Ambulatory Visit: Payer: Self-pay | Admitting: Nurse Practitioner

## 2021-10-28 DIAGNOSIS — Z1231 Encounter for screening mammogram for malignant neoplasm of breast: Secondary | ICD-10-CM

## 2021-11-08 ENCOUNTER — Other Ambulatory Visit: Payer: Self-pay | Admitting: Nurse Practitioner

## 2021-11-08 DIAGNOSIS — M25561 Pain in right knee: Secondary | ICD-10-CM

## 2021-11-08 DIAGNOSIS — G8929 Other chronic pain: Secondary | ICD-10-CM

## 2021-11-10 ENCOUNTER — Ambulatory Visit (HOSPITAL_COMMUNITY)
Admission: RE | Admit: 2021-11-10 | Discharge: 2021-11-10 | Disposition: A | Discharge status: Home / Self Care | Payer: Medicaid Other | Source: Ambulatory Visit | Attending: Nurse Practitioner | Admitting: Nurse Practitioner

## 2021-11-10 ENCOUNTER — Other Ambulatory Visit: Payer: Self-pay

## 2021-11-10 DIAGNOSIS — G8929 Other chronic pain: Secondary | ICD-10-CM | POA: Insufficient documentation

## 2021-11-10 DIAGNOSIS — M25561 Pain in right knee: Secondary | ICD-10-CM | POA: Insufficient documentation

## 2021-11-14 ENCOUNTER — Other Ambulatory Visit: Payer: Self-pay | Admitting: Nurse Practitioner

## 2021-11-14 DIAGNOSIS — G8929 Other chronic pain: Secondary | ICD-10-CM

## 2021-11-19 ENCOUNTER — Other Ambulatory Visit: Payer: Self-pay

## 2021-11-19 ENCOUNTER — Ambulatory Visit (HOSPITAL_BASED_OUTPATIENT_CLINIC_OR_DEPARTMENT_OTHER): Payer: Medicaid Other | Admitting: Orthopaedic Surgery

## 2021-11-19 DIAGNOSIS — M25561 Pain in right knee: Secondary | ICD-10-CM

## 2021-11-19 DIAGNOSIS — M12061 Chronic postrheumatic arthropathy [Jaccoud], right knee: Secondary | ICD-10-CM
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
## 2021-11-24 ENCOUNTER — Encounter: Payer: Medicaid Other | Admitting: Nurse Practitioner

## 2021-12-09 ENCOUNTER — Encounter (HOSPITAL_COMMUNITY): Payer: Self-pay | Admitting: Psychiatry

## 2021-12-09 ENCOUNTER — Other Ambulatory Visit: Payer: Self-pay

## 2021-12-09 ENCOUNTER — Telehealth (HOSPITAL_BASED_OUTPATIENT_CLINIC_OR_DEPARTMENT_OTHER): Payer: Medicaid Other | Admitting: Psychiatry

## 2021-12-09 DIAGNOSIS — F319 Bipolar disorder, unspecified: Secondary | ICD-10-CM | POA: Diagnosis not present

## 2021-12-09 DIAGNOSIS — F902 Attention-deficit hyperactivity disorder, combined type: Secondary | ICD-10-CM

## 2021-12-09 DIAGNOSIS — F419 Anxiety disorder, unspecified: Secondary | ICD-10-CM

## 2021-12-09 MED ORDER — ATOMOXETINE HCL 40 MG PO CAPS: 40.0000 mg | ORAL_CAPSULE | Freq: Every day | ORAL | 0 refills | Status: DC

## 2021-12-09 MED ORDER — CITALOPRAM HYDROBROMIDE 20 MG PO TABS: 20.0000 mg | ORAL_TABLET | Freq: Every day | ORAL | 0 refills | Status: DC

## 2021-12-09 MED ORDER — ARIPIPRAZOLE 5 MG PO TABS: ORAL_TABLET | ORAL | 0 refills | Status: DC

## 2021-12-09 MED ORDER — TRAZODONE HCL 150 MG PO TABS: 150.0000 mg | ORAL_TABLET | Freq: Every day | ORAL | 0 refills | Status: DC

## 2021-12-09 MED ORDER — CLONAZEPAM 0.5 MG PO TABS: 0.5000 mg | ORAL_TABLET | Freq: Three times a day (TID) | ORAL | 2 refills | Status: DC | PRN

## 2021-12-09 MED ORDER — LAMOTRIGINE 200 MG PO TABS: 200.0000 mg | ORAL_TABLET | Freq: Every day | ORAL | 0 refills | Status: DC
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
## 2021-12-15 ENCOUNTER — Other Ambulatory Visit: Payer: Self-pay | Admitting: Nurse Practitioner

## 2021-12-16 ENCOUNTER — Telehealth: Payer: Self-pay | Admitting: Orthopaedic Surgery

## 2021-12-16 NOTE — Telephone Encounter (Signed)
Requested medication (s) are due for refill today:   Yes ? ?Requested medication (s) are on the active medication list:   Yes ? ?Future visit scheduled:   Yes for 01/21/2022 ? ? ?Last ordered: 10/20/2021 #30, 1 refill ? ?Returned because protocol criteria not met.   Has upcoming appt     ? ?Requested Prescriptions  ?Pending Prescriptions Disp Refills  ? meloxicam (MOBIC) 15 MG tablet [Pharmacy Med Name: Meloxicam 15 MG Oral Tablet] 30 tablet 0  ?  Sig: Take 1 tablet by mouth once daily  ?  ? Analgesics:  COX2 Inhibitors Failed - 12/15/2021  4:01 PM  ?  ?  Failed - Manual Review: Labs are only required if the patient has taken medication for more than 8 weeks.  ?  ?  Failed - HGB in normal range and within 360 days  ?  Hemoglobin  ?Date Value Ref Range Status  ?11/25/2020 15.1 11.1 - 15.9 g/dL Final  ?  ?  ?  ?  Failed - Cr in normal range and within 360 days  ?  Creatinine, Ser  ?Date Value Ref Range Status  ?11/25/2020 0.83 0.57 - 1.00 mg/dL Final  ?  ?  ?  ?  Failed - HCT in normal range and within 360 days  ?  Hematocrit  ?Date Value Ref Range Status  ?11/25/2020 45.2 34.0 - 46.6 % Final  ?  ?  ?  ?  Failed - AST in normal range and within 360 days  ?  AST  ?Date Value Ref Range Status  ?11/25/2020 13 0 - 40 IU/L Final  ?  ?  ?  ?  Failed - ALT in normal range and within 360 days  ?  ALT  ?Date Value Ref Range Status  ?11/25/2020 15 0 - 32 IU/L Final  ?  ?  ?  ?  Failed - eGFR is 30 or above and within 360 days  ?  GFR calc Af Amer  ?Date Value Ref Range Status  ?11/25/2020 101 >59 mL/min/1.73 Final  ?  Comment:  ?  **In accordance with recommendations from the NKF-ASN Task force,** ?  [facility] is in the process of updating its eGFR calculation to the ?  2021 CKD-EPI creatinine equation that estimates kidney function ?  without a race variable. ?  ? ?GFR calc non Af Amer  ?Date Value Ref Range Status  ?11/25/2020 87 >59 mL/min/1.73 Final  ?  ?  ?  ?  Failed - Valid encounter within last 12 months  ?  Recent Outpatient  Visits   ? ?      ? 1 year ago Well woman exam with routine gynecological exam  ? [facility] Fleming, Zelda W, NP  ? 1 year ago Well woman exam without gynecological exam  ? [facility] Fleming, Zelda W, NP  ? 3 years ago Well woman exam without gynecological exam  ? [facility] Fleming, Zelda W, NP  ? 3 years ago Encounter for weight management  ? [facility] Fleming, Zelda W, NP  ? 3 years ago Mixed hyperlipidemia  ? [facility] Fleming, Zelda W, NP  ? ?  ?  ?Future Appointments   ? ?        ? In 1 month Fleming, Zelda W, NP [facility]  ? ?  ? ?  ?  ?    Passed - Patient is not pregnant  ?  ?  ? ?

## 2021-12-16 NOTE — Telephone Encounter (Signed)
Patient left a message stating her mother fell, broke her hip, and she will be caring for her. Due to this patient does not feel like she will be able to proceed with in office procedure on her knee 12/24/21. Patient will need to reschedule until end of May unless Dr. Steward Drone can give her a prescription for Hydrocodone for after procedure instead of just Ibuprofen because she know's her knee will hurt. Please call to advise patient. ?

## 2021-12-17 ENCOUNTER — Ambulatory Visit (HOSPITAL_BASED_OUTPATIENT_CLINIC_OR_DEPARTMENT_OTHER): Payer: Medicaid Other | Admitting: Orthopaedic Surgery

## 2021-12-24 ENCOUNTER — Ambulatory Visit (INDEPENDENT_AMBULATORY_CARE_PROVIDER_SITE_OTHER): Payer: Medicaid Other | Admitting: Orthopaedic Surgery

## 2021-12-24 ENCOUNTER — Other Ambulatory Visit (HOSPITAL_BASED_OUTPATIENT_CLINIC_OR_DEPARTMENT_OTHER): Payer: Self-pay | Admitting: Orthopaedic Surgery

## 2021-12-24 ENCOUNTER — Other Ambulatory Visit: Payer: Self-pay

## 2021-12-24 ENCOUNTER — Ambulatory Visit (HOSPITAL_BASED_OUTPATIENT_CLINIC_OR_DEPARTMENT_OTHER)
Admission: RE | Admit: 2021-12-24 | Discharge: 2021-12-24 | Disposition: A | Discharge status: Home / Self Care | Payer: Medicaid Other | Source: Ambulatory Visit | Attending: Orthopaedic Surgery | Admitting: Orthopaedic Surgery

## 2021-12-24 ENCOUNTER — Other Ambulatory Visit (HOSPITAL_BASED_OUTPATIENT_CLINIC_OR_DEPARTMENT_OTHER): Payer: Self-pay

## 2021-12-24 DIAGNOSIS — M25561 Pain in right knee: Secondary | ICD-10-CM | POA: Insufficient documentation

## 2021-12-24 DIAGNOSIS — M25562 Pain in left knee: Secondary | ICD-10-CM | POA: Diagnosis not present

## 2021-12-24 DIAGNOSIS — S83241A Other tear of medial meniscus, current injury, right knee, initial encounter: Secondary | ICD-10-CM | POA: Diagnosis not present

## 2021-12-24 DIAGNOSIS — M79662 Pain in left lower leg: Secondary | ICD-10-CM | POA: Diagnosis not present

## 2021-12-24 DIAGNOSIS — M79661 Pain in right lower leg: Secondary | ICD-10-CM | POA: Diagnosis not present

## 2021-12-24 MED ORDER — HYDROCODONE-ACETAMINOPHEN 5-325 MG PO TABS
1.0000 | ORAL_TABLET | Freq: Four times a day (QID) | ORAL | 0 refills | Status: DC | PRN
  Filled 2021-12-24: qty 10, 3d supply, fill #0

## 2021-12-24 MED ORDER — TRIAMCINOLONE ACETONIDE 40 MG/ML IJ SUSP
80.0000 mg | INTRAMUSCULAR | Status: AC | PRN
  Administered 2021-12-24: 80 mg via INTRA_ARTICULAR

## 2021-12-24 MED ORDER — LIDOCAINE HCL 1 % IJ SOLN
4.0000 mL | INTRAMUSCULAR | Status: AC | PRN
  Administered 2021-12-24: 4 mL
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
## 2021-12-27 ENCOUNTER — Encounter (HOSPITAL_BASED_OUTPATIENT_CLINIC_OR_DEPARTMENT_OTHER): Payer: Self-pay | Admitting: Orthopaedic Surgery

## 2021-12-29 ENCOUNTER — Other Ambulatory Visit (HOSPITAL_BASED_OUTPATIENT_CLINIC_OR_DEPARTMENT_OTHER): Payer: Self-pay | Admitting: Orthopaedic Surgery

## 2021-12-29 ENCOUNTER — Telehealth (HOSPITAL_COMMUNITY): Payer: Self-pay | Admitting: *Deleted

## 2021-12-29 MED ORDER — MELOXICAM 15 MG PO TABS: 15.0000 mg | ORAL_TABLET | Freq: Every day | ORAL | 0 refills | Status: DC

## 2021-12-29 MED ORDER — LORAZEPAM 0.5 MG PO TABS
ORAL_TABLET | ORAL | 0 refills | Status: DC
Start: 2021-12-29 — End: 2022-01-21

## 2021-12-29 NOTE — Telephone Encounter (Signed)
I will send ativan 0.5 mg tab to take as needed. #20 ?Please call patient. Thanks ?

## 2021-12-29 NOTE — Telephone Encounter (Signed)
Writer spoke with pt who called stating that her pharmacy, [facility] on [address] is currently out of stock of Klonopin 0.5 mg. Nurse verified this with pt pharmacy. So pt is asking if you would send in a different benzo in the meantime so she doesn't get sick. 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 Pt was anxious, tearful on the phone. Pt scheduled for cisit on 01/21/22. Please review and advise. Thanks.  ?

## 2021-12-29 NOTE — Telephone Encounter (Signed)
I know. Pharmacy does not have ANY Klonopin is what they told me. I recommenced trying another pharmacy but pt really doesn't want to do that.

## 2021-12-29 NOTE — Telephone Encounter (Signed)
I do not know what other benzodiazepine her pharmacy have. I will rather check with her pharmacy if they have Klonopin.  Do not want to change to a different benzodiazepine. ?

## 2021-12-29 NOTE — Telephone Encounter (Signed)
Will do!

## 2021-12-31 ENCOUNTER — Ambulatory Visit
Admission: RE | Admit: 2021-12-31 | Discharge: 2021-12-31 | Disposition: A | Discharge status: Home / Self Care | Payer: Medicaid Other | Source: Ambulatory Visit | Attending: Nurse Practitioner | Admitting: Nurse Practitioner

## 2021-12-31 DIAGNOSIS — Z1231 Encounter for screening mammogram for malignant neoplasm of breast: Secondary | ICD-10-CM | POA: Diagnosis not present

## 2022-01-02 ENCOUNTER — Other Ambulatory Visit: Payer: Self-pay

## 2022-01-02 ENCOUNTER — Encounter (HOSPITAL_BASED_OUTPATIENT_CLINIC_OR_DEPARTMENT_OTHER): Payer: Self-pay | Admitting: Physical Therapy

## 2022-01-02 ENCOUNTER — Other Ambulatory Visit (HOSPITAL_BASED_OUTPATIENT_CLINIC_OR_DEPARTMENT_OTHER): Payer: Self-pay | Admitting: Orthopaedic Surgery

## 2022-01-02 ENCOUNTER — Ambulatory Visit (HOSPITAL_BASED_OUTPATIENT_CLINIC_OR_DEPARTMENT_OTHER): Payer: Medicaid Other | Attending: Orthopaedic Surgery | Admitting: Physical Therapy

## 2022-01-02 DIAGNOSIS — G8929 Other chronic pain: Secondary | ICD-10-CM

## 2022-01-02 DIAGNOSIS — M25561 Pain in right knee: Secondary | ICD-10-CM | POA: Insufficient documentation

## 2022-01-02 DIAGNOSIS — M25661 Stiffness of right knee, not elsewhere classified: Secondary | ICD-10-CM | POA: Diagnosis not present

## 2022-01-02 DIAGNOSIS — S83241A Other tear of medial meniscus, current injury, right knee, initial encounter: Secondary | ICD-10-CM | POA: Insufficient documentation

## 2022-01-02 DIAGNOSIS — R2689 Other abnormalities of gait and mobility: Secondary | ICD-10-CM | POA: Diagnosis not present

## 2022-01-02 DIAGNOSIS — R600 Localized edema: Secondary | ICD-10-CM | POA: Diagnosis not present

## 2022-01-02 MED ORDER — IBUPROFEN 800 MG PO TABS: 800.0000 mg | ORAL_TABLET | Freq: Three times a day (TID) | ORAL | 0 refills | Status: DC
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
## 2022-01-05 ENCOUNTER — Other Ambulatory Visit: Payer: Self-pay

## 2022-01-05 ENCOUNTER — Ambulatory Visit (HOSPITAL_BASED_OUTPATIENT_CLINIC_OR_DEPARTMENT_OTHER): Payer: Medicaid Other | Admitting: Physical Therapy

## 2022-01-05 ENCOUNTER — Encounter (HOSPITAL_BASED_OUTPATIENT_CLINIC_OR_DEPARTMENT_OTHER): Payer: Self-pay | Admitting: Physical Therapy

## 2022-01-05 DIAGNOSIS — G8929 Other chronic pain: Secondary | ICD-10-CM

## 2022-01-05 DIAGNOSIS — M25561 Pain in right knee: Secondary | ICD-10-CM | POA: Diagnosis not present

## 2022-01-05 DIAGNOSIS — M25661 Stiffness of right knee, not elsewhere classified: Secondary | ICD-10-CM

## 2022-01-05 DIAGNOSIS — R2689 Other abnormalities of gait and mobility: Secondary | ICD-10-CM

## 2022-01-05 DIAGNOSIS — S83241A Other tear of medial meniscus, current injury, right knee, initial encounter: Secondary | ICD-10-CM | POA: Diagnosis not present

## 2022-01-05 DIAGNOSIS — R600 Localized edema: Secondary | ICD-10-CM
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
## 2022-01-07 ENCOUNTER — Other Ambulatory Visit: Payer: Self-pay

## 2022-01-07 ENCOUNTER — Other Ambulatory Visit (HOSPITAL_BASED_OUTPATIENT_CLINIC_OR_DEPARTMENT_OTHER): Payer: Self-pay | Admitting: Orthopaedic Surgery

## 2022-01-07 ENCOUNTER — Ambulatory Visit (HOSPITAL_BASED_OUTPATIENT_CLINIC_OR_DEPARTMENT_OTHER): Payer: Medicaid Other | Admitting: Orthopaedic Surgery

## 2022-01-07 ENCOUNTER — Ambulatory Visit (HOSPITAL_BASED_OUTPATIENT_CLINIC_OR_DEPARTMENT_OTHER)
Admission: RE | Admit: 2022-01-07 | Discharge: 2022-01-07 | Disposition: A | Discharge status: Home / Self Care | Payer: Medicaid Other | Source: Ambulatory Visit | Attending: Orthopaedic Surgery | Admitting: Orthopaedic Surgery

## 2022-01-07 DIAGNOSIS — M25561 Pain in right knee: Secondary | ICD-10-CM | POA: Diagnosis not present

## 2022-01-07 DIAGNOSIS — S83241A Other tear of medial meniscus, current injury, right knee, initial encounter: Secondary | ICD-10-CM

## 2022-01-07 DIAGNOSIS — M21161 Varus deformity, not elsewhere classified, right knee: Secondary | ICD-10-CM

## 2022-01-07 MED ORDER — TRIAMCINOLONE ACETONIDE 40 MG/ML IJ SUSP
80.0000 mg | INTRAMUSCULAR | Status: AC | PRN
  Administered 2022-01-07: 80 mg via INTRA_ARTICULAR

## 2022-01-07 MED ORDER — LIDOCAINE HCL 1 % IJ SOLN
4.0000 mL | INTRAMUSCULAR | Status: AC | PRN
  Administered 2022-01-07: 4 mL
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
## 2022-01-16 ENCOUNTER — Other Ambulatory Visit (HOSPITAL_BASED_OUTPATIENT_CLINIC_OR_DEPARTMENT_OTHER): Payer: Self-pay | Admitting: Orthopaedic Surgery

## 2022-01-16 ENCOUNTER — Encounter (HOSPITAL_BASED_OUTPATIENT_CLINIC_OR_DEPARTMENT_OTHER): Payer: Self-pay | Admitting: Physical Therapy

## 2022-01-16 ENCOUNTER — Ambulatory Visit (HOSPITAL_BASED_OUTPATIENT_CLINIC_OR_DEPARTMENT_OTHER): Payer: Medicaid Other | Attending: Orthopaedic Surgery | Admitting: Physical Therapy

## 2022-01-16 DIAGNOSIS — R2689 Other abnormalities of gait and mobility: Secondary | ICD-10-CM | POA: Diagnosis not present

## 2022-01-16 DIAGNOSIS — M25661 Stiffness of right knee, not elsewhere classified: Secondary | ICD-10-CM | POA: Diagnosis not present

## 2022-01-16 DIAGNOSIS — R600 Localized edema: Secondary | ICD-10-CM | POA: Diagnosis not present

## 2022-01-16 DIAGNOSIS — M25561 Pain in right knee: Secondary | ICD-10-CM | POA: Insufficient documentation

## 2022-01-16 DIAGNOSIS — G8929 Other chronic pain: Secondary | ICD-10-CM | POA: Diagnosis not present

## 2022-01-16 NOTE — Therapy (Signed)
?OUTPATIENT PHYSICAL THERAPY LOWER EXTREMITY Treatment  ? ? ?Patient Name: Sarah Salas ?MRN: [id number] ?DOB:1975-12-31, 46 y.o., female ?Today's Date: 01/16/2022 ? ? PT End of Session - 01/16/22 1306   ? ? Visit Number 3   ? Number of Visits 12   ? Date for PT Re-Evaluation 02/13/22   ? Authorization Type [employer]   ? PT Start Time 1300   ? PT Stop Time [id number]   ? PT Time Calculation (min) 43 min   ? Activity Tolerance Patient tolerated treatment well   ? Behavior During Therapy [facility] for tasks assessed/performed   ? ?  ?  ? ?  ? ? ?Past Medical History:  ?Diagnosis Date  ? Anxiety   ? Bipolar 1 disorder ([place])   ? Brain injury ([place])   ? Hypertension   ? ?Past Surgical History:  ?Procedure Laterality Date  ? BRAIN SURGERY    ? FRACTURE SURGERY    ? ?Patient Active Problem List  ? Diagnosis Date Noted  ? Anxiety 11/08/2017  ? Bipolar affective disorder ([place]) 11/05/2017  ? ? ?PCP: Gildardo Pounds, NP ? ?REFERRING PROVIDER: Gildardo Pounds, NP ? ?REFERRING DIAG:  ?  ?S83.241A (ICD-10-CM) - Acute medial meniscus tear of right knee, initial encounter  ? ? ?THERAPY DIAG:  ?Chronic pain of right knee ? ?Stiffness of right knee, not elsewhere classified ? ?Other abnormalities of gait and mobility ? ?Localized edema ? ?ONSET DATE: December was the onset of pain. She  had a diagnostic arthroscopy on 3/15 ? ?SUBJECTIVE:  ? ?SUBJECTIVE STATEMENT: ?Patient reports her knee has been sore. 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 ? ?PAIN:  ?Are you having pain? Yes: NPRS scale:4/10  ?Pain location: inferior knee  ?Pain description: aching  ?Aggravating factors: standing and walking ?Relieving factors: sitting  ? ?PRECAUTIONS: None ? ?WEIGHT BEARING RESTRICTIONS No ? ?FALLS:  ?Has patient fallen in last 6 months? No, Number of falls:  ? ?LIVING ENVIRONMENT: ?2 level house; coming down the steps she has figured out a way to do it that is less painful  ?OCCUPATION:   ?On [employer] 2nd to traumatic brain injury  ? ?Hobbies:  ?Drawing and painting  ?Speed walking  ?Patient is taking care of her parents full time  ? ? ? ? ?PLOF: Independent ? ?PATIENT GOALS  ? ?To get back to moving/ avoud surgery if able  ? ? ?OBJECTIVE:  ? ? ?TODAY'S TREATMENT: ?4/7 ?Quad set 2x15  ? ?Bridge 2x10  ?R SLR 2x10  ?Side lying clamshell green 2x10  ? ? ?Standing heel raise 2x10  ?Standing slow march low march with the left x7 patient began to have sharp pain despite adjustment in technique.   ? ?01/05/2022 ?Quad set 3x10  ? ?Bridge 2x10  ?R SLR 2x10  ?Side lying clamshell green 2x10  ? ?Standing heel raise 2x10  ?Standing slow march low march with the left x10 advised not to do if it makes her sore.  ? ?Manual: patella inferior/superior glide medial/lateral  ? ?Grade II and III PA and AP glides  ? ? ? ?Eval:  ? ?Exercises ?- Supine Quad Set  3 sets - 10 reps 5 sec hold  ?- Small Range Straight Leg Raise  -10 reps ?- Long Sitting 4 Way Patellar Glide  - 10 reps ?- Heel Raises with Counter Support  10 reps ? ?Manual: prom into flexion and extension. Patella glides  ? ? ?  PATIENT EDUCATION:  ?Education details: reviewed HEP; self patella mobility; pain free progression of activity, symptom management  ?Person educated: Patient ?Education method: Explanation, Demonstration, Tactile cues, Verbal cues, and Handouts ?Education comprehension: verbalized understanding, returned demonstration, verbal cues required, tactile cues required, and needs further education ? ? ?HEP ? ?HOME Access Code: [facility] ?URL: [email or web address] ?Date: 01/02/2022 ?Prepared by: Carolyne Littles ? ?Exercises ?- Supine Quad Set  - 2 x daily - 7 x weekly - 3 sets - 10 reps ?- Small Range Straight Leg Raise  - 2 x daily - 7 x weekly - 3 sets - 10 reps ?- Long Sitting 4 Way Patellar Glide  - 2 x daily - 7 x weekly - 3 sets - 10 reps ?- Heel Raises with Counter Support  - 2 x daily - 7 x weekly - 3 sets - 10 repsEXERCISE  PROGRAM: ? ? ?ASSESSMENT: ? ?CLINICAL IMPRESSION: ?Therapy focused on more aggressive mobilization of the knee cap. After she had some crepitus but did not have the large poppeing that she had intially. She still has some tightness in her IT band. Therapy reviewed her home exercises. Shewas advised to continue working on her knee cap on her own. We reviewed her exercises with her. The patient had some sharp pains with standing march. Treatment ended at this time. She was advised to practice at home.  ? ? ?OBJECTIVE IMPAIRMENTS Abnormal gait, decreased activity tolerance, decreased endurance, decreased mobility, difficulty walking, decreased ROM, decreased strength, increased edema, and pain.  ? ?ACTIVITY LIMITATIONS laundry, yard work, shopping, and caregiving for her parents  .  ? ?PERSONAL FACTORS 1-2 comorbidities: anxiety, old TBI   are also affecting patient's functional outcome.  ? ? ?REHAB POTENTIAL: Good ? ?CLINICAL DECISION MAKING: Evolving/moderate complexity progressive pain and decline of mobility  ? ?EVALUATION COMPLEXITY: Moderate ? ? ?GOALS: ?Goals reviewed with patient? Yes ? ?SHORT TERM GOALS: Target date: 02/06/2022 ? ?Patient will demonstrate full flexion and extension without pain  ?Baseline:  ?Goal status: INITIAL ? ?2.  Patient will ambulate with 50% less pain  ?Baseline:  ?Goal status: INITIAL ? ?3.  Patient will be independent with HEP  ?Baseline:  ?Goal status: INITIAL ? ? ?LONG TERM GOALS: Target date: 02/27/2022 ? ?Patient will stand for 35 minutes without pain in order to give care to her parents  ?Baseline:  ?Goal status: INITIAL ? ?2.  Patient will ambulate 3000' without pain in order to go shopping  ?Baseline:  ?Goal status: INITIAL ? ?3.  Patient will go up/down 12 steps without pain  ?Baseline:  ?Goal status: INITIAL ?PLAN: ?PT FREQUENCY: 1-2x/week ? ?PT DURATION: 6 weeks ? ?PLANNED INTERVENTIONS: Therapeutic exercises, Therapeutic activity, Neuromuscular re-education, Balance  training, Gait training, Patient/Family education, Joint mobilization, Aquatic Therapy, Dry Needling, Electrical stimulation, Cryotherapy, Moist heat, Taping, and Manual therapy ? ?PLAN FOR NEXT SESSION: continue with patellar mobilization and ROM; add low slow march; add side lying clam or supine; review hamstring stretch and potentially thomas stretch if tolerated. Modalities as tolerated. Consider vaso  ? ?Check all possible CPT codes: 97110- Therapeutic Exercise, [phone]- Neuro Re-education, [phone] - Gait Training, [phone] - Manual Therapy, 97530 - Therapeutic Activities, 97014 - Electrical stimulation (unattended), [id number] - Ultrasound, and [id number] - Aquatic therapy    ? ?If treatment provided at initial evaluation, no treatment charged due to lack of authorization.    ?  ? ?Carney Living, PT ?01/16/2022, 1:06 PM  ?

## 2022-01-21 ENCOUNTER — Telehealth (HOSPITAL_BASED_OUTPATIENT_CLINIC_OR_DEPARTMENT_OTHER): Payer: Medicaid Other | Admitting: Psychiatry

## 2022-01-21 ENCOUNTER — Encounter: Payer: Self-pay | Admitting: Nurse Practitioner

## 2022-01-21 ENCOUNTER — Encounter (HOSPITAL_COMMUNITY): Payer: Self-pay | Admitting: Psychiatry

## 2022-01-21 ENCOUNTER — Ambulatory Visit: Payer: Medicaid Other | Attending: Nurse Practitioner | Admitting: Nurse Practitioner

## 2022-01-21 VITALS — BP 119/81 | HR 74 | Ht 70.0 in | Wt 236.8 lb

## 2022-01-21 DIAGNOSIS — F319 Bipolar disorder, unspecified: Secondary | ICD-10-CM | POA: Diagnosis not present

## 2022-01-21 DIAGNOSIS — F902 Attention-deficit hyperactivity disorder, combined type: Secondary | ICD-10-CM | POA: Diagnosis not present

## 2022-01-21 DIAGNOSIS — R7989 Other specified abnormal findings of blood chemistry: Secondary | ICD-10-CM

## 2022-01-21 DIAGNOSIS — F419 Anxiety disorder, unspecified: Secondary | ICD-10-CM

## 2022-01-21 DIAGNOSIS — Z0000 Encounter for general adult medical examination without abnormal findings: Secondary | ICD-10-CM | POA: Diagnosis not present

## 2022-01-21 DIAGNOSIS — E669 Obesity, unspecified: Secondary | ICD-10-CM | POA: Diagnosis not present

## 2022-01-21 DIAGNOSIS — E785 Hyperlipidemia, unspecified: Secondary | ICD-10-CM | POA: Diagnosis not present

## 2022-01-21 MED ORDER — LAMOTRIGINE 200 MG PO TABS: 200.0000 mg | ORAL_TABLET | Freq: Every day | ORAL | 0 refills | Status: DC

## 2022-01-21 MED ORDER — CITALOPRAM HYDROBROMIDE 20 MG PO TABS: 20.0000 mg | ORAL_TABLET | Freq: Every day | ORAL | 0 refills | Status: DC

## 2022-01-21 MED ORDER — CLONAZEPAM 0.5 MG PO TABS: 0.5000 mg | ORAL_TABLET | Freq: Three times a day (TID) | ORAL | 2 refills | Status: DC | PRN

## 2022-01-21 MED ORDER — ARIPIPRAZOLE 5 MG PO TABS: ORAL_TABLET | ORAL | 0 refills | Status: DC

## 2022-01-21 MED ORDER — ATOMOXETINE HCL 40 MG PO CAPS: 40.0000 mg | ORAL_CAPSULE | Freq: Every day | ORAL | 0 refills | Status: DC

## 2022-01-21 MED ORDER — TRAZODONE HCL 150 MG PO TABS: 150.0000 mg | ORAL_TABLET | Freq: Every day | ORAL | 0 refills | Status: DC

## 2022-01-21 NOTE — Progress Notes (Signed)
? ?Assessment & Plan:  ?Yetunde was seen today for annual exam. ? ?Diagnoses and all orders for this visit: ? ?Encounter for annual physical exam ?-     CMP14+EGFR ?-     CBC with Differential ?-     Lipid panel ? ?Obesity (BMI 30-39.9) ?-     Amb Ref to Medical Weight Management ?-     CMP14+EGFR ?Weight loss has been slow but she has lost almost 30 pounds over the past 3 years.  Would like to be referred to medical weight management for weight loss options ? ?Dyslipidemia, goal LDL below 100 ?-     CMP14+EGFR ?-     Lipid panel ? ?Abnormal CBC ?-     CMP14+EGFR ?-     CBC with Differential ? ?Bipolar I disorder ([place]) ?Continue follow-up with Dr. Adele Schilder ? ? ?Patient has been counseled on age-appropriate routine health concerns for screening and prevention. These are reviewed and up-to-date. Referrals have been placed accordingly. Immunizations are up-to-date or declined.    ?Subjective:  ? ?Chief Complaint  ?Patient presents with  ?? Annual Exam  ? ?HPI ?Graviela Nodal 46 y.o. female presents to office today for annual exam. Notes increased stress as she is the primary caregiver for her mother who just fractured her femur is is currently NWB.  ? ?She is being followed by Ortho for acute medial meniscal tear of the right knee. ? ?Review of Systems  ?Constitutional:  Negative for fever, malaise/fatigue and weight loss.  ?HENT: Negative.  Negative for nosebleeds.   ?Eyes: Negative.  Negative for blurred vision, double vision and photophobia.  ?Respiratory: Negative.  Negative for cough and shortness of breath.   ?Cardiovascular: Negative.  Negative for chest pain, palpitations and leg swelling.  ?Gastrointestinal: Negative.  Negative for heartburn, nausea and vomiting.  ?Musculoskeletal: Negative.  Negative for myalgias.  ?Neurological: Negative.  Negative for dizziness, focal weakness, seizures and headaches.  ?Psychiatric/Behavioral:  Negative for suicidal ideas. The patient is nervous/anxious.   ? ?Past Medical  History:  ?Diagnosis Date  ?? Anxiety   ?? Bipolar 1 disorder ([place])   ?? Brain injury ([place])   ?? Hypertension   ? ? ?Past Surgical History:  ?Procedure Laterality Date  ?? BRAIN SURGERY    ?? FRACTURE SURGERY    ? ? ?Family History  ?Problem Relation Age of Onset  ?? Bipolar disorder Mother   ?? Hypertension Mother   ?? Miscarriages / [place] Mother   ?? Vision loss Mother   ?? Varicose Veins Mother   ?? Alcohol abuse Father   ?? Heart disease Father   ?? Learning disabilities Father   ?? Bipolar disorder Sister   ?? Learning disabilities Sister   ?? Diabetes Paternal Aunt   ?? Bipolar disorder Maternal Grandmother   ?? Hypertension Maternal Grandfather   ?? Cancer Paternal Grandmother   ?? Cancer Paternal Grandfather   ? ? ?Social History Reviewed with no changes to be made today.  ? ?Outpatient Medications Prior to Visit  ?Medication Sig Dispense Refill  ?? ARIPiprazole (ABILIFY) 5 MG tablet Take 1 tablet by mouth once daily 90 tablet 0  ?? atomoxetine (STRATTERA) 40 MG capsule Take 1 capsule (40 mg total) by mouth daily. 90 capsule 0  ?? citalopram (CELEXA) 20 MG tablet Take 1 tablet (20 mg total) by mouth daily. 90 tablet 0  ?? clonazePAM (KLONOPIN) 0.5 MG tablet Take 1 tablet (0.5 mg total) by mouth 3 (three) times daily as needed for anxiety.  90 tablet 2  ?? ibuprofen (ADVIL) 800 MG tablet TAKE 1 TABLET BY MOUTH EVERY 8 HOURS FOR 10 DAYS WITH FOOD PLEASE ALTERNATE WITH TYLENOL 30 tablet 0  ?? lamoTRIgine (LAMICTAL) 200 MG tablet Take 1 tablet (200 mg total) by mouth daily. 90 tablet 0  ?? latanoprost (XALATAN) 0.005 % ophthalmic solution INSTILL 1 DROP INTO EACH EYE EVERY NIGHT AS DIRECTED    ?? traZODone (DESYREL) 150 MG tablet Take 1 tablet (150 mg total) by mouth at bedtime. 90 tablet 0  ? ?No facility-administered medications prior to visit.  ? ? ?No Known Allergies ? ?   ?Objective:  ?  ?BP 119/81   Pulse 74   Ht 5' 10"  (1.778 m)   Wt 236 lb 12.8 oz (107.4 kg)   SpO2 99%   BMI 33.98 kg/m?  ?Wt  Readings from Last 3 Encounters:  ?01/21/22 236 lb 12.8 oz (107.4 kg)  ?01/21/22 230 lb (104.3 kg)  ?12/09/21 220 lb (99.8 kg)  ? ? ?Physical Exam ?Constitutional:   ?   Appearance: She is well-developed.  ?HENT:  ?   Head: Normocephalic and atraumatic.  ?   Right Ear: Hearing, ear canal and external ear normal. There is impacted cerumen.  ?   Left Ear: Hearing, ear canal and external ear normal. There is impacted cerumen.  ?   Nose: Nose normal.  ?   Right Turbinates: Not enlarged.  ?   Left Turbinates: Not enlarged.  ?   Mouth/Throat:  ?   Lips: Pink.  ?   Mouth: Mucous membranes are moist.  ?   Dentition: No dental tenderness, gingival swelling, dental abscesses or gum lesions.  ?   Pharynx: No oropharyngeal exudate.  ?Eyes:  ?   General: No scleral icterus.    ?   Right eye: No discharge.  ?   Extraocular Movements: Extraocular movements intact.  ?   Conjunctiva/sclera: Conjunctivae normal.  ?   Pupils: Pupils are equal, round, and reactive to light.  ?Neck:  ?   Thyroid: No thyromegaly.  ?   Trachea: No tracheal deviation.  ?Cardiovascular:  ?   Rate and Rhythm: Normal rate and regular rhythm.  ?   Heart sounds: Normal heart sounds. No murmur heard. ?  No friction rub.  ?Pulmonary:  ?   Effort: Pulmonary effort is normal. No accessory muscle usage or respiratory distress.  ?   Breath sounds: Normal breath sounds. No decreased breath sounds, wheezing, rhonchi or rales.  ?Abdominal:  ?   General: Bowel sounds are normal. There is no distension.  ?   Palpations: Abdomen is soft. There is no mass.  ?   Tenderness: There is no abdominal tenderness. There is no right CVA tenderness, left CVA tenderness, guarding or rebound.  ?   Hernia: No hernia is present.  ?Musculoskeletal:     ?   General: No tenderness or deformity. Normal range of motion.  ?   Cervical back: Normal range of motion and neck supple.  ?   Right knee: Swelling present.  ?Lymphadenopathy:  ?   Cervical: No cervical adenopathy.  ?Skin: ?   General:  Skin is warm and dry.  ?   Findings: No erythema.  ?Neurological:  ?   Mental Status: She is alert and oriented to person, place, and time.  ?   Cranial Nerves: No cranial nerve deficit.  ?   Motor: Motor function is intact.  ?   Coordination: Coordination is intact. Coordination normal.  ?  Gait: Gait is intact.  ?   Deep Tendon Reflexes:  ?   Reflex Scores: ?     Patellar reflexes are 1+ on the right side and 1+ on the left side. ?Psychiatric:     ?   Attention and Perception: Attention normal.     ?   Mood and Affect: Mood normal.     ?   Speech: Speech normal.     ?   Behavior: Behavior normal.     ?   Thought Content: Thought content normal.     ?   Judgment: Judgment normal.  ? ? ? ? ?   ?Patient has been counseled extensively about nutrition and exercise as well as the importance of adherence with medications and regular follow-up. The patient was given clear instructions to go to ER or return to medical center if symptoms don't improve, worsen or new problems develop. The patient verbalized understanding.  ? ?Follow-up: Return if symptoms worsen or fail to improve.  ? ?Gildardo Pounds, FNP-BC ?[facility] ?[place], [place] ?[phone]   ?01/21/2022, 2:23 PM ?

## 2022-01-21 NOTE — Progress Notes (Signed)
Virtual Visit via Telephone Note ? ?I connected with Purcell Nails on 01/21/22 at  8:40 AM EDT by telephone and verified that I am speaking with the correct person using two identifiers. ? ?Location: ?Patient: Home ?Provider: Home office ?  ?I discussed the limitations, risks, security and privacy concerns of performing an evaluation and management service by telephone and the availability of in person appointments. I also discussed with the patient that there may be a patient responsible charge related to this service. The patient expressed understanding and agreed to proceed. ? ? ?History of Present Illness: ?Patient is evaluated by phone session.  She is under stress as taking care of her 44 year old mother but some relief as stepfather is also helping her.  She had called earlier that cannot get Klonopin and we prescribed Ativan but patient did not like it as she still struggle with sleep and medicine did not last long.  She like to go back on Klonopin.  Patient reported now medicine is available in the pharmacy because it was out of stock before.  She had a trip to [place] with with the boyfriend and that did very well.  Patient cannot stay with her best friend Baxter Flattery because her husband did not allow anyone to stay.  Patient sometimes feels that she is stuck living with her mother but now at least she has some support from her stepfather.  She is not sleeping well but she feels symptoms are manageable.  We have increased Lamictal on the last visit and that helped her a lot.  She also had a visit to her physician for his knee pain and received injection and she is feeling better but she admitted weight gain because not walking as much.  She denies any suicidal thoughts.  She gets easily emotional frustrated but she feels symptoms are manageable.  She had a good support from her best friend Baxter Flattery and her boyfriend. ?  ?Past Psychiatric History:  ?H/O ADHD, bipolar disorder.  Vyvanse did not help.  Adderall helped.  Latuda help in the beginning but then got worse with increased dose.  Vistaril did not help.  No h/o paranoia, inpatient treatment or suicidal attempt.     H/O TBI ? ?Psychiatric Specialty Exam: ?Physical Exam  ?Review of Systems  ?Weight 230 lb (104.3 kg).There is no height or weight on file to calculate BMI.  ?General Appearance: NA  ?Eye Contact:  NA  ?Speech:   fast  ?Volume:  Increased  ?Mood:  Anxious and emotional  ?Affect:  NA  ?Thought Process:  Descriptions of Associations: Circumstantial  ?Orientation:  Full (Time, Place, and Person)  ?Thought Content:  Rumination  ?Suicidal Thoughts:  No  ?Homicidal Thoughts:  No  ?Memory:  Immediate;   Fair ?Recent;   Fair ?Remote;   Fair  ?Judgement:  Fair  ?Insight:  Shallow  ?Psychomotor Activity:  Increased  ?Concentration:  Concentration: Fair and Attention Span: Fair  ?Recall:  Fair  ?Fund of Knowledge:  Fair  ?Language:  Fair  ?Akathisia:  No  ?Handed:  Right  ?AIMS (if indicated):     ?Assets:  Communication Skills ?Desire for Improvement ?Housing ?Social Support  ?ADL's:  Intact  ?Cognition:  WNL  ?Sleep:   Fair  ?  ? ? ?Assessment and Plan: ?Bipolar disorder type I.  Anxiety.  ADHD, combined type. ? ?Patient doing better with increased dose of Lamictal.  She like to go back on Klonopin and believe pharmacy has now available.  Discussed medication side  effects and benefits.  We will continue Lamictal 200 mg daily, Abilify 5 mg daily, Celexa 20 mg daily, Strattera 40 mg daily trazodone 150 mg at bedtime and Klonopin 0.5 mg 3 times a day.  Recommended to call us back if she has any question or any concern.  Follow-up in 3 months. ? ?Follow Up Instructions: ? ?  ?I discussed the assessment and treatment plan with the patient. The patient was provided an opportunity to ask questions and all were answered. The patient agreed with the plan and demonstrated an understanding of the instructions. ?  ?The patient was advised to call back or seek an in-person evaluation  if the symptoms worsen or if the condition fails to improve as anticipated. ? ?Collaboration of Care: Other provider involved in patient's care AEB notes are available in epic to review ? ?Patient/Guardian was advised Release of Information must be obtained prior to any record release in order to collaborate their care with an outside provider. Patient/Guardian was advised if they have not already done so to contact the registration department to sign all necessary forms in order for [place] to release information regarding their care.  ? ?Consent: Patient/Guardian gives verbal consent for treatment and assignment of benefits for services provided during this visit. Patient/Guardian expressed understanding and agreed to proceed.   ? ?I provided 22 minutes of non-face-to-face time during this encounter. ? ? ?Kathlee Nations, MD  ?

## 2022-01-22 LAB — CBC WITH DIFFERENTIAL/PLATELET
Basophils Absolute: 0 10*3/uL (ref 0.0–0.2)
Basos: 0 %
EOS (ABSOLUTE): 0.1 10*3/uL (ref 0.0–0.4)
Eos: 1 %
Hematocrit: 40.9 % (ref 34.0–46.6)
Hemoglobin: 14.3 g/dL (ref 11.1–15.9)
Immature Grans (Abs): 0.1 10*3/uL (ref 0.0–0.1)
Immature Granulocytes: 2 %
Lymphocytes Absolute: 2.1 10*3/uL (ref 0.7–3.1)
Lymphs: 28 %
MCH: 30.4 pg (ref 26.6–33.0)
MCHC: 35 g/dL (ref 31.5–35.7)
MCV: 87 fL (ref 79–97)
Monocytes Absolute: 0.6 10*3/uL (ref 0.1–0.9)
Monocytes: 8 %
Neutrophils Absolute: 4.6 10*3/uL (ref 1.4–7.0)
Neutrophils: 61 %
Platelets: 387 10*3/uL (ref 150–450)
RBC: 4.71 x10E6/uL (ref 3.77–5.28)
RDW: 12.9 % (ref 11.7–15.4)
WBC: 7.5 10*3/uL (ref 3.4–10.8)

## 2022-01-22 LAB — CMP14+EGFR
ALT: 16 IU/L (ref 0–32)
AST: 12 IU/L (ref 0–40)
Albumin/Globulin Ratio: 2.3 — ABNORMAL HIGH (ref 1.2–2.2)
Albumin: 4.8 g/dL (ref 3.8–4.8)
Alkaline Phosphatase: 82 IU/L (ref 44–121)
BUN/Creatinine Ratio: 20 (ref 9–23)
BUN: 15 mg/dL (ref 6–24)
Bilirubin Total: 0.3 mg/dL (ref 0.0–1.2)
CO2: 27 mmol/L (ref 20–29)
Calcium: 10.2 mg/dL (ref 8.7–10.2)
Chloride: 105 mmol/L (ref 96–106)
Creatinine, Ser: 0.75 mg/dL (ref 0.57–1.00)
Globulin, Total: 2.1 g/dL (ref 1.5–4.5)
Glucose: 86 mg/dL (ref 70–99)
Potassium: 5.1 mmol/L (ref 3.5–5.2)
Sodium: 144 mmol/L (ref 134–144)
Total Protein: 6.9 g/dL (ref 6.0–8.5)
eGFR: 101 mL/min/{1.73_m2} (ref >59)

## 2022-01-22 LAB — LIPID PANEL
Chol/HDL Ratio: 3.6 ratio (ref 0.0–4.4)
Cholesterol, Total: 229 mg/dL — ABNORMAL HIGH (ref 100–199)
HDL: 63 mg/dL (ref >39)
LDL Chol Calc (NIH): 152 mg/dL — ABNORMAL HIGH (ref 0–99)
Triglycerides: 80 mg/dL (ref 0–149)
VLDL Cholesterol Cal: 14 mg/dL (ref 5–40)

## 2022-01-23 ENCOUNTER — Encounter (HOSPITAL_BASED_OUTPATIENT_CLINIC_OR_DEPARTMENT_OTHER): Payer: Self-pay | Admitting: Physical Therapy

## 2022-01-30 ENCOUNTER — Ambulatory Visit (HOSPITAL_BASED_OUTPATIENT_CLINIC_OR_DEPARTMENT_OTHER): Payer: Medicaid Other | Admitting: Physical Therapy

## 2022-01-30 ENCOUNTER — Encounter (HOSPITAL_BASED_OUTPATIENT_CLINIC_OR_DEPARTMENT_OTHER): Payer: Self-pay | Admitting: Physical Therapy

## 2022-01-30 DIAGNOSIS — M25661 Stiffness of right knee, not elsewhere classified: Secondary | ICD-10-CM

## 2022-01-30 DIAGNOSIS — G8929 Other chronic pain: Secondary | ICD-10-CM

## 2022-01-30 DIAGNOSIS — R600 Localized edema: Secondary | ICD-10-CM | POA: Diagnosis not present

## 2022-01-30 DIAGNOSIS — M25561 Pain in right knee: Secondary | ICD-10-CM | POA: Diagnosis not present

## 2022-01-30 DIAGNOSIS — R2689 Other abnormalities of gait and mobility: Secondary | ICD-10-CM
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
## 2022-02-04 ENCOUNTER — Ambulatory Visit (HOSPITAL_BASED_OUTPATIENT_CLINIC_OR_DEPARTMENT_OTHER): Payer: Medicaid Other | Admitting: Orthopaedic Surgery

## 2022-02-04 ENCOUNTER — Encounter (HOSPITAL_BASED_OUTPATIENT_CLINIC_OR_DEPARTMENT_OTHER): Payer: Self-pay | Admitting: Physical Therapy

## 2022-02-04 ENCOUNTER — Encounter (HOSPITAL_BASED_OUTPATIENT_CLINIC_OR_DEPARTMENT_OTHER): Payer: Self-pay | Admitting: Orthopaedic Surgery

## 2022-02-04 ENCOUNTER — Ambulatory Visit (HOSPITAL_BASED_OUTPATIENT_CLINIC_OR_DEPARTMENT_OTHER): Payer: Medicaid Other | Admitting: Physical Therapy

## 2022-02-04 DIAGNOSIS — G8929 Other chronic pain: Secondary | ICD-10-CM | POA: Diagnosis not present

## 2022-02-04 DIAGNOSIS — M25661 Stiffness of right knee, not elsewhere classified: Secondary | ICD-10-CM | POA: Diagnosis not present

## 2022-02-04 DIAGNOSIS — S83241A Other tear of medial meniscus, current injury, right knee, initial encounter: Secondary | ICD-10-CM

## 2022-02-04 DIAGNOSIS — R2689 Other abnormalities of gait and mobility: Secondary | ICD-10-CM | POA: Diagnosis not present

## 2022-02-04 DIAGNOSIS — M25561 Pain in right knee: Secondary | ICD-10-CM | POA: Diagnosis not present

## 2022-02-04 DIAGNOSIS — M21161 Varus deformity, not elsewhere classified, right knee: Secondary | ICD-10-CM

## 2022-02-04 DIAGNOSIS — R600 Localized edema: Secondary | ICD-10-CM
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
## 2022-02-05 ENCOUNTER — Encounter (HOSPITAL_BASED_OUTPATIENT_CLINIC_OR_DEPARTMENT_OTHER): Payer: Self-pay | Admitting: Physical Therapy

## 2022-02-13 ENCOUNTER — Encounter (HOSPITAL_BASED_OUTPATIENT_CLINIC_OR_DEPARTMENT_OTHER): Payer: Self-pay | Admitting: Orthopaedic Surgery
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
## 2022-02-16 ENCOUNTER — Encounter (HOSPITAL_BASED_OUTPATIENT_CLINIC_OR_DEPARTMENT_OTHER): Payer: Self-pay | Admitting: Orthopaedic Surgery

## 2022-02-16 NOTE — Telephone Encounter (Signed)
Scheduled May 26th ?

## 2022-03-02 DIAGNOSIS — H5213 Myopia, bilateral: Secondary | ICD-10-CM | POA: Diagnosis not present

## 2022-03-06 ENCOUNTER — Ambulatory Visit (INDEPENDENT_AMBULATORY_CARE_PROVIDER_SITE_OTHER): Payer: Medicaid Other | Admitting: Orthopaedic Surgery

## 2022-03-06 ENCOUNTER — Ambulatory Visit (HOSPITAL_BASED_OUTPATIENT_CLINIC_OR_DEPARTMENT_OTHER): Payer: Self-pay | Admitting: Orthopaedic Surgery

## 2022-03-06 ENCOUNTER — Other Ambulatory Visit (HOSPITAL_BASED_OUTPATIENT_CLINIC_OR_DEPARTMENT_OTHER): Payer: Self-pay

## 2022-03-06 DIAGNOSIS — S83241A Other tear of medial meniscus, current injury, right knee, initial encounter: Secondary | ICD-10-CM

## 2022-03-06 DIAGNOSIS — M21161 Varus deformity, not elsewhere classified, right knee: Secondary | ICD-10-CM

## 2022-03-06 MED ORDER — ASPIRIN 325 MG PO TBEC
325.0000 mg | DELAYED_RELEASE_TABLET | Freq: Every day | ORAL | 0 refills | Status: DC
  Filled 2022-03-06: qty 30, 30d supply, fill #0

## 2022-03-06 MED ORDER — HYDROCODONE-ACETAMINOPHEN 5-325 MG PO TABS
1.0000 | ORAL_TABLET | Freq: Four times a day (QID) | ORAL | 0 refills | Status: DC | PRN
  Filled 2022-03-06: qty 30, 8d supply, fill #0
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
## 2022-03-13 ENCOUNTER — Encounter: Payer: Self-pay | Admitting: Nurse Practitioner

## 2022-03-16 ENCOUNTER — Encounter (HOSPITAL_BASED_OUTPATIENT_CLINIC_OR_DEPARTMENT_OTHER): Payer: Self-pay | Admitting: Orthopaedic Surgery

## 2022-03-20 DIAGNOSIS — H5213 Myopia, bilateral: Secondary | ICD-10-CM | POA: Diagnosis not present

## 2022-03-20 DIAGNOSIS — H52223 Regular astigmatism, bilateral: Secondary | ICD-10-CM | POA: Diagnosis not present

## 2022-03-25 ENCOUNTER — Encounter (HOSPITAL_BASED_OUTPATIENT_CLINIC_OR_DEPARTMENT_OTHER): Payer: Self-pay | Admitting: Orthopaedic Surgery

## 2022-04-06 IMAGING — MG MM DIGITAL SCREENING BILAT W/ TOMO AND CAD
8 series · 8 of 24 positions shown · non-contrast
Comparison: Previous exam(s).

CLINICAL DATA: Screening.

EXAM:
DIGITAL SCREENING BILATERAL MAMMOGRAM WITH TOMOSYNTHESIS AND CAD
TECHNIQUE: Bilateral screening digital craniocaudal and mediolateral oblique
mammograms were obtained. Bilateral screening digital breast
tomosynthesis was performed. The images were evaluated with
computer-aided detection.

[R CC synth-2D]
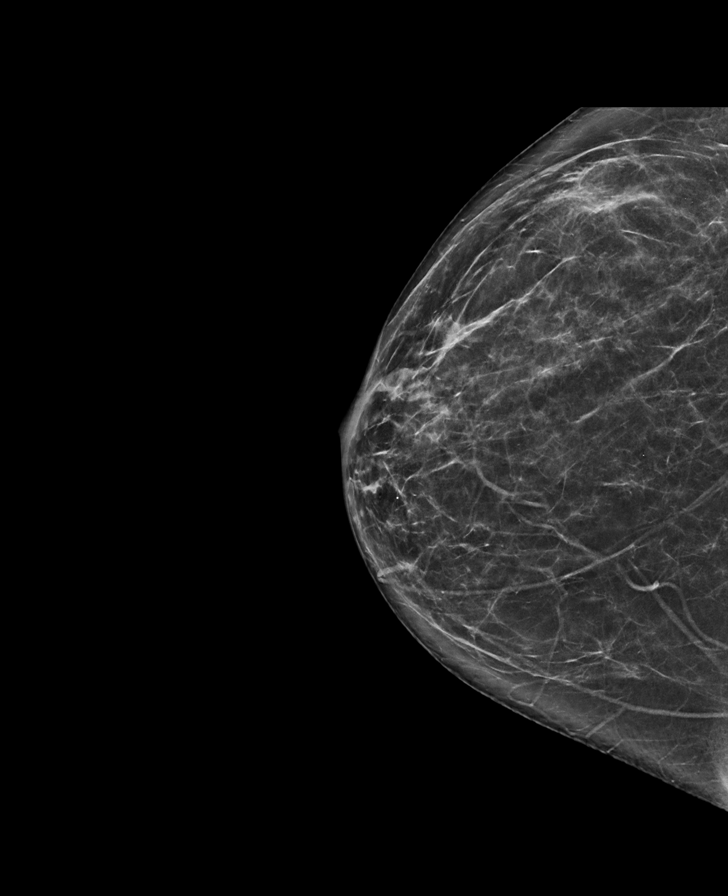

[L MLO synth-2D]
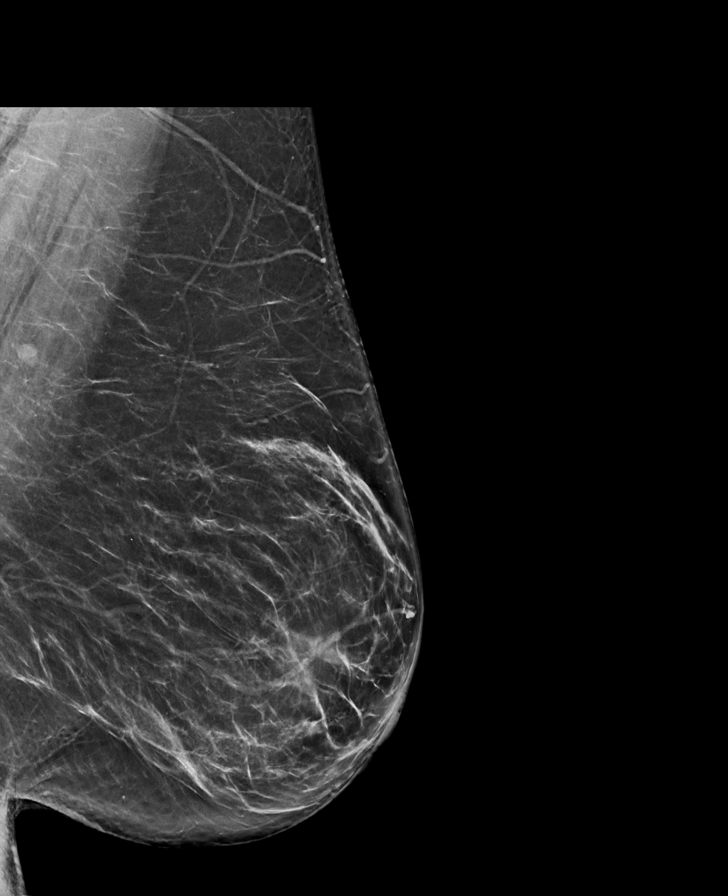

[R MLO synth-2D]
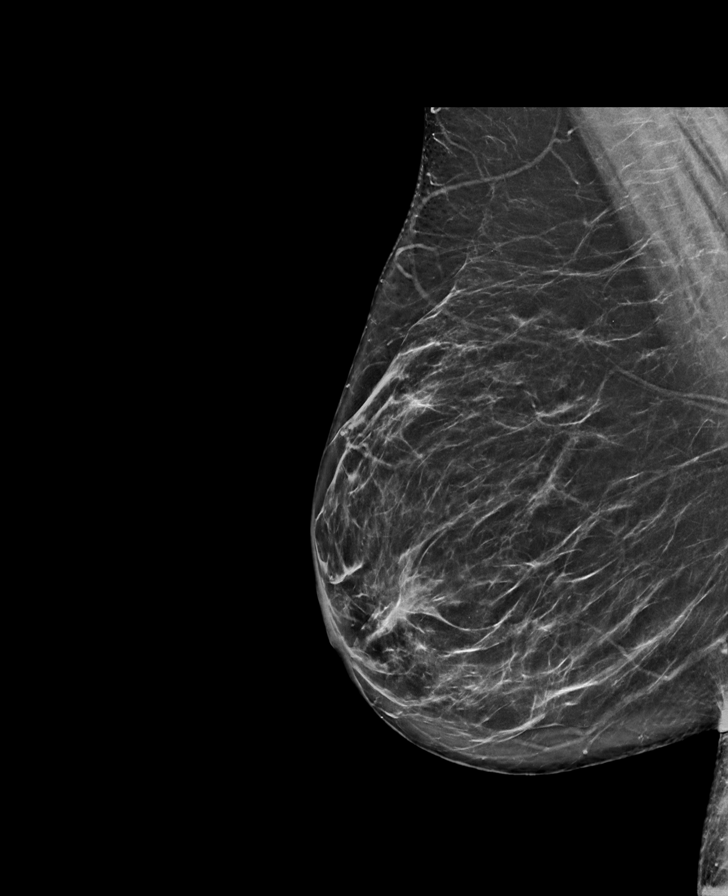

[L CC synth-2D]
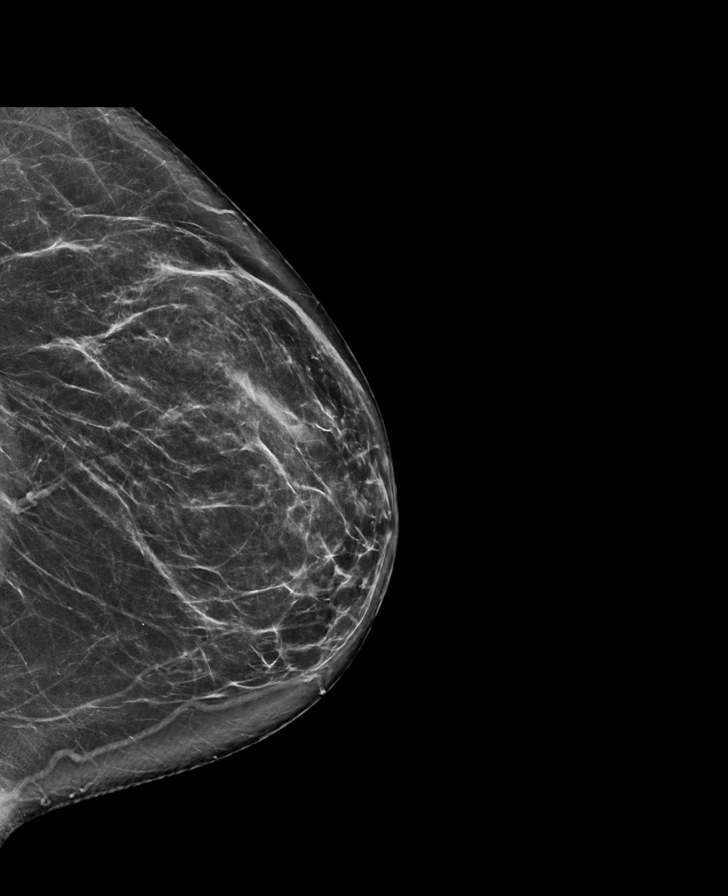

[L MLO tomo · tomo slice 47/92.0]
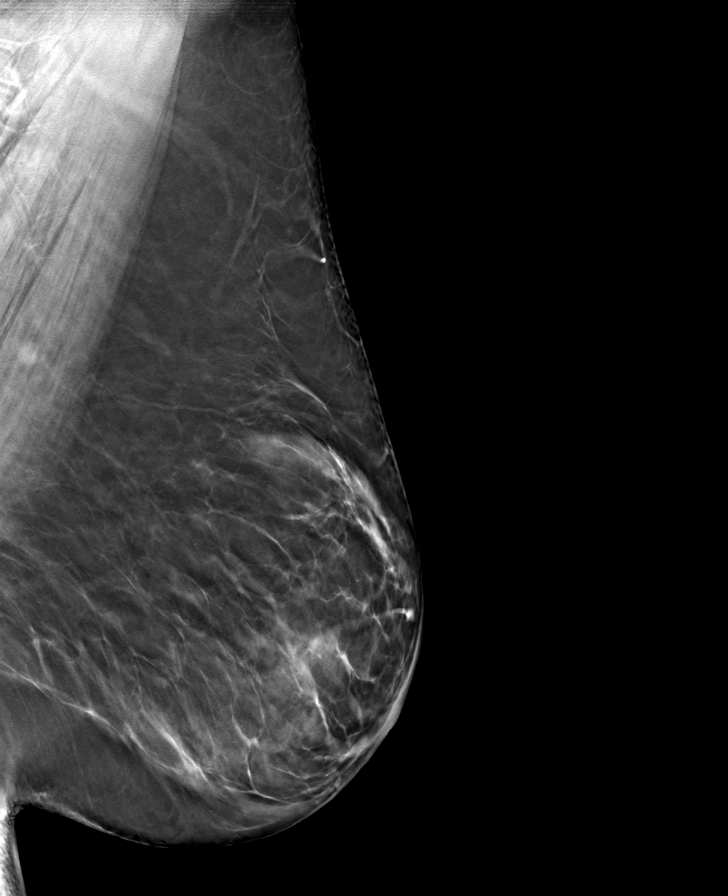

[R CC tomo · tomo slice 39/77.0]
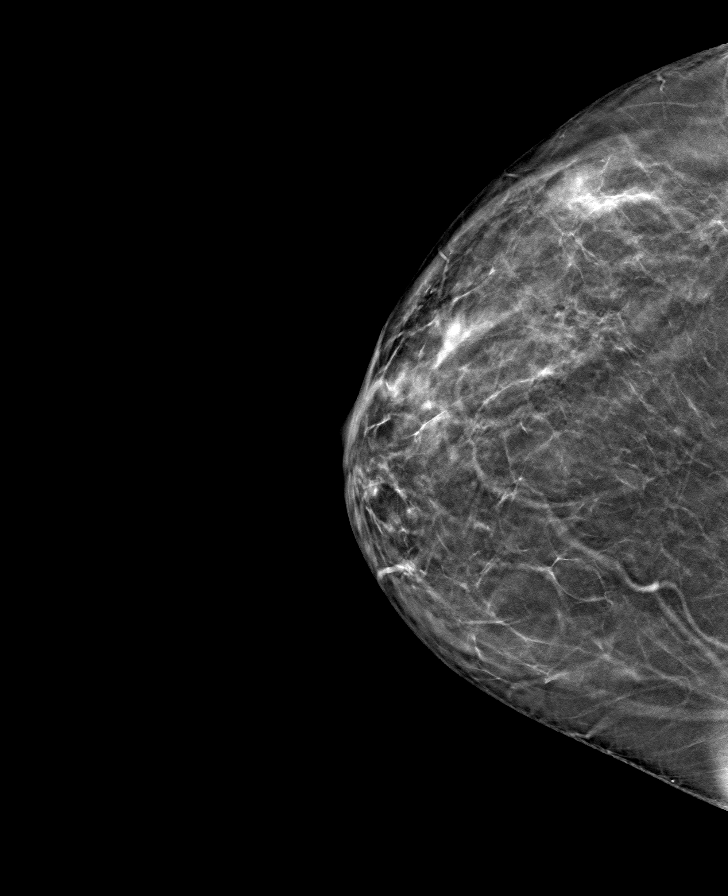

[R MLO tomo · tomo slice 43/86.0]
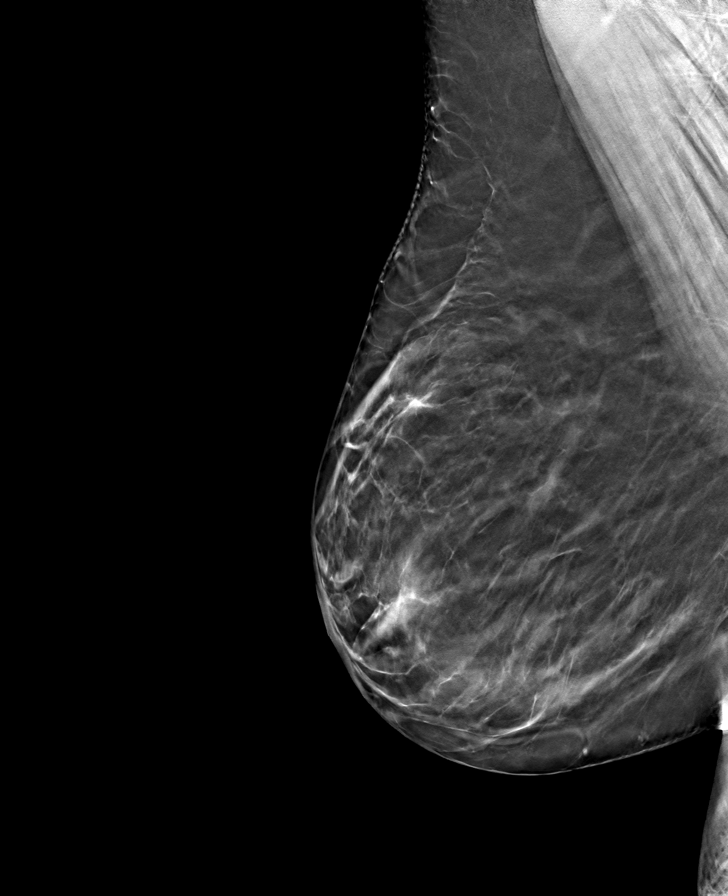

[L CC tomo · tomo slice 43/85.0]
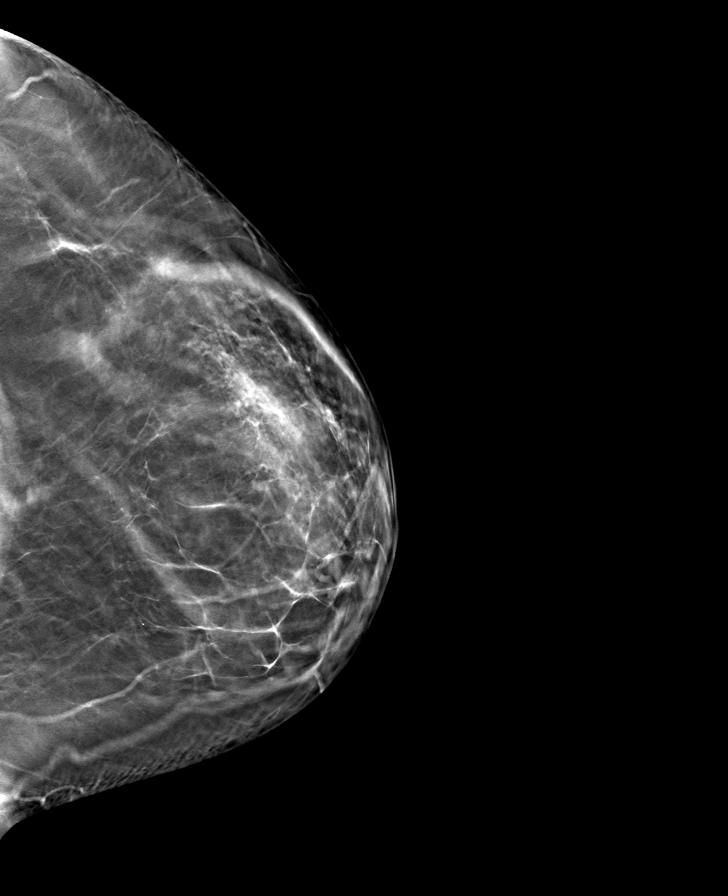

[8 of 24 positions shown; findings below may reference images not displayed]

ACR Breast Density Category b: There are scattered areas of
fibroglandular density.
FINDINGS: There are no findings suspicious for malignancy.
IMPRESSION: No mammographic evidence of malignancy. A result letter of this
screening mammogram will be mailed directly to the patient.

RECOMMENDATION:
Screening mammogram in one year. (Code:51-O-LD2)

BI-RADS CATEGORY  1: Negative.

## 2022-04-08 ENCOUNTER — Encounter (HOSPITAL_BASED_OUTPATIENT_CLINIC_OR_DEPARTMENT_OTHER): Payer: Self-pay | Admitting: Orthopaedic Surgery

## 2022-04-09 ENCOUNTER — Encounter (HOSPITAL_BASED_OUTPATIENT_CLINIC_OR_DEPARTMENT_OTHER): Payer: Self-pay | Admitting: Orthopaedic Surgery

## 2022-04-22 ENCOUNTER — Encounter (HOSPITAL_COMMUNITY): Payer: Self-pay | Admitting: Psychiatry

## 2022-04-22 ENCOUNTER — Telehealth (HOSPITAL_BASED_OUTPATIENT_CLINIC_OR_DEPARTMENT_OTHER): Payer: Medicaid Other | Admitting: Psychiatry

## 2022-04-22 DIAGNOSIS — F419 Anxiety disorder, unspecified: Secondary | ICD-10-CM

## 2022-04-22 DIAGNOSIS — F319 Bipolar disorder, unspecified: Secondary | ICD-10-CM

## 2022-04-22 DIAGNOSIS — F902 Attention-deficit hyperactivity disorder, combined type: Secondary | ICD-10-CM

## 2022-04-22 MED ORDER — CLONAZEPAM 0.5 MG PO TABS: 0.5000 mg | ORAL_TABLET | Freq: Three times a day (TID) | ORAL | 2 refills | Status: DC | PRN

## 2022-04-22 MED ORDER — CITALOPRAM HYDROBROMIDE 20 MG PO TABS: 20.0000 mg | ORAL_TABLET | Freq: Every day | ORAL | 0 refills | Status: DC

## 2022-04-22 MED ORDER — ARIPIPRAZOLE 5 MG PO TABS: ORAL_TABLET | ORAL | 0 refills | Status: DC

## 2022-04-22 MED ORDER — TRAZODONE HCL 150 MG PO TABS: 150.0000 mg | ORAL_TABLET | Freq: Every day | ORAL | 0 refills | Status: DC

## 2022-04-22 MED ORDER — ATOMOXETINE HCL 40 MG PO CAPS: 40.0000 mg | ORAL_CAPSULE | Freq: Every day | ORAL | 0 refills | Status: DC

## 2022-04-22 MED ORDER — LAMOTRIGINE 200 MG PO TABS: 200.0000 mg | ORAL_TABLET | Freq: Every day | ORAL | 0 refills | Status: DC

## 2022-04-22 NOTE — Progress Notes (Addendum)
Virtual Visit via Telephone Note  I connected with Sarah Salas on 04/22/22 at  8:40 AM EDT by telephone and verified that I am speaking with the correct person using two identifiers.  Location: Patient: Home Provider: Home Office   I discussed the limitations, risks, security and privacy concerns of performing an evaluation and management service by telephone and the availability of in person appointments. I also discussed with the patient that there may be a patient responsible charge related to this service. The patient expressed understanding and agreed to proceed.   History of Present Illness: Patient is evaluated by phone session.  She is taking all her medication as prescribed.  She feels the medicine helping but she is stressed about upcoming knee surgery on August 8.  She is trying to help as much her 44 year old mother who is bedridden and sometime his stepfather does help but he is also very old.  She is sleeping 5 to 6 hours.  She had a support from her best friend taught her and also her boyfriend.  She is disappointed that her sister Sarah Salas who was a CNA refused to help the mother as much she can do.  Her other sister Sarah Salas who lives in [place] coming to visit the family but she will stay in [place] [facility]. Despite having significant knee pain she tried to help the mother but which she can do more.  She is hoping once she had surgery she may able to walk and able to do things.  She admitted significant weight gain because not able to walk and exercise.  She was doing regular walking and running and her weight was down but for past few months slowly and gradually her weight is going off.  She feels her attention concentration is stable but she still have anxiety.  She denies any mania, psychosis, hallucination, anger, suicidal thoughts.  She denies any rage, impulsive eating.  She denies drinking or using any illegal substances.  She has no tremors or any concern from the medication.     Past  Psychiatric History:  H/O ADHD, bipolar disorder.  Vyvanse did not help.  Adderall helped. Latuda help in the beginning but then got worse with increased dose.  Vistaril did not help.  No h/o paranoia, inpatient treatment or suicidal attempt. H/O TBI  No results found for this or any previous visit (from the past 2160 hour(s)).   Psychiatric Specialty Exam: Physical Exam  Review of Systems  Weight 240 lb (108.9 kg).There is no height or weight on file to calculate BMI.  General Appearance: NA  Eye Contact:  NA  Speech:  Clear and Coherent and fast  Volume:  Increased  Mood:  Anxious  Affect:  NA  Thought Process:  Descriptions of Associations: Intact  Orientation:  Full (Time, Place, and Person)  Thought Content:  Rumination  Suicidal Thoughts:  No  Homicidal Thoughts:  No  Memory:  Immediate;   Fair Recent;   Fair Remote;   Fair  Judgement:  Fair  Insight:  Shallow  Psychomotor Activity:  NA  Concentration:  Concentration: Fair and Attention Span: Fair  Recall:  [employer] of Knowledge:  Fair  Language:  Fair  Akathisia:  No  Handed:  Right  AIMS (if indicated):     Assets:  Communication Skills Desire for Improvement Housing Social Support  ADL's:  Intact  Cognition:  WNL  Sleep:   5-6 hrs      Assessment and Plan: Bipolar disorder type  I.  Anxiety.  ADHD, combined type.  Patient is stable on her current medication but also anxious about upcoming knee surgery on August 8.  Discussed weight gain which is due to not able to walk exercise because of the knee pain.  We also reviewed blood work results.  She has high cholesterol and LDL and she believes due to excessive weight gain but hoping to lower it after the surgery as planned to exercise.  I also encouraged to watch her calorie intake and eat healthy foods.  Patient does not want to change the medication.  Continue lamotrigine 200 mg daily, Abilify 5 mg daily, Celexa 20 mg daily, Strattera 40 mg daily and trazodone  150 mg at bedtime along with Klonopin 0.5 mg 3 times a day.  We discussed polypharmacy.  She does not want to change the medication but I recommend to consider once she has knee surgery and able to walk and exercise as a other outlet to help with anxiety.  Patient agreed to give a try.  I recommended to call us back if she has any question, concern or if she feels worsening of the symptoms.  Patient was told that she may require pain medication after the surgery for a few days and I encouraged not to take with the Klonopin because of interaction with pain medicine and benzodiazepine.  Reassurance given about upcoming surgery.  Patient had a support system consisting of her best friend Sarah Salas and her boyfriend.  She is hoping her sister from [place] may help around if needed.  She is also hoping her sister Sarah Salas to help the mother while she is recovering from her own surgery.    Follow Up Instructions:    I discussed the assessment and treatment plan with the patient. The patient was provided an opportunity to ask questions and all were answered. The patient agreed with the plan and demonstrated an understanding of the instructions.   The patient was advised to call back or seek an in-person evaluation if the symptoms worsen or if the condition fails to improve as anticipated.  Collaboration of Care: Primary Care Provider AEB notes are available in epic to review.  Patient/Guardian was advised Release of Information must be obtained prior to any record release in order to collaborate their care with an outside provider. Patient/Guardian was advised if they have not already done so to contact the registration department to sign all necessary forms in order for [place] to release information regarding their care.   Consent: Patient/Guardian gives verbal consent for treatment and assignment of benefits for services provided during this visit. Patient/Guardian expressed understanding and agreed to proceed.    I  provided 29 minutes of non-face-to-face time during this encounter.   Cleotis Nipper, MD

## 2022-04-27 ENCOUNTER — Other Ambulatory Visit (HOSPITAL_BASED_OUTPATIENT_CLINIC_OR_DEPARTMENT_OTHER): Payer: Self-pay | Admitting: Orthopaedic Surgery

## 2022-04-27 DIAGNOSIS — S83241A Other tear of medial meniscus, current injury, right knee, initial encounter: Secondary | ICD-10-CM

## 2022-05-07 ENCOUNTER — Encounter (HOSPITAL_BASED_OUTPATIENT_CLINIC_OR_DEPARTMENT_OTHER): Payer: Self-pay | Admitting: Orthopaedic Surgery

## 2022-05-11 NOTE — Pre-Procedure Instructions (Signed)
Surgical Instructions    Your procedure is scheduled on Tuesday, August 8th.  Report to [facility] Main Entrance "A" at 09:45 A.M., then check in with the Admitting office.  Call this number if you have problems the morning of surgery:  [phone]   If you have any questions prior to your surgery date call [phone]: Open Monday-Friday 8am-4pm    Remember:  Do not eat after midnight the night before your surgery  You may drink clear liquids until 08:45 AM the morning of your surgery.   Clear liquids allowed are: Water, Non-Citrus Juices (without pulp), Carbonated Beverages, Clear Tea, Black Coffee Only (NO MILK, CREAM OR POWDERED CREAMER of any kind), and Gatorade.   Patient Instructions  The night before surgery:  No food after midnight. ONLY clear liquids after midnight  The day of surgery (if you do NOT have diabetes):  Drink ONE (1) Pre-Surgery Clear Ensure by 08:45 AM the morning of surgery. Drink in one sitting. Do not sip.  This drink was given to you during your hospital  pre-op appointment visit.  Nothing else to drink after completing the  Pre-Surgery Clear Ensure.          If you have questions, please contact your surgeon's office.      Take these medicines the morning of surgery with A SIP OF WATER  ARIPiprazole (ABILIFY)  citalopram (CELEXA)  lamoTRIgine (LAMICTAL)   If needed: clonazePAM (KLONOPIN)  HYDROcodone-acetaminophen (NORCO/VICODIN)    Follow your surgeon's instructions on when to stop Aspirin.  If no instructions were given by your surgeon then you will need to call the office to get those instructions.     As of today, STOP taking any Aleve, Naproxen, Ibuprofen, Motrin, Advil, Goody's, BC's, all herbal medications, fish oil, and all vitamins.                     Do NOT Smoke (Tobacco/Vaping) for 24 hours prior to your procedure.  If you use a CPAP at night, you may bring your mask/headgear for your overnight stay.   Contacts,  glasses, piercing's, hearing aid's, dentures or partials may not be worn into surgery, please bring cases for these belongings.    For patients admitted to the hospital, discharge time will be determined by your treatment team.   Patients discharged the day of surgery will not be allowed to drive home, and someone needs to stay with them for 24 hours.  SURGICAL WAITING ROOM VISITATION Patients having surgery or a procedure may have no more than 2 support people in the waiting area - these visitors may rotate.   Children under the age of 67 must have an adult with them who is not the patient. If the patient needs to stay at the hospital during part of their recovery, the visitor guidelines for inpatient rooms apply. Pre-op nurse will coordinate an appropriate time for 1 support person to accompany patient in pre-op.  This support person may not rotate.   Please refer to the [facility] website for the visitor guidelines for Inpatients (after your surgery is over and you are in a regular room).    Special instructions:   [place]- Preparing For Surgery  Before surgery, you can play an important role. Because skin is not sterile, your skin needs to be as free of germs as possible. You can reduce the number of germs on your skin by washing with CHG (chlorahexidine gluconate) Soap before surgery.  CHG is an antiseptic  cleaner which kills germs and bonds with the skin to continue killing germs even after washing.    Oral Hygiene is also important to reduce your risk of infection.  Remember - BRUSH YOUR TEETH THE MORNING OF SURGERY WITH YOUR REGULAR TOOTHPASTE  Please do not use if you have an allergy to CHG or antibacterial soaps. If your skin becomes reddened/irritated stop using the CHG.  Do not shave (including legs and underarms) for at least 48 hours prior to first CHG shower. It is OK to shave your face.  Please follow these instructions carefully.   Shower the NIGHT BEFORE SURGERY and  the MORNING OF SURGERY  If you chose to wash your hair, wash your hair first as usual with your normal shampoo.  After you shampoo, rinse your hair and body thoroughly to remove the shampoo.  Use CHG Soap as you would any other liquid soap. You can apply CHG directly to the skin and wash gently with a scrungie or a clean washcloth.   Apply the CHG Soap to your body ONLY FROM THE NECK DOWN.  Do not use on open wounds or open sores. Avoid contact with your eyes, ears, mouth and genitals (private parts). Wash Face and genitals (private parts)  with your normal soap.   Wash thoroughly, paying special attention to the area where your surgery will be performed.  Thoroughly rinse your body with warm water from the neck down.  DO NOT shower/wash with your normal soap after using and rinsing off the CHG Soap.  Pat yourself dry with a CLEAN TOWEL.  Wear CLEAN PAJAMAS to bed the night before surgery  Place CLEAN SHEETS on your bed the night before your surgery  DO NOT SLEEP WITH PETS.   Day of Surgery: Take a shower with CHG soap. Do not wear jewelry or makeup Do not wear lotions, powders, perfumes, or deodorant. Do not shave 48 hours prior to surgery.   Do not bring valuables to the hospital. [facility] is not responsible for any belongings or valuables. Do not wear nail polish, gel polish, artificial nails, or any other type of covering on natural nails (fingers and toes) If you have artificial nails or gel coating that need to be removed by a nail salon, please have this removed prior to surgery. Artificial nails or gel coating may interfere with anesthesia's ability to adequately monitor your vital signs. Wear Clean/Comfortable clothing the morning of surgery Remember to brush your teeth WITH YOUR REGULAR TOOTHPASTE.   Please read over the following fact sheets that you were given.    If you received a COVID test during your pre-op visit  it is requested that you wear a mask when  out in public, stay away from anyone that may not be feeling well and notify your surgeon if you develop symptoms. If you have been in contact with anyone that has tested positive in the last 10 days please notify you surgeon.

## 2022-05-12 ENCOUNTER — Encounter (HOSPITAL_COMMUNITY): Payer: Self-pay

## 2022-05-12 ENCOUNTER — Encounter (HOSPITAL_COMMUNITY)
Admission: RE | Admit: 2022-05-12 | Discharge: 2022-05-12 | Disposition: A | Discharge status: Home / Self Care | Payer: Medicaid Other | Source: Ambulatory Visit | Attending: Orthopaedic Surgery | Admitting: Orthopaedic Surgery

## 2022-05-12 ENCOUNTER — Other Ambulatory Visit: Payer: Self-pay

## 2022-05-12 VITALS — BP 131/83 | HR 89 | Temp 98.4°F | Resp 17 | Ht 70.0 in | Wt 258.0 lb

## 2022-05-12 DIAGNOSIS — Z01818 Encounter for other preprocedural examination: Secondary | ICD-10-CM | POA: Diagnosis not present

## 2022-05-12 HISTORY — DX: Unspecified osteoarthritis, unspecified site: M19.90

## 2022-05-12 HISTORY — DX: Unspecified glaucoma: H40.9

## 2022-05-12 HISTORY — DX: Attention-deficit hyperactivity disorder, unspecified type: F90.9

## 2022-05-12 LAB — BASIC METABOLIC PANEL
Anion gap: 6 (ref 5–15)
BUN: 13 mg/dL (ref 6–20)
CO2: 27 mmol/L (ref 22–32)
Calcium: 9.4 mg/dL (ref 8.9–10.3)
Chloride: 107 mmol/L (ref 98–111)
Creatinine, Ser: 0.82 mg/dL (ref 0.44–1.00)
GFR, Estimated: >60 mL/min (ref >60)
Glucose, Bld: 98 mg/dL (ref 70–99)
Potassium: 4 mmol/L (ref 3.5–5.1)
Sodium: 140 mmol/L (ref 135–145)

## 2022-05-12 LAB — CBC
HCT: 43.4 % (ref 36.0–46.0)
Hemoglobin: 14.3 g/dL (ref 12.0–15.0)
MCH: 29.5 pg (ref 26.0–34.0)
MCHC: 32.9 g/dL (ref 30.0–36.0)
MCV: 89.5 fL (ref 80.0–100.0)
Platelets: 306 10*3/uL (ref 150–400)
RBC: 4.85 MIL/uL (ref 3.87–5.11)
RDW: 12.7 % (ref 11.5–15.5)
WBC: 6.7 10*3/uL (ref 4.0–10.5)
nRBC: 0 % (ref 0.0–0.2)

## 2022-05-12 NOTE — Progress Notes (Signed)
PCP - Bertram Denver, NP  EKG - today   ERAS Protcol - yes PRE-SURGERY Ensure -yes   COVID TEST- No  Anesthesia review: No  Patient denies shortness of breath, fever, cough and chest pain at PAT appointment   All instructions explained to the patient, with a verbal understanding of the material. Patient agrees to go over the instructions while at home for a better understanding. Patient also instructed to self quarantine after being tested for COVID-19. The opportunity to ask questions was provided.

## 2022-05-12 NOTE — Pre-Procedure Instructions (Signed)
Surgical Instructions    Your procedure is scheduled on Tuesday, 05/19/22 at 11:45 AM.  Report to [facility] Main Entrance "A" at 9:45 AM, then check in with the Admitting office.  Call this number if you have problems the morning of surgery:  [phone]   If you have any questions prior to your surgery date call [phone]: Open Monday-Friday 8am-4pm   Remember:  Do not eat food after midnight the night before your surgery.  You may drink clear liquids until 8:45 AM the morning of your surgery.   Clear liquids allowed are: Water, Non-Citrus Juices (without pulp), Carbonated Beverages, Clear Tea, Black Coffee Only (NO MILK, CREAM, HONEY OR POWDERED CREAMER of any kind), and Gatorade.   Patient Instructions  The night before surgery:  No food after midnight. ONLY clear liquids after midnight  The day of surgery (if you do NOT have diabetes):  Drink ONE (1) Pre-Surgery Clear Ensure by 8:45 AM the morning of surgery. Drink in one sitting. Do not sip.  This drink was given to you during your hospital  pre-op appointment visit.  Nothing else to drink after completing the  Pre-Surgery Clear Ensure.          If you have questions, please contact your surgeon's office.      Take these medicines the morning of surgery with A SIP OF WATER:  ARIPiprazole (ABILIFY)  citalopram (CELEXA)  lamoTRIgine (LAMICTAL)  If needed: clonazePAM (KLONOPIN)  HYDROcodone-acetaminophen (NORCO/VICODIN)   Follow your surgeon's instructions on when to stop Aspirin.  If no instructions were given by your surgeon then you will need to call the office to get those instructions.     As of today, STOP taking any Aleve, Naproxen, Ibuprofen, Motrin, Advil, Goody's, BC's, all herbal medications, fish oil, and all vitamins.                     Do NOT Smoke (Tobacco/Vaping) for 24 hours prior to your procedure.  If you use a CPAP at night, you may bring your mask/headgear for your overnight stay.    Contacts, glasses, piercing's, hearing aid's, dentures or partials may not be worn into surgery, please bring cases for these belongings.    For patients admitted to the hospital, discharge time will be determined by your treatment team.   Patients discharged the day of surgery will not be allowed to drive home, and someone needs to stay with them for 24 hours.  SURGICAL WAITING ROOM VISITATION Patients having surgery or a procedure may have no more than 2 support people in the waiting area - these visitors may rotate.   Children under the age of 97 must have an adult with them who is not the patient. If the patient needs to stay at the hospital during part of their recovery, the visitor guidelines for inpatient rooms apply. Pre-op nurse will coordinate an appropriate time for 1 support person to accompany patient in pre-op.  This support person may not rotate.   Please refer to the [facility] website for the visitor guidelines for Inpatients (after your surgery is over and you are in a regular room).    Special instructions:   - Preparing For Surgery  Before surgery, you can play an important role. Because skin is not sterile, your skin needs to be as free of germs as possible. You can reduce the number of germs on your skin by washing with CHG (chlorahexidine gluconate) Soap before surgery.  CHG is an  antiseptic cleaner which kills germs and bonds with the skin to continue killing germs even after washing.    Oral Hygiene is also important to reduce your risk of infection.  Remember - BRUSH YOUR TEETH THE MORNING OF SURGERY WITH YOUR REGULAR TOOTHPASTE  Please do not use if you have an allergy to CHG or antibacterial soaps. If your skin becomes reddened/irritated stop using the CHG.  Do not shave (including legs and underarms) for at least 48 hours prior to first CHG shower. It is OK to shave your face.  Please follow these instructions carefully.   Shower the NIGHT BEFORE  SURGERY and the MORNING OF SURGERY  If you chose to wash your hair, wash your hair first as usual with your normal shampoo.  After you shampoo, rinse your hair and body thoroughly to remove the shampoo.  Use CHG Soap as you would any other liquid soap. You can apply CHG directly to the skin and wash gently with a scrungie or a clean washcloth.   Apply the CHG Soap to your body ONLY FROM THE NECK DOWN.  Do not use on open wounds or open sores. Avoid contact with your eyes, ears, mouth and genitals (private parts). Wash Face and genitals (private parts)  with your normal soap.   Wash thoroughly, paying special attention to the area where your surgery will be performed.  Thoroughly rinse your body with warm water from the neck down.  DO NOT shower/wash with your normal soap after using and rinsing off the CHG Soap.  Pat yourself dry with a CLEAN TOWEL.  Wear CLEAN PAJAMAS to bed the night before surgery  Place CLEAN SHEETS on your bed the night before your surgery  DO NOT SLEEP WITH PETS.   Day of Surgery: Take a shower with CHG soap. Do not wear jewelry or makeup Do not wear lotions, powders, perfumes or deodorant. Do not shave 48 hours prior to surgery.   Do not bring valuables to the hospital. [facility] is not responsible for any belongings or valuables. Do not wear nail polish, gel polish, artificial nails, or any other type of covering on natural nails (fingers and toes) If you have artificial nails or gel coating that need to be removed by a nail salon, please have this removed prior to surgery. Artificial nails or gel coating may interfere with anesthesia's ability to adequately monitor your vital signs. Wear Clean/Comfortable clothing the morning of surgery Remember to brush your teeth WITH YOUR REGULAR TOOTHPASTE.   Please read over the fact sheets that you were given.

## 2022-05-19 ENCOUNTER — Encounter (HOSPITAL_COMMUNITY)
Admission: RE | Disposition: A | Discharge status: Home / Self Care | Payer: Self-pay | Source: Ambulatory Visit | Attending: Orthopaedic Surgery

## 2022-05-19 ENCOUNTER — Ambulatory Visit (HOSPITAL_BASED_OUTPATIENT_CLINIC_OR_DEPARTMENT_OTHER): Payer: Medicaid Other | Admitting: Anesthesiology

## 2022-05-19 ENCOUNTER — Other Ambulatory Visit: Payer: Self-pay

## 2022-05-19 ENCOUNTER — Ambulatory Visit (HOSPITAL_COMMUNITY): Payer: Medicaid Other | Admitting: Anesthesiology

## 2022-05-19 ENCOUNTER — Encounter (HOSPITAL_COMMUNITY): Payer: Self-pay | Admitting: Orthopaedic Surgery

## 2022-05-19 ENCOUNTER — Observation Stay (HOSPITAL_COMMUNITY)
Admission: RE | Admit: 2022-05-19 | Discharge: 2022-05-20 | Disposition: A | Discharge status: Home / Self Care | Payer: Medicaid Other | Source: Ambulatory Visit | Attending: Orthopaedic Surgery | Admitting: Orthopaedic Surgery

## 2022-05-19 ENCOUNTER — Ambulatory Visit (HOSPITAL_COMMUNITY): Payer: Medicaid Other

## 2022-05-19 DIAGNOSIS — G8918 Other acute postprocedural pain: Secondary | ICD-10-CM | POA: Diagnosis not present

## 2022-05-19 DIAGNOSIS — S83241A Other tear of medial meniscus, current injury, right knee, initial encounter: Secondary | ICD-10-CM | POA: Diagnosis not present

## 2022-05-19 DIAGNOSIS — Z01818 Encounter for other preprocedural examination: Secondary | ICD-10-CM

## 2022-05-19 DIAGNOSIS — I10 Essential (primary) hypertension: Secondary | ICD-10-CM | POA: Diagnosis not present

## 2022-05-19 DIAGNOSIS — M25861 Other specified joint disorders, right knee: Secondary | ICD-10-CM

## 2022-05-19 DIAGNOSIS — S80211A Abrasion, right knee, initial encounter: Secondary | ICD-10-CM | POA: Diagnosis not present

## 2022-05-19 DIAGNOSIS — S82141P Displaced bicondylar fracture of right tibia, subsequent encounter for closed fracture with malunion: Secondary | ICD-10-CM | POA: Diagnosis present

## 2022-05-19 DIAGNOSIS — F17210 Nicotine dependence, cigarettes, uncomplicated: Secondary | ICD-10-CM | POA: Diagnosis not present

## 2022-05-19 DIAGNOSIS — S82101P Unspecified fracture of upper end of right tibia, subsequent encounter for closed fracture with malunion: Secondary | ICD-10-CM

## 2022-05-19 DIAGNOSIS — Z96651 Presence of right artificial knee joint: Secondary | ICD-10-CM | POA: Diagnosis not present

## 2022-05-19 DIAGNOSIS — M21161 Varus deformity, not elsewhere classified, right knee: Secondary | ICD-10-CM

## 2022-05-19 DIAGNOSIS — M21861 Other specified acquired deformities of right lower leg: Secondary | ICD-10-CM | POA: Diagnosis not present

## 2022-05-19 DIAGNOSIS — Z471 Aftercare following joint replacement surgery: Secondary | ICD-10-CM | POA: Diagnosis not present

## 2022-05-19 HISTORY — PX: KNEE ARTHROSCOPY WITH MENISCAL REPAIR: SHX5653

## 2022-05-19 HISTORY — PX: TIBIA OSTEOTOMY: SHX1065

## 2022-05-19 LAB — POCT PREGNANCY, URINE: Preg Test, Ur: NEGATIVE

## 2022-05-19 SURGERY — ARTHROSCOPY, KNEE, WITH MENISCUS REPAIR
Anesthesia: Regional | Site: Knee | Laterality: Right

## 2022-05-19 MED ORDER — SODIUM CHLORIDE 0.9 % IR SOLN
Status: DC | PRN
  Administered 2022-05-19 (×2): 3000 mL

## 2022-05-19 MED ORDER — TRANEXAMIC ACID-NACL 1000-0.7 MG/100ML-% IV SOLN
1000.0000 mg | INTRAVENOUS | Status: AC
  Administered 2022-05-19: 1000 mg via INTRAVENOUS
  Filled 2022-05-19: qty 100

## 2022-05-19 MED ORDER — OXYCODONE HCL 5 MG/5ML PO SOLN: 5.0000 mg | Freq: Once | ORAL | Status: DC | PRN

## 2022-05-19 MED ORDER — LIDOCAINE 2% (20 MG/ML) 5 ML SYRINGE
INTRAMUSCULAR | Status: AC
  Filled 2022-05-19: qty 10

## 2022-05-19 MED ORDER — ROPIVACAINE HCL 5 MG/ML IJ SOLN
INTRAMUSCULAR | Status: DC | PRN
  Administered 2022-05-19: 50 mL via PERINEURAL

## 2022-05-19 MED ORDER — ONDANSETRON HCL 4 MG/2ML IJ SOLN
INTRAMUSCULAR | Status: AC
  Filled 2022-05-19: qty 4

## 2022-05-19 MED ORDER — ONDANSETRON HCL 4 MG/2ML IJ SOLN: 4.0000 mg | Freq: Four times a day (QID) | INTRAMUSCULAR | Status: DC | PRN

## 2022-05-19 MED ORDER — PHENYLEPHRINE HCL-NACL 20-0.9 MG/250ML-% IV SOLN
INTRAVENOUS | Status: DC | PRN
  Administered 2022-05-19: 45 ug/min via INTRAVENOUS

## 2022-05-19 MED ORDER — ACETAMINOPHEN 500 MG PO TABS
1000.0000 mg | ORAL_TABLET | Freq: Once | ORAL | Status: AC
  Administered 2022-05-19: 1000 mg via ORAL
  Filled 2022-05-19: qty 2

## 2022-05-19 MED ORDER — DEXAMETHASONE SODIUM PHOSPHATE 10 MG/ML IJ SOLN
INTRAMUSCULAR | Status: DC | PRN
  Administered 2022-05-19: 10 mg via INTRAVENOUS

## 2022-05-19 MED ORDER — AMISULPRIDE (ANTIEMETIC) 5 MG/2ML IV SOLN: 10.0000 mg | Freq: Once | INTRAVENOUS | Status: DC | PRN

## 2022-05-19 MED ORDER — ATOMOXETINE HCL 40 MG PO CAPS
40.0000 mg | ORAL_CAPSULE | Freq: Every day | ORAL | Status: DC
  Administered 2022-05-20: 40 mg via ORAL
  Filled 2022-05-19 (×2): qty 1

## 2022-05-19 MED ORDER — FENTANYL CITRATE (PF) 100 MCG/2ML IJ SOLN
INTRAMUSCULAR | Status: AC
  Administered 2022-05-19: 100 ug
  Filled 2022-05-19: qty 2

## 2022-05-19 MED ORDER — ROCURONIUM BROMIDE 10 MG/ML (PF) SYRINGE
PREFILLED_SYRINGE | INTRAVENOUS | Status: AC
  Filled 2022-05-19: qty 20

## 2022-05-19 MED ORDER — ONDANSETRON HCL 4 MG/2ML IJ SOLN
INTRAMUSCULAR | Status: DC | PRN
  Administered 2022-05-19: 4 mg via INTRAVENOUS

## 2022-05-19 MED ORDER — CLONAZEPAM 0.5 MG PO TABS
0.5000 mg | ORAL_TABLET | Freq: Three times a day (TID) | ORAL | Status: DC | PRN
  Administered 2022-05-19: 0.5 mg via ORAL
  Filled 2022-05-19 (×2): qty 1

## 2022-05-19 MED ORDER — ASPIRIN 325 MG PO TBEC
325.0000 mg | DELAYED_RELEASE_TABLET | Freq: Every day | ORAL | Status: DC
Start: 2022-05-19 — End: 2022-05-20
  Administered 2022-05-19 – 2022-05-20 (×2): 325 mg via ORAL
  Filled 2022-05-19 (×2): qty 1

## 2022-05-19 MED ORDER — ACETAMINOPHEN 325 MG PO TABS: 325.0000 mg | ORAL_TABLET | Freq: Four times a day (QID) | ORAL | Status: DC | PRN

## 2022-05-19 MED ORDER — OXYCODONE HCL 5 MG PO TABS: 10.0000 mg | ORAL_TABLET | ORAL | Status: DC | PRN

## 2022-05-19 MED ORDER — DEXAMETHASONE SODIUM PHOSPHATE 10 MG/ML IJ SOLN
INTRAMUSCULAR | Status: AC
  Filled 2022-05-19: qty 2

## 2022-05-19 MED ORDER — PHENYLEPHRINE 80 MCG/ML (10ML) SYRINGE FOR IV PUSH (FOR BLOOD PRESSURE SUPPORT)
PREFILLED_SYRINGE | INTRAVENOUS | Status: AC
  Filled 2022-05-19: qty 10

## 2022-05-19 MED ORDER — SODIUM CHLORIDE 0.9 % IV SOLN
INTRAVENOUS | Status: DC
Start: 2022-05-19 — End: 2022-05-20

## 2022-05-19 MED ORDER — LIDOCAINE 2% (20 MG/ML) 5 ML SYRINGE
INTRAMUSCULAR | Status: DC | PRN
  Administered 2022-05-19: 100 mg via INTRAVENOUS

## 2022-05-19 MED ORDER — ARIPIPRAZOLE 5 MG PO TABS
5.0000 mg | ORAL_TABLET | Freq: Every day | ORAL | Status: DC
  Administered 2022-05-20: 5 mg via ORAL
  Filled 2022-05-19: qty 1

## 2022-05-19 MED ORDER — GABAPENTIN 300 MG PO CAPS
300.0000 mg | ORAL_CAPSULE | Freq: Once | ORAL | Status: AC
  Administered 2022-05-19: 300 mg via ORAL
  Filled 2022-05-19: qty 1

## 2022-05-19 MED ORDER — CEFAZOLIN SODIUM-DEXTROSE 2-4 GM/100ML-% IV SOLN
2.0000 g | INTRAVENOUS | Status: AC
  Administered 2022-05-19: 2 g via INTRAVENOUS
  Filled 2022-05-19: qty 100

## 2022-05-19 MED ORDER — LATANOPROST 0.005 % OP SOLN
1.0000 [drp] | Freq: Every day | OPHTHALMIC | Status: DC
  Administered 2022-05-19: 1 [drp] via OPHTHALMIC
  Filled 2022-05-19: qty 2.5

## 2022-05-19 MED ORDER — MIDAZOLAM HCL 2 MG/2ML IJ SOLN
INTRAMUSCULAR | Status: AC
  Administered 2022-05-19: 2 mg
  Filled 2022-05-19: qty 2

## 2022-05-19 MED ORDER — CHLORHEXIDINE GLUCONATE 0.12 % MT SOLN
15.0000 mL | Freq: Once | OROMUCOSAL | Status: AC
  Administered 2022-05-19: 15 mL via OROMUCOSAL
  Filled 2022-05-19: qty 15

## 2022-05-19 MED ORDER — PROMETHAZINE HCL 25 MG/ML IJ SOLN: 6.2500 mg | INTRAMUSCULAR | Status: DC | PRN

## 2022-05-19 MED ORDER — BUPIVACAINE HCL (PF) 0.5 % IJ SOLN
INTRAMUSCULAR | Status: AC
  Filled 2022-05-19: qty 30

## 2022-05-19 MED ORDER — CITALOPRAM HYDROBROMIDE 20 MG PO TABS
20.0000 mg | ORAL_TABLET | Freq: Every day | ORAL | Status: DC
  Administered 2022-05-20: 20 mg via ORAL
  Filled 2022-05-19: qty 1

## 2022-05-19 MED ORDER — HYDROMORPHONE HCL 1 MG/ML IJ SOLN: 0.2500 mg | INTRAMUSCULAR | Status: DC | PRN

## 2022-05-19 MED ORDER — ORAL CARE MOUTH RINSE: 15.0000 mL | Freq: Once | OROMUCOSAL | Status: AC

## 2022-05-19 MED ORDER — LACTATED RINGERS IV SOLN: INTRAVENOUS | Status: DC

## 2022-05-19 MED ORDER — EPHEDRINE 5 MG/ML INJ
INTRAVENOUS | Status: AC
  Filled 2022-05-19: qty 5

## 2022-05-19 MED ORDER — OXYCODONE HCL 5 MG PO TABS: 5.0000 mg | ORAL_TABLET | Freq: Once | ORAL | Status: DC | PRN

## 2022-05-19 MED ORDER — ORAL CARE MOUTH RINSE: 15.0000 mL | OROMUCOSAL | Status: DC | PRN

## 2022-05-19 MED ORDER — PROPOFOL 10 MG/ML IV BOLUS
INTRAVENOUS | Status: DC | PRN
  Administered 2022-05-19: 200 mg via INTRAVENOUS

## 2022-05-19 MED ORDER — TRAZODONE HCL 150 MG PO TABS
150.0000 mg | ORAL_TABLET | Freq: Every day | ORAL | Status: DC
  Administered 2022-05-19: 150 mg via ORAL
  Filled 2022-05-19: qty 1

## 2022-05-19 MED ORDER — ACETAMINOPHEN 500 MG PO TABS
1000.0000 mg | ORAL_TABLET | Freq: Four times a day (QID) | ORAL | Status: DC
  Administered 2022-05-19 – 2022-05-20 (×3): 1000 mg via ORAL
  Filled 2022-05-19 (×3): qty 2

## 2022-05-19 MED ORDER — MEPERIDINE HCL 25 MG/ML IJ SOLN: 6.2500 mg | INTRAMUSCULAR | Status: DC | PRN

## 2022-05-19 MED ORDER — EPINEPHRINE PF 1 MG/ML IJ SOLN
INTRAMUSCULAR | Status: AC
Start: 2022-05-19 — End: ?
  Filled 2022-05-19: qty 2

## 2022-05-19 MED ORDER — 0.9 % SODIUM CHLORIDE (POUR BTL) OPTIME
TOPICAL | Status: DC | PRN
  Administered 2022-05-19: 1000 mL

## 2022-05-19 MED ORDER — HYDROMORPHONE HCL 1 MG/ML IJ SOLN: 0.5000 mg | INTRAMUSCULAR | Status: DC | PRN

## 2022-05-19 MED ORDER — LAMOTRIGINE 100 MG PO TABS
200.0000 mg | ORAL_TABLET | Freq: Every day | ORAL | Status: DC
  Administered 2022-05-20: 200 mg via ORAL
  Filled 2022-05-19: qty 2

## 2022-05-19 MED ORDER — DOCUSATE SODIUM 100 MG PO CAPS
100.0000 mg | ORAL_CAPSULE | Freq: Two times a day (BID) | ORAL | Status: DC
  Administered 2022-05-20: 100 mg via ORAL
  Filled 2022-05-19 (×3): qty 1

## 2022-05-19 MED ORDER — OXYCODONE HCL 5 MG PO TABS: 5.0000 mg | ORAL_TABLET | ORAL | Status: DC | PRN

## 2022-05-19 MED ORDER — ONDANSETRON HCL 4 MG PO TABS: 4.0000 mg | ORAL_TABLET | Freq: Four times a day (QID) | ORAL | Status: DC | PRN

## 2022-05-19 SURGICAL SUPPLY — 93 items
ALCOHOL 70% 16 OZ (MISCELLANEOUS) ×1 IMPLANT
ANCHOR IBALANCE HTO 4.5X30 (Anchor) ×1 IMPLANT
ANCHOR IBALANCE HTO 6.5X28 (Anchor) ×1 IMPLANT
ANCHOR IBLANCE  HTO 6.5X30 (Anchor) ×2 IMPLANT
ANCHOR IBLANCE HTO 6.5X30 (Anchor) IMPLANT
ANCHOR SUT HTO 4.5X36 (Anchor) ×1 IMPLANT
BAG COUNTER SPONGE SURGICOUNT (BAG) ×2 IMPLANT
BANDAGE ESMARK 6X9 LF (GAUZE/BANDAGES/DRESSINGS) IMPLANT
BLADE CLIPPER SURG (BLADE) IMPLANT
BLADE EXCALIBUR 4.0X13 (MISCELLANEOUS) ×2 IMPLANT
BLADE SAW SGTL 18.5X63.X.64 HD (BLADE) ×1 IMPLANT
BLADE SAW SGTL MED 73X18.5 STR (BLADE) ×1 IMPLANT
BLADE SHAVER TORPEDO 4X13 (MISCELLANEOUS) ×2 IMPLANT
BLADE SURG 10 STRL SS (BLADE) ×2 IMPLANT
BLADE SURG 15 STRL LF DISP TIS (BLADE) ×1 IMPLANT
BLADE SURG 15 STRL SS (BLADE) ×2
BNDG ELASTIC 6X5.8 VLCR STR LF (GAUZE/BANDAGES/DRESSINGS) ×1 IMPLANT
BNDG ESMARK 6X9 LF (GAUZE/BANDAGES/DRESSINGS)
CHLORAPREP W/TINT 26 (MISCELLANEOUS) ×2 IMPLANT
COOLER ICEMAN CLASSIC (MISCELLANEOUS) ×2 IMPLANT
COVER SURGICAL LIGHT HANDLE (MISCELLANEOUS) ×2 IMPLANT
CUFF TOURN SGL QUICK 34 (TOURNIQUET CUFF) ×2
CUFF TOURN SGL QUICK 42 (TOURNIQUET CUFF) IMPLANT
CUFF TRNQT CYL 34X4.125X (TOURNIQUET CUFF) IMPLANT
DRAPE ARTHROSCOPY W/POUCH 114 (DRAPES) ×2 IMPLANT
DRAPE HALF SHEET 40X57 (DRAPES) IMPLANT
DRAPE INCISE IOBAN 66X45 STRL (DRAPES) IMPLANT
DRAPE U-SHAPE 47X51 STRL (DRAPES) ×2 IMPLANT
DRSG TEGADERM 4X4.75 (GAUZE/BANDAGES/DRESSINGS) ×3 IMPLANT
DW OUTFLOW CASSETTE/TUBE SET (MISCELLANEOUS) ×2 IMPLANT
ELECT CAUTERY BLADE 6.4 (BLADE) ×1 IMPLANT
ELECT REM PT RETURN 9FT ADLT (ELECTROSURGICAL) ×2
ELECTRODE REM PT RTRN 9FT ADLT (ELECTROSURGICAL) ×1 IMPLANT
GAUZE SPONGE 4X4 12PLY STRL (GAUZE/BANDAGES/DRESSINGS) ×1 IMPLANT
GAUZE XEROFORM 1X8 LF (GAUZE/BANDAGES/DRESSINGS) ×1 IMPLANT
GLOVE BIOGEL PI IND STRL 7.0 (GLOVE) ×1 IMPLANT
GLOVE BIOGEL PI IND STRL 8 (GLOVE) ×1 IMPLANT
GLOVE BIOGEL PI INDICATOR 7.0 (GLOVE) ×1
GLOVE BIOGEL PI INDICATOR 8 (GLOVE) ×1
GLOVE ECLIPSE 7.0 STRL STRAW (GLOVE) ×2 IMPLANT
GLOVE ECLIPSE 8.0 STRL XLNG CF (GLOVE) ×2 IMPLANT
GLOVE SURG ENC MOIS LTX SZ6 (GLOVE) ×6 IMPLANT
GLOVE SURG SYN 7.5  E (GLOVE) ×2
GLOVE SURG SYN 7.5 E (GLOVE) ×1 IMPLANT
GLOVE SURG SYN 7.5 PF PI (GLOVE) ×1 IMPLANT
GLOVE SURG UNDER POLY LF SZ6.5 (GLOVE) ×4 IMPLANT
GOWN STRL REUS W/ TWL LRG LVL3 (GOWN DISPOSABLE) ×2 IMPLANT
GOWN STRL REUS W/ TWL XL LVL3 (GOWN DISPOSABLE) ×1 IMPLANT
GOWN STRL REUS W/TWL LRG LVL3 (GOWN DISPOSABLE) ×4
GOWN STRL REUS W/TWL XL LVL3 (GOWN DISPOSABLE)
GUIDE PIN OSTEOTOME 2.4MM (MISCELLANEOUS) ×2
IMPL HTO KNEE IBAL S11/M10 (Joint) IMPLANT
IMPLANT HTO KNEE IBAL S11/M10 (Joint) ×2 IMPLANT
KIT BASIN OR (CUSTOM PROCEDURE TRAY) ×2 IMPLANT
KIT TURNOVER KIT B (KITS) ×2 IMPLANT
MANIFOLD NEPTUNE II (INSTRUMENTS) ×1 IMPLANT
NDL 18GX1X1/2 (RX/OR ONLY) (NEEDLE) IMPLANT
NDL FILTER BLUNT 18X1 1/2 (NEEDLE) IMPLANT
NDL HYPO 25GX1X1/2 BEV (NEEDLE) ×1 IMPLANT
NDL SUT 2-0 SCORPION KNEE (NEEDLE) IMPLANT
NEEDLE 18GX1X1/2 (RX/OR ONLY) (NEEDLE) IMPLANT
NEEDLE FILTER BLUNT 18X 1/2SAF (NEEDLE) ×1
NEEDLE FILTER BLUNT 18X1 1/2 (NEEDLE) ×1 IMPLANT
NEEDLE HYPO 25GX1X1/2 BEV (NEEDLE) ×2 IMPLANT
NEEDLE SUT 2-0 SCORPION KNEE (NEEDLE) IMPLANT
NS IRRIG 1000ML POUR BTL (IV SOLUTION) ×2 IMPLANT
PACK ARTHROSCOPY DSU (CUSTOM PROCEDURE TRAY) ×2 IMPLANT
PAD ARMBOARD 7.5X6 YLW CONV (MISCELLANEOUS) ×3 IMPLANT
PAD COLD SHLDR WRAP-ON (PAD) ×1 IMPLANT
PADDING CAST COTTON 6X4 STRL (CAST SUPPLIES) ×2 IMPLANT
PENCIL BUTTON HOLSTER BLD 10FT (ELECTRODE) ×2 IMPLANT
PIN GUIDE OSTEOTOME 2.4MM (MISCELLANEOUS) IMPLANT
PORT APPOLLO RF 90DEGREE MULTI (SURGICAL WAND) ×1 IMPLANT
SOL PREP POV-IOD 4OZ 10% (MISCELLANEOUS) ×1 IMPLANT
SPONGE T-LAP 4X18 ~~LOC~~+RFID (SPONGE) ×1 IMPLANT
SUT ETHILON 3 0 PS 1 (SUTURE) ×1 IMPLANT
SUT FIBERWIRE 2-0 18 17.9 3/8 (SUTURE)
SUT MENISCAL KIT (KITS) IMPLANT
SUT VIC AB 0 CT1 27 (SUTURE) ×2
SUT VIC AB 0 CT1 27XBRD ANBCTR (SUTURE) IMPLANT
SUT VIC AB 2-0 CT1 27 (SUTURE) ×2
SUT VIC AB 2-0 CT1 TAPERPNT 27 (SUTURE) IMPLANT
SUTURE FIBERWR 2-0 18 17.9 3/8 (SUTURE) IMPLANT
SYR 20ML ECCENTRIC (SYRINGE) ×2 IMPLANT
SYR 5ML LUER SLIP (SYRINGE) ×1 IMPLANT
SYR CONTROL 10ML LL (SYRINGE) IMPLANT
SYR TB 1ML LUER SLIP (SYRINGE) ×1 IMPLANT
TOWEL GREEN STERILE (TOWEL DISPOSABLE) ×2 IMPLANT
TOWEL GREEN STERILE FF (TOWEL DISPOSABLE) ×2 IMPLANT
TUBE CONNECTING 12X1/4 (SUCTIONS) ×2 IMPLANT
TUBING ARTHROSCOPY IRRIG 16FT (MISCELLANEOUS) ×2 IMPLANT
WATER STERILE IRR 1000ML POUR (IV SOLUTION) ×1 IMPLANT
YANKAUER SUCT BULB TIP NO VENT (SUCTIONS) ×1 IMPLANT

## 2022-05-19 NOTE — Anesthesia Procedure Notes (Addendum)
Procedure Name: LMA Insertion Date/Time: 05/19/2022 12:12 PM  Performed by: Alease Medina, CRNAPre-anesthesia Checklist: Patient identified, Emergency Drugs available, Suction available and Patient being monitored Patient Re-evaluated:Patient Re-evaluated prior to induction Oxygen Delivery Method: Circle system utilized Preoxygenation: Pre-oxygenation with 100% oxygen Induction Type: IV induction LMA: LMA inserted LMA Size: 4.0 Tube type: Oral Number of attempts: 1 Placement Confirmation: positive ETCO2 and breath sounds checked- equal and bilateral Tube secured with: Tape Dental Injury: Teeth and Oropharynx as per pre-operative assessment  Comments: Placed by Cy Blamer, CRNA

## 2022-05-19 NOTE — Anesthesia Preprocedure Evaluation (Signed)
Anesthesia Evaluation  Patient identified by MRN, date of birth, ID band Patient awake    Reviewed: Allergy & Precautions, NPO status , Patient's Chart, lab work & pertinent test results  Airway Mallampati: II  TM Distance: >3 FB Neck ROM: Full    Dental no notable dental hx.    Pulmonary neg pulmonary ROS, former smoker,    Pulmonary exam normal breath sounds clear to auscultation       Cardiovascular hypertension, Pt. on medications negative cardio ROS Normal cardiovascular exam Rhythm:Regular Rate:Normal     Neuro/Psych Anxiety Bipolar Disorder negative neurological ROS  negative psych ROS   GI/Hepatic negative GI ROS, Neg liver ROS,   Endo/Other  negative endocrine ROS  Renal/GU negative Renal ROS  negative genitourinary   Musculoskeletal  (+) Arthritis , Osteoarthritis,    Abdominal   Peds negative pediatric ROS (+)  Hematology negative hematology ROS (+)   Anesthesia Other Findings   Reproductive/Obstetrics negative OB ROS                             Anesthesia Physical Anesthesia Plan  ASA: 2  Anesthesia Plan: General   Post-op Pain Management: Tylenol PO (pre-op)* and Gabapentin PO (pre-op)*   Induction: Intravenous  PONV Risk Score and Plan: 3 and Ondansetron, Dexamethasone, Midazolam and Treatment may vary due to age or medical condition  Airway Management Planned: LMA and Oral ETT  Additional Equipment:   Intra-op Plan:   Post-operative Plan: Extubation in OR  Informed Consent: I have reviewed the patients History and Physical, chart, labs and discussed the procedure including the risks, benefits and alternatives for the proposed anesthesia with the patient or authorized representative who has indicated his/her understanding and acceptance.     Dental advisory given  Plan Discussed with: CRNA  Anesthesia Plan Comments:         Anesthesia Quick  Evaluation
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
## 2022-05-19 NOTE — Discharge Instructions (Signed)
     Discharge Instructions    Attending Surgeon: Huel Cote, MD Office Phone Number: [phone]   Diagnosis and Procedures:    Surgeries Performed: Right knee high tibial osteotomy  Discharge Plan:    Diet: Resume usual diet. Begin with light or bland foods.  Drink plenty of fluids.  Activity:  Touchdown weight bearing with brace locked straight. You are advised to go home directly from the hospital or surgical center. Restrict your activities.  GENERAL INSTRUCTIONS: 1.  Please apply ice to your wound to help with swelling and inflammation. This will improve your comfort and your overall recovery following surgery.     2. Please call Dr. Serena Croissant office at [phone] with questions Monday-Friday during business hours. If no one answers, please leave a message and someone should get back to the patient within 24 hours. For emergencies please call 911 or proceed to the emergency room.   3. Patient to notify surgical team if experiences any of the following: Bowel/Bladder dysfunction, uncontrolled pain, nerve/muscle weakness, incision with increased drainage or redness, nausea/vomiting and Fever greater than 101.0 F.  Be alert for signs of infection including redness, streaking, odor, fever or chills. Be alert for excessive pain or bleeding and notify your surgeon immediately.  WOUND INSTRUCTIONS:   Leave your dressing, cast, or splint in place until your post operative visit.  Keep it clean and dry.  Always keep the incision clean and dry until the staples/sutures are removed. If there is no drainage from the incision you should keep it open to air. If there is drainage from the incision you must keep it covered at all times until the drainage stops  Do not soak in a bath tub, hot tub, pool, lake or other body of water until 21 days after your surgery and your incision is completely dry and healed.  If you have removable sutures (or staples) they must be removed 10-14  days (unless otherwise instructed) from the day of your surgery.     1)  Elevate the extremity as much as possible.  2)  Keep the dressing clean and dry.  3)  Please call us if the dressing becomes wet or dirty.  4)  If you are experiencing worsening pain or worsening swelling, please call.     MEDICATIONS: Resume all previous home medications at the previous prescribed dose and frequency unless otherwise noted Start taking the  pain medications on an as-needed basis as prescribed  Please taper down pain medication over the next week following surgery.  Ideally you should not require a refill of any narcotic pain medication.  Take pain medication with food to minimize nausea. In addition to the prescribed pain medication, you may take over-the-counter pain relievers such as Tylenol.  Do NOT take additional tylenol if your pain medication already has tylenol in it.  Aspirin 325mg  daily for four weeks.      FOLLOWUP INSTRUCTIONS: 1. Follow up at the Physical Therapy Clinic 3-4 days following surgery. This appointment should be scheduled unless other arrangements have been made.The Physical Therapy scheduling number is [phone] if an appointment has not already been arranged.  2. Contact Dr. [phone] office during office hours at [phone] or the practice after hours line at [phone] for non-emergencies. For medical emergencies call 911.   Discharge Location: Home

## 2022-05-19 NOTE — Brief Op Note (Signed)
   Brief Op Note  Date of Surgery: 05/19/2022  Preoperative Diagnosis: RIGHT POST TRAUMATIC TIBIAL DEFORMITY, MENISCAL TEAR  Postoperative Diagnosis: same  Procedure: Procedure(s): RIGHT KNEE ARTHROSCOPY, MEDIAL MENISCAL DEBRIDEMENT AND ABRASION ARTHROPLASTY RIGHT KNEE HIGH TIBIAL OSTEOTOMY  Implants: Implant Name Type Inv. Item Serial No. Manufacturer Lot No. LRB No. Used Action  HTO Implant, iBalance, Small 11 degree/ Medium 10 degere    [employer] [id number] Right 1 Implanted  HTO  Anchor, iBalance, Cancellous    [employer] [id number] Right 1 Implanted  ANCHOR IBALANCE HTO 6.5X28 - [id number] Anchor ANCHOR IBALANCE HTO 6.5X28  [employer] [id number] Right 1 Implanted  HTO Anchor, iBalance Cortical 4.5 x 30 mm    [employer] [id number] Right 1 Implanted  HTO Anchor, iBalance, Cortical 4.5 x 36 mm    [employer] [id number] Right 1 Implanted    Surgeons: Surgeon(s): Huel Cote, MD  Anesthesia: General    Estimated Blood Loss: See anesthesia record  Complications: None  Condition to PACU: Stable  Benancio Deeds, MD 05/19/2022 2:36 PM

## 2022-05-19 NOTE — Anesthesia Procedure Notes (Signed)
Anesthesia Regional Block: Popliteal block   Pre-Anesthetic Checklist: , timeout performed,  Correct Patient, Correct Site, Correct Laterality,  Correct Procedure, Correct Position, site marked,  Risks and benefits discussed,  Surgical consent,  Pre-op evaluation,  At surgeon's request and post-op pain management  Laterality: Right  Prep: chloraprep       Needles:  Injection technique: Single-shot  Needle Type: Stimiplex     Needle Length: 9cm  Needle Gauge: 21     Additional Needles:   Procedures:,,,, ultrasound used (permanent image in chart),,    Narrative:  Start time: 05/19/2022 11:39 AM End time: 05/19/2022 11:44 AM Injection made incrementally with aspirations every 5 mL.  Performed by: Personally  Anesthesiologist: Lowella Curb, MD

## 2022-05-19 NOTE — Anesthesia Postprocedure Evaluation (Signed)
Anesthesia Post Note  Patient: Sarah Salas  Procedure(s) Performed: RIGHT KNEE ARTHROSCOPY, MEDIAL MENISCAL DEBRIDEMENT AND ABRASION ARTHROPLASTY (Right: Knee) RIGHT KNEE HIGH TIBIAL OSTEOTOMY (Right: Knee)     Patient location during evaluation: PACU Anesthesia Type: Regional and General Level of consciousness: awake and alert Pain management: pain level controlled Vital Signs Assessment: post-procedure vital signs reviewed and stable Respiratory status: spontaneous breathing, nonlabored ventilation, respiratory function stable and patient connected to nasal cannula oxygen Cardiovascular status: blood pressure returned to baseline and stable Postop Assessment: no apparent nausea or vomiting Anesthetic complications: no   No notable events documented.  Last Vitals:  Vitals:   05/19/22 1653 05/19/22 1954  BP: 132/85 122/76  Pulse: 78 83  Resp:  18  Temp: 37.3 C (!) 36.2 C  SpO2: 100% 99%    Last Pain:  Vitals:   05/19/22 1653  TempSrc: Axillary  PainSc:                  Othon Guardia

## 2022-05-19 NOTE — Anesthesia Procedure Notes (Signed)
Anesthesia Regional Block: Femoral nerve block   Pre-Anesthetic Checklist: , timeout performed,  Correct Patient, Correct Site, Correct Laterality,  Correct Procedure, Correct Position, site marked,  Risks and benefits discussed,  Surgical consent,  Pre-op evaluation,  At surgeon's request and post-op pain management  Laterality: Right  Prep: chloraprep       Needles:  Injection technique: Single-shot  Needle Type: Stimiplex     Needle Length: 9cm  Needle Gauge: 21     Additional Needles:   Procedures:,,,, ultrasound used (permanent image in chart),,    Narrative:  Start time: 05/19/2022 11:38 AM End time: 05/19/2022 11:43 AM Injection made incrementally with aspirations every 5 mL.  Performed by: Personally  Anesthesiologist: Lowella Curb, MD

## 2022-05-19 NOTE — Op Note (Signed)
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  Previous diagnostic arthroscopy revealed intact cruciate ligaments with no more than grade 2 changes involving the medial joint aside from a focal area of grade 4 changes underneath.  She is unable to obtain an MRI due to a plate in her head.  She ultimately elected for right knee arthroscopy with high tibial osteotomy. The risk and benefits of the procedure were discussed in detail and documented in the pre-operative evaluation.   PREOPERATIVE DIAGNOSIS: 1. 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  Right proximal tibia malunion posttraumatic   POSTOPERATIVE DIAGNOSIS: Same.  PROCEDURE: 1.  Right knee high tibial osteotomy 2.  Right knee arthroscopy with medial meniscal debridement  SURGEON: Benancio Deeds MD  ASSISTANT: Kerby Less, ATC  ANESTHESIA:  general plus peripheral nerve block  IV FLUIDS AND URINE: See anesthesia record.  ANTIBIOTICS: Ancef  ESTIMATED BLOOD LOSS: 100 mL.  IMPLANTS:  Implant Name Type Inv. Item Serial No. Manufacturer Lot No. LRB No. Used Action  HTO Implant, iBalance, Small 11 degree/ Medium 10 degere    [employer] [id number] Right 1 Implanted  HTO  Anchor, iBalance, Cancellous    [employer] [id number] Right 1 Implanted  ANCHOR IBALANCE HTO 6.5X28 - [id number] Anchor ANCHOR IBALANCE HTO 6.5X28  [employer] [id number] Right 1 Implanted  HTO Anchor, iBalance Cortical 4.5 x 30 mm    [employer] [id number] Right 1 Implanted  HTO Anchor, iBalance, Cortical 4.5 x 36 mm    [employer] [id number] Right 1 Implanted    DRAINS: None  CULTURES: None  COMPLICATIONS: none  DESCRIPTION OF PROCEDURE:  Examination under anesthesia: A careful examination under anesthesia was performed.  Knee ROM motion was:  0-130 Lachman: Normal Pivot Shift: Normal Posterior drawer: normal.   Varus stability in full extension: normal.   Varus stability in 30 degrees of flexion: normal.  Valgus stability in full extension: normal.   Valgus stability in 30 degrees of flexion: normal.  Posterolateral drawer: normal   Intra-operative findings: A thorough arthroscopic examination of the knee was performed.  The findings are: 1. Suprapatellar pouch: Normal 2. Undersurface of median ridge: Grade 1 changes 3. Medial patellar facet:Grade 1 changes 4. Lateral patellar facet: Grade 1 changes 5. Trochlea: Normal 6. Lateral gutter/popliteus tendon: Normal 7. Hoffa's fat pad: Normal 8. Medial gutter/plica: Normal 9. ACL: Normal 10. PCL: Normal 11. Medial meniscus: Fraying of the white white area involving the mid body with an intact root 12. Medial compartment cartilage: Grade 2 changes with full-thickness cartilage loss medially under meniscus 13. Lateral meniscus: Normal 14. Lateral compartment cartilage: Normal  Informed consent and initialing of the operative proximal leg was performed.  Timeout was performed with nursing.  Anesthesia performed a peripheral nerve block.  She was subsequently taken back to the operating room.  Additional timeout was performed.  I began with diagnostic arthroscopy with a standard anterior lateral portal.  The findings are documented above.  A shaver was introduced into the medial portal and the white white tearing of the medial meniscus was debrided back to healthy appearing meniscus with 90% remaining.  An anteromedial longitudinal incision was made to expose the proximal medial tibia. A transverse incision was made in the periosteum proximal to the pes anserine tendons. The medial collateral ligament was elevated off the tibia,  and a Bennett retractor was placed along the posterior aspect of the tibia. Two K-wires were placed obliquely from medial to lateral under fluoroscopic  visualization to determine the osteotomy cut. The cut was made with an oscillating saw, protecting the posterior structures, utilizing the Bennett retractor. This was also visualized fluoroscopically.  Retractors were used to protect posteriorly as well as the patella tendon anteriorly. With gradual tension, and the use of osteotomes and the laminar spreader, the osteotomy was opened.  The cut was made according to preoperative templating with a goal of 10 degrees of correction. This equated to 10 degrees of opening. In this position, we placed the high balance implant. The 2 locking screws were placed under fluoroscopic visualization, into the subchondral bone of the proximal tibia.  The distal 2 screws were also placed under direct fluoroscopic visualization.   This construct was visualized fluoroscopically and found to be in good position. The periosteum was closed over the defect.  The 0 Vicryl was used to repair the medial meniscus.  Wound was thoroughly irrigated.  Subcutaneous tissues were closed with 2-0 and 3-0 Vicryl sutures. The skin incision was closed with interrupted 3-0 nylon sutures. A sterile dressing was applied. The patient tolerated the procedure well and was taken to the recovery room in stable condition.    POSTOPERATIVE PLAN: She will be touchdown weightbearing with the brace locked in extension on the right leg for a total of 2 weeks.  She will be admitted for 23-hour observation.  She will be seen by physical therapy.  Will plan to place her on aspirin for blood clot prevention.  I will see her back in 2 weeks for wound check.  Benancio Deeds, MD 2:37 PM

## 2022-05-19 NOTE — Interval H&P Note (Signed)
History and Physical Interval Note:  05/19/2022 11:32 AM  Sarah Salas  has presented today for surgery, with the diagnosis of RIGHT POST TRAUMATIC TIBIAL DEFORMITY, MENISCAL TEAR.  The various methods of treatment have been discussed with the patient and family. After consideration of risks, benefits and other options for treatment, the patient has consented to  Procedure(s): RIGHT KNEE ARTHROSCOPY WITH MENISCAL DEBRIDEMENT AND ABRASION ARTHROPLASTY (Right) RIGHT KNEE HIGH TIBIAL OSTEOTOMY (Right) as a surgical intervention.  The patient's history has been reviewed, patient examined, no change in status, stable for surgery.  I have reviewed the patient's chart and labs.  Questions were answered to the patient's satisfaction.     Huel Cote

## 2022-05-19 NOTE — Transfer of Care (Signed)
Immediate Anesthesia Transfer of Care Note  Patient: Sarah Salas  Procedure(s) Performed: RIGHT KNEE ARTHROSCOPY, MEDIAL MENISCAL DEBRIDEMENT AND ABRASION ARTHROPLASTY (Right: Knee) RIGHT KNEE HIGH TIBIAL OSTEOTOMY (Right: Knee)  Patient Location: PACU  Anesthesia Type:General and Regional  Level of Consciousness: awake, alert , oriented and patient cooperative  Airway & Oxygen Therapy: Patient Spontanous Breathing  Post-op Assessment: Report given to RN, Post -op Vital signs reviewed and stable and Patient moving all extremities X 4  Post vital signs: Reviewed and stable  Last Vitals:  Vitals Value Taken Time  BP 108/73 05/19/22 1441  Temp    Pulse 76 05/19/22 1443  Resp 14 05/19/22 1443  SpO2 98 % 05/19/22 1443  Vitals shown include unvalidated device data.  Last Pain:  Vitals:   05/19/22 1103  TempSrc:   PainSc: 0-No pain         Complications: No notable events documented.

## 2022-05-20 ENCOUNTER — Encounter (HOSPITAL_COMMUNITY): Payer: Self-pay | Admitting: Orthopaedic Surgery

## 2022-05-20 ENCOUNTER — Encounter (HOSPITAL_BASED_OUTPATIENT_CLINIC_OR_DEPARTMENT_OTHER): Payer: Self-pay | Admitting: Orthopaedic Surgery

## 2022-05-20 DIAGNOSIS — S83241A Other tear of medial meniscus, current injury, right knee, initial encounter: Secondary | ICD-10-CM | POA: Diagnosis not present

## 2022-05-20 DIAGNOSIS — S82141P Displaced bicondylar fracture of right tibia, subsequent encounter for closed fracture with malunion: Secondary | ICD-10-CM | POA: Diagnosis not present

## 2022-05-20 LAB — BASIC METABOLIC PANEL
Anion gap: 7 (ref 5–15)
BUN: 16 mg/dL (ref 6–20)
CO2: 24 mmol/L (ref 22–32)
Calcium: 8.9 mg/dL (ref 8.9–10.3)
Chloride: 108 mmol/L (ref 98–111)
Creatinine, Ser: 0.67 mg/dL (ref 0.44–1.00)
GFR, Estimated: >60 mL/min (ref >60)
Glucose, Bld: 125 mg/dL — ABNORMAL HIGH (ref 70–99)
Potassium: 3.9 mmol/L (ref 3.5–5.1)
Sodium: 139 mmol/L (ref 135–145)

## 2022-05-20 NOTE — Progress Notes (Signed)
Nursing Discharge Note   Admit Date:05/19/22  Discharge date:05/20/22   Patient to be discharged home per MD order.  AVS completed. Reviewed with patient at bedside. Highlighted copy provided for patient to take home.  Patient able to verbalize understanding of discharge instructions. PIV removed. Patient stable upon discharge.       Allergies as of 05/20/2022   No Known Allergies      Medication List     TAKE these medications    acetaminophen 500 MG tablet Commonly known as: TYLENOL Take 1,000 mg by mouth every 6 (six) hours as needed for mild pain.   ARIPiprazole 5 MG tablet Commonly known as: ABILIFY Take 1 tablet by mouth once daily What changed:  how much to take how to take this when to take this additional instructions   aspirin EC 325 MG tablet Take 1 tablet (325 mg total) by mouth daily.   atomoxetine 40 MG capsule Commonly known as: Strattera Take 1 capsule (40 mg total) by mouth daily.   citalopram 20 MG tablet Commonly known as: CELEXA Take 1 tablet (20 mg total) by mouth daily.   clonazePAM 0.5 MG tablet Commonly known as: KlonoPIN Take 1 tablet (0.5 mg total) by mouth 3 (three) times daily as needed for anxiety.   HYDROcodone-acetaminophen 5-325 MG tablet Commonly known as: NORCO/VICODIN Take 1 tablet by mouth every 6 (six) hours as needed for moderate pain.   lamoTRIgine 200 MG tablet Commonly known as: LaMICtal Take 1 tablet (200 mg total) by mouth daily.   latanoprost 0.005 % ophthalmic solution Commonly known as: XALATAN Place 1 drop into both eyes at bedtime.   traZODone 150 MG tablet Commonly known as: DESYREL Take 1 tablet (150 mg total) by mouth at bedtime.         Discharge Instructions/ Education: Discharge instructions given to patient with verbalized understanding. Discharge education completed with patient including: follow up instructions, medication list, discharge activities, and limitations if indicated. Additional  discharge instructions as indicated by discharging provider also reviewed. Patient able to verbalize understanding, all questions fully answered. Patient instructed to return to Emergency Department, call 911, or call MD for any changes in condition.  Patient will escorted via wheelchair to lobby and discharged home via private automobile.

## 2022-05-20 NOTE — Plan of Care (Signed)
  Problem: Education: Goal: Knowledge of General Education information will improve Description: Including pain rating scale, medication(s)/side effects and non-pharmacologic comfort measures Outcome: Progressing   Problem: Health Behavior/Discharge Planning: Goal: Ability to manage health-related needs will improve Outcome: Progressing   Problem: Activity: Goal: Risk for activity intolerance will decrease Outcome: Progressing   Problem: Nutrition: Goal: Adequate nutrition will be maintained Outcome: Progressing   Problem: Elimination: Goal: Will not experience complications related to bowel motility Outcome: Progressing Goal: Will not experience complications related to urinary retention Outcome: Progressing   Problem: Skin Integrity: Goal: Risk for impaired skin integrity will decrease Outcome: Progressing

## 2022-05-20 NOTE — Evaluation (Signed)
Physical Therapy Evaluation Patient Details Name: Sarah Salas MRN: [id number] DOB: 1976/05/03 Today's Date: 05/20/2022  History of Present Illness  Sarah Salas is a 46 y.o. female presents with right knee pain, popping, and buckling that has been existent for several years but worse over the last several months. s/p RIGHT KNEE ARTHROSCOPY, MEDIAL MENISCAL DEBRIDEMENT AND ABRASION ARTHROPLASTY  RIGHT KNEE HIGH TIBIAL OSTEOTOMY on 8/8, TDWB RLE;  has a past medical history of ADHD (attention deficit hyperactivity disorder), Anxiety, Arthritis, Bipolar 1 disorder ([place]), Brain injury ([place]) (2005), Glaucoma, and Hypertension.  Clinical Impression   Patient evaluated by Physical Therapy with no further acute PT needs identified. All education has been completed and the patient has no further questions. OK for dc home from PT standpoint;  See below for any follow-up Physical Therapy or equipment needs. PT is signing off. Thank you for this referral.        Recommendations for follow up therapy are one component of a multi-disciplinary discharge planning process, led by the attending physician.  Recommendations may be updated based on patient status, additional functional criteria and insurance authorization.  Follow Up Recommendations Outpatient PT      Assistance Recommended at Discharge PRN  Patient can return home with the following       Equipment Recommendations Rolling walker (2 wheels)  Recommendations for Other Services       Functional Status Assessment Patient has had a recent decline in their functional status and demonstrates the ability to make significant improvements in function in a reasonable and predictable amount of time.     Precautions / Restrictions Precautions Precautions: Fall Required Braces or Orthoses:  (pt with Donjoy brace on upon PT arrival) Restrictions Weight Bearing Restrictions: Yes RLE Weight Bearing: Touchdown weight bearing      Mobility  Bed  Mobility Overal bed mobility: Modified Independent                  Transfers Overall transfer level: Needs assistance Equipment used: Rolling walker (2 wheels) Transfers: Sit to/from Stand Sit to Stand: Supervision           General transfer comment: Cues for hand placement and safety; movign quickly    Ambulation/Gait Ambulation/Gait assistance: Supervision Gait Distance (Feet): 45 Feet Assistive device: Rolling walker (2 wheels) Gait Pattern/deviations:  (Hop-to pattern)       General Gait Details: Demonstrated TDWB gait pattern and pt voices understanding; Pt opts to get up and walk keeping the weight completely off of her RLE; Overall steady, and she seems to prefer teh RW  Stairs         General stair comments: Plans to sit down and bump up steps, and she is well versed in the technique; Discussed options for coming to a stand at the top of the steps, with assist, a step stool and chair; Also discussed the option of using a shower chair as an even surface to sit on on the stairs  Wheelchair Mobility    Modified Rankin (Stroke Patients Only)       Balance Overall balance assessment: Mild deficits observed, not formally tested                                           Pertinent Vitals/Pain Pain Assessment Pain Assessment: Faces Faces Pain Scale: No hurt Pain Intervention(s): Monitored during session    Home Living  Family/patient expects to be discharged to:: Private residence Living Arrangements: Parent;Other relatives Available Help at Discharge: Family;Available 24 hours/day Type of Home: House Home Access: Level entry     Alternate Level Stairs-Number of Steps: 12 Home Layout: Two level;Bed/bath upstairs Home Equipment: BSC/3in1;Crutches;Grab bars - toilet;Grab bars - tub/shower;Shower seat      Prior Function Prior Level of Function : Independent/Modified Independent             Mobility Comments: Independent,  enjoys the gym; 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; incr pain and diffiulty walking leading up to this admission; At times managing stairs by bumping up ADLs Comments: independent     Hand Dominance        Extremity/Trunk Assessment   Upper Extremity Assessment Upper Extremity Assessment: Overall WFL for tasks assessed    Lower Extremity Assessment Lower Extremity Assessment: RLE deficits/detail RLE Deficits / Details: Braced in knee extension; no difficulty lifting LE off of the bed    Cervical / Trunk Assessment Cervical / Trunk Assessment: Normal  Communication   Communication: No difficulties  Cognition Arousal/Alertness: Awake/alert Behavior During Therapy: WFL for tasks assessed/performed, Impulsive Overall Cognitive Status: Within Functional Limits for tasks assessed                                          General Comments General comments (skin integrity, edema, etc.): Boyfriend present and helpful    Exercises     Assessment/Plan    PT Assessment All further PT needs can be met in the next venue of care  PT Problem List Decreased range of motion;Decreased activity tolerance;Decreased balance;Decreased mobility;Decreased coordination;Decreased knowledge of use of DME;Pain;Decreased knowledge of precautions       PT Treatment Interventions      PT Goals (Current goals can be found in the Care Plan section)  Acute Rehab PT Goals Patient Stated Goal: Home by 11am PT Goal Formulation: All assessment and education complete, DC therapy    Frequency       Co-evaluation               AM-PAC PT "6 Clicks" Mobility  Outcome Measure Help needed turning from your back to your side while in a flat bed without using bedrails?: None Help needed moving from lying on your back to sitting on the side of a flat bed without using bedrails?: None Help needed moving to and from a bed to a chair (including a wheelchair)?: None Help needed standing up  from a chair using your arms (e.g., wheelchair or bedside chair)?: None Help needed to walk in hospital room?: None Help needed climbing 3-5 steps with a railing? : A Little 6 Click Score: 23    End of Session Equipment Utilized During Treatment: Gait belt Activity Tolerance: Patient tolerated treatment well Patient left: in chair;with call bell/phone within reach;with family/visitor present Nurse Communication: Mobility status (and ok for dc) PT Visit Diagnosis: Unsteadiness on feet (R26.81);Other abnormalities of gait and mobility (R26.89)    Time: 4982-6415 PT Time Calculation (min) (ACUTE ONLY): 20 min   Charges:   PT Evaluation $PT Eval Low Complexity: [place], PT  Acute Rehabilitation Services Office [phone]   Colletta [place] 05/20/2022, 11:55 AM

## 2022-05-20 NOTE — Discharge Summary (Signed)
Patient ID: Sarah Salas MRN: [id number] DOB/AGE: 03/20/1976 46 y.o.  Admit date: 05/19/2022 Discharge date: 05/20/2022  Admission Diagnoses:  Closed fracture of right tibial plateau with malunion  Discharge Diagnoses:  Principal Problem:   Closed fracture of right tibial plateau with malunion   Past Medical History:  Diagnosis Date   ADHD (attention deficit hyperactivity disorder)    Anxiety    Arthritis    Bipolar 1 disorder (HCC)    Brain injury (HCC) 2005   Glaucoma    Hypertension    no longer treated    Surgeries: Procedure(s): RIGHT KNEE ARTHROSCOPY, MEDIAL MENISCAL DEBRIDEMENT AND ABRASION ARTHROPLASTY RIGHT KNEE HIGH TIBIAL OSTEOTOMY on 05/19/2022   Consultants (if any):   Discharged Condition: Improved  Hospital Course: Nashay Brickley is an 46 y.o. female who was admitted 05/19/2022 with a diagnosis of Closed fracture of right tibial plateau with malunion and went to the operating room on 05/19/2022 and underwent the above named procedures.    She was given perioperative antibiotics:  Anti-infectives (From admission, onward)    Start     Dose/Rate Route Frequency Ordered Stop   05/19/22 1000  ceFAZolin (ANCEF) IVPB 2g/100 mL premix        2 g 200 mL/hr over 30 Minutes Intravenous On call to O.R. 05/19/22 3810 05/19/22 1231     .  She was given sequential compression devices, early ambulation, and appropriate chemoprophylaxis for DVT prophylaxis.  She benefited maximally from the hospital stay and there were no complications.    Recent vital signs:  Vitals:   05/19/22 2351 05/20/22 0506  BP: 111/67 (!) 141/73  Pulse: 72 76  Resp: 15 14  Temp: (!) 97.3 F (36.3 C) (!) 97.1 F (36.2 C)  SpO2: 98% 100%    Recent laboratory studies:  Lab Results  Component Value Date   HGB 14.3 05/12/2022   HGB 14.3 01/21/2022   HGB 15.1 11/25/2020   Lab Results  Component Value Date   WBC 6.7 05/12/2022   PLT 306 05/12/2022   No results found for:  "INR" Lab Results  Component Value Date   NA 139 05/20/2022   K 3.9 05/20/2022   CL 108 05/20/2022   CO2 24 05/20/2022   BUN 16 05/20/2022   CREATININE 0.67 05/20/2022   GLUCOSE 125 (H) 05/20/2022    Discharge Medications:   Allergies as of 05/20/2022   No Known Allergies      Medication List     TAKE these medications    acetaminophen 500 MG tablet Commonly known as: TYLENOL Take 1,000 mg by mouth every 6 (six) hours as needed for mild pain.   ARIPiprazole 5 MG tablet Commonly known as: ABILIFY Take 1 tablet by mouth once daily What changed:  how much to take how to take this when to take this additional instructions   aspirin EC 325 MG tablet Take 1 tablet (325 mg total) by mouth daily.   atomoxetine 40 MG capsule Commonly known as: Strattera Take 1 capsule (40 mg total) by mouth daily.   citalopram 20 MG tablet Commonly known as: CELEXA Take 1 tablet (20 mg total) by mouth daily.   clonazePAM 0.5 MG tablet Commonly known as: KlonoPIN Take 1 tablet (0.5 mg total) by mouth 3 (three) times daily as needed for anxiety.   HYDROcodone-acetaminophen 5-325 MG tablet Commonly known as: NORCO/VICODIN Take 1 tablet by mouth every 6 (six) hours as needed for moderate pain.   lamoTRIgine 200 MG tablet Commonly  known as: LaMICtal Take 1 tablet (200 mg total) by mouth daily.   latanoprost 0.005 % ophthalmic solution Commonly known as: XALATAN Place 1 drop into both eyes at bedtime.   traZODone 150 MG tablet Commonly known as: DESYREL Take 1 tablet (150 mg total) by mouth at bedtime.        Diagnostic Studies: DG Knee Complete 4 Views Right  Result Date: 05/19/2022 CLINICAL DATA:  RIGHT KNEE ARTHROSCOPY, MEDIAL MENISCAL DEBRIDEMENT AND ABRASION ARTHROPLASTY RSTO- EXAM: RIGHT KNEE - COMPLETE 4+ VIEW COMPARISON:  Right knee 11/10/2021 FINDINGS: 4 C-arm images the right knee were obtained. Joint space narrowing medially. Osteotomy in the medial proximal tibia.  Surgical implant in the osteotomy. IMPRESSION: Osteotomy proximal medial tibia. Electronically Signed   By: Marlan Palau M.D.   On: 05/19/2022 15:01   DG C-Arm 1-60 Min-No Report  Result Date: 05/19/2022 Fluoroscopy was utilized by the requesting physician.  No radiographic interpretation.   DG C-Arm 1-60 Min-No Report  Result Date: 05/19/2022 Fluoroscopy was utilized by the requesting physician.  No radiographic interpretation.   DG C-Arm 1-60 Min-No Report  Result Date: 05/19/2022 Fluoroscopy was utilized by the requesting physician.  No radiographic interpretation.    Disposition: Discharge disposition: 01-Home or Self Care          Follow-up Information     Huel Cote, MD Follow up.   Specialty: Orthopedic Surgery Contact information: [address] [place] [address] [phone]                  Signed: Huel Cote 05/20/2022, 7:15 AM

## 2022-05-20 NOTE — Progress Notes (Signed)
   Subjective:  Postop day 1 overall doing very well.  Nerve block is still in effect.  Pain is well-controlled.  Tolerating diet.  Pending PT eval  Objective:   VITALS:   Vitals:   05/19/22 1653 05/19/22 1954 05/19/22 2351 05/20/22 0506  BP: 132/85 122/76 111/67 (!) 141/73  Pulse: 78 83 72 76  Resp:  18 15 14   Temp: 99.2 F (37.3 C) (!) 97.1 F (36.2 C) (!) 97.3 F (36.3 C) (!) 97.1 F (36.2 C)  TempSrc: Axillary     SpO2: 100% 99% 98% 100%  Weight:      Height:        Right lower extremity with popliteal nerve block in effect.  2+ dorsalis pedis pulse with good capillary perfusion.  Dressing is in place clean dry and intact.  Compartments are soft and compressible  Lab Results  Component Value Date   WBC 6.7 05/12/2022   HGB 14.3 05/12/2022   HCT 43.4 05/12/2022   MCV 89.5 05/12/2022   PLT 306 05/12/2022     Assessment/Plan:  1 Day Post-Op status post right leg high tibial osteotomy, overall doing very well.   - Patient to work with PT to optimize mobilization safely - DVT ppx - SCDs, ambulation, aspirin 325 daily -Touchdown weightbearing operative extremity - Pain control - multimodal pain management, ATC acetaminophen in conjunction with as needed narcotic (oxycodone), although this should be minimized with other modalities   Sarah Salas 05/20/2022, 7:09 AM

## 2022-05-21 ENCOUNTER — Telehealth: Payer: Self-pay

## 2022-05-21 NOTE — Telephone Encounter (Signed)
Transition Care Management Follow-up Telephone Call Date of discharge and from where: 05/20/2022, [facility] How have you been since you were released from the hospital? She said her pain is better controlled today.  They told her in the hospital not to let the pain get > 5 and yesterday it was >5. She is feeling better today but anxious for PT tomorrow.  Any questions or concerns? No  Items Reviewed: Did the pt receive and understand the discharge instructions provided? Yes  Medications obtained and verified? Yes - she said she has all of her medications.  Other? No  Any new allergies since your discharge? No  Dietary orders reviewed? Yes Do you have support at home? Yes   Home Care and Equipment/Supplies: Were home health services ordered? no If so, what is the name of the agency? N/a  Has the agency set up a time to come to the patient's home? not applicable Were any new equipment or medical supplies ordered?  No What is the name of the medical supply agency? N/a Were you able to get the supplies/equipment? not applicable Do you have any questions related to the use of the equipment or supplies? No  Functional Questionnaire: (I = Independent and D = Dependent) ADLs: has RW to use with ambulation. TDWB RLE . She has a brace on her RLE and said that she is not to remove it, the orthopedic surgeon will remove it at her upcoming appointment. Has assistance for ADLs available if needed.    Follow up appointments reviewed:  PCP Hospital f/u appt confirmed?  She will call to schedule an appointment when needed   Specialist Hospital f/u appt confirmed? Yes  Scheduled to see orthopedics - 06/04/2022.   Are transportation arrangements needed? No  If their condition worsens, is the pt aware to call PCP or go to the Emergency Dept.? Yes Was the patient provided with contact information for the PCP's office or ED? Yes Was to pt encouraged to call back with questions or concerns? Yes

## 2022-05-22 ENCOUNTER — Ambulatory Visit (HOSPITAL_BASED_OUTPATIENT_CLINIC_OR_DEPARTMENT_OTHER): Payer: Medicaid Other | Attending: Orthopaedic Surgery | Admitting: Physical Therapy

## 2022-05-22 ENCOUNTER — Other Ambulatory Visit: Payer: Self-pay

## 2022-05-22 ENCOUNTER — Encounter (HOSPITAL_BASED_OUTPATIENT_CLINIC_OR_DEPARTMENT_OTHER): Payer: Self-pay | Admitting: Physical Therapy

## 2022-05-22 DIAGNOSIS — R2689 Other abnormalities of gait and mobility: Secondary | ICD-10-CM | POA: Insufficient documentation

## 2022-05-22 DIAGNOSIS — M25661 Stiffness of right knee, not elsewhere classified: Secondary | ICD-10-CM | POA: Insufficient documentation

## 2022-05-22 DIAGNOSIS — M25561 Pain in right knee: Secondary | ICD-10-CM | POA: Diagnosis present

## 2022-05-22 DIAGNOSIS — S83241A Other tear of medial meniscus, current injury, right knee, initial encounter: Secondary | ICD-10-CM | POA: Insufficient documentation

## 2022-05-22 DIAGNOSIS — G8929 Other chronic pain: Secondary | ICD-10-CM | POA: Insufficient documentation

## 2022-05-22 DIAGNOSIS — R600 Localized edema: Secondary | ICD-10-CM | POA: Insufficient documentation

## 2022-05-22 NOTE — Therapy (Signed)
OUTPATIENT PHYSICAL THERAPY LOWER EXTREMITY EVALUATION   Patient Name: Sarah Salas MRN: [id number] DOB:November 17, 1975, 46 y.o., female Today's Date: 05/22/2022   PT End of Session - 05/22/22 1401     Visit Number 1    Number of Visits 16    Date for PT Re-Evaluation 07/03/22    PT Start Time 1358    PT Stop Time 1430    PT Time Calculation (min) 32 min    Activity Tolerance Patient tolerated treatment well    Behavior During Therapy [facility] for tasks assessed/performed;Impulsive             Past Medical History:  Diagnosis Date   ADHD (attention deficit hyperactivity disorder)    Anxiety    Arthritis    Bipolar 1 disorder (HCC)    Brain injury (HCC) 2005   Glaucoma    Hypertension    no longer treated   Past Surgical History:  Procedure Laterality Date   BRAIN SURGERY  10/28/2003   from [facility]   KNEE ARTHROSCOPY WITH MENISCAL REPAIR Right 05/19/2022   Procedure: RIGHT KNEE ARTHROSCOPY, MEDIAL MENISCAL DEBRIDEMENT AND ABRASION ARTHROPLASTY;  Surgeon: Huel Cote, MD;  Location: [facility] OR;  Service: Orthopedics;  Laterality: Right;   TIBIA OSTEOTOMY Right 05/19/2022   Procedure: RIGHT KNEE HIGH TIBIAL OSTEOTOMY;  Surgeon: Huel Cote, MD;  Location: [facility] OR;  Service: Orthopedics;  Laterality: Right;   WISDOM TOOTH EXTRACTION     Patient Active Problem List   Diagnosis Date Noted   Closed fracture of right tibial plateau with malunion 05/19/2022   Anxiety 11/08/2017   Bipolar affective disorder (HCC) 11/05/2017    PCP: Loreen Freud MD  REFERRING PROVIDER: Dr Huel Cote   REFERRING DIAG: R knee high tibial osteotomy, meniscal debridement, abrasion arthroplasty  THERAPY DIAG:  Chronic pain of right knee  Stiffness of right knee, not elsewhere classified  Other abnormalities of gait and mobility  Localized edema  Rationale for Evaluation and Treatment Rehabilitation  ONSET DATE: 05/19/2022  SUBJECTIVE:   SUBJECTIVE STATEMENT: Patient has a long history of  knee pain dating back to December of 2022. She failed conservative therapy. She had a medial meniscal debridement and a HTO on 05/19/2022. At this time her pain is well controlled. She has taken her pain medication. She is using her walker. Overall she is improving.   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 Yes: NPRS scale: 3/10 Pain location: lateral knee  Pain description: aching and sharp  Aggravating factors: standing and moving  Relieving factors: rest   PRECAUTIONS: None  WEIGHT BEARING RESTRICTIONS Yes WBAT  FALLS:  Has patient fallen in last 6 months? No  LIVING ENVIRONMENT: 2 level house; coming down the steps she has figured out a way to do it that is less painful   OCCUPATION: On [employer] 2nd to traumatic brain injury   PLOF: Independent with pain   PATIENT GOALS  Back to speed walking >1 mile    OBJECTIVE:   DIAGNOSTIC FINDINGS: Nothing post op   PATIENT SURVEYS:  FOTO not set up   COGNITION:  Overall cognitive status: Within functional limits for tasks assessed     SENSATION: WFL  EDEMA:  Circumferential: noticeable edema will measure next visit  POSTURE: rounded shoulders  PALPATION: No unexpected tenderness to palpation   LOWER EXTREMITY ROM:  Passive ROM Right eval Left eval  Hip flexion    Hip extension    Hip  abduction    Hip adduction    Hip internal rotation    Hip external rotation    Knee flexion 38   Knee extension 0   Ankle dorsiflexion    Ankle plantarflexion    Ankle inversion    Ankle eversion     (Blank rows = not tested)  LOWER EXTREMITY MMT:  MMT Right eval Left eval  Hip flexion    Hip extension    Hip abduction    Hip adduction    Hip internal rotation    Hip external rotation    Knee flexion    Knee extension    Ankle dorsiflexion    Ankle plantarflexion    Ankle inversion    Ankle eversion     (Blank rows = not tested)not tested 2nd to recent  surgery   FUNCTIONAL TESTS:  Able to get on and off the table without difficulty  GAIT: Did a good job with TDWB. She came in with good form. Following weight bearing precautions    TODAY'S TREATMENT: Exercises - Supine Heel Slide with Strap  - 1 x daily - 7 x weekly - 3 sets - 10 reps (advised just to get it moving. She dosent come back until next Friday we want some type of movement between now and then  - Supine Quad Set  - 1 x daily - 7 x weekly - 3 sets - 10 reps - Seated Ankle Pumps  - 1 x daily - 7 x weekly - 3 sets - 10 reps   PATIENT EDUCATION:  Education details: HEP; progression of weight bearing; swelling and symptom mangement  Person educated: Patient Education method: Explanation, Demonstration, Tactile cues, Verbal cues, and Handouts Education comprehension: verbalized understanding, returned demonstration, verbal cues required, tactile cues required, and needs further education   HOME EXERCISE PROGRAM: Access Code: [email or web address] URL: [email or web address] Date: 05/22/2022 Prepared by: Lorayne Bender  Exercises - Supine Heel Slide with Strap  - 1 x daily - 7 x weekly - 3 sets - 10 reps - Supine Quad Set  - 1 x daily - 7 x weekly - 3 sets - 10 reps - Seated Ankle Pumps  - 1 x daily - 7 x weekly - 3 sets - 10 reps  ASSESSMENT:  CLINICAL IMPRESSION: Patient is a 46 y.o. female who was seen today for physical therapy evaluation and treatment for meniscal debridement as well as a high tibial osteotomy. She presents with expected limitations in motion, function and strength. She has edema of the lower leg and knee. She is TDWB and doing well with her precautions. Therapy changed her wound dressing today. She will be WBAT 2 weeks post op per note. Patient will see MD first. She would benefit from skilled therapy to return to prior level of function and per patient speed walking.    OBJECTIVE IMPAIRMENTS Abnormal gait, decreased activity tolerance, difficulty  walking, decreased ROM, decreased strength, and pain.   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   REHAB POTENTIAL: Good  CLINICAL DECISION MAKING: Evolving/moderate complexity pain causing limited mobility   EVALUATION COMPLEXITY: Moderate   GOALS: Goals reviewed with patient? Yes  SHORT TERM GOALS: Target date: 06/12/2022  Patient will increase right knee flexion to 90 degrees  Baseline: Goal status:  INITIAL  2.  Patient will ambulate 500' with weight bearing without increased pain  Baseline:  Goal status: INITIAL  3.  Patient will be independent with basic HEP  Baseline:  Goal status: INITIAL  4.  Patient will transition to a cane when safe  Baseline:  Goal status: INITIAL   LONG TERM GOALS: Target date: 07/17/2022   Patient will go up/down a flight of steps without pain  Baseline:  Goal status: INITIAL  2.  Patient will stand for 1 hour without pain in order to perform ADL's  Baseline:  Goal status: INITIAL  3.  Patient will ambulate 3000' without self reported pain in order to perform ADL's  Baseline:  Goal status: INITIAL     PLAN: PT FREQUENCY: 2x/week  PT DURATION: 8 weeks  PLANNED INTERVENTIONS: Therapeutic exercises, Therapeutic activity, Neuromuscular re-education, Balance training, Gait training, Patient/Family education, Self Care, Joint mobilization, Stair training, DME instructions, Aquatic Therapy, Electrical stimulation, Cryotherapy, Moist heat, Taping, Ultrasound, and Manual therapy  PLAN FOR NEXT SESSION: begin with PROM progress to weight bearing exercises as tolerated. Consider SLR SL clamshell or seated hip abduction. Monitor pain  with activity.    Dessie Coma, PT 05/22/2022, 3:35 PM

## 2022-05-24 ENCOUNTER — Encounter: Payer: Self-pay | Admitting: Nurse Practitioner

## 2022-05-26 ENCOUNTER — Other Ambulatory Visit (HOSPITAL_BASED_OUTPATIENT_CLINIC_OR_DEPARTMENT_OTHER): Payer: Self-pay | Admitting: Orthopaedic Surgery

## 2022-05-26 MED ORDER — OXYCODONE HCL 5 MG PO TABS: 5.0000 mg | ORAL_TABLET | ORAL | 0 refills | Status: DC | PRN

## 2022-05-29 ENCOUNTER — Encounter (HOSPITAL_BASED_OUTPATIENT_CLINIC_OR_DEPARTMENT_OTHER): Payer: Self-pay

## 2022-05-29 ENCOUNTER — Ambulatory Visit (HOSPITAL_BASED_OUTPATIENT_CLINIC_OR_DEPARTMENT_OTHER): Payer: Medicaid Other | Admitting: Physical Therapy

## 2022-06-01 ENCOUNTER — Encounter (HOSPITAL_BASED_OUTPATIENT_CLINIC_OR_DEPARTMENT_OTHER): Payer: Medicaid Other | Admitting: Orthopaedic Surgery

## 2022-06-04 ENCOUNTER — Ambulatory Visit (INDEPENDENT_AMBULATORY_CARE_PROVIDER_SITE_OTHER): Payer: Medicaid Other

## 2022-06-04 ENCOUNTER — Other Ambulatory Visit (HOSPITAL_BASED_OUTPATIENT_CLINIC_OR_DEPARTMENT_OTHER): Payer: Self-pay

## 2022-06-04 ENCOUNTER — Ambulatory Visit (INDEPENDENT_AMBULATORY_CARE_PROVIDER_SITE_OTHER): Payer: Medicaid Other | Admitting: Orthopaedic Surgery

## 2022-06-04 DIAGNOSIS — M25561 Pain in right knee: Secondary | ICD-10-CM

## 2022-06-04 DIAGNOSIS — S82122A Displaced fracture of lateral condyle of left tibia, initial encounter for closed fracture: Secondary | ICD-10-CM | POA: Diagnosis not present

## 2022-06-04 MED ORDER — TRAMADOL HCL 50 MG PO TABS
50.0000 mg | ORAL_TABLET | Freq: Four times a day (QID) | ORAL | 0 refills | Status: DC | PRN
  Filled 2022-06-04: qty 30, 8d supply, fill #0

## 2022-06-04 NOTE — Addendum Note (Signed)
Addended by: Benancio Deeds on: 06/04/2022 09:49 AM   Modules accepted: Orders

## 2022-06-04 NOTE — Progress Notes (Signed)
Post Operative Evaluation    Procedure/Date of Surgery: Right knee tibial osteotomy with medial meniscal debridement 05/19/22  Interval History:   Presents today 2 weeks status post the above procedure.  Overall she is doing well.  She has been compliant with nonweightbearing status on the right leg.  She has been going to physical therapy and has been in her brace.  She is continuing to have pain particularly at night.  She has been just taking Tylenol.   PMH/PSH/Family History/Social History/Meds/Allergies:    Past Medical History:  Diagnosis Date   ADHD (attention deficit hyperactivity disorder)    Anxiety    Arthritis    Bipolar 1 disorder (HCC)    Brain injury (HCC) 2005   Glaucoma    Hypertension    no longer treated   Past Surgical History:  Procedure Laterality Date   BRAIN SURGERY  10/28/2003   from [facility]   KNEE ARTHROSCOPY WITH MENISCAL REPAIR Right 05/19/2022   Procedure: RIGHT KNEE ARTHROSCOPY, MEDIAL MENISCAL DEBRIDEMENT AND ABRASION ARTHROPLASTY;  Surgeon: Huel Cote, MD;  Location: [facility] OR;  Service: Orthopedics;  Laterality: Right;   TIBIA OSTEOTOMY Right 05/19/2022   Procedure: RIGHT KNEE HIGH TIBIAL OSTEOTOMY;  Surgeon: Huel Cote, MD;  Location: [facility] OR;  Service: Orthopedics;  Laterality: Right;   WISDOM TOOTH EXTRACTION     Social History   Socioeconomic History   Marital status: Single    Spouse name: Not on file   Number of children: 0   Years of education: Not on file   Highest education level: Bachelor's degree (e.g., BA, AB, BS)  Occupational History   Not on file  Tobacco Use   Smoking status: Former    Types: Cigarettes    Quit date: 06/13/2021    Years since quitting: 0.9   Smokeless tobacco: Never  Vaping Use   Vaping Use: Every day  Substance and Sexual Activity   Alcohol use: No   Drug use: No   Sexual activity: Not Currently    Birth control/protection: I.U.D.  Other Topics Concern   Not on file   Social History Narrative   Not on file   Social Determinants of Health   Financial Resource Strain: Medium Risk (01/21/2018)   Overall Financial Resource Strain (CARDIA)    Difficulty of Paying Living Expenses: Somewhat hard  Food Insecurity: No Food Insecurity (01/21/2018)   Hunger Vital Sign    Worried About Running Out of Food in the Last Year: Never true    Ran Out of Food in the Last Year: Never true  Transportation Needs: No Transportation Needs (01/21/2018)   PRAPARE - Administrator, Civil Service (Medical): No    Lack of Transportation (Non-Medical): No  Physical Activity: Inactive (01/21/2018)   Exercise Vital Sign    Days of Exercise per Week: 0 days    Minutes of Exercise per Session: 0 min  Stress: Stress Concern Present (01/21/2018)   [employer] of Occupational Health - Occupational Stress Questionnaire    Feeling of Stress : To some extent  Social Connections: Moderately Isolated (01/21/2018)   Social Connection and Isolation Panel [NHANES]    Frequency of Communication with Friends and Family: More than three times a week    Frequency of Social Gatherings with Friends and Family: More than three times  a week    Attends Religious Services: Never    Active Member of Clubs or Organizations: No    Attends Engineer, structural: Never    Marital Status: Never married   Family History  Problem Relation Age of Onset   Bipolar disorder Mother    Hypertension Mother    Miscarriages / [place] Mother    Vision loss Mother    Varicose Veins Mother    Alcohol abuse Father    Heart disease Father    Learning disabilities Father    Bipolar disorder Sister    Learning disabilities Sister    Diabetes Paternal Aunt    Bipolar disorder Maternal Grandmother    Hypertension Maternal Grandfather    Cancer Paternal Grandmother    Cancer Paternal Grandfather    No Known Allergies Current Outpatient Medications  Medication Sig Dispense Refill    acetaminophen (TYLENOL) 500 MG tablet Take 1,000 mg by mouth every 6 (six) hours as needed for mild pain.     ARIPiprazole (ABILIFY) 5 MG tablet Take 1 tablet by mouth once daily (Patient taking differently: Take 5 mg by mouth daily.) 90 tablet 0   aspirin EC 325 MG tablet Take 1 tablet (325 mg total) by mouth daily. 30 tablet 0   atomoxetine (STRATTERA) 40 MG capsule Take 1 capsule (40 mg total) by mouth daily. 90 capsule 0   citalopram (CELEXA) 20 MG tablet Take 1 tablet (20 mg total) by mouth daily. 90 tablet 0   clonazePAM (KLONOPIN) 0.5 MG tablet Take 1 tablet (0.5 mg total) by mouth 3 (three) times daily as needed for anxiety. 90 tablet 2   HYDROcodone-acetaminophen (NORCO/VICODIN) 5-325 MG tablet Take 1 tablet by mouth every 6 (six) hours as needed for moderate pain. 30 tablet 0   lamoTRIgine (LAMICTAL) 200 MG tablet Take 1 tablet (200 mg total) by mouth daily. 90 tablet 0   latanoprost (XALATAN) 0.005 % ophthalmic solution Place 1 drop into both eyes at bedtime.     oxyCODONE (ROXICODONE) 5 MG immediate release tablet Take 1 tablet (5 mg total) by mouth every 4 (four) hours as needed for severe pain or breakthrough pain. 30 tablet 0   traZODone (DESYREL) 150 MG tablet Take 1 tablet (150 mg total) by mouth at bedtime. 90 tablet 0   No current facility-administered medications for this visit.   No results found.  Review of Systems:   A ROS was performed including pertinent positives and negatives as documented in the HPI.   Musculoskeletal Exam:    There were no vitals taken for this visit.  Right leg with some swelling compared to the contralateral side.  There is minimal to little calf tenderness.  Incisions are well-appearing.  Range of motion of the knee is approximately 5 degrees to 90.  Sensation is intact in all distributions.  Fires tibialis anterior as well as EHL and gastrocsoleus.  2+ dorsalis pedis pulse  Imaging:    Limb length views right leg: She now has an  approximately neutral axis with plumbline running through the medial tibial spine  I personally reviewed and interpreted the radiographs.   Assessment:   46 year old female 2 weeks status post right knee high tibial osteotomy overall doing well.  At this time I would like her to continue to work on swelling control with elevation and icing.  She will begin 50% weightbearing and attempt to progress to full weightbearing over the course the next 2 weeks.  I would like her to  be in a brace for the next 2 weeks.  I would like to see her back in 2 weeks for reassessment  Plan :    -Return to clinic in 2 weeks for reassessment      I personally saw and evaluated the patient, and participated in the management and treatment plan.  Huel Cote, MD Attending Physician, Orthopedic Surgery  This document was dictated using Dragon voice recognition software. A reasonable attempt at proof reading has been made to minimize errors.

## 2022-06-05 ENCOUNTER — Ambulatory Visit (HOSPITAL_BASED_OUTPATIENT_CLINIC_OR_DEPARTMENT_OTHER): Payer: Medicaid Other | Admitting: Physical Therapy

## 2022-06-05 ENCOUNTER — Encounter (HOSPITAL_BASED_OUTPATIENT_CLINIC_OR_DEPARTMENT_OTHER): Payer: Self-pay | Admitting: Physical Therapy

## 2022-06-05 DIAGNOSIS — G8929 Other chronic pain: Secondary | ICD-10-CM

## 2022-06-05 DIAGNOSIS — R600 Localized edema: Secondary | ICD-10-CM

## 2022-06-05 DIAGNOSIS — R2689 Other abnormalities of gait and mobility: Secondary | ICD-10-CM

## 2022-06-05 DIAGNOSIS — M25661 Stiffness of right knee, not elsewhere classified: Secondary | ICD-10-CM

## 2022-06-05 DIAGNOSIS — M25561 Pain in right knee: Secondary | ICD-10-CM | POA: Diagnosis not present

## 2022-06-05 NOTE — Therapy (Signed)
OUTPATIENT PHYSICAL THERAPY LOWER EXTREMITY EVALUATION   Patient Name: Sarah Salas MRN: [id number] DOB:Feb 11, 1976, 46 y.o., female Today's Date: 06/05/2022   PT End of Session - 06/05/22 1100     Visit Number 2    Number of Visits 16    Date for PT Re-Evaluation 07/17/22    Authorization Type [employer]    PT Start Time 1020    PT Stop Time 1100    PT Time Calculation (min) 40 min    Activity Tolerance Patient tolerated treatment well    Behavior During Therapy [facility] for tasks assessed/performed;Impulsive              Past Medical History:  Diagnosis Date   ADHD (attention deficit hyperactivity disorder)    Anxiety    Arthritis    Bipolar 1 disorder (HCC)    Brain injury (HCC) 2005   Glaucoma    Hypertension    no longer treated   Past Surgical History:  Procedure Laterality Date   BRAIN SURGERY  10/28/2003   from [facility]   KNEE ARTHROSCOPY WITH MENISCAL REPAIR Right 05/19/2022   Procedure: RIGHT KNEE ARTHROSCOPY, MEDIAL MENISCAL DEBRIDEMENT AND ABRASION ARTHROPLASTY;  Surgeon: Huel Cote, MD;  Location: [facility] OR;  Service: Orthopedics;  Laterality: Right;   TIBIA OSTEOTOMY Right 05/19/2022   Procedure: RIGHT KNEE HIGH TIBIAL OSTEOTOMY;  Surgeon: Huel Cote, MD;  Location: [facility] OR;  Service: Orthopedics;  Laterality: Right;   WISDOM TOOTH EXTRACTION     Patient Active Problem List   Diagnosis Date Noted   Closed fracture of right tibial plateau with malunion 05/19/2022   Anxiety 11/08/2017   Bipolar affective disorder (HCC) 11/05/2017    PCP: Loreen Freud MD  REFERRING PROVIDER: Dr Huel Cote   REFERRING DIAG: R knee high tibial osteotomy, meniscal debridement, abrasion arthroplasty  THERAPY DIAG:  Chronic pain of right knee  Stiffness of right knee, not elsewhere classified  Other abnormalities of gait and mobility  Localized edema  Rationale for Evaluation and Treatment Rehabilitation  ONSET DATE: 05/19/2022  SUBJECTIVE:   SUBJECTIVE  STATEMENT: The doctor updated my pain medicine yesterday so I can sleep better. Saw the doctor yesterday, have been working on my own exercises at home just to try. Have been working on what Theodoro Grist gave me too. I have to scoot up the stairs on my butt.   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 Yes: NPRS scale: 5/10 Pain location: R knee  Pain description: aching and sharp  Aggravating factors: standing and moving  Relieving factors: rest   PRECAUTIONS: None  WEIGHT BEARING RESTRICTIONS Yes 50% WBAT now progress to WBAT in 2 weeks from 8/24  FALLS:  Has patient fallen in last 6 months? No  LIVING ENVIRONMENT: 2 level house; coming down the steps she has figured out a way to do it that is less painful   OCCUPATION: On [employer] 2nd to traumatic brain injury   PLOF: Independent with pain   PATIENT GOALS  Back to speed walking >1 mile    OBJECTIVE:   DIAGNOSTIC FINDINGS: Nothing post op   PATIENT SURVEYS:  FOTO not set up   COGNITION:  Overall cognitive status: Within functional limits for tasks assessed     SENSATION: WFL  EDEMA:  Circumferential: noticeable edema will measure next visit  POSTURE: rounded shoulders  PALPATION: No unexpected tenderness to palpation   LOWER EXTREMITY ROM:  Passive ROM Right eval Left  eval  Hip flexion    Hip extension    Hip abduction    Hip adduction    Hip internal rotation    Hip external rotation    Knee flexion 38   Knee extension 0   Ankle dorsiflexion    Ankle plantarflexion    Ankle inversion    Ankle eversion     (Blank rows = not tested)  LOWER EXTREMITY MMT:  MMT Right eval Left eval  Hip flexion    Hip extension    Hip abduction    Hip adduction    Hip internal rotation    Hip external rotation    Knee flexion    Knee extension    Ankle dorsiflexion    Ankle plantarflexion    Ankle inversion    Ankle eversion     (Blank rows = not  tested)not tested 2nd to recent surgery   FUNCTIONAL TESTS:  Able to get on and off the table without difficulty  GAIT: Did a good job with TDWB. She came in with good form. Following weight bearing precautions    TODAY'S TREATMENT:  8/25  Knee PROM to 90 degrees  Quad sets x20 R LE Patella mobs all directions HS stretch 3x30 seconds  Supine hip ABD no brace 1x15  SLRs with brace on 2x10 close min guard from PT Sidelying hip ABD 2x10 with brace on  Prone hip extensions 2x10 with brace on   Education about safe transfers and brace placement/brace alignment and fit- adjusted brace as appropriate   Eval Exercises - Supine Heel Slide with Strap  - 1 x daily - 7 x weekly - 3 sets - 10 reps (advised just to get it moving. She dosent come back until next Friday we want some type of movement between now and then  - Supine Quad Set  - 1 x daily - 7 x weekly - 3 sets - 10 reps - Seated Ankle Pumps  - 1 x daily - 7 x weekly - 3 sets - 10 reps   PATIENT EDUCATION:  Education details: HEP; progression of weight bearing; swelling and symptom mangement  Person educated: Patient Education method: Explanation, Demonstration, Tactile cues, Verbal cues, and Handouts Education comprehension: verbalized understanding, returned demonstration, verbal cues required, tactile cues required, and needs further education   HOME EXERCISE PROGRAM: Access Code: [email or web address] URL: [email or web address] Date: 05/22/2022 Prepared by: Lorayne Bender  Exercises - Supine Heel Slide with Strap  - 1 x daily - 7 x weekly - 3 sets - 10 reps - Supine Quad Set  - 1 x daily - 7 x weekly - 3 sets - 10 reps - Seated Ankle Pumps  - 1 x daily - 7 x weekly - 3 sets - 10 reps  ASSESSMENT:  CLINICAL IMPRESSION:  Nury arrives doing OK today, saw MD  yesterday and he is happy with her progress and updated WB precautions- will be WBAT in 2 weeks. We worked on ROM and strengthening as appropriate at this point  in her recovery post-op. Reviewed safe transfers as well, was a bit impulsive and very unsafe with transfer from low couch entering clinic. Will continue to progress as able and tolerated.   OBJECTIVE IMPAIRMENTS Abnormal gait, decreased activity tolerance, difficulty walking, decreased ROM, decreased strength, and pain.   ACTIVITY LIMITATIONS carrying, lifting, bending, sitting, standing, squatting, sleeping, stairs, transfers, dressing, and locomotion level  PARTICIPATION LIMITATIONS: meal prep, cleaning, laundry, driving, shopping, community activity, and yard work  PERSONAL FACTORS  1-2 comorbidities: old TBI bi polar  are also affecting patient's functional outcome.   REHAB POTENTIAL: Good  CLINICAL DECISION MAKING: Evolving/moderate complexity pain causing limited mobility   EVALUATION COMPLEXITY: Moderate   GOALS: Goals reviewed with patient? Yes  SHORT TERM GOALS: Target date: 06/26/2022  Patient will increase right knee flexion to 90 degrees  Baseline: Goal status: INITIAL  2.  Patient will ambulate 500' with weight bearing without increased pain  Baseline:  Goal status: INITIAL  3.  Patient will be independent with basic HEP  Baseline:  Goal status: INITIAL  4.  Patient will transition to a cane when safe  Baseline:  Goal status: INITIAL   LONG TERM GOALS: Target date: 07/31/2022   Patient will go up/down a flight of steps without pain  Baseline:  Goal status: INITIAL  2.  Patient will stand for 1 hour without pain in order to perform ADL's  Baseline:  Goal status: INITIAL  3.  Patient will ambulate 3000' without self reported pain in order to perform ADL's  Baseline:  Goal status: INITIAL     PLAN: PT FREQUENCY: 2x/week  PT DURATION: 8 weeks  PLANNED INTERVENTIONS: Therapeutic exercises, Therapeutic activity, Neuromuscular re-education, Balance training, Gait training, Patient/Family education, Self Care, Joint mobilization, Stair training, DME  instructions, Aquatic Therapy, Electrical stimulation, Cryotherapy, Moist heat, Taping, Ultrasound, and Manual therapy  PLAN FOR NEXT SESSION: begin with PROM progress to weight bearing exercises as tolerated. Consider SLR SL clamshell or seated hip abduction. Monitor pain with activity.    Ottie Neglia U PT DPT PN2  06/05/2022, 11:00 AM

## 2022-06-11 ENCOUNTER — Ambulatory Visit (HOSPITAL_BASED_OUTPATIENT_CLINIC_OR_DEPARTMENT_OTHER): Payer: Medicaid Other | Admitting: Physical Therapy

## 2022-06-11 DIAGNOSIS — M25661 Stiffness of right knee, not elsewhere classified: Secondary | ICD-10-CM

## 2022-06-11 DIAGNOSIS — R600 Localized edema: Secondary | ICD-10-CM

## 2022-06-11 DIAGNOSIS — R2689 Other abnormalities of gait and mobility: Secondary | ICD-10-CM

## 2022-06-11 DIAGNOSIS — M25561 Pain in right knee: Secondary | ICD-10-CM | POA: Diagnosis not present

## 2022-06-11 DIAGNOSIS — G8929 Other chronic pain: Secondary | ICD-10-CM

## 2022-06-11 NOTE — Therapy (Signed)
OUTPATIENT PHYSICAL THERAPY LOWER EXTREMITY Treatment    Patient Name: Sarah Salas MRN: [id number] DOB:April 02, 1976, 46 y.o., female Today's Date: 06/11/2022   PT End of Session - 06/11/22 1007     Visit Number 3    Number of Visits 16    Date for PT Re-Evaluation 07/17/22    Authorization Type [employer]    PT Start Time 0845    PT Stop Time 0925    PT Time Calculation (min) 40 min    Activity Tolerance Patient tolerated treatment well    Behavior During Therapy [facility] for tasks assessed/performed;Impulsive               Past Medical History:  Diagnosis Date   ADHD (attention deficit hyperactivity disorder)    Anxiety    Arthritis    Bipolar 1 disorder (HCC)    Brain injury (HCC) 2005   Glaucoma    Hypertension    no longer treated   Past Surgical History:  Procedure Laterality Date   BRAIN SURGERY  10/28/2003   from [facility]   KNEE ARTHROSCOPY WITH MENISCAL REPAIR Right 05/19/2022   Procedure: RIGHT KNEE ARTHROSCOPY, MEDIAL MENISCAL DEBRIDEMENT AND ABRASION ARTHROPLASTY;  Surgeon: Huel Cote, MD;  Location: [facility] OR;  Service: Orthopedics;  Laterality: Right;   TIBIA OSTEOTOMY Right 05/19/2022   Procedure: RIGHT KNEE HIGH TIBIAL OSTEOTOMY;  Surgeon: Huel Cote, MD;  Location: [facility] OR;  Service: Orthopedics;  Laterality: Right;   WISDOM TOOTH EXTRACTION     Patient Active Problem List   Diagnosis Date Noted   Closed fracture of right tibial plateau with malunion 05/19/2022   Anxiety 11/08/2017   Bipolar affective disorder (HCC) 11/05/2017    PCP: Loreen Freud MD  REFERRING PROVIDER: Dr Huel Cote   REFERRING DIAG: R knee high tibial osteotomy, meniscal debridement, abrasion arthroplasty  THERAPY DIAG:  Chronic pain of right knee  Stiffness of right knee, not elsewhere classified  Other abnormalities of gait and mobility  Localized edema  Rationale for Evaluation and Treatment Rehabilitation  ONSET DATE: 05/19/2022  SUBJECTIVE:   SUBJECTIVE  STATEMENT: The patient reports she is doing much better. Her brace is still slipping. She has been putting 50% weight bearing on it.  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 Yes: NPRS scale: 5/10 Pain location: R knee  Pain description: aching and sharp  Aggravating factors: standing and moving  Relieving factors: rest   PRECAUTIONS: None  WEIGHT BEARING RESTRICTIONS Yes 50% WBAT now progress to WBAT in 2 weeks from 8/24  FALLS:  Has patient fallen in last 6 months? No  LIVING ENVIRONMENT: 2 level house; coming down the steps she has figured out a way to do it that is less painful   OCCUPATION: On [employer] 2nd to traumatic brain injury   PLOF: Independent with pain   PATIENT GOALS  Back to speed walking >1 mile    OBJECTIVE:   DIAGNOSTIC FINDINGS: Nothing post op   PATIENT SURVEYS:  FOTO not set up   COGNITION:  Overall cognitive status: Within functional limits for tasks assessed     SENSATION: WFL  EDEMA:  Circumferential: noticeable edema will measure next visit  POSTURE: rounded shoulders  PALPATION: No unexpected tenderness to palpation   LOWER EXTREMITY ROM:  Passive ROM Right eval Left eval Right   Hip flexion     Hip extension     Hip abduction     Hip adduction  Hip internal rotation     Hip external rotation     Knee flexion 38  95  Knee extension 0  0  Ankle dorsiflexion     Ankle plantarflexion     Ankle inversion     Ankle eversion      (Blank rows = not tested)  LOWER EXTREMITY MMT:  MMT Right eval Left eval  Hip flexion    Hip extension    Hip abduction    Hip adduction    Hip internal rotation    Hip external rotation    Knee flexion    Knee extension    Ankle dorsiflexion    Ankle plantarflexion    Ankle inversion    Ankle eversion     (Blank rows = not tested)not tested 2nd to recent surgery   FUNCTIONAL TESTS:  Able to get on and off the table  without difficulty  GAIT: Did a good job with TDWB. She came in with good form. Following weight bearing precautions    TODAY'S TREATMENT: 8/31 Manual:Knee PROM to 90 degrees Patella mobs all directions; trigger point release to IT band   Quad sets x20 R LE  SLR with guarding 3x10   Standing hip 3 way x20   Reviewed fitting of the brace and re-fit the brace for home. Reviewed how to do the straps at home if it slips  Stair training: reviewed going up/down steps while maintaining       8/25  Knee PROM to 90 degrees  Quad sets x20 R LE Patella mobs all directions HS stretch 3x30 seconds  Supine hip ABD no brace 1x15  SLRs with brace on 2x10 close min guard from PT Sidelying hip ABD 2x10 with brace on  Prone hip extensions 2x10 with brace on   Education about safe transfers and brace placement/brace alignment and fit- adjusted brace as appropriate   Eval Exercises - Supine Heel Slide with Strap  - 1 x daily - 7 x weekly - 3 sets - 10 reps (advised just to get it moving. She dosent come back until next Friday we want some type of movement between now and then  - Supine Quad Set  - 1 x daily - 7 x weekly - 3 sets - 10 reps - Seated Ankle Pumps  - 1 x daily - 7 x weekly - 3 sets - 10 reps   PATIENT EDUCATION:  Education details: HEP; progression of weight bearing; swelling and symptom mangement  Person educated: Patient Education method: Explanation, Demonstration, Tactile cues, Verbal cues, and Handouts Education comprehension: verbalized understanding, returned demonstration, verbal cues required, tactile cues required, and needs further education   HOME EXERCISE PROGRAM: Access Code: [email or web address] URL: [email or web address] Date: 05/22/2022 Prepared by: Lorayne Bender  Exercises - Supine Heel Slide with Strap  - 1 x daily - 7 x weekly - 3 sets - 10 reps - Supine Quad Set  - 1 x daily - 7 x weekly - 3 sets - 10 reps - Seated Ankle Pumps  - 1 x daily -  7 x weekly - 3 sets - 10 reps  ASSESSMENT:  CLINICAL IMPRESSION:  The patient continues to make good progress. Her range was measured at 0-95. She was shown how to do self stretching at home. She is not walking much at her house because she is stuck upstairs without butt scooting. We reviewed technique with steps. She did well. Our railings are a it wide but she was able to go up  and down 2 steps while maintaining weight bearing. We reviewed a standing series of exercise.  OBJECTIVE IMPAIRMENTS Abnormal gait, decreased activity tolerance, difficulty walking, decreased ROM, decreased strength, and pain.   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   REHAB POTENTIAL: Good  CLINICAL DECISION MAKING: Evolving/moderate complexity pain causing limited mobility   EVALUATION COMPLEXITY: Moderate   GOALS: Goals reviewed with patient? Yes  SHORT TERM GOALS: Target date: 07/02/2022  Patient will increase right knee flexion to 90 degrees  Baseline: Goal status: INITIAL  2.  Patient will ambulate 500' with weight bearing without increased pain  Baseline:  Goal status: INITIAL  3.  Patient will be independent with basic HEP  Baseline:  Goal status: INITIAL  4.  Patient will transition to a cane when safe  Baseline:  Goal status: INITIAL   LONG TERM GOALS: Target date: 08/06/2022   Patient will go up/down a flight of steps without pain  Baseline:  Goal status: INITIAL  2.  Patient will stand for 1 hour without pain in order to perform ADL's  Baseline:  Goal status: INITIAL  3.  Patient will ambulate 3000' without self reported pain in order to perform ADL's  Baseline:   Goal status: INITIAL     PLAN: PT FREQUENCY: 2x/week  PT DURATION: 8 weeks  PLANNED INTERVENTIONS: Therapeutic exercises, Therapeutic activity, Neuromuscular re-education, Balance training, Gait training, Patient/Family education, Self Care, Joint mobilization, Stair training, DME instructions, Aquatic Therapy, Electrical stimulation, Cryotherapy, Moist heat, Taping, Ultrasound, and Manual therapy  PLAN FOR NEXT SESSION: begin with PROM progress to weight bearing exercises as tolerated. Consider SLR SL clamshell or seated hip abduction. Monitor pain with activity.    Lorayne Bender PT SPT  06/11/2022, 10:10 AM

## 2022-06-17 ENCOUNTER — Ambulatory Visit (HOSPITAL_BASED_OUTPATIENT_CLINIC_OR_DEPARTMENT_OTHER): Payer: Medicaid Other | Attending: Orthopaedic Surgery | Admitting: Physical Therapy

## 2022-06-17 ENCOUNTER — Ambulatory Visit (INDEPENDENT_AMBULATORY_CARE_PROVIDER_SITE_OTHER): Payer: Medicaid Other | Admitting: Orthopaedic Surgery

## 2022-06-17 ENCOUNTER — Encounter (HOSPITAL_BASED_OUTPATIENT_CLINIC_OR_DEPARTMENT_OTHER): Payer: Self-pay | Admitting: Physical Therapy

## 2022-06-17 ENCOUNTER — Ambulatory Visit (INDEPENDENT_AMBULATORY_CARE_PROVIDER_SITE_OTHER): Payer: Medicaid Other

## 2022-06-17 DIAGNOSIS — M21161 Varus deformity, not elsewhere classified, right knee: Secondary | ICD-10-CM

## 2022-06-17 DIAGNOSIS — M25661 Stiffness of right knee, not elsewhere classified: Secondary | ICD-10-CM | POA: Insufficient documentation

## 2022-06-17 DIAGNOSIS — G8929 Other chronic pain: Secondary | ICD-10-CM | POA: Diagnosis present

## 2022-06-17 DIAGNOSIS — R600 Localized edema: Secondary | ICD-10-CM | POA: Diagnosis not present

## 2022-06-17 DIAGNOSIS — R2689 Other abnormalities of gait and mobility: Secondary | ICD-10-CM | POA: Diagnosis present

## 2022-06-17 DIAGNOSIS — M25561 Pain in right knee: Secondary | ICD-10-CM | POA: Diagnosis present

## 2022-06-17 NOTE — Progress Notes (Signed)
Post Operative Evaluation    Procedure/Date of Surgery: Right knee tibial osteotomy with medial meniscal debridement 05/19/22  Interval History:   Analyssa presents today for follow-up of her right tibial high tibial osteotomy.  She is 4 weeks out.  He is continuing to make significant improvement with range of motion and weightbearing of the right leg.  She is now walking with a cane.  She states that she does not have any pain in her knee whatsoever.  There is some tenderness about the tibia distally and there is mild swelling about her anterior muscular compartment.  PMH/PSH/Family History/Social History/Meds/Allergies:    Past Medical History:  Diagnosis Date   ADHD (attention deficit hyperactivity disorder)    Anxiety    Arthritis    Bipolar 1 disorder (HCC)    Brain injury (HCC) 2005   Glaucoma    Hypertension    no longer treated   Past Surgical History:  Procedure Laterality Date   BRAIN SURGERY  10/28/2003   from [facility]   KNEE ARTHROSCOPY WITH MENISCAL REPAIR Right 05/19/2022   Procedure: RIGHT KNEE ARTHROSCOPY, MEDIAL MENISCAL DEBRIDEMENT AND ABRASION ARTHROPLASTY;  Surgeon: Huel Cote, MD;  Location: [facility] OR;  Service: Orthopedics;  Laterality: Right;   TIBIA OSTEOTOMY Right 05/19/2022   Procedure: RIGHT KNEE HIGH TIBIAL OSTEOTOMY;  Surgeon: Huel Cote, MD;  Location: [facility] OR;  Service: Orthopedics;  Laterality: Right;   WISDOM TOOTH EXTRACTION     Social History   Socioeconomic History   Marital status: Single    Spouse name: Not on file   Number of children: 0   Years of education: Not on file   Highest education level: Bachelor's degree (e.g., BA, AB, BS)  Occupational History   Not on file  Tobacco Use   Smoking status: Former    Types: Cigarettes    Quit date: 06/13/2021    Years since quitting: 1.0   Smokeless tobacco: Never  Vaping Use   Vaping Use: Every day  Substance and Sexual Activity   Alcohol use: No   Drug use:  No   Sexual activity: Not Currently    Birth control/protection: I.U.D.  Other Topics Concern   Not on file  Social History Narrative   Not on file   Social Determinants of Health   Financial Resource Strain: Medium Risk (01/21/2018)   Overall Financial Resource Strain (CARDIA)    Difficulty of Paying Living Expenses: Somewhat hard  Food Insecurity: No Food Insecurity (01/21/2018)   Hunger Vital Sign    Worried About Running Out of Food in the Last Year: Never true    Ran Out of Food in the Last Year: Never true  Transportation Needs: No Transportation Needs (01/21/2018)   PRAPARE - Administrator, Civil Service (Medical): No    Lack of Transportation (Non-Medical): No  Physical Activity: Inactive (01/21/2018)   Exercise Vital Sign    Days of Exercise per Week: 0 days    Minutes of Exercise per Session: 0 min  Stress: Stress Concern Present (01/21/2018)   [facility] - Occupational Stress Questionnaire    Feeling of Stress : To some extent  Social Connections: Moderately Isolated (01/21/2018)   Social Connection and Isolation Panel [NHANES]    Frequency of Communication with Friends and Family: More than three  times a week    Frequency of Social Gatherings with Friends and Family: More than three times a week    Attends Religious Services: Never    Database administrator or Organizations: No    Attends Engineer, structural: Never    Marital Status: Never married   Family History  Problem Relation Age of Onset   Bipolar disorder Mother    Hypertension Mother    Miscarriages / [place] Mother    Vision loss Mother    Varicose Veins Mother    Alcohol abuse Father    Heart disease Father    Learning disabilities Father    Bipolar disorder Sister    Learning disabilities Sister    Diabetes Paternal Aunt    Bipolar disorder Maternal Grandmother    Hypertension Maternal Grandfather    Cancer Paternal Grandmother    Cancer  Paternal Grandfather    No Known Allergies Current Outpatient Medications  Medication Sig Dispense Refill   acetaminophen (TYLENOL) 500 MG tablet Take 1,000 mg by mouth every 6 (six) hours as needed for mild pain.     ARIPiprazole (ABILIFY) 5 MG tablet Take 1 tablet by mouth once daily (Patient taking differently: Take 5 mg by mouth daily.) 90 tablet 0   aspirin EC 325 MG tablet Take 1 tablet (325 mg total) by mouth daily. 30 tablet 0   atomoxetine (STRATTERA) 40 MG capsule Take 1 capsule (40 mg total) by mouth daily. 90 capsule 0   citalopram (CELEXA) 20 MG tablet Take 1 tablet (20 mg total) by mouth daily. 90 tablet 0   clonazePAM (KLONOPIN) 0.5 MG tablet Take 1 tablet (0.5 mg total) by mouth 3 (three) times daily as needed for anxiety. 90 tablet 2   HYDROcodone-acetaminophen (NORCO/VICODIN) 5-325 MG tablet Take 1 tablet by mouth every 6 (six) hours as needed for moderate pain. 30 tablet 0   lamoTRIgine (LAMICTAL) 200 MG tablet Take 1 tablet (200 mg total) by mouth daily. 90 tablet 0   latanoprost (XALATAN) 0.005 % ophthalmic solution Place 1 drop into both eyes at bedtime.     oxyCODONE (ROXICODONE) 5 MG immediate release tablet Take 1 tablet (5 mg total) by mouth every 4 (four) hours as needed for severe pain or breakthrough pain. 30 tablet 0   traMADol (ULTRAM) 50 MG tablet Take 1 tablet (50 mg total) by mouth every 6 (six) hours as needed. 30 tablet 0   traZODone (DESYREL) 150 MG tablet Take 1 tablet (150 mg total) by mouth at bedtime. 90 tablet 0   No current facility-administered medications for this visit.   No results found.  Review of Systems:   A ROS was performed including pertinent positives and negatives as documented in the HPI.   Musculoskeletal Exam:    There were no vitals taken for this visit.  Right leg with mild swelling compared to the contralateral side.  No calf tenderness or palpable cord.  Incisions are well-appearing.  0 degrees to 100 degrees.  There is no  medial or lateral joint line tenderness.  Sensation is intact in all distributions.  Fires tibialis anterior as well as EHL and gastrocsoleus.  2+ dorsalis pedis pulse  Imaging:    Limb length views right leg: She now has an approximately neutral axis with plumbline running through the medial tibial spine  X-ray right leg 3 views: There is a lucency near the osteotomy although it is difficult to discern if there is a fracture line as there is  not any type of intraoperative fracture and this is possibly just an area of lucency next to the fibula.    I personally reviewed and interpreted the radiographs.   Assessment:   46 year old female 4 weeks status post right knee high tibial osteotomy overall doing well.  At this time I would like her to continue to work on swelling control with elevation and icing.  She has progressed her weightbearing to 50% of this time and we will continue to progress her to as tolerated at this point.  I did discuss that her radiographs were read as a fracture although I am not completely certain of this.  Our x-rays that we have obtained are not joint line views consistent with what was obtained in the operating room.  I did discuss that there was not any type of fracture intraoperatively.  It is possible that this could have occurred postoperatively although she does not have any joint line tenderness.  At this point I am favoring on the x-ray more lucency visualized next to the fibula.  I will plan to obtain a perfect joint line view at the next follow-up visit.  That being said her alignment is overall quite good and she is now 4 weeks out from surgery.  To that effect I do believe that she may begin to advance her weightbearing to as tolerated..  I will plan to see her back in 2 weeks for recheck.  Plan :    -Return to clinic in 2 weeks for reassessment      I personally saw and evaluated the patient, and participated in the management and treatment  plan.  Huel Cote, MD Attending Physician, Orthopedic Surgery  This document was dictated using Dragon voice recognition software. A reasonable attempt at proof reading has been made to minimize errors.

## 2022-06-17 NOTE — Therapy (Signed)
OUTPATIENT PHYSICAL THERAPY LOWER EXTREMITY Treatment    Patient Name: Sarah Salas MRN: [id number] DOB:May 20, 1976, 46 y.o., female Today's Date: 06/17/2022   PT End of Session - 06/17/22 1601     Visit Number 4    Number of Visits 16    Date for PT Re-Evaluation 07/17/22    Authorization Type [employer]    PT Start Time 1430    PT Stop Time 1513    PT Time Calculation (min) 43 min    Activity Tolerance Patient tolerated treatment well    Behavior During Therapy [facility] for tasks assessed/performed;Impulsive                Past Medical History:  Diagnosis Date   ADHD (attention deficit hyperactivity disorder)    Anxiety    Arthritis    Bipolar 1 disorder (HCC)    Brain injury (HCC) 2005   Glaucoma    Hypertension    no longer treated   Past Surgical History:  Procedure Laterality Date   BRAIN SURGERY  10/28/2003   from [facility]   KNEE ARTHROSCOPY WITH MENISCAL REPAIR Right 05/19/2022   Procedure: RIGHT KNEE ARTHROSCOPY, MEDIAL MENISCAL DEBRIDEMENT AND ABRASION ARTHROPLASTY;  Surgeon: Huel Cote, MD;  Location: [facility] OR;  Service: Orthopedics;  Laterality: Right;   TIBIA OSTEOTOMY Right 05/19/2022   Procedure: RIGHT KNEE HIGH TIBIAL OSTEOTOMY;  Surgeon: Huel Cote, MD;  Location: [facility] OR;  Service: Orthopedics;  Laterality: Right;   WISDOM TOOTH EXTRACTION     Patient Active Problem List   Diagnosis Date Noted   Closed fracture of right tibial plateau with malunion 05/19/2022   Anxiety 11/08/2017   Bipolar affective disorder (HCC) 11/05/2017    PCP: Loreen Freud MD  REFERRING PROVIDER: Dr Huel Cote   REFERRING DIAG: R knee high tibial osteotomy, meniscal debridement, abrasion arthroplasty  THERAPY DIAG:  Chronic pain of right knee  Stiffness of right knee, not elsewhere classified  Other abnormalities of gait and mobility  Localized edema  Rationale for Evaluation and Treatment Rehabilitation  ONSET DATE: 05/19/2022  SUBJECTIVE:   SUBJECTIVE  STATEMENT: The spoke with th MD who will allow the patient to take the brace off and use a cane. She has been using her cane for 2 days nw. She reports it is swollen since she started using the cane. She is using it on te wrong side. 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 Yes: NPRS scale: 5/10 Pain location: R knee  Pain description: aching and sharp  Aggravating factors: standing and moving  Relieving factors: rest   PRECAUTIONS: None  WEIGHT BEARING RESTRICTIONS Yes 50% WBAT now progress to WBAT in 2 weeks from 8/24  FALLS:  Has patient fallen in last 6 months? No  LIVING ENVIRONMENT: 2 level house; coming down the steps she has figured out a way to do it that is less painful   OCCUPATION: On [employer]SI 2nd to traumatic brain injury   PLOF: Independent with pain   PATIENT GOALS  Back to speed walking >1 mile    OBJECTIVE:   DIAGNOSTIC FINDINGS: Nothing post op   PATIENT SURVEYS:  FOTO not set up   COGNITION:  Overall cognitive status: Within functional limits for tasks assessed     SENSATION: WFL  EDEMA:  Circumferential: noticeable edema will measure next visit  POSTURE: rounded shoulders  PALPATION: No unexpected tenderness to palpation   LOWER EXTREMITY ROM:  Passive ROM Right  eval Left eval Right   Hip flexion     Hip extension     Hip abduction     Hip adduction     Hip internal rotation     Hip external rotation     Knee flexion 38  95  Knee extension 0  0  Ankle dorsiflexion     Ankle plantarflexion     Ankle inversion     Ankle eversion      (Blank rows = not tested)  LOWER EXTREMITY MMT:  MMT Right eval Left eval  Hip flexion    Hip extension    Hip abduction    Hip adduction    Hip internal rotation    Hip external rotation    Knee flexion    Knee extension    Ankle dorsiflexion    Ankle plantarflexion    Ankle inversion    Ankle eversion     (Blank rows = not  tested)not tested 2nd to recent surgery   FUNCTIONAL TESTS:  Able to get on and off the table without difficulty  GAIT: Did a good job with TDWB. She came in with good form. Following weight bearing precautions    TODAY'S TREATMENT: 9/6 Manual:Knee PROM to 90 degrees Patella mobs all directions; trigger point release to IT band   Quad sets x20 R LE  SLR with guarding 3x10   SAQ 3x10   Standing forward weight shift x20   Reviewed proper use of the cane with significant improvement in gait     8/31 Manual:Knee PROM to 90 degrees Patella mobs all directions; trigger point release to IT band   Quad sets x20 R LE  SLR with guarding 3x10   Standing hip 3 way x20   Reviewed fitting of the brace and re-fit the brace for home. Reviewed how to do the straps at home if it slips  Stair training: reviewed going up/down steps while maintaining       8/25  Knee PROM to 90 degrees  Quad sets x20 R LE Patella mobs all directions HS stretch 3x30 seconds  Supine hip ABD no brace 1x15  SLRs with brace on 2x10 close min guard from PT Sidelying hip ABD 2x10 with brace on  Prone hip extensions 2x10 with brace on   Education about safe transfers and brace placement/brace alignment and fit- adjusted brace as appropriate   Eval Exercises - Supine Heel Slide with Strap  - 1 x daily - 7 x weekly - 3 sets - 10 reps (advised just to get it moving. She dosent come back until next Friday we want some type of movement between now and then  - Supine Quad Set  - 1 x daily - 7 x weekly - 3 sets - 10 reps - Seated Ankle Pumps  - 1 x daily - 7 x weekly - 3 sets - 10 reps   PATIENT EDUCATION:  Education details: HEP; progression of weight bearing; swelling and symptom mangement  Person educated: Patient Education method: Explanation, Demonstration, Tactile cues, Verbal cues, and Handouts Education comprehension: verbalized understanding, returned demonstration, verbal cues required,  tactile cues required, and needs further education   HOME EXERCISE PROGRAM: Access Code: [email or web address] URL: [email or web address] Date: 05/22/2022 Prepared by: Lorayne Bender  Exercises - Supine Heel Slide with Strap  - 1 x daily - 7 x weekly - 3 sets - 10 reps - Supine Quad Set  - 1 x daily - 7 x weekly - 3 sets - 10  reps - Seated Ankle Pumps  - 1 x daily - 7 x weekly - 3 sets - 10 reps  ASSESSMENT:  CLINICAL IMPRESSION: The patient tolerated treatment well. She came in with a moderate increase in pain and swelling 2nd to her transition to the cane. We reviewed proper use of the cane which improved her gait and balance. She tolerated there-ex well. We will continue to progress weight bearing activity as tolerated.    OBJECTIVE IMPAIRMENTS Abnormal gait, decreased activity tolerance, difficulty walking, decreased ROM, decreased strength, and pain.   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   REHAB POTENTIAL: Good  CLINICAL DECISION MAKING: Evolving/moderate complexity pain causing limited mobility   EVALUATION COMPLEXITY: Moderate   GOALS: Goals reviewed with patient? Yes  SHORT TERM GOALS: Target date: 07/08/2022  Patient will increase right knee flexion to 90 degrees  Baseline: Goal status: INITIAL  2.  Patient will ambulate 500' with weight bearing without increased pain  Baseline:  Goal status: INITIAL  3.  Patient will be independent with basic HEP  Baseline:  Goal status: INITIAL  4.  Patient will transition to a cane when safe  Baseline:  Goal status: INITIAL   LONG TERM GOALS: Target date: 08/12/2022   Patient will go up/down a flight of  steps without pain  Baseline:  Goal status: INITIAL  2.  Patient will stand for 1 hour without pain in order to perform ADL's  Baseline:  Goal status: INITIAL  3.  Patient will ambulate 3000' without self reported pain in order to perform ADL's  Baseline:  Goal status: INITIAL     PLAN: PT FREQUENCY: 2x/week  PT DURATION: 8 weeks  PLANNED INTERVENTIONS: Therapeutic exercises, Therapeutic activity, Neuromuscular re-education, Balance training, Gait training, Patient/Family education, Self Care, Joint mobilization, Stair training, DME instructions, Aquatic Therapy, Electrical stimulation, Cryotherapy, Moist heat, Taping, Ultrasound, and Manual therapy  PLAN FOR NEXT SESSION: begin with PROM progress to weight bearing exercises as tolerated. Consider SLR SL clamshell or seated hip abduction. Monitor pain with activity.    Lorayne Bender PT SPT  06/17/2022, 4:02 PM

## 2022-06-19 ENCOUNTER — Encounter: Payer: Self-pay | Admitting: Nurse Practitioner

## 2022-06-25 ENCOUNTER — Ambulatory Visit (HOSPITAL_BASED_OUTPATIENT_CLINIC_OR_DEPARTMENT_OTHER): Payer: Medicaid Other | Admitting: Physical Therapy

## 2022-06-25 ENCOUNTER — Encounter (HOSPITAL_BASED_OUTPATIENT_CLINIC_OR_DEPARTMENT_OTHER): Payer: Self-pay | Admitting: Physical Therapy

## 2022-06-25 DIAGNOSIS — R600 Localized edema: Secondary | ICD-10-CM

## 2022-06-25 DIAGNOSIS — M25661 Stiffness of right knee, not elsewhere classified: Secondary | ICD-10-CM

## 2022-06-25 DIAGNOSIS — M25561 Pain in right knee: Secondary | ICD-10-CM | POA: Diagnosis not present

## 2022-06-25 DIAGNOSIS — G8929 Other chronic pain: Secondary | ICD-10-CM

## 2022-06-25 DIAGNOSIS — R2689 Other abnormalities of gait and mobility: Secondary | ICD-10-CM

## 2022-06-25 NOTE — Therapy (Signed)
OUTPATIENT PHYSICAL THERAPY LOWER EXTREMITY Treatment    Patient Name: Sarah Salas MRN: [id number] DOB:April 18, 1976, 46 y.o., female Today's Date: 06/25/2022   PT End of Session - 06/25/22 0821     Visit Number 5    Number of Visits 16    Date for PT Re-Evaluation 07/17/22    Authorization Type [employer]    PT Start Time 0802    PT Stop Time 0840    PT Time Calculation (min) 38 min    Activity Tolerance Patient tolerated treatment well    Behavior During Therapy [facility] for tasks assessed/performed;Impulsive                 Past Medical History:  Diagnosis Date   ADHD (attention deficit hyperactivity disorder)    Anxiety    Arthritis    Bipolar 1 disorder (HCC)    Brain injury (HCC) 2005   Glaucoma    Hypertension    no longer treated   Past Surgical History:  Procedure Laterality Date   BRAIN SURGERY  10/28/2003   from [place]   KNEE ARTHROSCOPY WITH MENISCAL REPAIR Right 05/19/2022   Procedure: RIGHT KNEE ARTHROSCOPY, MEDIAL MENISCAL DEBRIDEMENT AND ABRASION ARTHROPLASTY;  Surgeon: Huel Cote, MD;  Location: [facility] OR;  Service: Orthopedics;  Laterality: Right;   TIBIA OSTEOTOMY Right 05/19/2022   Procedure: RIGHT KNEE HIGH TIBIAL OSTEOTOMY;  Surgeon: Huel Cote, MD;  Location: [facility] OR;  Service: Orthopedics;  Laterality: Right;   WISDOM TOOTH EXTRACTION     Patient Active Problem List   Diagnosis Date Noted   Closed fracture of right tibial plateau with malunion 05/19/2022   Anxiety 11/08/2017   Bipolar affective disorder (HCC) 11/05/2017    PCP: Loreen Freud MD  REFERRING PROVIDER: Dr Huel Cote   REFERRING DIAG: R knee high tibial osteotomy, meniscal debridement, abrasion arthroplasty  THERAPY DIAG:  Chronic pain of right knee  Stiffness of right knee, not elsewhere classified  Other abnormalities of gait and mobility  Localized edema  Rationale for Evaluation and Treatment Rehabilitation  ONSET DATE: 05/19/2022  SUBJECTIVE:    SUBJECTIVE STATEMENT: Pt states that the R knee is very stiff and painful from the knee down. She states the surgery area is fine. She states that bending and straightening.   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 Yes: NPRS scale: 5/10 Pain location: R knee  Pain description: aching and sharp  Aggravating factors: standing and moving  Relieving factors: rest   PRECAUTIONS: None  WEIGHT BEARING RESTRICTIONS Yes 50% WBAT now progress to WBAT in 2 weeks from 8/24  FALLS:  Has patient fallen in last 6 months? No  LIVING ENVIRONMENT: 2 level house; coming down the steps she has figured out a way to do it that is less painful   OCCUPATION: On [employer] 2nd to traumatic brain injury   PLOF: Independent with pain   PATIENT GOALS  Back to speed walking >1 mile    OBJECTIVE:   GAIT: WBAT with SPC. Pt advised to keep in L hand but prefers to use R. Pt able to perform step through gait at end of session when cued for quad set in standing and toe off   TODAY'S TREATMENT: 9/14  Manual:knee flexion and ext mob grade III to tolerance; able to reach 90 flex and 0 ext Patellar mobs grade III ML and sup inf;   Gastroc stretch with strap 30s 3x  Standing TKE no  band 2x10 Bridge 2x15 GTB at knees Clamshell GTB 2x10   Gait training with SPC; focus on quad set and step through gait   9/6 Manual:Knee PROM to 90 degrees Patella mobs all directions; trigger point release to IT band   Quad sets x20 R LE  SLR with guarding 3x10   SAQ 3x10   Standing forward weight shift x20   Reviewed proper use of the cane with significant improvement in gait     8/31 Manual:Knee PROM to 90 degrees Patella mobs all directions; trigger point release to IT band   Quad sets x20 R LE  SLR with guarding 3x10   Standing hip 3 way x20   Reviewed fitting of the brace and re-fit the brace for home. Reviewed how to do the straps  at home if it slips  Stair training: reviewed going up/down steps while maintaining       8/25  Knee PROM to 90 degrees  Quad sets x20 R LE Patella mobs all directions HS stretch 3x30 seconds  Supine hip ABD no brace 1x15  SLRs with brace on 2x10 close min guard from PT Sidelying hip ABD 2x10 with brace on  Prone hip extensions 2x10 with brace on   Education about safe transfers and brace placement/brace alignment and fit- adjusted brace as appropriate   Eval Exercises - Supine Heel Slide with Strap  - 1 x daily - 7 x weekly - 3 sets - 10 reps (advised just to get it moving. She dosent come back until next Friday we want some type of movement between now and then  - Supine Quad Set  - 1 x daily - 7 x weekly - 3 sets - 10 reps - Seated Ankle Pumps  - 1 x daily - 7 x weekly - 3 sets - 10 reps   PATIENT EDUCATION:  Education details: HEP; progression of weight bearing; swelling and symptom mangement  Person educated: Patient Education method: Explanation, Demonstration, Tactile cues, Verbal cues, and Handouts Education comprehension: verbalized understanding, returned demonstration, verbal cues required, tactile cues required, and needs further education   HOME EXERCISE PROGRAM: Access Code: [email or web address] URL: [email or web address] Date: 05/22/2022 Prepared by: Carolyne Littles  Exercises - Supine Heel Slide with Strap  - 1 x daily - 7 x weekly - 3 sets - 10 reps - Supine Quad Set  - 1 x daily - 7 x weekly - 3 sets - 10 reps - Seated Ankle Pumps  - 1 x daily - 7 x weekly - 3 sets - 10 reps  ASSESSMENT:  CLINICAL IMPRESSION: Pt able to reach 90 deg of flexion and 0 ext with focus on WB TKE following moblizations. Pt able to progress gait and improve step through gait with VC for maintaining quad set and heel toe pattern. Pt with very minimal pain today aside of increased gastroc soleus stiffness which is likely limiting full TKE. Pt advised on self STM technique  and standing/seated calf stretching. Plan to progress WB tolerance, knee ext ROM, and quad/hip strength at next. HEP condensed due to report of excessive time spent with HEP. Pt would benefit from continued skilled therapy in order to reach goals and maximize functional R LE strength and ROM for full return to PLOF.    OBJECTIVE IMPAIRMENTS Abnormal gait, decreased activity tolerance, difficulty walking, decreased ROM, decreased strength, and pain.   ACTIVITY LIMITATIONS carrying, lifting, bending, sitting, standing, squatting, sleeping, stairs, transfers, dressing, and locomotion level  PARTICIPATION LIMITATIONS: meal prep, cleaning, laundry,  driving, shopping, community activity, and yard work  PERSONAL FACTORS 1-2 comorbidities: old TBI bi polar  are also affecting patient's functional outcome.   REHAB POTENTIAL: Good  CLINICAL DECISION MAKING: Evolving/moderate complexity pain causing limited mobility   EVALUATION COMPLEXITY: Moderate   GOALS: Goals reviewed with patient? Yes  SHORT TERM GOALS: Target date: 07/16/2022  Patient will increase right knee flexion to 90 degrees  Baseline: Goal status: INITIAL  2.  Patient will ambulate 500' with weight bearing without increased pain  Baseline:  Goal status: INITIAL  3.  Patient will be independent with basic HEP  Baseline:  Goal status: INITIAL  4.  Patient will transition to a cane when safe  Baseline:  Goal status: INITIAL   LONG TERM GOALS: Target date: 08/20/2022   Patient will go up/down a flight of steps without pain  Baseline:  Goal status: INITIAL  2.  Patient will stand for 1 hour without pain in order to perform ADL's  Baseline:  Goal status: INITIAL  3.  Patient will ambulate 3000' without self reported pain in order to perform ADL's  Baseline:  Goal status: INITIAL     PLAN: PT FREQUENCY: 2x/week  PT DURATION: 8 weeks  PLANNED INTERVENTIONS: Therapeutic exercises, Therapeutic activity,  Neuromuscular re-education, Balance training, Gait training, Patient/Family education, Self Care, Joint mobilization, Stair training, DME instructions, Aquatic Therapy, Electrical stimulation, Cryotherapy, Moist heat, Taping, Ultrasound, and Manual therapy  PLAN FOR NEXT SESSION: begin with PROM progress to weight bearing exercises as tolerated. Consider SLR SL clamshell or seated hip abduction. Monitor pain with activity.   Zebedee Iba PT, DPT 06/29/22 10:58 AM

## 2022-06-29 ENCOUNTER — Ambulatory Visit (HOSPITAL_BASED_OUTPATIENT_CLINIC_OR_DEPARTMENT_OTHER): Payer: Medicaid Other | Admitting: Physical Therapy

## 2022-06-29 ENCOUNTER — Encounter (HOSPITAL_BASED_OUTPATIENT_CLINIC_OR_DEPARTMENT_OTHER): Payer: Self-pay | Admitting: Physical Therapy

## 2022-06-29 DIAGNOSIS — R600 Localized edema: Secondary | ICD-10-CM

## 2022-06-29 DIAGNOSIS — M25661 Stiffness of right knee, not elsewhere classified: Secondary | ICD-10-CM

## 2022-06-29 DIAGNOSIS — G8929 Other chronic pain: Secondary | ICD-10-CM

## 2022-06-29 DIAGNOSIS — M25561 Pain in right knee: Secondary | ICD-10-CM | POA: Diagnosis not present

## 2022-06-29 DIAGNOSIS — R2689 Other abnormalities of gait and mobility: Secondary | ICD-10-CM

## 2022-06-29 NOTE — Therapy (Signed)
OUTPATIENT PHYSICAL THERAPY LOWER EXTREMITY Treatment    Patient Name: Sarah Salas MRN: [id number] DOB:12-17-1975, 46 y.o., female Today's Date: 06/29/2022   PT End of Session - 06/29/22 1057     Visit Number 6   portion of session interrupted by medical emergency within building; another therapist present to supervise and guide treatment while primary was attending to emergency   Number of Visits 16    Date for PT Re-Evaluation 07/17/22    Authorization Type [employer]    PT Start Time 1100    PT Stop Time 1140    PT Time Calculation (min) 40 min    Activity Tolerance Patient tolerated treatment well    Behavior During Therapy [facility] for tasks assessed/performed;Impulsive                 Past Medical History:  Diagnosis Date   ADHD (attention deficit hyperactivity disorder)    Anxiety    Arthritis    Bipolar 1 disorder ([place])    Brain injury ([place]) 2005   Glaucoma    Hypertension    no longer treated   Past Surgical History:  Procedure Laterality Date   BRAIN SURGERY  10/28/2003   from [facility]   KNEE ARTHROSCOPY WITH MENISCAL REPAIR Right 05/19/2022   Procedure: RIGHT KNEE ARTHROSCOPY, MEDIAL MENISCAL DEBRIDEMENT AND ABRASION ARTHROPLASTY;  Surgeon: Vanetta Mulders, MD;  Location: [place];  Service: Orthopedics;  Laterality: Right;   TIBIA OSTEOTOMY Right 05/19/2022   Procedure: RIGHT KNEE HIGH TIBIAL OSTEOTOMY;  Surgeon: Vanetta Mulders, MD;  Location: [place];  Service: Orthopedics;  Laterality: Right;   WISDOM TOOTH EXTRACTION     Patient Active Problem List   Diagnosis Date Noted   Closed fracture of right tibial plateau with malunion 05/19/2022   Anxiety 11/08/2017   Bipolar affective disorder ([place]) 11/05/2017    PCP: Archie Patten MD  REFERRING PROVIDER: Dr Vanetta Mulders   REFERRING DIAG: R knee high tibial osteotomy, meniscal debridement, abrasion arthroplasty  THERAPY DIAG:  Chronic pain of right knee  Other abnormalities of gait and  mobility  Localized edema  Stiffness of right knee, not elsewhere classified  Rationale for Evaluation and Treatment Rehabilitation  ONSET DATE: 05/19/2022  Days since surgery: 41   SUBJECTIVE:   SUBJECTIVE STATEMENT: Pt states she felt good after last session. The back of the R knee is still stiff and tight. She states the calf stretch helped. She is able to walk short distance without the cane.   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 Yes: NPRS scale: 5/10 Pain location: R knee  Pain description: aching and sharp  Aggravating factors: standing and moving  Relieving factors: rest   PRECAUTIONS: None  WEIGHT BEARING RESTRICTIONS Yes 50% WBAT now progress to WBAT in 2 weeks from 8/24  FALLS:  Has patient fallen in last 6 months? No  LIVING ENVIRONMENT: 2 level house; coming down the steps she has figured out a way to do it that is less painful   OCCUPATION: On [employer] 2nd to traumatic brain injury   PLOF: Independent with pain   PATIENT GOALS  Back to speed walking >1 mile    OBJECTIVE:   GAIT: WBAT with SPC. Pt advised to keep in L hand but prefers to use R. Pt able to perform step through gait at end of session when cued for quad set in standing and toe off   TODAY'S TREATMENT:  9/18  Manual:knee flexion and ext mob grade III to tolerance; able to reach 120 flex and 0 ext STM L fibularis group  Seated LAQ 10x no weight, 2x10 3lb  STS from mid thigh table height 2x10 Standing TKE RTB band 2x10 Bridge 2x15 GTB at knees Clamshell GTB 2x10 Standing gastroc stretch 30s 2x Heel toe rocking 10x   TTP of R lateral gastroc, no erythema noted, no swelling noted, no increase in warmth  9/14  Manual:knee flexion and ext mob grade III to tolerance; able to reach 90 flex and 0 ext Patellar mobs grade III ML and sup inf;   Gastroc stretch with strap 30s 3x  Standing TKE no band 2x10 Bridge 2x15  GTB at [employer] GTB 2x10   Gait training with SPC; focus on quad set and step through gait   9/6 Manual:Knee PROM to 90 degrees Patella mobs all directions; trigger point release to IT band   Quad sets x20 R LE  SLR with guarding 3x10   SAQ 3x10   Standing forward weight shift x20   Reviewed proper use of the cane with significant improvement in gait     8/31 Manual:Knee PROM to 90 degrees Patella mobs all directions; trigger point release to IT band   Quad sets x20 R LE  SLR with guarding 3x10   Standing hip 3 way x20   Reviewed fitting of the brace and re-fit the brace for home. Reviewed how to do the straps at home if it slips  Stair training: reviewed going up/down steps while maintaining       8/25  Knee PROM to 90 degrees  Quad sets x20 R LE Patella mobs all directions HS stretch 3x30 seconds  Supine hip ABD no brace 1x15  SLRs with brace on 2x10 close min guard from PT Sidelying hip ABD 2x10 with brace on  Prone hip extensions 2x10 with brace on   Education about safe transfers and brace placement/brace alignment and fit- adjusted brace as appropriate   Eval Exercises - Supine Heel Slide with Strap  - 1 x daily - 7 x weekly - 3 sets - 10 reps (advised just to get it moving. She dosent come back until next Friday we want some type of movement between now and then  - Supine Quad Set  - 1 x daily - 7 x weekly - 3 sets - 10 reps - Seated Ankle Pumps  - 1 x daily - 7 x weekly - 3 sets - 10 reps   PATIENT EDUCATION:  Education details: HEP; progression of weight bearing; swelling and symptom mangement  Person educated: Patient Education method: Explanation, Demonstration, Tactile cues, Verbal cues, and Handouts Education comprehension: verbalized understanding, returned demonstration, verbal cues required, tactile cues required, and needs further education   HOME EXERCISE PROGRAM: Access Code: [email or web address] URL:  [email or web address] Date: 05/22/2022 Prepared by: Carolyne Littles  ASSESSMENT:  CLINICAL IMPRESSION: Pt able to reach 120 deg of flexion and 0 ext following moblizations. Pt able to continue with progression of WB at today's session and continue with loaded exercise today without knee pain. However, pt does have R gastroc/soleus pain and stiffness with gait by end of session. Pt did not exhibit signs of DVT upon exam, appears muscle spasm in nature due to gait and WB compensations. Pt advised on signs of DVT and emergent visit if symptoms arise. Pt gave verbal understanding. Continue with WB progression as tolerated. Revisit heel toe walking at beginning of session at next visit. Pt  would benefit from continued skilled therapy in order to reach goals and maximize functional R LE strength and ROM for full return to PLOF.    OBJECTIVE IMPAIRMENTS Abnormal gait, decreased activity tolerance, difficulty walking, decreased ROM, decreased strength, and pain.   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   REHAB POTENTIAL: Good  CLINICAL DECISION MAKING: Evolving/moderate complexity pain causing limited mobility   EVALUATION COMPLEXITY: Moderate   GOALS: Goals reviewed with patient? Yes  SHORT TERM GOALS: Target date: 07/20/2022  Patient will increase right knee flexion to 90 degrees  Baseline: Goal status: INITIAL  2.  Patient will ambulate 500' with weight bearing without increased pain  Baseline:  Goal status: INITIAL  3.  Patient will be independent with basic HEP  Baseline:  Goal status: INITIAL  4.  Patient will transition to a cane when safe  Baseline:   Goal status: INITIAL   LONG TERM GOALS: Target date: 08/24/2022   Patient will go up/down a flight of steps without pain  Baseline:  Goal status: INITIAL  2.  Patient will stand for 1 hour without pain in order to perform ADL's  Baseline:  Goal status: INITIAL  3.  Patient will ambulate 3000' without self reported pain in order to perform ADL's  Baseline:  Goal status: INITIAL     PLAN: PT FREQUENCY: 2x/week  PT DURATION: 8 weeks  PLANNED INTERVENTIONS: Therapeutic exercises, Therapeutic activity, Neuromuscular re-education, Balance training, Gait training, Patient/Family education, Self Care, Joint mobilization, Stair training, DME instructions, Aquatic Therapy, Electrical stimulation, Cryotherapy, Moist heat, Taping, Ultrasound, and Manual therapy  PLAN FOR NEXT SESSION: begin with PROM progress to weight bearing exercises as tolerated. Consider SLR SL clamshell or seated hip abduction. Monitor pain with activity.   Daleen Bo PT, DPT 06/29/22 12:44 PM

## 2022-07-01 ENCOUNTER — Other Ambulatory Visit (HOSPITAL_BASED_OUTPATIENT_CLINIC_OR_DEPARTMENT_OTHER): Payer: Self-pay

## 2022-07-01 ENCOUNTER — Ambulatory Visit (INDEPENDENT_AMBULATORY_CARE_PROVIDER_SITE_OTHER): Payer: Medicaid Other | Admitting: Orthopaedic Surgery

## 2022-07-01 ENCOUNTER — Ambulatory Visit (INDEPENDENT_AMBULATORY_CARE_PROVIDER_SITE_OTHER): Payer: Medicaid Other

## 2022-07-01 DIAGNOSIS — M21161 Varus deformity, not elsewhere classified, right knee: Secondary | ICD-10-CM | POA: Diagnosis not present

## 2022-07-01 DIAGNOSIS — S82121A Displaced fracture of lateral condyle of right tibia, initial encounter for closed fracture: Secondary | ICD-10-CM | POA: Diagnosis not present

## 2022-07-01 MED ORDER — METHOCARBAMOL 500 MG PO TABS
500.0000 mg | ORAL_TABLET | Freq: Four times a day (QID) | ORAL | 2 refills | Status: DC
  Filled 2022-07-01: qty 30, 8d supply, fill #0
  Filled 2022-07-04 – 2022-07-08 (×2): qty 30, 8d supply, fill #1
  Filled 2022-07-11 – 2022-07-14 (×2): qty 30, 8d supply, fill #2

## 2022-07-01 NOTE — Progress Notes (Signed)
Post Operative Evaluation    Procedure/Date of Surgery: Right knee tibial osteotomy with medial meniscal debridement 05/19/22  Interval History:   Sarah Salas presents today for follow-up of her right knee high tibial osteotomy.  She continues to improve.  She states that she does not have any knee pain at this time.  Her only pain is in the soft tissue of the calf and anterior tibial compartment which is more sore than anything.  She is here today for further assessment.  She has recently graduated to a cane and is even able to walk without any type of assistive devices although does use her cane to help offload the knee.  She states that she feels like she is walking more normally.  She is requesting a muscle relaxer for soreness in the muscle  PMH/PSH/Family History/Social History/Meds/Allergies:    Past Medical History:  Diagnosis Date   ADHD (attention deficit hyperactivity disorder)    Anxiety    Arthritis    Bipolar 1 disorder (HCC)    Brain injury (HCC) 2005   Glaucoma    Hypertension    no longer treated   Past Surgical History:  Procedure Laterality Date   BRAIN SURGERY  10/28/2003   from [facility]   KNEE ARTHROSCOPY WITH MENISCAL REPAIR Right 05/19/2022   Procedure: RIGHT KNEE ARTHROSCOPY, MEDIAL MENISCAL DEBRIDEMENT AND ABRASION ARTHROPLASTY;  Surgeon: Huel Cote, MD;  Location: [facility] OR;  Service: Orthopedics;  Laterality: Right;   TIBIA OSTEOTOMY Right 05/19/2022   Procedure: RIGHT KNEE HIGH TIBIAL OSTEOTOMY;  Surgeon: Huel Cote, MD;  Location: [facility] OR;  Service: Orthopedics;  Laterality: Right;   WISDOM TOOTH EXTRACTION     Social History   Socioeconomic History   Marital status: Single    Spouse name: Not on file   Number of children: 0   Years of education: Not on file   Highest education level: Bachelor's degree (e.g., BA, AB, BS)  Occupational History   Not on file  Tobacco Use   Smoking status: Former    Types: Cigarettes     Quit date: 06/13/2021    Years since quitting: 1.0   Smokeless tobacco: Never  Vaping Use   Vaping Use: Every day  Substance and Sexual Activity   Alcohol use: No   Drug use: No   Sexual activity: Not Currently    Birth control/protection: I.U.D.  Other Topics Concern   Not on file  Social History Narrative   Not on file   Social Determinants of Health   Financial Resource Strain: Medium Risk (01/21/2018)   Overall Financial Resource Strain (CARDIA)    Difficulty of Paying Living Expenses: Somewhat hard  Food Insecurity: No Food Insecurity (01/21/2018)   Hunger Vital Sign    Worried About Running Out of Food in the Last Year: Never true    Ran Out of Food in the Last Year: Never true  Transportation Needs: No Transportation Needs (01/21/2018)   PRAPARE - Administrator, Civil Service (Medical): No    Lack of Transportation (Non-Medical): No  Physical Activity: Inactive (01/21/2018)   Exercise Vital Sign    Days of Exercise per Week: 0 days    Minutes of Exercise per Session: 0 min  Stress: Stress Concern Present (01/21/2018)   [employer] of Occupational Health - Occupational Stress  Questionnaire    Feeling of Stress : To some extent  Social Connections: Moderately Isolated (01/21/2018)   Social Connection and Isolation Panel [NHANES]    Frequency of Communication with Friends and Family: More than three times a week    Frequency of Social Gatherings with Friends and Family: More than three times a week    Attends Religious Services: Never    Marine scientist or Organizations: No    Attends Music therapist: Never    Marital Status: Never married   Family History  Problem Relation Age of Onset   Bipolar disorder Mother    Hypertension Mother    Miscarriages / [place] Mother    Vision loss Mother    Varicose Veins Mother    Alcohol abuse Father    Heart disease Father    Learning disabilities Father    Bipolar disorder Sister     Learning disabilities Sister    Diabetes Paternal Aunt    Bipolar disorder Maternal Grandmother    Hypertension Maternal Grandfather    Cancer Paternal Grandmother    Cancer Paternal Grandfather    No Known Allergies Current Outpatient Medications  Medication Sig Dispense Refill   methocarbamol (ROBAXIN) 500 MG tablet Take 1 tablet (500 mg total) by mouth 4 (four) times daily. 30 tablet 2   acetaminophen (TYLENOL) 500 MG tablet Take 1,000 mg by mouth every 6 (six) hours as needed for mild pain.     ARIPiprazole (ABILIFY) 5 MG tablet Take 1 tablet by mouth once daily (Patient taking differently: Take 5 mg by mouth daily.) 90 tablet 0   aspirin EC 325 MG tablet Take 1 tablet (325 mg total) by mouth daily. 30 tablet 0   atomoxetine (STRATTERA) 40 MG capsule Take 1 capsule (40 mg total) by mouth daily. 90 capsule 0   citalopram (CELEXA) 20 MG tablet Take 1 tablet (20 mg total) by mouth daily. 90 tablet 0   clonazePAM (KLONOPIN) 0.5 MG tablet Take 1 tablet (0.5 mg total) by mouth 3 (three) times daily as needed for anxiety. 90 tablet 2   HYDROcodone-acetaminophen (NORCO/VICODIN) 5-325 MG tablet Take 1 tablet by mouth every 6 (six) hours as needed for moderate pain. 30 tablet 0   lamoTRIgine (LAMICTAL) 200 MG tablet Take 1 tablet (200 mg total) by mouth daily. 90 tablet 0   latanoprost (XALATAN) 0.005 % ophthalmic solution Place 1 drop into both eyes at bedtime.     oxyCODONE (ROXICODONE) 5 MG immediate release tablet Take 1 tablet (5 mg total) by mouth every 4 (four) hours as needed for severe pain or breakthrough pain. 30 tablet 0   traMADol (ULTRAM) 50 MG tablet Take 1 tablet (50 mg total) by mouth every 6 (six) hours as needed. 30 tablet 0   traZODone (DESYREL) 150 MG tablet Take 1 tablet (150 mg total) by mouth at bedtime. 90 tablet 0   No current facility-administered medications for this visit.   No results found.  Review of Systems:   A ROS was performed including pertinent positives  and negatives as documented in the HPI.   Musculoskeletal Exam:    There were no vitals taken for this visit.  Right leg with mild swelling compared to the contralateral side.  This is improved since last visit.  No calf tenderness or palpable cord.  Incisions are well-appearing.  0 degrees to 120 degrees.  There is no medial or lateral joint line tenderness.  Sensation is intact in all distributions.  Fires tibialis anterior as well as EHL and gastrocsoleus.  2+ dorsalis pedis pulse  Imaging:    Limb length views right leg: She now has an approximately neutral axis with plumbline running through the medial tibial spine  X-ray right 4 views: There is a lucency near the osteotomy consistent with a nondisplaced fracture through the lateral tibial plateau.  Overall improved x-rays today show very good alignment status post osteotomy.  I personally reviewed and interpreted the radiographs.   Assessment:   46 year old female 6 weeks status post right knee high tibial osteotomy.  I did discuss today with her that her x-rays do show evidence of a nondisplaced fracture through the lateral tibial plateau that I had not visualized intraoperatively.  Overall I discussed that I do not believe that any additional intervention is required given the fact that this fracture line is nondisplaced, her correction is overall well-maintained, she does not have any knee pain and if anything continues to improve and feel better without any type of assistive walking devices.  I do believe that this will heal well with continued watchful management.  She has been bearing weight on the right leg as tolerated after 2 weeks without any type of interval displacement and overall well-appearing joint surfaces.  At this time I would like her to continue to weight-bear as tolerated and progress with her cane.  She will hopefully be able to wean off of this as tolerated.  I would also like to prescribe her some Robaxin as she is  experiencing some muscle spasms particularly in the gastroc muscle with physical therapy.  I will plan to see her back in 4 weeks for reassessment Plan :    -Return to clinic in 4 weeks for reassessment      I personally saw and evaluated the patient, and participated in the management and treatment plan.  Huel Cote, MD Attending Physician, Orthopedic Surgery  This document was dictated using Dragon voice recognition software. A reasonable attempt at proof reading has been made to minimize errors.

## 2022-07-05 ENCOUNTER — Other Ambulatory Visit (HOSPITAL_BASED_OUTPATIENT_CLINIC_OR_DEPARTMENT_OTHER): Payer: Self-pay

## 2022-07-08 ENCOUNTER — Other Ambulatory Visit (HOSPITAL_BASED_OUTPATIENT_CLINIC_OR_DEPARTMENT_OTHER): Payer: Self-pay

## 2022-07-08 ENCOUNTER — Encounter (HOSPITAL_BASED_OUTPATIENT_CLINIC_OR_DEPARTMENT_OTHER): Payer: Self-pay | Admitting: Physical Therapy

## 2022-07-08 ENCOUNTER — Ambulatory Visit (HOSPITAL_BASED_OUTPATIENT_CLINIC_OR_DEPARTMENT_OTHER): Payer: Medicaid Other | Admitting: Physical Therapy

## 2022-07-08 DIAGNOSIS — M25561 Pain in right knee: Secondary | ICD-10-CM | POA: Diagnosis not present

## 2022-07-08 DIAGNOSIS — G8929 Other chronic pain: Secondary | ICD-10-CM

## 2022-07-08 DIAGNOSIS — M25661 Stiffness of right knee, not elsewhere classified: Secondary | ICD-10-CM

## 2022-07-08 DIAGNOSIS — R2689 Other abnormalities of gait and mobility: Secondary | ICD-10-CM

## 2022-07-08 DIAGNOSIS — R600 Localized edema: Secondary | ICD-10-CM

## 2022-07-08 NOTE — Therapy (Signed)
OUTPATIENT PHYSICAL THERAPY LOWER EXTREMITY Treatment    Patient Name: Sarah Salas MRN: [id number] DOB:1976/02/10, 46 y.o., female Today's Date: 07/08/2022   PT End of Session - 07/08/22 0803     Visit Number 7    Number of Visits 16    Date for PT Re-Evaluation 07/17/22    Authorization Type [employer]    PT Start Time 0800    PT Stop Time 0842    PT Time Calculation (min) 42 min    Activity Tolerance Patient tolerated treatment well    Behavior During Therapy [facility] for tasks assessed/performed;Impulsive                 Past Medical History:  Diagnosis Date   ADHD (attention deficit hyperactivity disorder)    Anxiety    Arthritis    Bipolar 1 disorder ([place])    Brain injury ([place]) 2005   Glaucoma    Hypertension    no longer treated   Past Surgical History:  Procedure Laterality Date   BRAIN SURGERY  10/28/2003   from [facility]   KNEE ARTHROSCOPY WITH MENISCAL REPAIR Right 05/19/2022   Procedure: RIGHT KNEE ARTHROSCOPY, MEDIAL MENISCAL DEBRIDEMENT AND ABRASION ARTHROPLASTY;  Surgeon: Vanetta Mulders, MD;  Location: [place];  Service: Orthopedics;  Laterality: Right;   TIBIA OSTEOTOMY Right 05/19/2022   Procedure: RIGHT KNEE HIGH TIBIAL OSTEOTOMY;  Surgeon: Vanetta Mulders, MD;  Location: [place];  Service: Orthopedics;  Laterality: Right;   WISDOM TOOTH EXTRACTION     Patient Active Problem List   Diagnosis Date Noted   Closed fracture of right tibial plateau with malunion 05/19/2022   Anxiety 11/08/2017   Bipolar affective disorder ([place]) 11/05/2017    PCP: Archie Patten MD  REFERRING PROVIDER: Dr Vanetta Mulders   REFERRING DIAG: R knee high tibial osteotomy, meniscal debridement, abrasion arthroplasty  THERAPY DIAG:  Chronic pain of right knee  Other abnormalities of gait and mobility  Localized edema  Stiffness of right knee, not elsewhere classified  Rationale for Evaluation and Treatment Rehabilitation  ONSET DATE: 05/19/2022  Days since surgery:  50   SUBJECTIVE:   SUBJECTIVE STATEMENT: The patient has started walking without a cane per MD. She reports it has been a little sore but overall she is doing well.   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 Yes: NPRS scale: 3/10 Pain location: R knee  Pain description: aching and sharp  Aggravating factors: standing and moving  Relieving factors: rest   9/27  PRECAUTIONS: None  WEIGHT BEARING RESTRICTIONS Yes 50% WBAT now progress to WBAT in 2 weeks from 8/24  FALLS:  Has patient fallen in last 6 months? No  LIVING ENVIRONMENT: 2 level house; coming down the steps she has figured out a way to do it that is less painful   OCCUPATION: On SSI 2nd to traumatic brain injury   PLOF: Independent with pain   PATIENT GOALS  Back to speed walking >1 mile    OBJECTIVE:   GAIT: WBAT with SPC. Pt advised to keep in L hand but prefers to use R. Pt able to perform step through gait at end of session when cued for quad set in standing and toe off   TODAY'S TREATMENT: 09/27 Manual:knee flexion and ext mob grade III to tolerance; able to reach 120 flex and 0 ext Patella mobilization   Seated LAQ 10x no weight, 2x10 3lb   Bridge 2x15 GTB at  knees Clamshell GTB 2x10  Standing low march with the left with mod cuing for technique   Heel raise x20 with cuing 2x10   9/18  Manual:knee flexion and ext mob grade III to tolerance; able to reach 120 flex and 0 ext STM L fibularis group  Seated LAQ 10x no weight, 2x10 3lb  STS from mid thigh table height 2x10 Standing TKE RTB band 2x10 Bridge 2x15 GTB at knees Clamshell GTB 2x10 Standing gastroc stretch 30s 2x Heel toe rocking 10x   Step up 2 inch 2x10  Lateral step up 2x10   9/14  Manual:knee flexion and ext mob grade III to tolerance; able to reach 90 flex and 0 ext Patellar mobs grade III ML and sup inf;   Gastroc stretch with strap 30s 3x  Standing  TKE no band 2x10 Bridge 2x15 GTB at [employer] GTB 2x10   Gait training with SPC; focus on quad set and step through gait    PATIENT EDUCATION:  Education details: HEP; progression of weight bearing; swelling and symptom mangement  Person educated: Patient Education method: Explanation, Demonstration, Tactile cues, Verbal cues, and Handouts Education comprehension: verbalized understanding, returned demonstration, verbal cues required, tactile cues required, and needs further education   HOME EXERCISE PROGRAM: Access Code: [email or web address] URL: [email or web address] Date: 05/22/2022 Prepared by: Carolyne Littles  ASSESSMENT:  CLINICAL IMPRESSION: Therapy initiated stair training today. She tolerated well. She may have been able to do a 4 inch step, but she also started a low march which caused her some lateral pain down her leg. The low march caused her some lateral leg pain. She was advised to continue to pwokr at home but listen to her knee. Therapy will continue to progress as tolerated.   OBJECTIVE IMPAIRMENTS Abnormal gait, decreased activity tolerance, difficulty walking, decreased ROM, decreased strength, and pain.   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   REHAB POTENTIAL: Good  CLINICAL DECISION MAKING: Evolving/moderate complexity pain causing limited mobility   EVALUATION COMPLEXITY: Moderate   GOALS: Goals reviewed with patient? Yes  SHORT TERM GOALS: Target date: 07/29/2022  Patient will increase right knee flexion to 90 degrees  Baseline: Goal status: INITIAL  2.  Patient will ambulate 500' with weight bearing without increased pain   Baseline:  Goal status: INITIAL  3.  Patient will be independent with basic HEP  Baseline:  Goal status: INITIAL  4.  Patient will transition to a cane when safe  Baseline:  Goal status: INITIAL   LONG TERM GOALS: Target date: 09/02/2022   Patient will go up/down a flight of steps without pain  Baseline:  Goal status: INITIAL  2.  Patient will stand for 1 hour without pain in order to perform ADL's  Baseline:  Goal status: INITIAL  3.  Patient will ambulate 3000' without self reported pain in order to perform ADL's  Baseline:  Goal status: INITIAL     PLAN: PT FREQUENCY: 2x/week  PT DURATION: 8 weeks  PLANNED INTERVENTIONS: Therapeutic exercises, Therapeutic activity, Neuromuscular re-education, Balance training, Gait training, Patient/Family education, Self Care, Joint mobilization, Stair training, DME instructions, Aquatic Therapy, Electrical stimulation, Cryotherapy, Moist heat, Taping, Ultrasound, and Manual therapy  PLAN FOR NEXT SESSION: begin with PROM progress to weight bearing exercises as tolerated. Consider SLR  SL clamshell or seated hip abduction. Monitor pain with activity.   Carolyne Littles PT DPT  07/08/22 8:05 AM

## 2022-07-12 ENCOUNTER — Encounter (HOSPITAL_BASED_OUTPATIENT_CLINIC_OR_DEPARTMENT_OTHER): Payer: Self-pay | Admitting: Pharmacist

## 2022-07-12 ENCOUNTER — Other Ambulatory Visit (HOSPITAL_BASED_OUTPATIENT_CLINIC_OR_DEPARTMENT_OTHER): Payer: Self-pay

## 2022-07-14 ENCOUNTER — Other Ambulatory Visit (HOSPITAL_BASED_OUTPATIENT_CLINIC_OR_DEPARTMENT_OTHER): Payer: Self-pay

## 2022-07-15 ENCOUNTER — Encounter (HOSPITAL_BASED_OUTPATIENT_CLINIC_OR_DEPARTMENT_OTHER): Payer: Self-pay | Admitting: Physical Therapy

## 2022-07-15 ENCOUNTER — Ambulatory Visit (HOSPITAL_BASED_OUTPATIENT_CLINIC_OR_DEPARTMENT_OTHER): Payer: Medicaid Other | Attending: Orthopaedic Surgery | Admitting: Physical Therapy

## 2022-07-15 DIAGNOSIS — M25561 Pain in right knee: Secondary | ICD-10-CM | POA: Diagnosis not present

## 2022-07-15 DIAGNOSIS — G8929 Other chronic pain: Secondary | ICD-10-CM

## 2022-07-15 DIAGNOSIS — R2689 Other abnormalities of gait and mobility: Secondary | ICD-10-CM

## 2022-07-15 DIAGNOSIS — R600 Localized edema: Secondary | ICD-10-CM | POA: Diagnosis present

## 2022-07-15 DIAGNOSIS — M25661 Stiffness of right knee, not elsewhere classified: Secondary | ICD-10-CM | POA: Diagnosis present

## 2022-07-15 NOTE — Therapy (Signed)
OUTPATIENT PHYSICAL THERAPY LOWER EXTREMITY Treatment    Patient Name: Sarah Salas MRN: [id number] DOB:[birth year], 46 y.o., female Today's Date: 07/15/2022   PT End of Session - 07/15/22 0835     Visit Number 8    Number of Visits 16    Date for PT Re-Evaluation 07/17/22    Authorization Type [employer]    PT Start Time 0802    PT Stop Time 0840    PT Time Calculation (min) 38 min    Activity Tolerance Patient tolerated treatment well    Behavior During Therapy [facility] for tasks assessed/performed;Impulsive                  Past Medical History:  Diagnosis Date   ADHD (attention deficit hyperactivity disorder)    Anxiety    Arthritis    Bipolar 1 disorder ([place])    Brain injury ([place]) 2005   Glaucoma    Hypertension    no longer treated   Past Surgical History:  Procedure Laterality Date   BRAIN SURGERY  10/28/2003   from [facility]   KNEE ARTHROSCOPY WITH MENISCAL REPAIR Right 05/19/2022   Procedure: RIGHT KNEE ARTHROSCOPY, MEDIAL MENISCAL DEBRIDEMENT AND ABRASION ARTHROPLASTY;  Surgeon: Vanetta Mulders, MD;  Location: [place];  Service: Orthopedics;  Laterality: Right;   TIBIA OSTEOTOMY Right 05/19/2022   Procedure: RIGHT KNEE HIGH TIBIAL OSTEOTOMY;  Surgeon: Vanetta Mulders, MD;  Location: [place];  Service: Orthopedics;  Laterality: Right;   WISDOM TOOTH EXTRACTION     Patient Active Problem List   Diagnosis Date Noted   Closed fracture of right tibial plateau with malunion 05/19/2022   Anxiety 11/08/2017   Bipolar affective disorder ([place]) 11/05/2017    PCP: Archie Patten MD  REFERRING PROVIDER: Dr Vanetta Mulders   REFERRING DIAG: R knee high tibial osteotomy, meniscal debridement, abrasion arthroplasty  THERAPY DIAG:  Chronic pain of right knee  Other abnormalities of gait and mobility  Localized edema  Stiffness of right knee, not elsewhere classified  Rationale for Evaluation and Treatment Rehabilitation  ONSET DATE: 05/19/2022  Days since surgery:  57   SUBJECTIVE:   SUBJECTIVE STATEMENT: The patien thas been walking without a device. She reports a the end of the day it gets sore. She was sore after the last visit but she went and did a lot of walking.  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 Yes: NPRS scale: 3/10 Pain location: R knee  Pain description: aching and sharp  Aggravating factors: standing and moving  Relieving factors: rest   10/4 PRECAUTIONS: None  WEIGHT BEARING RESTRICTIONS Yes 50% WBAT now progress to WBAT in 2 weeks from 8/24  FALLS:  Has patient fallen in last 6 months? No  LIVING ENVIRONMENT: 2 level house; coming down the steps she has figured out a way to do it that is less painful   OCCUPATION: On SSI 2nd to traumatic brain injury   PLOF: Independent with pain   PATIENT GOALS  Back to speed walking >1 mile    OBJECTIVE:   GAIT: WBAT with SPC. Pt advised to keep in L hand but prefers to use R. Pt able to perform step through gait at end of session when cued for quad set in standing and toe off   TODAY'S TREATMENT: 10/4 Manual:knee flexion and ext mob grade III to tolerance; able to reach 120 flex and 0 ext Patella mobilization   Quad set x20  SAQ 3x10 1.5 lbs   SLR 3x10  LAQ 3x10 1.5 lbs   Heel raise x20 with cuing 2x10 Slow march with leftfro right weight bearing 2x10    Step up 2 inch 2x10  Lateral step up 2x10   09/27 Manual:knee flexion and ext mob grade III to tolerance; able to reach 120 flex and 0 ext Patella mobilization   Seated LAQ 10x no weight, 2x10 3lb   Bridge 2x15 GTB at knees Clamshell GTB 2x10  Standing low march with the left with mod cuing for technique   Heel raise x20 with cuing 2x10   9/18  Manual:knee flexion and ext mob grade III to tolerance; able to reach 120 flex and 0 ext STM L fibularis group  Seated LAQ 10x no weight, 2x10 3lb  STS from mid thigh table height  2x10 Standing TKE RTB band 2x10 Bridge 2x15 GTB at knees Clamshell GTB 2x10 Standing gastroc stretch 30s 2x Heel toe rocking 10x   Step up 2 inch 2x10  Lateral step up 2x10   9/14  Manual:knee flexion and ext mob grade III to tolerance; able to reach 90 flex and 0 ext Patellar mobs grade III ML and sup inf;   Gastroc stretch with strap 30s 3x  Standing TKE no band 2x10 Bridge 2x15 GTB at [employer] GTB 2x10   Gait training with SPC; focus on quad set and step through gait    PATIENT EDUCATION:  Education details: HEP; progression of weight bearing; swelling and symptom mangement  Person educated: Patient Education method: Explanation, Demonstration, Tactile cues, Verbal cues, and Handouts Education comprehension: verbalized understanding, returned demonstration, verbal cues required, tactile cues required, and needs further education   HOME EXERCISE PROGRAM: Access Code: [email or web address] URL: [email or web address] Date: 05/22/2022 Prepared by: Carolyne Littles  ASSESSMENT:  CLINICAL IMPRESSION: The patient continues to do well. She has an antalgic gait but it is improving. She had no significant pain with treatment. She has mild crepitus in her knee with focused quad strengthening. We worked on patella mobility which helped. We talked about how to do her HEP. We advanced her stair height today.   OBJECTIVE IMPAIRMENTS Abnormal gait, decreased activity tolerance, difficulty walking, decreased ROM, decreased strength, and pain.  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   REHAB POTENTIAL: Good  CLINICAL DECISION MAKING: Evolving/moderate complexity  pain causing limited mobility   EVALUATION COMPLEXITY: Moderate   GOALS: Goals reviewed with patient? Yes  SHORT TERM GOALS: Target date: 08/05/2022  Patient will increase right knee flexion to 90 degrees  Baseline: Goal status: INITIAL  2.  Patient will ambulate 500' with weight bearing without increased pain  Baseline:  Goal status: INITIAL  3.  Patient will be independent with basic HEP  Baseline:  Goal status: INITIAL  4.  Patient will transition to a cane when safe  Baseline:  Goal status: INITIAL   LONG TERM GOALS: Target date: 09/09/2022   Patient will go up/down a flight of steps without pain  Baseline:  Goal status: INITIAL  2.  Patient will stand for 1 hour without pain in order to perform ADL's  Baseline:  Goal status: INITIAL  3.  Patient will ambulate 3000' without self reported pain in order to perform ADL's  Baseline:  Goal  status: INITIAL     PLAN: PT FREQUENCY: 2x/week  PT DURATION: 8 weeks  PLANNED INTERVENTIONS: Therapeutic exercises, Therapeutic activity, Neuromuscular re-education, Balance training, Gait training, Patient/Family education, Self Care, Joint mobilization, Stair training, DME instructions, Aquatic Therapy, Electrical stimulation, Cryotherapy, Moist heat, Taping, Ultrasound, and Manual therapy  PLAN FOR NEXT SESSION: begin with PROM progress to weight bearing exercises as tolerated. Consider SLR SL clamshell or seated hip abduction. Monitor pain with activity.   Carolyne Littles PT DPT  07/15/22 8:37 AM

## 2022-07-22 ENCOUNTER — Telehealth (HOSPITAL_BASED_OUTPATIENT_CLINIC_OR_DEPARTMENT_OTHER): Payer: Medicaid Other | Admitting: Psychiatry

## 2022-07-22 ENCOUNTER — Encounter (HOSPITAL_COMMUNITY): Payer: Self-pay | Admitting: Psychiatry

## 2022-07-22 DIAGNOSIS — F902 Attention-deficit hyperactivity disorder, combined type: Secondary | ICD-10-CM

## 2022-07-22 DIAGNOSIS — F419 Anxiety disorder, unspecified: Secondary | ICD-10-CM | POA: Diagnosis not present

## 2022-07-22 DIAGNOSIS — F319 Bipolar disorder, unspecified: Secondary | ICD-10-CM | POA: Diagnosis not present

## 2022-07-22 MED ORDER — CLONAZEPAM 0.5 MG PO TABS: 0.5000 mg | ORAL_TABLET | Freq: Three times a day (TID) | ORAL | 2 refills | Status: DC | PRN

## 2022-07-22 MED ORDER — CITALOPRAM HYDROBROMIDE 20 MG PO TABS
20.0000 mg | ORAL_TABLET | Freq: Every day | ORAL | 0 refills | Status: DC
Start: 2022-07-22 — End: 2022-10-21

## 2022-07-22 MED ORDER — ARIPIPRAZOLE 5 MG PO TABS: ORAL_TABLET | ORAL | 0 refills | Status: DC

## 2022-07-22 MED ORDER — ATOMOXETINE HCL 40 MG PO CAPS
40.0000 mg | ORAL_CAPSULE | Freq: Every day | ORAL | 0 refills | Status: DC
Start: 2022-07-22 — End: 2022-08-13

## 2022-07-22 MED ORDER — LAMOTRIGINE 200 MG PO TABS: 200.0000 mg | ORAL_TABLET | Freq: Every day | ORAL | 0 refills | Status: DC

## 2022-07-22 MED ORDER — TRAZODONE HCL 150 MG PO TABS: 150.0000 mg | ORAL_TABLET | Freq: Every day | ORAL | 0 refills | Status: DC

## 2022-07-22 NOTE — Progress Notes (Signed)
Virtual Visit via Telephone Note  I connected with Sarah Salas on 07/22/22 at  8:40 AM EDT by telephone and verified that I am speaking with the correct person using two identifiers.  Location: Patient: Home Provider: Home Office   I discussed the limitations, risks, security and privacy concerns of performing an evaluation and management service by telephone and the availability of in person appointments. I also discussed with the patient that there may be a patient responsible charge related to this service. The patient expressed understanding and agreed to proceed.   History of Present Illness: Patient is evaluated by phone session.  She recently had a knee surgery and she is slowly recovering.  She is pleased that her pain level is subsided and she does not have to use the walker.  She also able to help her mother who is also doing better and able to walk a little bit in the house.  She is taking care of her mother and stepfather who also has health issues.  She is pleased that her sister did call her and check on her.  She was taking narcotic pain medication after the surgery but now all her medicines are discontinued except the muscle relaxants.  She denies any mood swing, mania, anger, irritability.  She still struggle with focus attention but able to manage.  She is taking the Strattera.  She denies any hallucination, paranoia or any suicidal thoughts.  She admitted weight gain but trying to lose weight since she has knee surgery and able to walk.  She denies any panic attack or any crying spells.  She denies any suicidal thoughts.  Her relationship with the boyfriend is going okay.   Past Psychiatric History:  H/O ADHD, bipolar disorder.  Vyvanse did not help.  Adderall helped. Latuda help in the beginning but then got worse with increased dose.  Vistaril did not help.  No h/o paranoia, inpatient treatment or suicidal attempt. H/O TBI  Psychiatric Specialty Exam: Physical Exam  Review  of Systems  Weight 262 lb (118.8 kg).There is no height or weight on file to calculate BMI.  General Appearance: NA  Eye Contact:  NA  Speech:  Clear and Coherent and fast  Volume:  Increased  Mood:  Anxious  Affect:  NA  Thought Process:  Descriptions of Associations: Circumstantial  Orientation:  Full (Time, Place, and Person)  Thought Content:  Rumination  Suicidal Thoughts:  No  Homicidal Thoughts:  No  Memory:  Immediate;   Fair Recent;   Fair Remote;   Fair  Judgement:  Fair  Insight:  Shallow  Psychomotor Activity:  NA  Concentration:  Concentration: Fair and Attention Span: Fair  Recall:  [employer] of Knowledge:  Fair  Language:  Good  Akathisia:  No  Handed:  Right  AIMS (if indicated):     Assets:  Communication Skills Desire for Improvement Housing Transportation  ADL's:  Intact  Cognition:  WNL  Sleep:   5-6 hrs      Assessment and Plan: Bipolar disorder type I.  Anxiety.  ADHD, combined type.  I reviewed blood work results.  Her sugar is mildly elevated but unsure if it is fasting.  She gained weight.  We discussed polypharmacy.  Recommended to consider cutting down the Abilify half tablet for 2 weeks and then discontinue to help the weight loss.  Patient is on Lamictal however if she noticed that her symptoms worsening then she can go back to Abilify 5 mg daily.  Patient reluctant to cut down other medications since she is doing well and she has more energy and walking and active.  She is handling her symptoms better.  She is sleeping better.  We will continue Klonopin 0.5 mg up to 3 times a day, Celexa 20 mg daily, Lamictal 200 mg daily, trazodone 150 mg at bedtime and Strattera 40 mg daily.  Recommend to call us back if she has any question, concern or if she feels worsening of the symptoms.  Follow-up in 3 months.  Follow Up Instructions:    I discussed the assessment and treatment plan with the patient. The patient was provided an opportunity to ask  questions and all were answered. The patient agreed with the plan and demonstrated an understanding of the instructions.   The patient was advised to call back or seek an in-person evaluation if the symptoms worsen or if the condition fails to improve as anticipated.  Collaboration of Care: Other provider involved in patient's care AEB notes are available in epic to review.  Patient/Guardian was advised Release of Information must be obtained prior to any record release in order to collaborate their care with an outside provider. Patient/Guardian was advised if they have not already done so to contact the registration department to sign all necessary forms in order for [place] to release information regarding their care.   Consent: Patient/Guardian gives verbal consent for treatment and assignment of benefits for services provided during this visit. Patient/Guardian expressed understanding and agreed to proceed.    I provided 23 minutes of non-face-to-face time during this encounter.   Cleotis Nipper, MD

## 2022-07-23 ENCOUNTER — Encounter (HOSPITAL_BASED_OUTPATIENT_CLINIC_OR_DEPARTMENT_OTHER): Payer: Self-pay | Admitting: Physical Therapy

## 2022-07-23 ENCOUNTER — Ambulatory Visit (HOSPITAL_BASED_OUTPATIENT_CLINIC_OR_DEPARTMENT_OTHER): Payer: Medicaid Other | Admitting: Physical Therapy

## 2022-07-23 DIAGNOSIS — M25661 Stiffness of right knee, not elsewhere classified: Secondary | ICD-10-CM

## 2022-07-23 DIAGNOSIS — R2689 Other abnormalities of gait and mobility: Secondary | ICD-10-CM

## 2022-07-23 DIAGNOSIS — G8929 Other chronic pain: Secondary | ICD-10-CM

## 2022-07-23 DIAGNOSIS — R600 Localized edema: Secondary | ICD-10-CM

## 2022-07-23 DIAGNOSIS — M25561 Pain in right knee: Secondary | ICD-10-CM | POA: Diagnosis not present

## 2022-07-23 NOTE — Therapy (Signed)
OUTPATIENT PHYSICAL THERAPY LOWER EXTREMITY Treatment    Patient Name: Sarah Salas MRN: [id number] DOB:1976-10-09, 46 y.o., female Today's Date: 07/23/2022   PT End of Session - 07/23/22 0803     Visit Number 9    Number of Visits 16    Date for PT Re-Evaluation 07/17/22    Authorization Type [employer]    PT Start Time 0800    PT Stop Time 0843    PT Time Calculation (min) 43 min    Activity Tolerance Patient tolerated treatment well    Behavior During Therapy [facility] for tasks assessed/performed;Impulsive                  Past Medical History:  Diagnosis Date   ADHD (attention deficit hyperactivity disorder)    Anxiety    Arthritis    Bipolar 1 disorder ([place])    Brain injury ([place]) 2005   Glaucoma    Hypertension    no longer treated   Past Surgical History:  Procedure Laterality Date   BRAIN SURGERY  10/28/2003   from [facility]   KNEE ARTHROSCOPY WITH MENISCAL REPAIR Right 05/19/2022   Procedure: RIGHT KNEE ARTHROSCOPY, MEDIAL MENISCAL DEBRIDEMENT AND ABRASION ARTHROPLASTY;  Surgeon: Vanetta Mulders, MD;  Location: [place];  Service: Orthopedics;  Laterality: Right;   TIBIA OSTEOTOMY Right 05/19/2022   Procedure: RIGHT KNEE HIGH TIBIAL OSTEOTOMY;  Surgeon: Vanetta Mulders, MD;  Location: [place];  Service: Orthopedics;  Laterality: Right;   WISDOM TOOTH EXTRACTION     Patient Active Problem List   Diagnosis Date Noted   Closed fracture of right tibial plateau with malunion 05/19/2022   Anxiety 11/08/2017   Bipolar affective disorder ([place]) 11/05/2017    PCP: Archie Patten MD  REFERRING PROVIDER: Dr Vanetta Mulders   REFERRING DIAG: R knee high tibial osteotomy, meniscal debridement, abrasion arthroplasty  THERAPY DIAG:  Chronic pain of right knee  Other abnormalities of gait and mobility  Localized edema  Stiffness of right knee, not elsewhere classified  Rationale for Evaluation and Treatment Rehabilitation  ONSET DATE: 05/19/2022  Days since surgery:  65   SUBJECTIVE:   SUBJECTIVE STATEMENT: The patien thas been walking without a device. She reports a the end of the day it gets sore. She was sore after the last visit but she went and did a lot of walking.  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 Yes: NPRS scale: 3/10 Pain location: R knee  Pain description: aching and sharp  Aggravating factors: standing and moving  Relieving factors: rest   10/4 PRECAUTIONS: None  WEIGHT BEARING RESTRICTIONS Yes 50% WBAT now progress to WBAT in 2 weeks from 8/24  FALLS:  Has patient fallen in last 6 months? No  LIVING ENVIRONMENT: 2 level house; coming down the steps she has figured out a way to do it that is less painful   OCCUPATION: On [employer]SI 2nd to traumatic brain injury   PLOF: Independent with pain   PATIENT GOALS  Back to speed walking >1 mile    OBJECTIVE:   GAIT: WBAT with SPC. Pt advised to keep in L hand but prefers to use R. Pt able to perform step through gait at end of session when cued for quad set in standing and toe off    LOWER EXTREMITY ROM:   Passive ROM Right eval Left eval   Hip flexion       Hip extension  Hip abduction       Hip adduction       Hip internal rotation       Hip external rotation       Knee flexion 38   120  Knee extension 0   -3  Ankle dorsiflexion       Ankle plantarflexion       Ankle inversion       Ankle eversion        (Blank rows = not tested)   LOWER EXTREMITY MMT:   MMT Right eval Left eval  Hip flexion  38.1 40   Hip extension      Hip abduction  66.2 51  Hip adduction      Hip internal rotation      Hip external rotation      Knee flexion      Knee extension  26.4  34.4  Ankle dorsiflexion      Ankle plantarflexion      Ankle inversion      Ankle eversion       (Blank rows = not tested)not tested 2nd to recent surgery      TODAY'S TREATMENT: 10/12 Manual:knee flexion and ext mob  grade III to tolerance; able to reach 120 flex and 0 ext Patella mobilization; STM to posterior knee; PA and AP glides to improve flexion and extension   SAQ 3x10 1.5 lbs   SLR 3x10  LAQ 3x10 1.5 lbs   Heel raise x20 with cuing 2x10 Slow march with leftfro right weight bearing 2x10    Step up 4 inch 3x10  Lateral step up 2x10  10/4 Manual:knee flexion and ext mob grade III to tolerance; able to reach 120 flex and 0 ext Patella mobilization   Quad set x20  SAQ 3x10 1.5 lbs   SLR 3x10  LAQ 3x10 1.5 lbs   Heel raise x20 with cuing 2x10 Slow march with leftfro right weight bearing 2x10    Step up 2 inch 2x10  Lateral step up 2x10      PATIENT EDUCATION:  Education details: HEP; progression of weight bearing; swelling and symptom mangement  Person educated: Patient Education method: Explanation, Demonstration, Tactile cues, Verbal cues, and Handouts Education comprehension: verbalized understanding, returned demonstration, verbal cues required, tactile cues required, and needs further education   HOME EXERCISE PROGRAM: Access Code: [email or web address] URL: [email or web address] Date: 05/22/2022 Prepared by: Carolyne Littles  ASSESSMENT:  CLINICAL IMPRESSION: The patient continues to do well. She has an antalgic gait but it is improving. She had no significant pain with treatment. She has mild crepitus in her knee with focused quad strengthening. We worked on patella mobility which helped. We talked about how to do her HEP. We advanced her stair height today.   OBJECTIVE IMPAIRMENTS Abnormal gait, decreased activity tolerance, difficulty walking, decreased ROM, decreased strength, and pain.  ACTIVITY LIMITATIONS carrying, lifting, bending, sitting, standing, squatting, sleeping, stairs, transfers, dressing, and locomotion level  PARTICIPATION LIMITATIONS: meal prep, cleaning, laundry, driving, shopping, community activity, and yard work  PERSONAL FACTORS 1-2  comorbidities: old TBI bi polar  are also affecting patient's functional outcome.   REHAB POTENTIAL: Good  CLINICAL DECISION MAKING: Evolving/moderate complexity pain causing limited mobility   EVALUATION COMPLEXITY: Moderate   GOALS: Goals reviewed with patient? Yes  SHORT TERM GOALS: Target date: 08/13/2022  Patient will increase right knee flexion to 90 degrees  Baseline: Goal status: achieved   2.  Patient will ambulate 500' with weight bearing without increased  pain  Baseline:  Goal status: ambulating community distances with minor pain   3.  Patient will be independent with basic HEP  Baseline: Has basic HEP   4.  Patient will transition to a cane when safe  Baseline:  Goal status: Not using a device achieve   LONG TERM GOALS: Target date: 09/17/2022   Patient will go up/down a flight of steps without pain  Baseline:  Goal status: working on steps; up to a 4 inch   2.  Patient will stand for 1 hour without pain in order to perform ADL's  Baseline:  Goal status:standing for about 1/2 hour   3.  Patient will ambulate 3000' without self reported pain in order to perform ADL's  Baseline:  Goal status: ambulating community distances but still having pain      PLAN: PT FREQUENCY: 2x/week  PT DURATION: 8 weeks  PLANNED INTERVENTIONS: Therapeutic exercises, Therapeutic activity, Neuromuscular re-education, Balance training, Gait training, Patient/Family education, Self Care, Joint mobilization, Stair training, DME instructions, Aquatic Therapy, Electrical stimulation, Cryotherapy, Moist heat, Taping, Ultrasound, and Manual therapy  PLAN FOR NEXT SESSION: continue to progress weight bearing activity   Carolyne Littles PT DPT  07/23/22 8:04 AM

## 2022-07-27 ENCOUNTER — Encounter (HOSPITAL_BASED_OUTPATIENT_CLINIC_OR_DEPARTMENT_OTHER): Payer: Self-pay | Admitting: Orthopaedic Surgery

## 2022-07-29 ENCOUNTER — Ambulatory Visit (INDEPENDENT_AMBULATORY_CARE_PROVIDER_SITE_OTHER): Payer: Medicaid Other

## 2022-07-29 ENCOUNTER — Telehealth (HOSPITAL_COMMUNITY): Payer: Self-pay | Admitting: *Deleted

## 2022-07-29 ENCOUNTER — Ambulatory Visit (INDEPENDENT_AMBULATORY_CARE_PROVIDER_SITE_OTHER): Payer: Medicaid Other | Admitting: Orthopaedic Surgery

## 2022-07-29 DIAGNOSIS — Z01818 Encounter for other preprocedural examination: Secondary | ICD-10-CM | POA: Diagnosis not present

## 2022-07-29 DIAGNOSIS — M21161 Varus deformity, not elsewhere classified, right knee: Secondary | ICD-10-CM | POA: Diagnosis not present

## 2022-07-29 NOTE — Progress Notes (Signed)
Post Operative Evaluation    Procedure/Date of Surgery: Right knee tibial osteotomy with medial meniscal debridement 05/19/22  Interval History:   Presents today for follow-up of her right knee.  Overall she is doing extremely well.  She no longer has any pain in the knee.  She is walking much straighter.  She is overall denying any knee pain.  She is very happy with her outcome at this point.  PMH/PSH/Family History/Social History/Meds/Allergies:    Past Medical History:  Diagnosis Date   ADHD (attention deficit hyperactivity disorder)    Anxiety    Arthritis    Bipolar 1 disorder ([place])    Brain injury ([place]) 2005   Glaucoma    Hypertension    no longer treated   Past Surgical History:  Procedure Laterality Date   BRAIN SURGERY  10/28/2003   from [facility]   KNEE ARTHROSCOPY WITH MENISCAL REPAIR Right 05/19/2022   Procedure: RIGHT KNEE ARTHROSCOPY, MEDIAL MENISCAL DEBRIDEMENT AND ABRASION ARTHROPLASTY;  Surgeon: Vanetta Mulders, MD;  Location: [place];  Service: Orthopedics;  Laterality: Right;   TIBIA OSTEOTOMY Right 05/19/2022   Procedure: RIGHT KNEE HIGH TIBIAL OSTEOTOMY;  Surgeon: Vanetta Mulders, MD;  Location: [place];  Service: Orthopedics;  Laterality: Right;   WISDOM TOOTH EXTRACTION     Social History   Socioeconomic History   Marital status: Single    Spouse name: Not on file   Number of children: 0   Years of education: Not on file   Highest education level: Bachelor's degree (e.g., BA, AB, BS)  Occupational History   Not on file  Tobacco Use   Smoking status: Former    Types: Cigarettes    Quit date: 06/13/2021    Years since quitting: 1.1   Smokeless tobacco: Never  Vaping Use   Vaping Use: Every day  Substance and Sexual Activity   Alcohol use: No   Drug use: No   Sexual activity: Not Currently    Birth control/protection: I.U.D.  Other Topics Concern   Not on file  Social History Narrative   Not on file   Social  Determinants of Health   Financial Resource Strain: Medium Risk (01/21/2018)   Overall Financial Resource Strain (CARDIA)    Difficulty of Paying Living Expenses: Somewhat hard  Food Insecurity: No Food Insecurity (01/21/2018)   Hunger Vital Sign    Worried About Running Out of Food in the Last Year: Never true    Ran Out of Food in the Last Year: Never true  Transportation Needs: No Transportation Needs (01/21/2018)   PRAPARE - Hydrologist (Medical): No    Lack of Transportation (Non-Medical): No  Physical Activity: Inactive (01/21/2018)   Exercise Vital Sign    Days of Exercise per Week: 0 days    Minutes of Exercise per Session: 0 min  Stress: Stress Concern Present (01/21/2018)   [place]    Feeling of Stress : To some extent  Social Connections: Moderately Isolated (01/21/2018)   Social Connection and Isolation Panel [NHANES]    Frequency of Communication with Friends and Family: More than three times a week    Frequency of Social Gatherings with Friends and Family: More than three times a week    Attends Religious Services: Never  Active Member of Clubs or Organizations: No    Attends Archivist Meetings: Never    Marital Status: Never married   Family History  Problem Relation Age of Onset   Bipolar disorder Mother    Hypertension Mother    Miscarriages / [place] Mother    Vision loss Mother    Varicose Veins Mother    Alcohol abuse Father    Heart disease Father    Learning disabilities Father    Bipolar disorder Sister    Learning disabilities Sister    Diabetes Paternal Aunt    Bipolar disorder Maternal Grandmother    Hypertension Maternal Grandfather    Cancer Paternal Grandmother    Cancer Paternal Grandfather    No Known Allergies Current Outpatient Medications  Medication Sig Dispense Refill   acetaminophen (TYLENOL) 500 MG tablet Take 1,000 mg by  mouth every 6 (six) hours as needed for mild pain.     ARIPiprazole (ABILIFY) 5 MG tablet Take 1 tablet by mouth once daily 90 tablet 0   aspirin EC 325 MG tablet Take 1 tablet (325 mg total) by mouth daily. 30 tablet 0   atomoxetine (STRATTERA) 40 MG capsule Take 1 capsule (40 mg total) by mouth daily. 90 capsule 0   citalopram (CELEXA) 20 MG tablet Take 1 tablet (20 mg total) by mouth daily. 90 tablet 0   clonazePAM (KLONOPIN) 0.5 MG tablet Take 1 tablet (0.5 mg total) by mouth 3 (three) times daily as needed for anxiety. 90 tablet 2   lamoTRIgine (LAMICTAL) 200 MG tablet Take 1 tablet (200 mg total) by mouth daily. 90 tablet 0   latanoprost (XALATAN) 0.005 % ophthalmic solution Place 1 drop into both eyes at bedtime.     methocarbamol (ROBAXIN) 500 MG tablet Take 1 tablet (500 mg total) by mouth 4 (four) times daily. 30 tablet 2   traZODone (DESYREL) 150 MG tablet Take 1 tablet (150 mg total) by mouth at bedtime. 90 tablet 0   No current facility-administered medications for this visit.   No results found.  Review of Systems:   A ROS was performed including pertinent positives and negatives as documented in the HPI.   Musculoskeletal Exam:    There were no vitals taken for this visit.  Right leg with mild swelling compared to the contralateral side.  This is improved since last visit.  No calf tenderness or palpable cord.  Incisions are well-appearing.  0 degrees to 120 degrees.  There is no medial or lateral joint line tenderness.  Sensation is intact in all distributions.  Fires tibialis anterior as well as EHL and gastrocsoleus.  2+ dorsalis pedis pulse  Imaging:    Limb length views right leg: She now has an approximately neutral axis with plumbline running through the medial tibial spine  X-ray right 4 views: There is a lucency near the osteotomy consistent with a nondisplaced fracture through the lateral tibial plateau.  Overall improved x-rays today show very good alignment  status post osteotomy.  There is interval healing at the lucent fracture site as well as the at the osteotomy site  I personally reviewed and interpreted the radiographs.   Assessment:   46 year old female 10 weeks status post right knee high tibial osteotomy overall doing extremely well.  At this time she has no pain in the knee.  Her fracture lucency is healing and she is overall much improved.  She is now walking without any assistive devices and continues to improve on a  weekly basis.  I will plan to see her back in 2 months for follow-up and repeat x-rays Plan :    -Return to clinic in 8 weeks      I personally saw and evaluated the patient, and participated in the management and treatment plan.  Vanetta Mulders, MD Attending Physician, Orthopedic Surgery  This document was dictated using Dragon voice recognition software. A reasonable attempt at proof reading has been made to minimize errors.

## 2022-07-29 NOTE — Telephone Encounter (Signed)
PA FOR ABILIFY 5 MG TABS #90 SUBMITTED TO [employer] VIA COVER MY MEDS.  AWAITING DETERMINATION.

## 2022-07-30 ENCOUNTER — Ambulatory Visit (HOSPITAL_BASED_OUTPATIENT_CLINIC_OR_DEPARTMENT_OTHER): Payer: Medicaid Other | Admitting: Physical Therapy

## 2022-07-30 ENCOUNTER — Encounter (HOSPITAL_BASED_OUTPATIENT_CLINIC_OR_DEPARTMENT_OTHER): Payer: Self-pay | Admitting: Physical Therapy

## 2022-07-30 DIAGNOSIS — G8929 Other chronic pain: Secondary | ICD-10-CM

## 2022-07-30 DIAGNOSIS — M25661 Stiffness of right knee, not elsewhere classified: Secondary | ICD-10-CM

## 2022-07-30 DIAGNOSIS — M25561 Pain in right knee: Secondary | ICD-10-CM | POA: Diagnosis not present

## 2022-07-30 DIAGNOSIS — R600 Localized edema: Secondary | ICD-10-CM

## 2022-07-30 DIAGNOSIS — R2689 Other abnormalities of gait and mobility: Secondary | ICD-10-CM

## 2022-07-30 NOTE — Therapy (Signed)
OUTPATIENT PHYSICAL THERAPY LOWER EXTREMITY Treatment/Discharge    Patient Name: Sarah Salas MRN: [id number] DOB:03-Nov-1975, 46 y.o., female Today's Date: 07/30/2022   PT End of Session - 07/30/22 0803     Visit Number 10    Number of Visits 16    Date for PT Re-Evaluation 09/03/22    Authorization Type [employer]    PT Start Time 0800    PT Stop Time 0842    PT Time Calculation (min) 42 min    Activity Tolerance Patient tolerated treatment well    Behavior During Therapy [facility] for tasks assessed/performed;Impulsive                  Past Medical History:  Diagnosis Date   ADHD (attention deficit hyperactivity disorder)    Anxiety    Arthritis    Bipolar 1 disorder ([place])    Brain injury ([place]) 2005   Glaucoma    Hypertension    no longer treated   Past Surgical History:  Procedure Laterality Date   BRAIN SURGERY  10/28/2003   from [facility]   KNEE ARTHROSCOPY WITH MENISCAL REPAIR Right 05/19/2022   Procedure: RIGHT KNEE ARTHROSCOPY, MEDIAL MENISCAL DEBRIDEMENT AND ABRASION ARTHROPLASTY;  Surgeon: Vanetta Mulders, MD;  Location: [place];  Service: Orthopedics;  Laterality: Right;   TIBIA OSTEOTOMY Right 05/19/2022   Procedure: RIGHT KNEE HIGH TIBIAL OSTEOTOMY;  Surgeon: Vanetta Mulders, MD;  Location: [place];  Service: Orthopedics;  Laterality: Right;   WISDOM TOOTH EXTRACTION     Patient Active Problem List   Diagnosis Date Noted   Closed fracture of right tibial plateau with malunion 05/19/2022   Anxiety 11/08/2017   Bipolar affective disorder ([place]) 11/05/2017    PCP: Archie Patten MD  REFERRING PROVIDER: Dr Vanetta Mulders   REFERRING DIAG: R knee high tibial osteotomy, meniscal debridement, abrasion arthroplasty  THERAPY DIAG:  Chronic pain of right knee  Other abnormalities of gait and mobility  Localized edema  Stiffness of right knee, not elsewhere classified  Rationale for Evaluation and Treatment Rehabilitation  ONSET DATE: 05/19/2022  Days since  surgery: 72   SUBJECTIVE:   SUBJECTIVE STATEMENT: The patien thas been walking without a device. She reports a the end of the day it gets sore. She was sore after the last visit but she went and did a lot of walking.  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 Yes: NPRS scale: 3/10 Pain location: R knee  Pain description: aching and sharp  Aggravating factors: standing and moving  Relieving factors: rest   10/4 PRECAUTIONS: None  WEIGHT BEARING RESTRICTIONS Yes 50% WBAT now progress to WBAT in 2 weeks from 8/24  FALLS:  Has patient fallen in last 6 months? No  LIVING ENVIRONMENT: 2 level house; coming down the steps she has figured out a way to do it that is less painful   OCCUPATION: On [employer] 2nd to traumatic brain injury   PLOF: Independent with pain   PATIENT GOALS  Back to speed walking >1 mile    OBJECTIVE:   GAIT: WBAT with SPC. Pt advised to keep in L hand but prefers to use R. Pt able to perform step through gait at end of session when cued for quad set in standing and toe off    LOWER EXTREMITY ROM:   Passive ROM Right eval Left eval   Hip flexion       Hip extension  Hip abduction       Hip adduction       Hip internal rotation       Hip external rotation       Knee flexion 38   120  Knee extension 0   -3  Ankle dorsiflexion       Ankle plantarflexion       Ankle inversion       Ankle eversion        (Blank rows = not tested)   LOWER EXTREMITY MMT:   MMT Right eval Left eval right left  Hip flexion  38.1 40  42.5 39.7  Hip extension        Hip abduction  66.2 51 61.2 54.4  Hip adduction        Hip internal rotation        Hip external rotation        Knee flexion        Knee extension  26.4  34.4 40.2 52.0  Ankle dorsiflexion        Ankle plantarflexion        Ankle inversion        Ankle eversion         (Blank rows = not tested)not tested 2nd to recent surgery       TODAY'S TREATMENT: 10/17 Manual:knee flexion and ext mob grade III to tolerance; able to reach 120 flex and 0 ext Patella mobilization; STM to posterior knee; PA and AP glides to improve flexion and extension   LAQ 3x10   Reviewed self knee extension stretch 3x20 sec   TKE 3x10 red   Squats 3x10   Reviewed whole HEP and how to progress at home   Lateral band walk red 3x10  Forward band walk 3x10 red       10/12 Manual:knee flexion and ext mob grade III to tolerance; able to reach 120 flex and 0 ext Patella mobilization; STM to posterior knee; PA and AP glides to improve flexion and extension   SAQ 3x10 1.5 lbs   SLR 3x10  LAQ 3x10 1.5 lbs   Heel raise x20 with cuing 2x10 Slow march with leftfro right weight bearing 2x10    Step up 4 inch 3x10  Lateral step up 2x10  10/4 Manual:knee flexion and ext mob grade III to tolerance; able to reach 120 flex and 0 ext Patella mobilization   Quad set x20  SAQ 3x10 1.5 lbs   SLR 3x10  LAQ 3x10 1.5 lbs   Heel raise x20 with cuing 2x10 Slow march with leftfro right weight bearing 2x10    Step up 2 inch 2x10  Lateral step up 2x10      PATIENT EDUCATION:  Education details: HEP; progression of weight bearing; swelling and symptom mangement  Person educated: Patient Education method: Explanation, Demonstration, Tactile cues, Verbal cues, and Handouts Education comprehension: verbalized understanding, returned demonstration, verbal cues required, tactile cues required, and needs further education   HOME EXERCISE PROGRAM: Access Code: [email or web address] URL: [email or web address] Date: 05/22/2022 Prepared by: Carolyne Littles  ASSESSMENT:  CLINICAL IMPRESSION: The patient has made great progress. She continues to have an antalgic gait. Her strength is good and her stability is good. A lot of the limp is likely coming from confidence in the knee That will come with time and repetition. She feels at this time  like she can do the exercises and walking on her own. She is still lacking end range extension. We reviewed how to  stretch at home. She reports she can feel it when she stands and walks. We also emphasized TKE's for her home plan. See below for goal specific progress. D/C to HEP at this time.   OBJECTIVE IMPAIRMENTS Abnormal gait, decreased activity tolerance, difficulty walking, decreased ROM, decreased strength, and pain.  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   REHAB POTENTIAL: Good  CLINICAL DECISION MAKING: Evolving/moderate complexity pain causing limited mobility   EVALUATION COMPLEXITY: Moderate   GOALS: Goals reviewed with patient? Yes  SHORT TERM GOALS: Target date: 08/20/2022  Patient will increase right knee flexion to 90 degrees  Baseline: Goal status: achieved   2.  Patient will ambulate 500' with weight bearing without increased pain  Baseline:  Goal status: ambulating community distances with minor pain achieved  3.  Patient will be independent with basic HEP  Baseline: Has basic HEP   4.  Patient will transition to a cane when safe  Baseline:  Goal status: Not using a device achieve   LONG TERM GOALS: Target date: 09/24/2022   Patient will go up/down a flight of steps without pain  Baseline:  Goal status: Is going up steps at home achieved   2.  Patient will stand for 1 hour without pain in order to perform ADL's  Baseline:  Goal status:standing for about 1/2 hour not met   3.  Patient will ambulate 3000' without self reported pain in order to perform ADL's  Baseline:  Goal status: ambulating community distances with only minor pain       PLAN: PT FREQUENCY: 2x/week  PT DURATION: 8 weeks  PLANNED INTERVENTIONS: Therapeutic exercises, Therapeutic activity, Neuromuscular re-education, Balance training, Gait training, Patient/Family education, Self Care, Joint mobilization, Stair training, DME instructions, Aquatic Therapy, Electrical stimulation, Cryotherapy, Moist heat, Taping, Ultrasound, and Manual therapy  PLAN FOR NEXT SESSION: continue to progress weight bearing activity   Carolyne Littles PT DPT  07/30/22 8:20 AM

## 2022-08-06 ENCOUNTER — Encounter (HOSPITAL_BASED_OUTPATIENT_CLINIC_OR_DEPARTMENT_OTHER): Payer: Medicaid Other | Admitting: Physical Therapy

## 2022-08-13 ENCOUNTER — Encounter (HOSPITAL_BASED_OUTPATIENT_CLINIC_OR_DEPARTMENT_OTHER): Payer: Medicaid Other | Admitting: Physical Therapy

## 2022-08-13 ENCOUNTER — Encounter (INDEPENDENT_AMBULATORY_CARE_PROVIDER_SITE_OTHER): Payer: Self-pay | Admitting: Internal Medicine

## 2022-08-13 ENCOUNTER — Ambulatory Visit (INDEPENDENT_AMBULATORY_CARE_PROVIDER_SITE_OTHER): Payer: Medicaid Other | Admitting: Internal Medicine

## 2022-08-13 VITALS — BP 119/83 | HR 93 | Temp 97.6°F | Ht 69.25 in | Wt 263.2 lb

## 2022-08-13 DIAGNOSIS — E669 Obesity, unspecified: Secondary | ICD-10-CM | POA: Diagnosis not present

## 2022-08-13 DIAGNOSIS — Z6838 Body mass index (BMI) 38.0-38.9, adult: Secondary | ICD-10-CM | POA: Diagnosis not present

## 2022-08-13 DIAGNOSIS — E785 Hyperlipidemia, unspecified: Secondary | ICD-10-CM

## 2022-08-13 NOTE — Progress Notes (Signed)
Office: [phone]  /  Fax: [phone]   Initial Visit  Sarah Salas was seen in clinic today to evaluate for obesity. Sarah Salas is interested in losing weight to improve overall health and reduce the risk of weight related complications. Sarah Salas presents today to review program treatment options, initial physical assessment, and evaluation.  Sarah Salas is a very pleasant 46 year old female who had gained substantial weight several years ago due to antiepileptic drugs.  Sarah Salas medications have been changed and Sarah Salas managed to lose weight.  Sarah Salas has been doing protein shakes, eating vegetables and salads.  And also began to walk but Sarah Salas encountered some setbacks along the way after quitting smoking Sarah Salas gained 50 to 60 pounds Sarah Salas also had injure Sarah Salas knee and required surgery which resulted in decreased physical activity.  Sarah Salas was referred by: PCP  When asked what else they would like to accomplish? Sarah Salas states: Adopt healthier eating patterns, Improve existing medical conditions, and Improve quality of life  When asked how has your weight affected you? Sarah Salas states: Contributed to medical problems and Having poor endurance  Some associated conditions: Arthritis and Hyperlipidemia  Contributing factors: Family history, Nutritional, Eating patterns, and Life event  Weight promoting medications identified: Psychotropic medications  Current nutrition plan: Portion control / smart choices  Current level of physical activity: Walking  Current or previous pharmacotherapy: None  Response to medication: Never tried medications   Past medical history includes:   Past Medical History:  Diagnosis Date   ADHD (attention deficit hyperactivity disorder)    Anxiety    Arthritis    Bipolar 1 disorder ([place])    Brain injury ([place]) 2005   Glaucoma    Hypertension    no longer treated     Objective:   BP 119/83   Pulse 93   Temp 97.6 F (36.4 C)   Ht 5' 9.25" (1.759 m)   Wt 263 lb 3.2 oz (119.4 kg)   SpO2  100%   BMI 38.59 kg/m  Sarah Salas was weighed on the bioimpedance scale: Body mass index is 38.59 kg/m.  Peak Weight: 450,Visceral Fat Rating: 13, Body Fat%: 47.9, Weight trend over the last 12 months: Increasing  General:  Alert, oriented and cooperative. Sarah Salas is in no acute distress.  Respiratory: Normal respiratory effort, no problems with respiration noted  Extremities: Normal range of motion.    Mental Status: Normal mood and affect. Normal behavior. Normal judgment and thought content.   Assessment and Plan:  1. Class 2 obesity with body mass index (BMI) of 38.0 to 38.9 in adult, unspecified obesity type, unspecified whether serious comorbidity present We reviewed weight, biometrics, associated medical conditions and contributing factors with Sarah Salas. Sarah Salas would benefit from weight loss therapy via a modified calorie, low-carb, high-protein nutritional plan tailored to Sarah Salas metabolic rate.  We will be coming in for a nutritional consult in 1 to 2 weeks where Sarah Salas will be started on journaling with a target calorie and daily protein intake.  He is on several psychotropic medication which may contribute to some weight gain.  Sarah Salas is followed by psychiatry.    2. Hyperlipidemia, unspecified hyperlipidemia type Sarah Salas last LDL was 152.  Sarah Salas is not on medical therapy.  Sarah Salas will benefit from the diet low in saturated fats less than 15% of daily calories.  We will assess cardiovascular risk at Sarah Salas next office visit.  Sarah Salas had normal hemoglobin A1c.  Sarah Salas had a blood sugar of 125 but this was nonfasting.  Blood pressure is adequately  controlled.        Obesity Treatment / Action Plan:  Sarah Salas will work on garnering support from family and friends to begin weight loss journey. Will work on eliminating or reducing the presence of highly palatable, calorie dense foods in the home. Will complete provided nutritional and psychosocial assessment questionnaire before the next appointment. Will be scheduled  for indirect calorimetry to determine resting energy expenditure in a fasting state.  This will allow [place] to create a reduced calorie, high-protein meal plan to promote loss of fat mass while preserving muscle mass. Was counseled on nutritional approaches to weight loss and benefits of complex carbs and high quality protein as part of nutritional weight management.  Obesity Education Performed Today:  Sarah Salas was weighed on the bioimpedance scale and results were discussed and documented in the synopsis.  We discussed obesity as a disease and the importance of a more detailed evaluation of all the factors contributing to the disease.  We discussed the importance of long term lifestyle changes which include nutrition, exercise and behavioral modifications as well as the importance of customizing this to Sarah Salas specific health and social needs.  We discussed the benefits of reaching a healthier weight to alleviate the symptoms of existing conditions and reduce the risks of the biomechanical, metabolic and psychological effects of obesity.  Sarah Salas appears to be in the action stage of change and states they are ready to start intensive lifestyle modifications and behavioral modifications.  30 minutes was spent today on this visit including the above counseling, pre-visit chart review, and post-visit documentation.  Reviewed by clinician on day of visit: allergies, medications, problem list, medical history, surgical history, family history, social history, and previous encounter notes.     I have reviewed the above documentation for accuracy and completeness, and I agree with the above.  Worthy Rancher, MD

## 2022-08-20 ENCOUNTER — Encounter: Payer: Self-pay | Admitting: Nurse Practitioner

## 2022-08-21 ENCOUNTER — Other Ambulatory Visit: Payer: Self-pay | Admitting: Nurse Practitioner

## 2022-08-21 DIAGNOSIS — M545 Low back pain, unspecified: Secondary | ICD-10-CM

## 2022-08-21 MED ORDER — METHOCARBAMOL 500 MG PO TABS: 500.0000 mg | ORAL_TABLET | Freq: Three times a day (TID) | ORAL | 1 refills | Status: DC | PRN

## 2022-08-22 ENCOUNTER — Other Ambulatory Visit: Payer: Self-pay | Admitting: Nurse Practitioner

## 2022-08-22 MED ORDER — GABAPENTIN 100 MG PO CAPS: 100.0000 mg | ORAL_CAPSULE | Freq: Three times a day (TID) | ORAL | 0 refills | Status: DC

## 2022-08-27 ENCOUNTER — Encounter (INDEPENDENT_AMBULATORY_CARE_PROVIDER_SITE_OTHER): Payer: Self-pay | Admitting: Internal Medicine

## 2022-08-27 ENCOUNTER — Ambulatory Visit (INDEPENDENT_AMBULATORY_CARE_PROVIDER_SITE_OTHER): Payer: Medicaid Other | Admitting: Internal Medicine

## 2022-08-27 VITALS — BP 104/66 | HR 88 | Temp 97.5°F | Ht 69.0 in | Wt 262.0 lb

## 2022-08-27 DIAGNOSIS — F39 Unspecified mood [affective] disorder: Secondary | ICD-10-CM

## 2022-08-27 DIAGNOSIS — E669 Obesity, unspecified: Secondary | ICD-10-CM | POA: Diagnosis not present

## 2022-08-27 DIAGNOSIS — Z6838 Body mass index (BMI) 38.0-38.9, adult: Secondary | ICD-10-CM | POA: Diagnosis not present

## 2022-08-29 ENCOUNTER — Encounter (HOSPITAL_BASED_OUTPATIENT_CLINIC_OR_DEPARTMENT_OTHER): Payer: Self-pay | Admitting: Physical Therapy

## 2022-09-11 ENCOUNTER — Other Ambulatory Visit: Payer: Self-pay | Admitting: Nurse Practitioner

## 2022-09-12 NOTE — Progress Notes (Unsigned)
Chief Complaint:   OBESITY Sarah Salas is here to discuss her progress with her obesity treatment plan along with follow-up of her obesity related diagnoses. Sarah Salas is not on a plan and states she is following her eating plan approximately 0% of the time. Sarah Salas states she is not currently exercising.  Today's visit was #: 2 Starting weight: 263 lbs Starting date: 08/13/2022 Today's weight: 262 lbs Today's date: 08/27/2022 Total lbs lost to date: 1 Total lbs lost since last in-office visit: 1  Interim History: Sarah Salas presents today to initiate nutritional plan for weight loss. She has not enrolled in the program. Calculated BMR 1938. PA level is low. Reviewed macronutrient's, TDEE, and healthier mood choices, and strategies. Bioimpedance 48% fat and visceral fat rating of 13.   Subjective:   1. Mood disorder (HCC) Sarah Salas is on psychotropic medication, which may contribute to weight gain.  Assessment/Plan:   1. Mood disorder (HCC) Consider metformin to assist with weight management.  2. Class 2 obesity with body mass index (BMI) of 38.0 to 38.9 in adult, unspecified obesity type, unspecified whether serious comorbidity present Sarah Salas is currently in the action stage of change. As such, her goal is to continue with weight loss efforts. She has agreed to the Category 2 Plan + 100 calories and keeping a food journal and adhering to recommended goals of 1300 calories and 90 protein.   Demonstrated use of tracking app. Pt will work on incorporating PA. She will be enrolling in the program in January for long term management.  Exercise goals:  As is  Behavioral modification strategies: increasing lean protein intake, decreasing simple carbohydrates, increasing vegetables, increasing water intake, decreasing liquid calories, increasing high fiber foods, decreasing eating out, no skipping meals, meal planning and cooking strategies, keeping healthy foods in the home, better snacking choices,  avoiding temptations, and planning for success.  Sarah Salas has agreed to follow-up with our clinic in 8 weeks. She was informed of the importance of frequent follow-up visits to maximize her success with intensive lifestyle modifications for her multiple health conditions.   Objective:   Blood pressure 104/66, pulse 88, temperature (!) 97.5 F (36.4 C), height 5\' 9"  (1.753 m), weight 262 lb (118.8 kg), SpO2 98 %. Body mass index is 38.69 kg/m.  General: Cooperative, alert, well developed, in no acute distress. HEENT: Conjunctivae and lids unremarkable. Cardiovascular: Regular rhythm.  Lungs: Normal work of breathing. Neurologic: No focal deficits.   Lab Results  Component Value Date   CREATININE 0.67 05/20/2022   BUN 16 05/20/2022   NA 139 05/20/2022   K 3.9 05/20/2022   CL 108 05/20/2022   CO2 24 05/20/2022   Lab Results  Component Value Date   ALT 16 01/21/2022   AST 12 01/21/2022   ALKPHOS 82 01/21/2022   BILITOT 0.3 01/21/2022   Lab Results  Component Value Date   HGBA1C 5.2 11/25/2020   HGBA1C 5.3 12/26/2019   No results found for: "INSULIN" Lab Results  Component Value Date   TSH 1.520 11/19/2017   Lab Results  Component Value Date   CHOL 229 (H) 01/21/2022   HDL 63 01/21/2022   LDLCALC 152 (H) 01/21/2022   TRIG 80 01/21/2022   CHOLHDL 3.6 01/21/2022   No results found for: "VD25OH" Lab Results  Component Value Date   WBC 6.7 05/12/2022   HGB 14.3 05/12/2022   HCT 43.4 05/12/2022   MCV 89.5 05/12/2022   PLT 306 05/12/2022   Attestation Statements:  Reviewed by clinician on day of visit: allergies, medications, problem list, medical history, surgical history, family history, social history, and previous encounter notes.  Time spent on visit including pre-visit chart review and post-visit care and charting was 30 minutes.   I, Kyung Rudd, BS, CMA, am acting as transcriptionist for Worthy Rancher, MD.  I have reviewed the above documentation  for accuracy and completeness, and I agree with the above. -  ***

## 2022-09-14 NOTE — Telephone Encounter (Signed)
NO FILL  UNTIL 12-12. TOO EARLY RIGHT NOW

## 2022-09-28 ENCOUNTER — Ambulatory Visit (INDEPENDENT_AMBULATORY_CARE_PROVIDER_SITE_OTHER): Payer: Medicaid Other

## 2022-09-28 ENCOUNTER — Ambulatory Visit (INDEPENDENT_AMBULATORY_CARE_PROVIDER_SITE_OTHER): Payer: Medicaid Other | Admitting: Orthopaedic Surgery

## 2022-09-28 DIAGNOSIS — M21161 Varus deformity, not elsewhere classified, right knee: Secondary | ICD-10-CM

## 2022-09-28 DIAGNOSIS — M25561 Pain in right knee: Secondary | ICD-10-CM

## 2022-09-28 MED ORDER — TRIAMCINOLONE ACETONIDE 40 MG/ML IJ SUSP
80.0000 mg | INTRAMUSCULAR | Status: AC | PRN
  Administered 2022-09-28: 80 mg via INTRA_ARTICULAR

## 2022-09-28 MED ORDER — LIDOCAINE HCL 1 % IJ SOLN
4.0000 mL | INTRAMUSCULAR | Status: AC | PRN
  Administered 2022-09-28: 4 mL
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
## 2022-09-30 ENCOUNTER — Encounter (HOSPITAL_BASED_OUTPATIENT_CLINIC_OR_DEPARTMENT_OTHER): Payer: Self-pay | Admitting: Orthopaedic Surgery

## 2022-10-01 ENCOUNTER — Encounter: Payer: Self-pay | Admitting: Nurse Practitioner

## 2022-10-07 DIAGNOSIS — Z0289 Encounter for other administrative examinations: Secondary | ICD-10-CM

## 2022-10-13 ENCOUNTER — Other Ambulatory Visit: Payer: Self-pay | Admitting: Nurse Practitioner

## 2022-10-13 DIAGNOSIS — Z1231 Encounter for screening mammogram for malignant neoplasm of breast: Secondary | ICD-10-CM

## 2022-10-21 ENCOUNTER — Telehealth (HOSPITAL_BASED_OUTPATIENT_CLINIC_OR_DEPARTMENT_OTHER): Payer: Medicaid Other | Admitting: Psychiatry

## 2022-10-21 ENCOUNTER — Encounter (HOSPITAL_COMMUNITY): Payer: Self-pay | Admitting: Psychiatry

## 2022-10-21 VITALS — Wt 270.0 lb

## 2022-10-21 DIAGNOSIS — F319 Bipolar disorder, unspecified: Secondary | ICD-10-CM

## 2022-10-21 DIAGNOSIS — F902 Attention-deficit hyperactivity disorder, combined type: Secondary | ICD-10-CM | POA: Diagnosis not present

## 2022-10-21 DIAGNOSIS — F419 Anxiety disorder, unspecified: Secondary | ICD-10-CM | POA: Diagnosis not present

## 2022-10-21 MED ORDER — ATOMOXETINE HCL 40 MG PO CAPS: 40.0000 mg | ORAL_CAPSULE | Freq: Every day | ORAL | 0 refills | Status: DC

## 2022-10-21 MED ORDER — CITALOPRAM HYDROBROMIDE 20 MG PO TABS: 20.0000 mg | ORAL_TABLET | Freq: Every day | ORAL | 0 refills | Status: DC

## 2022-10-21 MED ORDER — CLONAZEPAM 0.5 MG PO TABS: 0.5000 mg | ORAL_TABLET | Freq: Three times a day (TID) | ORAL | 2 refills | Status: DC | PRN

## 2022-10-21 MED ORDER — LAMOTRIGINE 200 MG PO TABS: 200.0000 mg | ORAL_TABLET | Freq: Every day | ORAL | 0 refills | Status: DC

## 2022-10-21 MED ORDER — TRAZODONE HCL 100 MG PO TABS: 200.0000 mg | ORAL_TABLET | Freq: Every day | ORAL | 2 refills | Status: DC

## 2022-10-21 NOTE — Progress Notes (Signed)
Virtual Visit via Telephone Note  I connected with Sarah Salas on 10/21/22 at  8:40 AM EST by telephone and verified that I am speaking with the correct person using two identifiers.  Location: Patient: Home Provider: Home Office   I discussed the limitations, risks, security and privacy concerns of performing an evaluation and management service by telephone and the availability of in person appointments. I also discussed with the patient that there may be a patient responsible charge related to this service. The patient expressed understanding and agreed to proceed.   History of Present Illness: Patient is evaluated by phone session.  She reported poor sleep and racing thoughts.  She feels the current trazodone is not working.  She still struggle with attention and focus and multitasking but reported symptoms are manageable.  She is getting along with her mother but sometimes having issues with the stepfather.  Patient told he sleeps all the time and when she says something to him that he get upset.  Patient decided not to talk to him directly and if needed communication and then she called and talked to her mother.  Patient told mother is recovering very well and her health conditions are stable.  Patient reported not taking the Abilify but has not lost the weight.  She actually gained a few pounds since the last visit.  Her PCP is during her to weight loss program.  She reported her holidays were good.  She had a good relationship with a boyfriend and now thinking about getting married in the future.  She denies any suicidal thoughts.  Her thought process remains circumstantial and her speech remains pressured and fast.  However she denies any agitation, crying spells or any feeling of hopelessness or worthlessness.  She does struggle controlling her mood sometime back denies any mania or hallucination.  Her younger sister Sarah Salas checks on her on a regular basis.  She does not talk to her older  sister.  She is taking gabapentin prescribed by her PCP to help her pain.  She also taking methocarbamol.  She has no tremors, shakes or any EPS.   Past Psychiatric History:  H/O ADHD, bipolar disorder.  Vyvanse did not help.  Adderall helped. Latuda help in the beginning but then got worse with increased dose.  Vistaril did not help.  No h/o paranoia, inpatient treatment or suicidal attempt. H/O TBI.  Abilify helped but increased weight gain and blood sugar.  Psychiatric Specialty Exam: Physical Exam  Review of Systems  Weight 270 lb (122.5 kg).Body mass index is 39.87 kg/m.  General Appearance: NA  Eye Contact:  NA  Speech:   fast , pressured  Volume:  Increased  Mood:  Anxious  Affect:  NA  Thought Process:  Descriptions of Associations: Circumstantial  Orientation:  Full (Time, Place, and Person)  Thought Content:  Rumination  Suicidal Thoughts:  No  Homicidal Thoughts:  No  Memory:  Immediate;   Fair Recent;   Fair Remote;   Fair  Judgement:  Fair  Insight:  Shallow  Psychomotor Activity:  Increased  Concentration:  Concentration: Fair and Attention Span: Fair  Recall:  [employer] of Knowledge:  Fair  Language:  Fair  Akathisia:  No  Handed:  Right  AIMS (if indicated):     Assets:  Communication Skills Desire for Improvement Housing Transportation  ADL's:  Intact  Cognition:  WNL  Sleep:   5-6 hrs      Assessment and Plan: Bipolar disorder type  I.  Anxiety.  ADHD, combined type.  Discussed insomnia.  She is no longer taking Abilify but her weight remains challenging.  Now PCP referred to weight loss program.  Discussed medication side effects and I encourage walking, watching calorie intake.  We will optimize trazodone from 150 mg to 200 mg to help with insomnia.  I recommend to remain off from Abilify for now.  Continue Klonopin 0.5 mg 3 times a day, Celexa 20 mg daily, Lamictal 200 mg daily and Strattera 40 mg daily.  Recommend to call us back if she has any  question or any concern.  Follow-up in 3 months.  She is also taking gabapentin and muscle relaxant.  Follow Up Instructions:    I discussed the assessment and treatment plan with the patient. The patient was provided an opportunity to ask questions and all were answered. The patient agreed with the plan and demonstrated an understanding of the instructions.   The patient was advised to call back or seek an in-person evaluation if the symptoms worsen or if the condition fails to improve as anticipated.  Collaboration of Care: Other provider involved in patient's care AEB notes are available in epic to review.  Patient/Guardian was advised Release of Information must be obtained prior to any record release in order to collaborate their care with an outside provider. Patient/Guardian was advised if they have not already done so to contact the registration department to sign all necessary forms in order for [place] to release information regarding their care.   Consent: Patient/Guardian gives verbal consent for treatment and assignment of benefits for services provided during this visit. Patient/Guardian expressed understanding and agreed to proceed.    I provided 28 minutes of non-face-to-face time during this encounter.   Kathlee Nations, MD

## 2022-11-04 ENCOUNTER — Encounter (INDEPENDENT_AMBULATORY_CARE_PROVIDER_SITE_OTHER): Payer: Self-pay | Admitting: Internal Medicine

## 2022-11-04 ENCOUNTER — Ambulatory Visit (INDEPENDENT_AMBULATORY_CARE_PROVIDER_SITE_OTHER): Payer: Medicaid Other | Admitting: Internal Medicine

## 2022-11-04 VITALS — BP 113/74 | HR 78 | Temp 97.6°F | Ht 69.0 in | Wt 268.0 lb

## 2022-11-04 DIAGNOSIS — E7800 Pure hypercholesterolemia, unspecified: Secondary | ICD-10-CM

## 2022-11-04 DIAGNOSIS — R5383 Other fatigue: Secondary | ICD-10-CM

## 2022-11-04 DIAGNOSIS — Z1331 Encounter for screening for depression: Secondary | ICD-10-CM

## 2022-11-04 DIAGNOSIS — Z6833 Body mass index (BMI) 33.0-33.9, adult: Secondary | ICD-10-CM | POA: Insufficient documentation

## 2022-11-04 DIAGNOSIS — R0602 Shortness of breath: Secondary | ICD-10-CM | POA: Diagnosis not present

## 2022-11-04 DIAGNOSIS — Z6839 Body mass index (BMI) 39.0-39.9, adult: Secondary | ICD-10-CM | POA: Diagnosis not present

## 2022-11-04 DIAGNOSIS — E66811 Obesity, class 1: Secondary | ICD-10-CM | POA: Insufficient documentation

## 2022-11-04 DIAGNOSIS — E669 Obesity, unspecified: Secondary | ICD-10-CM | POA: Diagnosis not present

## 2022-11-04 DIAGNOSIS — E66812 Obesity, class 2: Secondary | ICD-10-CM

## 2022-11-04 DIAGNOSIS — Z6836 Body mass index (BMI) 36.0-36.9, adult: Secondary | ICD-10-CM

## 2022-11-04 DIAGNOSIS — R7309 Other abnormal glucose: Secondary | ICD-10-CM | POA: Diagnosis not present

## 2022-11-04 NOTE — Progress Notes (Unsigned)
Chief Complaint:   OBESITY Sarah Salas (MR# [id number]) is a 47 y.o. female who presents for evaluation and treatment of obesity and related comorbidities. Current BMI is Body mass index is 39.87 kg/m. Sarah Salas has been struggling with her weight for many years and has been unsuccessful in either losing weight, maintaining weight loss, or reaching her healthy weight goal.  Sarah Salas is currently in the action stage of change and ready to dedicate time achieving and maintaining a healthier weight. Sarah Salas is interested in becoming our patient and working on intensive lifestyle modifications including (but not limited to) diet and exercise for weight loss.  Sarah Salas's habits were reviewed today and are as follows: she thinks her family will eat healthier with her, her desired weight loss is 98 lbs, she has been heavy most of her life, her heaviest weight ever was 450 pounds, she has significant food cravings issues, she snacks frequently in the evenings, she skips meals frequently, she is frequently drinking liquids with calories, she frequently makes poor food choices, she frequently eats larger portions than normal, and she struggles with emotional eating.  Depression Screen Sarah Salas's Food and Mood (modified PHQ-9) score was 18.  Subjective:   1. Other fatigue Sarah Salas admits to daytime somnolence and admits to waking up still tired. Patient has a history of symptoms of daytime fatigue and morning fatigue. Sarah Salas generally gets 7 or 8 hours of sleep per night, and states that she has nightime awakenings and generally restful sleep. Snoring is present. Apneic episodes are not present. Epworth Sleepiness Score is 8.   2. SOB (shortness of breath) on exertion Sarah Salas notes increasing shortness of breath with exercising and seems to be worsening over time with weight gain. She notes getting out of breath sooner with activity than she used to. This has not gotten worse recently. Sarah Salas denies shortness of breath  at rest or orthopnea.  3. Pure hypercholesterolemia She had an LDL of 152 and total cholesterol 229 with normal triglycerides and HDL cholesterol.  This may represent a spillover effect due to adiposity.    4. Abnormal glucose She had a blood glucose of 125 unsure if this was fasting.    Assessment/Plan:   1. Other fatigue Sarah Salas does feel that her weight is causing her energy to be lower than it should be. Fatigue may be related to obesity, depression or many other causes. Labs will be ordered, and in the meanwhile, Sarah Salas will focus on self care including making healthy food choices, increasing physical activity and focusing on stress reduction.  - Vitamin B12 - Comprehensive metabolic panel - TSH  2. SOB (shortness of breath) on exertion Sarah Salas does feel that she gets out of breath more easily that she used to when she exercises. Sarah Salas's shortness of breath appears to be obesity related and exercise induced. She has agreed to work on weight loss and gradually increase exercise to treat her exercise induced shortness of breath. Will continue to monitor closely.  3. Pure hypercholesterolemia We will repeat fasting lipid panel and calculate cardiovascular risk and determine if she benefits from lipid-lowering therapy.  She has normal blood pressure.  - TSH - Lipid Panel With LDL/HDL Ratio  4. Abnormal glucose We are checking a fasting blood sugar today including A1c and insulin levels for screening for insulin resistance and prediabetes.  - Hemoglobin A1c - Insulin, random  5. Depression screen Sarah Salas had a positive depression screening. Depression is commonly associated with obesity and often results in  emotional eating behaviors. We will monitor this closely and work on CBT to help improve the non-hunger eating patterns. Referral to Psychology may be required if no improvement is seen as she continues in our clinic.  6. Obesity, current BMI 39.6 We will check labs today.   -  VITAMIN D 25 Hydroxy (Vit-D Deficiency, Fractures)  Sarah Salas is currently in the action stage of change and her goal is to continue with weight loss efforts. I recommend Sarah Salas begin the structured treatment plan as follows:  She has agreed to the Category 2 Plan.  Exercise goals: Sarah Salas has been instructed to work up to 150 minutes of moderate intensity aerobic activity a week and strengthening exercises 2-3 times per week for cardiovascular health, weight loss maintenance and preservation of muscle mass.    Behavioral modification strategies: increasing lean protein intake, decreasing simple carbohydrates, increasing vegetables, increasing water intake, decreasing liquid calories, no skipping meals, meal planning and cooking strategies, keeping healthy foods in the home, better snacking choices, avoiding temptations, and planning for success.  She was informed of the importance of frequent follow-up visits to maximize her success with intensive lifestyle modifications for her multiple health conditions. She was informed we would discuss her lab results at her next visit unless there is a critical issue that needs to be addressed sooner. Sarah Salas agreed to keep her next visit at the agreed upon time to discuss these results.  Objective:   Blood pressure 113/74, pulse 78, temperature 97.6 F (36.4 C), height 5\' 9"  (1.753 m), SpO2 99 %. Body mass index is 39.87 kg/m.  EKG: Normal sinus rhythm, rate 74 BPM.  Indirect Calorimeter completed today shows a VO2 of 304 and a REE of 2102.  Her calculated basal metabolic rate is 2103 thus her basal metabolic rate is better than expected.  General: Cooperative, alert, well developed, in no acute distress. HEENT: Conjunctivae and lids unremarkable. Cardiovascular: Regular rhythm.  Lungs: Normal work of breathing. Neurologic: No focal deficits.   Lab Results  Component Value Date   CREATININE 0.75 11/04/2022   BUN 14 11/04/2022   NA 144 11/04/2022   K  4.9 11/04/2022   CL 106 11/04/2022   CO2 22 11/04/2022   Lab Results  Component Value Date   ALT 39 (H) 11/04/2022   AST 25 11/04/2022   ALKPHOS 127 (H) 11/04/2022   BILITOT 0.2 11/04/2022   Lab Results  Component Value Date   HGBA1C 5.6 11/04/2022   HGBA1C 5.2 11/25/2020   HGBA1C 5.3 12/26/2019   Lab Results  Component Value Date   INSULIN 13.6 11/04/2022   Lab Results  Component Value Date   TSH 1.200 11/04/2022   Lab Results  Component Value Date   CHOL 237 (H) 11/04/2022   HDL 64 11/04/2022   LDLCALC 161 (H) 11/04/2022   TRIG 68 11/04/2022   CHOLHDL 3.6 01/21/2022   Lab Results  Component Value Date   WBC 6.7 05/12/2022   HGB 14.3 05/12/2022   HCT 43.4 05/12/2022   MCV 89.5 05/12/2022   PLT 306 05/12/2022   No results found for: "IRON", "TIBC", "FERRITIN"  Attestation Statements:   Reviewed by clinician on day of visit: allergies, medications, problem list, medical history, surgical history, family history, social history, and previous encounter notes.  Time spent on visit including pre-visit chart review and post-visit charting and care was 40 minutes.   07/12/2022, am acting as transcriptionist for Trude Mcburney, MD.  I have reviewed the above documentation  for accuracy and completeness, and I agree with the above. -Thomes Dinning, MD

## 2022-11-05 LAB — COMPREHENSIVE METABOLIC PANEL
ALT: 39 IU/L — ABNORMAL HIGH (ref 0–32)
AST: 25 IU/L (ref 0–40)
Albumin/Globulin Ratio: 2.2 (ref 1.2–2.2)
Albumin: 4.9 g/dL (ref 3.9–4.9)
Alkaline Phosphatase: 127 IU/L — ABNORMAL HIGH (ref 44–121)
BUN/Creatinine Ratio: 19 (ref 9–23)
BUN: 14 mg/dL (ref 6–24)
Bilirubin Total: 0.2 mg/dL (ref 0.0–1.2)
CO2: 22 mmol/L (ref 20–29)
Calcium: 10.2 mg/dL (ref 8.7–10.2)
Chloride: 106 mmol/L (ref 96–106)
Creatinine, Ser: 0.75 mg/dL (ref 0.57–1.00)
Globulin, Total: 2.2 g/dL (ref 1.5–4.5)
Glucose: 94 mg/dL (ref 70–99)
Potassium: 4.9 mmol/L (ref 3.5–5.2)
Sodium: 144 mmol/L (ref 134–144)
Total Protein: 7.1 g/dL (ref 6.0–8.5)
eGFR: 101 mL/min/{1.73_m2} (ref >59)

## 2022-11-05 LAB — VITAMIN D 25 HYDROXY (VIT D DEFICIENCY, FRACTURES): Vit D, 25-Hydroxy: 21.8 ng/mL — ABNORMAL LOW (ref 30.0–100.0)

## 2022-11-05 LAB — TSH: TSH: 1.2 u[IU]/mL (ref 0.450–4.500)

## 2022-11-05 LAB — HEMOGLOBIN A1C
Est. average glucose Bld gHb Est-mCnc: 114 mg/dL
Hgb A1c MFr Bld: 5.6 % (ref 4.8–5.6)

## 2022-11-05 LAB — LIPID PANEL WITH LDL/HDL RATIO
Cholesterol, Total: 237 mg/dL — ABNORMAL HIGH (ref 100–199)
HDL: 64 mg/dL (ref >39)
LDL Chol Calc (NIH): 161 mg/dL — ABNORMAL HIGH (ref 0–99)
LDL/HDL Ratio: 2.5 ratio (ref 0.0–3.2)
Triglycerides: 68 mg/dL (ref 0–149)
VLDL Cholesterol Cal: 12 mg/dL (ref 5–40)

## 2022-11-05 LAB — VITAMIN B12: Vitamin B-12: 353 pg/mL (ref 232–1245)

## 2022-11-05 LAB — INSULIN, RANDOM: INSULIN: 13.6 u[IU]/mL (ref 2.6–24.9)

## 2022-11-18 ENCOUNTER — Encounter (INDEPENDENT_AMBULATORY_CARE_PROVIDER_SITE_OTHER): Payer: Self-pay | Admitting: Internal Medicine

## 2022-11-18 ENCOUNTER — Ambulatory Visit (INDEPENDENT_AMBULATORY_CARE_PROVIDER_SITE_OTHER): Payer: Medicaid Other | Admitting: Internal Medicine

## 2022-11-18 VITALS — BP 118/78 | HR 68 | Temp 97.7°F | Ht 69.0 in

## 2022-11-18 DIAGNOSIS — E7800 Pure hypercholesterolemia, unspecified: Secondary | ICD-10-CM

## 2022-11-18 DIAGNOSIS — E669 Obesity, unspecified: Secondary | ICD-10-CM | POA: Diagnosis not present

## 2022-11-18 DIAGNOSIS — E559 Vitamin D deficiency, unspecified: Secondary | ICD-10-CM | POA: Diagnosis not present

## 2022-11-18 DIAGNOSIS — R7401 Elevation of levels of liver transaminase levels: Secondary | ICD-10-CM | POA: Insufficient documentation

## 2022-11-18 DIAGNOSIS — Z6838 Body mass index (BMI) 38.0-38.9, adult: Secondary | ICD-10-CM | POA: Diagnosis not present

## 2022-11-18 MED ORDER — ERGOCALCIFEROL 1.25 MG (50000 UT) PO CAPS: 50000.0000 [IU] | ORAL_CAPSULE | ORAL | 0 refills | Status: DC

## 2022-11-18 NOTE — Progress Notes (Signed)
Office: [phone]  /  Fax: [phone]  WEIGHT SUMMARY AND BIOMETRICS  Medical Weight Loss Height: 5\' 9"  (1.753 m) Weight: 265 lb 3.2 oz (120.3 kg) Temp: 97.7 F (36.5 C) Pulse Rate: 68 BP: 118/78 SpO2: 99 % Fasting: yes Labs: n Today's Visit #: 3 Weight at Last VIsit: 268 lb Weight Lost Since Last Visit: 3lb  Body Fat %: 48.5 % Fat Mass (lbs): 128.6 lbs Muscle Mass (lbs): 129.8 lbs Total Body Water (lbs): 93 lbs Visceral Fat Rating : 13 Peak Weight: 450 lb Starting Date: 08/27/22 Starting Weight: 268 lb Total Weight Loss (lbs): 3 lb (1.361 kg)    HPI  Chief Complaint: OBESITY  Sarah Salas is here to discuss her progress with her obesity treatment plan. She is on the the Category 2 Plan and states she is following her eating plan approximately 90 % of the time. She states she is exercising walking 35 minutes 5 times per week.   Interval History:  Since last office she reports excellent. adeherence to prescribed reduced calorie nutrition plan. Has been working on reducing unhealthy snacking, consumption of liquid calories and making healthier choices. Denies problems with appetite and hunger signals.  Denies problems with satiety and satiation.  Denies problems with eating patterns and portion control.  Sleeping approximately 7 hours a day.  Sleep described as restorative.  Stress levels are reported as low and manageable.  Barriers identified none.    Pharmacotherapy: none  PHYSICAL EXAM:  Blood pressure 118/78, pulse 68, temperature 97.7 F (36.5 C), height 5\' 9"  (1.753 m), SpO2 99 %. Body mass index is 39.87 kg/m.  General: She is overweight, cooperative, alert, well developed, and in no acute distress. PSYCH: Has normal mood, affect and thought process.   HEENT: EOMI, sclerae are anicteric. Lungs: Normal breathing effort, no conversational dyspnea. Extremities: No edema.  Neurologic: No gross sensory or motor deficits. No tremors or fasciculations  noted.    DIAGNOSTIC DATA REVIEWED:  BMET    Component Value Date/Time   NA 144 11/04/2022 0814   K 4.9 11/04/2022 0814   CL 106 11/04/2022 0814   CO2 22 11/04/2022 0814   GLUCOSE 94 11/04/2022 0814   GLUCOSE 125 (H) 05/20/2022 0155   BUN 14 11/04/2022 0814   CREATININE 0.75 11/04/2022 0814   CALCIUM 10.2 11/04/2022 0814   GFRNONAA >60 05/20/2022 0155   GFRAA 101 11/25/2020 0919   Lab Results  Component Value Date   HGBA1C 5.6 11/04/2022   HGBA1C 5.3 12/26/2019   Lab Results  Component Value Date   INSULIN 13.6 11/04/2022   Lab Results  Component Value Date   TSH 1.200 11/04/2022   CBC    Component Value Date/Time   WBC 6.7 05/12/2022 0959   RBC 4.85 05/12/2022 0959   HGB 14.3 05/12/2022 0959   HGB 14.3 01/21/2022 1051   HCT 43.4 05/12/2022 0959   HCT 40.9 01/21/2022 1051   PLT 306 05/12/2022 0959   PLT 387 01/21/2022 1051   MCV 89.5 05/12/2022 0959   MCV 87 01/21/2022 1051   MCH 29.5 05/12/2022 0959   MCHC 32.9 05/12/2022 0959   RDW 12.7 05/12/2022 0959   RDW 12.9 01/21/2022 1051   Iron Studies No results found for: "IRON", "TIBC", "FERRITIN", "IRONPCTSAT" Lipid Panel     Component Value Date/Time   CHOL 237 (H) 11/04/2022 0814   TRIG 68 11/04/2022 0814   HDL 64 11/04/2022 0814   CHOLHDL 3.6 01/21/2022 1051   LDLCALC 161 (H) 11/04/2022  0814   Hepatic Function Panel     Component Value Date/Time   PROT 7.1 11/04/2022 0814   ALBUMIN 4.9 11/04/2022 0814   AST 25 11/04/2022 0814   ALT 39 (H) 11/04/2022 0814   ALKPHOS 127 (H) 11/04/2022 0814   BILITOT 0.2 11/04/2022 0814      Component Value Date/Time   TSH 1.200 11/04/2022 0814   Nutritional Lab Results  Component Value Date   VD25OH 21.8 (L) 11/04/2022     ASSESSMENT AND PLAN  TREATMENT PLAN FOR OBESITY:  Recommended Dietary Goals  Sarah Salas is currently in the action stage of change. As such, her goal is to continue weight management plan. She has agreed to the Category 2  Plan.  Behavioral Intervention  We discussed the following Behavioral Modification Strategies today: increasing lean protein intake, increasing vegetables, increasing lower sugar fruits, increase water intake, work on meal planning and easy cooking plans, reading food labels and making healthy choices when eating convenient foods, and avoiding temptations.  Additional resources provided today: NA  Recommended Physical Activity Goals  Sarah Salas has been advised to work up to 150 minutes of moderate intensity aerobic activity a week and strengthening exercises 2-3 times per week for cardiovascular health, weight loss maintenance and preservation of muscle mass.   She has agreed to increase physical activity in their day and reduce sedentary time (increase NEAT).    Pharmacotherapy We discussed various medication options to help Sarah Salas with her weight loss efforts and we both agreed to continue with nutritional and behavioral approaches.  ASSOCIATED CONDITIONS ADDRESSED TODAY  Pure hypercholesterolemia Assessment & Plan: LDL is not at goal. Recommended LDL goal is less <70 to reduce the future risk of fatty streaks and the progression to obstructive ASCVD.  Her 10 year risk is: The 10-year ASCVD risk score (Arnett DK, et al., 2019) is: 2.4%  Lab Results  Component Value Date   CHOL 237 (H) 11/04/2022   HDL 64 11/04/2022   LDLCALC 161 (H) 11/04/2022   TRIG 68 11/04/2022   CHOLHDL 3.6 01/21/2022    Continue with weight loss therapy patient working on reducing saturated fats in diet.  Hold off statin therapy.  Hold off statin therapy.  Repeat lipid panel in 3 to 4 months.      Vitamin D deficiency Assessment & Plan: Most recent vitamin D levels  Lab Results  Component Value Date   VD25OH 21.8 (L) 11/04/2022     Deficiency state associated with adiposity and may result in leptin resistance, weight gain and fatigue.   Plan: After discussion of benefits, side effects and treatment  options she is agreeable to taking ergocalciferol 50,000 units 1 tablet weekly for 12 weeks.  We will check vitamin D levels at that time for goal level of 50-60.  Orders: -     Ergocalciferol; Take 1 capsule (50,000 Units total) by mouth once a week.  Dispense: 12 capsule; Refill: 0  Elevated ALT measurement Assessment & Plan: Mildly elevated ALT and alkaline phosphatase.  We will do a first tier workup please refer to orders.  Denies alcohol consumption or risk factors for viral hepatitis.  We will consider ultrasound imaging of the liver at the next office visit.  If she has fatty liver, losing 15% of body weight will improve condition.  Orders: -     Gamma GT -     Hepatitis B core antibody, IgM -     Hepatitis B surface antibody,qualitative -     Hepatitis  B surface antigen -     Iron, TIBC and Ferritin Panel -     Hepatitis C antibody -     ALT+AST+TBili  Obesity, current BMI 38.2      Return in about 2 weeks (around 12/02/2022) for For Weight Mangement with Dr. Gerarda Fraction.Marland Kitchen She was informed of the importance of frequent follow up visits to maximize her success with intensive lifestyle modifications for her multiple health conditions.   ATTESTASTION STATEMENTS:  Reviewed by clinician on day of visit: allergies, medications, problem list, medical history, surgical history, family history, social history, and previous encounter notes.   Time spent on visit including pre-visit chart review and post-visit care and charting was 40 minutes.    Thomes Dinning, MD

## 2022-11-18 NOTE — Assessment & Plan Note (Signed)
Mildly elevated ALT and alkaline phosphatase.  We will do a first tier workup please refer to orders.  Denies alcohol consumption or risk factors for viral hepatitis.  We will consider ultrasound imaging of the liver at the next office visit.  If she has fatty liver, losing 15% of body weight will improve condition.

## 2022-11-18 NOTE — Assessment & Plan Note (Addendum)
LDL is not at goal. Recommended LDL goal is less <70 to reduce the future risk of fatty streaks and the progression to obstructive ASCVD.  Her 10 year risk is: The 10-year ASCVD risk score (Arnett DK, et al., 2019) is: 2.4%  Lab Results  Component Value Date   CHOL 237 (H) 11/04/2022   HDL 64 11/04/2022   LDLCALC 161 (H) 11/04/2022   TRIG 68 11/04/2022   CHOLHDL 3.6 01/21/2022    Continue with weight loss therapy patient working on reducing saturated fats in diet.  Hold off statin therapy.  Hold off statin therapy.  Repeat lipid panel in 3 to 4 months.

## 2022-11-18 NOTE — Assessment & Plan Note (Signed)
Most recent vitamin D levels  Lab Results  Component Value Date   VD25OH 21.8 (L) 11/04/2022     Deficiency state associated with adiposity and may result in leptin resistance, weight gain and fatigue.   Plan: After discussion of benefits, side effects and treatment options she is agreeable to taking ergocalciferol 50,000 units 1 tablet weekly for 12 weeks.  We will check vitamin D levels at that time for goal level of 50-60.

## 2022-11-19 LAB — IRON,TIBC AND FERRITIN PANEL
Ferritin: 62 ng/mL (ref 15–150)
Iron Saturation: 21 % (ref 15–55)
Iron: 68 ug/dL (ref 27–159)
Total Iron Binding Capacity: 329 ug/dL (ref 250–450)
UIBC: 261 ug/dL (ref 131–425)

## 2022-11-19 LAB — HEPATITIS C ANTIBODY: Hep C Virus Ab: NONREACTIVE

## 2022-11-19 LAB — HEPATITIS B CORE ANTIBODY, IGM: Hep B C IgM: NEGATIVE

## 2022-11-19 LAB — HEPATITIS B SURFACE ANTIBODY,QUALITATIVE: Hep B Surface Ab, Qual: NONREACTIVE

## 2022-11-19 LAB — ALT+AST+TBILI
ALT: 292 IU/L — ABNORMAL HIGH (ref 0–32)
AST: 67 IU/L — ABNORMAL HIGH (ref 0–40)
Bilirubin Total: 0.2 mg/dL (ref 0.0–1.2)

## 2022-11-19 LAB — GAMMA GT: GGT: 32 IU/L (ref 0–60)

## 2022-11-19 LAB — HEPATITIS B SURFACE ANTIGEN: Hepatitis B Surface Ag: NEGATIVE

## 2022-11-22 ENCOUNTER — Other Ambulatory Visit: Payer: Self-pay | Admitting: Nurse Practitioner

## 2022-11-22 DIAGNOSIS — R7989 Other specified abnormal findings of blood chemistry: Secondary | ICD-10-CM

## 2022-11-23 DIAGNOSIS — F411 Generalized anxiety disorder: Secondary | ICD-10-CM | POA: Diagnosis not present

## 2022-11-23 DIAGNOSIS — F902 Attention-deficit hyperactivity disorder, combined type: Secondary | ICD-10-CM | POA: Diagnosis not present

## 2022-11-23 DIAGNOSIS — F3181 Bipolar II disorder: Secondary | ICD-10-CM | POA: Insufficient documentation

## 2022-12-01 ENCOUNTER — Telehealth (HOSPITAL_COMMUNITY): Payer: Self-pay | Admitting: *Deleted

## 2022-12-01 NOTE — Telephone Encounter (Signed)
Pt called stating that she will be seeing [facility] and has had some lab tests from PCP Geryl Rankins) indication fatty liver. Primary has recommended that Dr. Adele Schilder d/c the Strattera and replace with another medication that will not effect the fatty liver. Pt next appointment in this clinic is scheduled for 01/20/23. Please review and advise.

## 2022-12-01 NOTE — Telephone Encounter (Signed)
She need to discontinue Strattera and wait until her liver enzymes come back to normal before we could start any other medication.

## 2022-12-02 ENCOUNTER — Other Ambulatory Visit (HOSPITAL_COMMUNITY): Payer: Self-pay | Admitting: Psychiatry

## 2022-12-02 DIAGNOSIS — F319 Bipolar disorder, unspecified: Secondary | ICD-10-CM

## 2022-12-03 NOTE — Telephone Encounter (Signed)
Pt verbalizes understanding and will keep [place] updated.

## 2022-12-06 ENCOUNTER — Encounter: Payer: Self-pay | Admitting: Nurse Practitioner

## 2022-12-07 ENCOUNTER — Ambulatory Visit (INDEPENDENT_AMBULATORY_CARE_PROVIDER_SITE_OTHER): Payer: Medicaid Other | Admitting: Internal Medicine

## 2022-12-07 ENCOUNTER — Telehealth (HOSPITAL_COMMUNITY): Payer: Self-pay | Admitting: *Deleted

## 2022-12-07 ENCOUNTER — Encounter (INDEPENDENT_AMBULATORY_CARE_PROVIDER_SITE_OTHER): Payer: Self-pay | Admitting: Internal Medicine

## 2022-12-07 VITALS — BP 129/84 | HR 77 | Temp 97.8°F | Ht 69.0 in | Wt 259.0 lb

## 2022-12-07 DIAGNOSIS — E669 Obesity, unspecified: Secondary | ICD-10-CM | POA: Diagnosis not present

## 2022-12-07 DIAGNOSIS — E7800 Pure hypercholesterolemia, unspecified: Secondary | ICD-10-CM

## 2022-12-07 DIAGNOSIS — R7989 Other specified abnormal findings of blood chemistry: Secondary | ICD-10-CM

## 2022-12-07 DIAGNOSIS — E88819 Insulin resistance, unspecified: Secondary | ICD-10-CM | POA: Insufficient documentation

## 2022-12-07 DIAGNOSIS — E559 Vitamin D deficiency, unspecified: Secondary | ICD-10-CM

## 2022-12-07 DIAGNOSIS — Z6838 Body mass index (BMI) 38.0-38.9, adult: Secondary | ICD-10-CM | POA: Diagnosis not present

## 2022-12-07 NOTE — Assessment & Plan Note (Signed)
Most recent vitamin D levels  Lab Results  Component Value Date   VD25OH 21.8 (L) 11/04/2022     Deficiency state associated with adiposity and may result in leptin resistance, weight gain and fatigue. Currently on vitamin D supplementation without any adverse effects.  Plan: Continue high-dose vitamin D supplementation.  Repeat vitamin D levels in April for goal of 50-60.

## 2022-12-07 NOTE — Assessment & Plan Note (Signed)
Her HOMA-IR is 3.15 which is elevated. Optimal level < 1.9. This is complex condition associated with genetics, ectopic fat and lifestyle factors. Insulin resistance may result in weight gain, abnormal cravings (particularly for carbs) and fatigue. This may result in additional weight gain and lead to pre-diabetes and diabetes if untreated.   Lab Results  Component Value Date   HGBA1C 5.6 11/04/2022   Lab Results  Component Value Date   INSULIN 13.6 11/04/2022   Lab Results  Component Value Date   GLUCOSE 94 11/04/2022   GLUCOSE 87 11/19/2017    We reviewed treatment options which include losing 7 to 10% of body weight, increasing physical activity to a 150 minutes a week of moderate intensity.She may also be a candidate for pharmacoprophylaxis with metformin or incretin mimetic.

## 2022-12-07 NOTE — Progress Notes (Signed)
Office: [phone]  /  Fax: [phone]  WEIGHT SUMMARY AND BIOMETRICS  Vitals Temp: 97.8 F (36.6 C) BP: 129/84 Pulse Rate: 77 SpO2: 98 %   Anthropometric Measurements Height: '5\' 9"'$  (1.753 m) Weight: 259 lb (117.5 kg) BMI (Calculated): 38.23 Weight at Last Visit: 265 lb Weight Lost Since Last Visit: 6 lb Starting Weight: 268 lb Total Weight Loss (lbs): 9 lb (4.082 kg) Peak Weight: 450 lb   Body Composition  Body Fat %: 47.6 % Fat Mass (lbs): 123.4 lbs Muscle Mass (lbs): 129 lbs Total Body Water (lbs): 94.2 lbs Visceral Fat Rating : 13   Other Clinical Data Fasting: No Labs: No Today's Visit #: 4 Starting Date: 08/27/22     HPI  Chief Complaint: OBESITY  Sarah Salas is here to discuss her progress with her obesity treatment plan. She is on the the Category 2 Plan and states she is following her eating plan approximately 95 % of the time. She states she is exercising 45-60 minutes 5 times per week.   Interval History:  Since last office visit she has has lost 6 pounds. Since last office she reports excellent. to prescribed reduced calorie nutrition plan. Has been working on eating more fruits and vegetables, having a balanced plate and has eliminated sugary drinks.  She may drink a diet soda once a day.  She has also been increasing physical activity. Denies problems with appetite and hunger signals.  Denies problems with satiety and satiation.  Denies problems with eating patterns and portion control.  Sleeping approximately 6 hours a day.  Sleep described as interrupted.  Stress levels are reported as high and due to Family being caregiver .  Barriers identified lack of time for self-care.    Pharmacotherapy for weight loss: She is currently taking none. Denies adverse effects.    ASSESSMENT AND PLAN  TREATMENT PLAN FOR OBESITY:  Recommended Dietary Goals  Sarah Salas is currently in the action stage of change. As such, her goal is to continue weight  management plan. She has agreed to the Category 2 Plan.  Behavioral Intervention  We discussed the following Behavioral Modification Strategies today: increasing lean protein intake, increasing vegetables, increasing water intake, work on meal planning and easy cooking plans, work on managing stress, creating time for self-care and relaxation measures, and avoiding temptations and identifying enticing environmental cues.  Additional resources provided today: NA  Recommended Physical Activity Goals  Sarah Salas has been advised to work up to 150 minutes of moderate intensity aerobic activity a week and strengthening exercises 2-3 times per week for cardiovascular health, weight loss maintenance and preservation of muscle mass.   She has agreed to continue physical activity as is.    Pharmacotherapy We discussed various medication options to help Sarah Salas with her weight loss efforts and we both agreed to continue with nutritional and behavioral strategies .  ASSOCIATED CONDITIONS ADDRESSED TODAY  Pure hypercholesterolemia Assessment & Plan: LDL is not at goal. Recommended LDL goal is less <70 to reduce the future risk of fatty streaks and the progression to obstructive ASCVD.  Her 10 year risk is: The 10-year ASCVD risk score (Arnett DK, et al., 2019) is: 0.8%  Lab Results  Component Value Date   CHOL 237 (H) 11/04/2022   HDL 64 11/04/2022   LDLCALC 161 (H) 11/04/2022   TRIG 68 11/04/2022   CHOLHDL 3.6 01/21/2022    Continue with nutritional strategies patient working on reducing saturated fats in diet.  Hold off statin  Repeat lipid  panel in 3 months if LDL still elevated we will recommend statin therapy.      Vitamin D deficiency Assessment & Plan: Most recent vitamin D levels  Lab Results  Component Value Date   VD25OH 21.8 (L) 11/04/2022     Deficiency state associated with adiposity and may result in leptin resistance, weight gain and fatigue. Currently on vitamin D  supplementation without any adverse effects.  Plan: Continue high-dose vitamin D supplementation.  Repeat vitamin D levels in April for goal of 50-60.    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  She is currently asymptomatic.  Negative iron studies and hepatitis serologies.  I have also corresponded with the primary care team and her psychiatrist.  She has stopped gabapentin and also Strattera as recommended by psychiatry.  I will check liver enzymes today.  Orders: -     CMP14+EGFR  Obesity, current BMI 38.2  Insulin resistance Assessment & Plan: Her HOMA-IR is 3.15 which is elevated. Optimal level < 1.9. This is complex condition associated with genetics, ectopic fat and lifestyle factors. Insulin resistance may result in weight gain, abnormal cravings (particularly for carbs) and fatigue. This may result in additional weight gain and lead to pre-diabetes and diabetes if untreated.   Lab Results  Component Value Date   HGBA1C 5.6 11/04/2022   Lab Results  Component Value Date   INSULIN 13.6 11/04/2022   Lab Results  Component Value Date   GLUCOSE 94 11/04/2022   GLUCOSE 87 11/19/2017    We reviewed treatment options which include losing 7 to 10% of body weight, increasing physical activity to a 150 minutes a week of moderate intensity.She may also be a candidate for pharmacoprophylaxis with metformin or incretin mimetic.        PHYSICAL EXAM:  Blood pressure 129/84, pulse 77, temperature 97.8 F (36.6 C), height '5\' 9"'$  (1.753 m), weight 259 lb (117.5 kg), SpO2 98 %. Body mass index is 38.25 kg/m.  General: She is overweight, cooperative, alert, well developed, and in no acute distress. PSYCH: Has normal mood, affect and thought process.   HEENT: EOMI, sclerae are anicteric. Lungs: Normal breathing effort, no conversational dyspnea. Extremities: No edema.  Neurologic: No gross sensory or motor deficits. No tremors or fasciculations noted.     DIAGNOSTIC DATA REVIEWED:  BMET    Component Value Date/Time   NA 144 11/04/2022 0814   K 4.9 11/04/2022 0814   CL 106 11/04/2022 0814   CO2 22 11/04/2022 0814   GLUCOSE 94 11/04/2022 0814   GLUCOSE 125 (H) 05/20/2022 0155   BUN 14 11/04/2022 0814   CREATININE 0.75 11/04/2022 0814   CALCIUM 10.2 11/04/2022 0814   GFRNONAA >60 05/20/2022 0155   GFRAA 101 11/25/2020 0919   Lab Results  Component Value Date   HGBA1C 5.6 11/04/2022   HGBA1C 5.3 12/26/2019   Lab Results  Component Value Date   INSULIN 13.6 11/04/2022   Lab Results  Component Value Date   TSH 1.200 11/04/2022   CBC    Component Value Date/Time   WBC 6.7 05/12/2022 0959   RBC 4.85 05/12/2022 0959   HGB 14.3 05/12/2022 0959   HGB 14.3 01/21/2022 1051   HCT 43.4 05/12/2022 0959   HCT 40.9 01/21/2022 1051   PLT 306 05/12/2022 0959   PLT 387 01/21/2022 1051   MCV 89.5 05/12/2022 0959   MCV 87 01/21/2022 1051   MCH 29.5 05/12/2022 0959   MCHC 32.9 05/12/2022 0959  RDW 12.7 05/12/2022 0959   RDW 12.9 01/21/2022 1051   Iron Studies    Component Value Date/Time   IRON 68 11/18/2022 1117   TIBC 329 11/18/2022 1117   FERRITIN 62 11/18/2022 1117   IRONPCTSAT 21 11/18/2022 1117   Lipid Panel     Component Value Date/Time   CHOL 237 (H) 11/04/2022 0814   TRIG 68 11/04/2022 0814   HDL 64 11/04/2022 0814   CHOLHDL 3.6 01/21/2022 1051   LDLCALC 161 (H) 11/04/2022 0814   Hepatic Function Panel     Component Value Date/Time   PROT 7.1 11/04/2022 0814   ALBUMIN 4.9 11/04/2022 0814   AST 67 (H) 11/18/2022 1117   ALT 292 (H) 11/18/2022 1117   ALKPHOS 127 (H) 11/04/2022 0814   BILITOT 0.2 11/18/2022 1117      Component Value Date/Time   TSH 1.200 11/04/2022 0814   Nutritional Lab Results  Component Value Date   VD25OH 21.8 (L) 11/04/2022     Return in about 2 weeks (around 12/21/2022) for For Weight Mangement with Dr. Gerarda Fraction.Marland Kitchen She was informed of the importance of frequent follow up  visits to maximize her success with intensive lifestyle modifications for her multiple health conditions.    ATTESTASTION STATEMENTS:  Reviewed by clinician on day of visit: allergies, medications, problem list, medical history, surgical history, family history, social history, and previous encounter notes.   Time spent on visit including pre-visit chart review and post-visit care and charting was 30 minutes.    Thomes Dinning, MD

## 2022-12-07 NOTE — Assessment & Plan Note (Addendum)
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  She is currently asymptomatic.  Negative iron studies and hepatitis serologies.  I have also corresponded with the primary care team and her psychiatrist.  She has stopped gabapentin and also Strattera as recommended by psychiatry.  I will check liver enzymes today.

## 2022-12-07 NOTE — Assessment & Plan Note (Signed)
LDL is not at goal. Recommended LDL goal is less <70 to reduce the future risk of fatty streaks and the progression to obstructive ASCVD.  Her 10 year risk is: The 10-year ASCVD risk score (Arnett DK, et al., 2019) is: 0.8%  Lab Results  Component Value Date   CHOL 237 (H) 11/04/2022   HDL 64 11/04/2022   LDLCALC 161 (H) 11/04/2022   TRIG 68 11/04/2022   CHOLHDL 3.6 01/21/2022    Continue with nutritional strategies patient working on reducing saturated fats in diet.  Hold off statin  Repeat lipid panel in 3 months if LDL still elevated we will recommend statin therapy.

## 2022-12-07 NOTE — Telephone Encounter (Signed)
Pt called to advise that she was advised by her weight loss Doc that 3 of the medications she is prescribed by this office cause weight gain. So she would like to talk to you about changing/possibly getting off some meds. Pt had labs today so hopefully LFT's are coming down. Writer advised pt that provider is out this week but that I would send a message for him to see upon his return to office next week. Pt verbalizes understanding and was appreciative.

## 2022-12-08 ENCOUNTER — Encounter (INDEPENDENT_AMBULATORY_CARE_PROVIDER_SITE_OTHER): Payer: Self-pay | Admitting: Internal Medicine

## 2022-12-08 LAB — CMP14+EGFR
ALT: 42 IU/L — ABNORMAL HIGH (ref 0–32)
AST: 20 IU/L (ref 0–40)
Albumin/Globulin Ratio: 2.2 (ref 1.2–2.2)
Albumin: 4.8 g/dL (ref 3.9–4.9)
Alkaline Phosphatase: 146 IU/L — ABNORMAL HIGH (ref 44–121)
BUN/Creatinine Ratio: 10 (ref 9–23)
BUN: 8 mg/dL (ref 6–24)
Bilirubin Total: 0.2 mg/dL (ref 0.0–1.2)
CO2: 26 mmol/L (ref 20–29)
Calcium: 9.6 mg/dL (ref 8.7–10.2)
Chloride: 106 mmol/L (ref 96–106)
Creatinine, Ser: 0.82 mg/dL (ref 0.57–1.00)
Globulin, Total: 2.2 g/dL (ref 1.5–4.5)
Glucose: 100 mg/dL — ABNORMAL HIGH (ref 70–99)
Potassium: 4.2 mmol/L (ref 3.5–5.2)
Sodium: 148 mmol/L — ABNORMAL HIGH (ref 134–144)
Total Protein: 7 g/dL (ref 6.0–8.5)
eGFR: 90 mL/min/{1.73_m2} (ref >59)

## 2022-12-12 ENCOUNTER — Other Ambulatory Visit: Payer: Self-pay | Admitting: Nurse Practitioner

## 2022-12-13 MED ORDER — LATANOPROST 0.005 % OP SOLN: 1.0000 [drp] | Freq: Every day | OPHTHALMIC | 6 refills | Status: DC

## 2022-12-22 ENCOUNTER — Encounter (HOSPITAL_BASED_OUTPATIENT_CLINIC_OR_DEPARTMENT_OTHER): Payer: Self-pay | Admitting: Orthopaedic Surgery

## 2022-12-23 ENCOUNTER — Telehealth (INDEPENDENT_AMBULATORY_CARE_PROVIDER_SITE_OTHER): Payer: Self-pay

## 2022-12-23 ENCOUNTER — Ambulatory Visit (INDEPENDENT_AMBULATORY_CARE_PROVIDER_SITE_OTHER): Payer: Medicaid Other | Admitting: Internal Medicine

## 2022-12-23 ENCOUNTER — Encounter (INDEPENDENT_AMBULATORY_CARE_PROVIDER_SITE_OTHER): Payer: Self-pay | Admitting: Internal Medicine

## 2022-12-23 VITALS — BP 119/83 | HR 82 | Temp 98.0°F | Ht 69.0 in | Wt 253.0 lb

## 2022-12-23 DIAGNOSIS — Z6837 Body mass index (BMI) 37.0-37.9, adult: Secondary | ICD-10-CM | POA: Diagnosis not present

## 2022-12-23 DIAGNOSIS — E669 Obesity, unspecified: Secondary | ICD-10-CM

## 2022-12-23 DIAGNOSIS — E559 Vitamin D deficiency, unspecified: Secondary | ICD-10-CM | POA: Diagnosis not present

## 2022-12-23 DIAGNOSIS — R7989 Other specified abnormal findings of blood chemistry: Secondary | ICD-10-CM

## 2022-12-23 DIAGNOSIS — E88819 Insulin resistance, unspecified: Secondary | ICD-10-CM

## 2022-12-23 NOTE — Progress Notes (Signed)
Office: [phone]  /  Fax: [phone]  WEIGHT SUMMARY AND BIOMETRICS  Vitals Temp: 98 F (36.7 C) BP: 119/83 Pulse Rate: 82 SpO2: 99 %   Anthropometric Measurements Height: '5\' 9"'$  (1.753 m) Weight: 253 lb (114.8 kg) BMI (Calculated): 37.34 Weight at Last Visit: 259 lb Weight Lost Since Last Visit: 6 lb Starting Weight: 268 lb Total Weight Loss (lbs): 15 lb (6.804 kg) Peak Weight: 450 lb   Body Composition  Body Fat %: 45.6 % Fat Mass (lbs): 115.4 lbs Muscle Mass (lbs): 130.6 lbs Total Body Water (lbs): 89.2 lbs Visceral Fat Rating : 12    HPI  Chief Complaint: OBESITY  Sarah Salas is here to discuss her progress with her obesity treatment plan. She is on the the Category 2 Plan and states she is following her eating plan approximately 80 % of the time. She states she is exercising 60 minutes 5 times per week.  Interval History:  Since last office visit she has lost 6 lbs. She reports good adherence to reduced calorie nutritional plan. She has been working on eating more protein  '[x]'$ Denies '[]'$ Reports problems with appetite and hunger signals.  '[x]'$ Denies '[]'$ Reports problems with satiety and satiation.  '[x]'$ Denies '[]'$ Reports problems with eating patterns and portion control.  '[x]'$ Denies '[]'$ Reports abnormal cravings Sleeping approximately 6 hours a day.  Sleep described as: '[x]'$ Restorative '[]'$ Unrestorative '[]'$ Interrupted Stress levels are reported as medium and due to Family External.  Barriers identified none.   Pharmacotherapy for weight loss: She is currently taking no anti-obesity medication.   Weight promoting medications identified: Psychotropic medications.  ASSESSMENT AND PLAN  TREATMENT PLAN FOR OBESITY:  Recommended Dietary Goals  Sarah Salas is currently in the action stage of change. As such, her goal is to continue weight management plan. She has agreed to: the Category 2 Plan.  Behavioral Intervention  We discussed the following Behavioral Modification  Strategies today: increasing water intake, work on meal planning and easy cooking plans, work on tracking and journaling calories using tracking App, and reading food labels .  Additional resources provided today: None  Recommended Physical Activity Goals  Sarah Salas has been advised to work up to 150 minutes of moderate intensity aerobic activity a week and strengthening exercises 2-3 times per week for cardiovascular health, weight loss maintenance and preservation of muscle mass.   She has agreed to : Enroll in [employer] '[x]'$  Continue current level of physical activity  '[]'$  Think about ways to increase physical activity '[x]'$  Start strengthening exercises with a goal of 2-3 sessions a week  '[]'$  Start aerobic activity with a goal of 150 minutes a week at moderate intensity.  '[]'$  Increase the intensity, frequency or duration of strengthening exercises  '[]'$  Increase the intensity, frequency or duration of aerobic exercises   '[]'$  Increase physical activity in their day and reduce sedentary time (increase NEAT). '[]'$  Work on scheduling and tracking physical activity.   Pharmacotherapy We discussed various medication options to help Sarah Salas with her weight loss efforts and we both agreed to : continue with nutritional and behavioral strategies  ASSOCIATED CONDITIONS ADDRESSED TODAY  Vitamin D deficiency Assessment & Plan: Most recent vitamin D levels  Lab Results  Component Value Date   VD25OH 21.8 (L) 11/04/2022     Deficiency state associated with adiposity and may result in leptin resistance, weight gain and fatigue. Currently on vitamin D supplementation without any adverse effects.  Plan: Continue high-dose vitamin D supplementation.  Repeat vitamin D levels in April for goal of 50-60.  Insulin resistance Assessment & Plan: Her HOMA-IR is 3.15 which is elevated. Optimal level < 1.9. This is complex condition associated with genetics, ectopic fat and lifestyle factors. Insulin  resistance may result in weight gain, abnormal cravings (particularly for carbs) and fatigue. This may result in additional weight gain and lead to pre-diabetes and diabetes if untreated.   Lab Results  Component Value Date   HGBA1C 5.6 11/04/2022   Lab Results  Component Value Date   INSULIN 13.6 11/04/2022   Lab Results  Component Value Date   GLUCOSE 100 (H) 12/07/2022   GLUCOSE 87 11/19/2017    Continue with nutritional and behavioral strategies.  She has lost 15 pounds thus far and has eliminated simple and added sugars from her diet.  She may also be a candidate for incretin therapy and we will consider metformin to offset some of the weight gain associated with psychotropic medication.    Obesity, current BMI 37  Elevated LFTs Assessment & Plan: Improved following discontinuation of some of her psychotropic medication.  She will be following up with psychiatry.  She remains off Strattera.  Follow-up on liver enzymes at the next visit, if they remain elevated recommend additional workup for hepatic steatosis.      PHYSICAL EXAM:  Blood pressure 119/83, pulse 82, temperature 98 F (36.7 C), height '5\' 9"'$  (1.753 m), weight 253 lb (114.8 kg), SpO2 99 %. Body mass index is 37.36 kg/m.  General: She is overweight, cooperative, alert, well developed, and in no acute distress. PSYCH: Has normal mood, affect and thought process.   HEENT: EOMI, sclerae are anicteric. Lungs: Normal breathing effort, no conversational dyspnea. Extremities: No edema.  Neurologic: No gross sensory or motor deficits. No tremors or fasciculations noted.    DIAGNOSTIC DATA REVIEWED:  BMET    Component Value Date/Time   NA 148 (H) 12/07/2022 1252   K 4.2 12/07/2022 1252   CL 106 12/07/2022 1252   CO2 26 12/07/2022 1252   GLUCOSE 100 (H) 12/07/2022 1252   GLUCOSE 125 (H) 05/20/2022 0155   BUN 8 12/07/2022 1252   CREATININE 0.82 12/07/2022 1252   CALCIUM 9.6 12/07/2022 1252   GFRNONAA >60  05/20/2022 0155   GFRAA 101 11/25/2020 0919   Lab Results  Component Value Date   HGBA1C 5.6 11/04/2022   HGBA1C 5.3 12/26/2019   Lab Results  Component Value Date   INSULIN 13.6 11/04/2022   Lab Results  Component Value Date   TSH 1.200 11/04/2022   CBC    Component Value Date/Time   WBC 6.7 05/12/2022 0959   RBC 4.85 05/12/2022 0959   HGB 14.3 05/12/2022 0959   HGB 14.3 01/21/2022 1051   HCT 43.4 05/12/2022 0959   HCT 40.9 01/21/2022 1051   PLT 306 05/12/2022 0959   PLT 387 01/21/2022 1051   MCV 89.5 05/12/2022 0959   MCV 87 01/21/2022 1051   MCH 29.5 05/12/2022 0959   MCHC 32.9 05/12/2022 0959   RDW 12.7 05/12/2022 0959   RDW 12.9 01/21/2022 1051   Iron Studies    Component Value Date/Time   IRON 68 11/18/2022 1117   TIBC 329 11/18/2022 1117   FERRITIN 62 11/18/2022 1117   IRONPCTSAT 21 11/18/2022 1117   Lipid Panel     Component Value Date/Time   CHOL 237 (H) 11/04/2022 0814   TRIG 68 11/04/2022 0814   HDL 64 11/04/2022 0814   CHOLHDL 3.6 01/21/2022 1051   LDLCALC 161 (H) 11/04/2022 0814   Hepatic  Function Panel     Component Value Date/Time   PROT 7.0 12/07/2022 1252   ALBUMIN 4.8 12/07/2022 1252   AST 20 12/07/2022 1252   ALT 42 (H) 12/07/2022 1252   ALKPHOS 146 (H) 12/07/2022 1252   BILITOT 0.2 12/07/2022 1252      Component Value Date/Time   TSH 1.200 11/04/2022 [id number]   Nutritional Lab Results  Component Value Date   VD25OH 21.8 (L) 11/04/2022     Return in about 3 weeks (around 01/13/2023) for For Weight Mangement with Dr. Gerarda Salas.Marland Kitchen She was informed of the importance of frequent follow up visits to maximize her success with intensive lifestyle modifications for her multiple health conditions.   ATTESTASTION STATEMENTS:  Reviewed by clinician on day of visit: allergies, medications, problem list, medical history, surgical history, family history, social history, and previous encounter notes.    Thomes Dinning, MD

## 2022-12-23 NOTE — Telephone Encounter (Signed)
Referral sent to [facility], faxed and received.

## 2022-12-23 NOTE — Assessment & Plan Note (Addendum)
Improved following discontinuation of some of her psychotropic medication.  She will be following up with psychiatry.  She remains off Strattera.  Follow-up on liver enzymes at the next visit, if they remain elevated recommend additional workup for hepatic steatosis.

## 2022-12-23 NOTE — Assessment & Plan Note (Signed)
Most recent vitamin D levels  Lab Results  Component Value Date   VD25OH 21.8 (L) 11/04/2022     Deficiency state associated with adiposity and may result in leptin resistance, weight gain and fatigue. Currently on vitamin D supplementation without any adverse effects.  Plan: Continue high-dose vitamin D supplementation.  Repeat vitamin D levels in April for goal of 50-60.  

## 2022-12-23 NOTE — Assessment & Plan Note (Signed)
Her HOMA-IR is 3.15 which is elevated. Optimal level < 1.9. This is complex condition associated with genetics, ectopic fat and lifestyle factors. Insulin resistance may result in weight gain, abnormal cravings (particularly for carbs) and fatigue. This may result in additional weight gain and lead to pre-diabetes and diabetes if untreated.   Lab Results  Component Value Date   HGBA1C 5.6 11/04/2022   Lab Results  Component Value Date   INSULIN 13.6 11/04/2022   Lab Results  Component Value Date   GLUCOSE 100 (H) 12/07/2022   GLUCOSE 87 11/19/2017    Continue with nutritional and behavioral strategies.  She has lost 15 pounds thus far and has eliminated simple and added sugars from her diet.  She may also be a candidate for incretin therapy and we will consider metformin to offset some of the weight gain associated with psychotropic medication.

## 2023-01-04 ENCOUNTER — Ambulatory Visit
Admission: RE | Admit: 2023-01-04 | Discharge: 2023-01-04 | Disposition: A | Discharge status: Home / Self Care | Payer: Medicaid Other | Source: Ambulatory Visit | Attending: Nurse Practitioner | Admitting: Nurse Practitioner

## 2023-01-04 DIAGNOSIS — Z1231 Encounter for screening mammogram for malignant neoplasm of breast: Secondary | ICD-10-CM

## 2023-01-09 ENCOUNTER — Other Ambulatory Visit (HOSPITAL_COMMUNITY): Payer: Self-pay | Admitting: Psychiatry

## 2023-01-09 DIAGNOSIS — F319 Bipolar disorder, unspecified: Secondary | ICD-10-CM

## 2023-01-13 ENCOUNTER — Ambulatory Visit: Payer: Medicaid Other | Attending: Nurse Practitioner | Admitting: Nurse Practitioner

## 2023-01-13 ENCOUNTER — Encounter: Payer: Self-pay | Admitting: Nurse Practitioner

## 2023-01-13 VITALS — BP 102/69 | Ht 69.0 in | Wt 261.4 lb

## 2023-01-13 DIAGNOSIS — Z1211 Encounter for screening for malignant neoplasm of colon: Secondary | ICD-10-CM | POA: Diagnosis not present

## 2023-01-13 DIAGNOSIS — R232 Flushing: Secondary | ICD-10-CM | POA: Diagnosis not present

## 2023-01-13 DIAGNOSIS — Z0000 Encounter for general adult medical examination without abnormal findings: Secondary | ICD-10-CM

## 2023-01-13 DIAGNOSIS — E7800 Pure hypercholesterolemia, unspecified: Secondary | ICD-10-CM | POA: Diagnosis not present

## 2023-01-13 DIAGNOSIS — Z30432 Encounter for removal of intrauterine contraceptive device: Secondary | ICD-10-CM | POA: Diagnosis not present

## 2023-01-13 MED ORDER — GABAPENTIN 100 MG PO CAPS: 100.0000 mg | ORAL_CAPSULE | Freq: Two times a day (BID) | ORAL | 1 refills | Status: DC

## 2023-01-13 NOTE — Progress Notes (Signed)
5'9 Hot flashes

## 2023-01-13 NOTE — Progress Notes (Signed)
Assessment & Plan:  [name] was seen today for annual exam.  Diagnoses and all orders for this visit:  Encounter for annual physical exam -     CMP14+EGFR -     CBC with Differential -     Lipid panel -     Thyroid Panel With TSH  Colon cancer screening -     Ambulatory referral to Gastroenterology  Hot flashes -     gabapentin (NEURONTIN) 100 MG capsule; Take 1 capsule (100 mg total) by mouth 2 (two) times daily. Hot flashes -     CMP14+EGFR -     CBC with Differential -     Thyroid Panel With TSH  Encounter for IUD removal -     Ambulatory referral to Gynecology  Hypercholesterolemia -     Lipid panel    Patient has been counseled on age-appropriate routine health concerns for screening and prevention. These are reviewed and up-to-date. Referrals have been placed accordingly. Immunizations are up-to-date or declined.    Subjective:   Chief Complaint  Patient presents with   Annual Exam   HPI Sarah Salas 47 y.o. female presents to office today for annual exam.  She endorses daily hot flashes occurring more frequently. Interested in therapy for this  GU Needs referral to GYN for IUD removal.    Review of Systems  Constitutional:  Negative for fever, malaise/fatigue and weight loss.       HOT FLASHES  HENT: Negative.  Negative for nosebleeds.   Eyes: Negative.  Negative for blurred vision, double vision and photophobia.  Respiratory: Negative.  Negative for cough and shortness of breath.   Cardiovascular: Negative.  Negative for chest pain, palpitations and leg swelling.  Gastrointestinal: Negative.  Negative for heartburn, nausea and vomiting.  Genitourinary: Negative.   Musculoskeletal: Negative.  Negative for myalgias.  Skin: Negative.   Neurological: Negative.  Negative for dizziness, focal weakness, seizures and headaches.  Endo/Heme/Allergies: Negative.   Psychiatric/Behavioral: Negative.  Negative for suicidal ideas.     Past Medical History:   Diagnosis Date   ADHD (attention deficit hyperactivity disorder)    Anxiety    Arthritis    Bipolar 1 disorder    Brain injury 2005   Glaucoma    Hypertension    no longer treated    Past Surgical History:  Procedure Laterality Date   BRAIN SURGERY  10/28/2003   from [facility]   KNEE ARTHROSCOPY WITH MENISCAL REPAIR Right 05/19/2022   Procedure: RIGHT KNEE ARTHROSCOPY, MEDIAL MENISCAL DEBRIDEMENT AND ABRASION ARTHROPLASTY;  Surgeon: Vanetta Mulders, MD;  Location: [facility];  Service: Orthopedics;  Laterality: Right;   TIBIA OSTEOTOMY Right 05/19/2022   Procedure: RIGHT KNEE HIGH TIBIAL OSTEOTOMY;  Surgeon: Vanetta Mulders, MD;  Location: [facility];  Service: Orthopedics;  Laterality: Right;   WISDOM TOOTH EXTRACTION      Family History  Problem Relation Age of Onset   Bipolar disorder Mother    Hypertension Mother    71 / [place] Mother    Vision loss Mother    Varicose Veins Mother    High Cholesterol Mother    Anxiety disorder Mother    Obesity Mother    Alcohol abuse Father    Heart disease Father    Learning disabilities Father    High blood pressure Father    High Cholesterol Father    Sudden death Father    Obesity Father    Bipolar disorder Sister    Learning disabilities Sister  Bipolar disorder Maternal Grandmother    Hypertension Maternal Grandfather    Cancer Paternal Grandmother    Cancer Paternal Grandfather    Diabetes Paternal Aunt     Social History Reviewed with no changes to be made today.   Outpatient Medications Prior to Visit  Medication Sig Dispense Refill   ARIPiprazole (ABILIFY) 5 MG tablet Take 1 tablet by mouth once daily 90 tablet 0   citalopram (CELEXA) 20 MG tablet Take 1 tablet (20 mg total) by mouth daily. 90 tablet 0   clonazePAM (KLONOPIN) 0.5 MG tablet Take 1 tablet (0.5 mg total) by mouth 3 (three) times daily as needed for anxiety. 90 tablet 2   ergocalciferol (VITAMIN D2) 1.25 MG (50000 UT) capsule Take 1 capsule (50,000  Units total) by mouth once a week. 12 capsule 0   lamoTRIgine (LAMICTAL) 200 MG tablet Take 1 tablet (200 mg total) by mouth daily. 90 tablet 0   latanoprost (XALATAN) 0.005 % ophthalmic solution Place 1 drop into both eyes at bedtime. 2.5 mL 6   No facility-administered medications prior to visit.    No Known Allergies     Objective:    BP 102/69   Ht 5\' 9"  (1.753 m)   Wt 261 lb 6.4 oz (118.6 kg)   LMP  (LMP Unknown)   BMI 38.60 kg/m  Wt Readings from Last 3 Encounters:  01/13/23 261 lb 6.4 oz (118.6 kg)  12/23/22 253 lb (114.8 kg)  12/07/22 259 lb (117.5 kg)    Physical Exam Constitutional:      Appearance: She is well-developed.  HENT:     Head: Normocephalic and atraumatic.     Right Ear: Hearing, ear canal and external ear normal. There is impacted cerumen.     Left Ear: Hearing, ear canal and external ear normal. There is impacted cerumen.     Nose: Nose normal.     Right Turbinates: Not enlarged.     Left Turbinates: Not enlarged.     Mouth/Throat:     Lips: Pink.     Mouth: Mucous membranes are moist.     Dentition: No dental tenderness, gingival swelling, dental abscesses or gum lesions.     Pharynx: No oropharyngeal exudate.  Eyes:     General: No scleral icterus.       Right eye: No discharge.     Extraocular Movements: Extraocular movements intact.     Conjunctiva/sclera: Conjunctivae normal.     Pupils: Pupils are equal, round, and reactive to light.  Neck:     Thyroid: No thyromegaly.     Trachea: No tracheal deviation.  Cardiovascular:     Rate and Rhythm: Normal rate and regular rhythm.     Heart sounds: Normal heart sounds. No murmur heard.    No friction rub.  Pulmonary:     Effort: Pulmonary effort is normal. No accessory muscle usage or respiratory distress.     Breath sounds: Normal breath sounds. No decreased breath sounds, wheezing, rhonchi or rales.  Abdominal:     General: Bowel sounds are normal. There is no distension.      Palpations: Abdomen is soft. There is no mass.     Tenderness: There is no abdominal tenderness. There is no right CVA tenderness, left CVA tenderness, guarding or rebound.     Hernia: No hernia is present.  Musculoskeletal:        General: No tenderness or deformity. Normal range of motion.     Cervical back: Normal range  of motion and neck supple.  Lymphadenopathy:     Cervical: No cervical adenopathy.  Skin:    General: Skin is warm and dry.     Findings: No erythema.  Neurological:     Mental Status: She is alert and oriented to person, place, and time.     Cranial Nerves: No cranial nerve deficit.     Motor: Motor function is intact.     Coordination: Coordination is intact. Coordination normal.     Gait: Gait is intact.     Deep Tendon Reflexes:     Reflex Scores:      Patellar reflexes are 1+ on the right side and 1+ on the left side. Psychiatric:        Attention and Perception: Attention normal.        Mood and Affect: Mood normal.        Speech: Speech normal.        Behavior: Behavior normal.        Thought Content: Thought content normal.        Judgment: Judgment normal.          Patient has been counseled extensively about nutrition and exercise as well as the importance of adherence with medications and regular follow-up. The patient was given clear instructions to go to ER or return to medical center if symptoms don't improve, worsen or new problems develop. The patient verbalized understanding.   Follow-up: Return in about 6 months (around 07/15/2023), or if symptoms worsen or fail to improve.   Gildardo Pounds, FNP-BC [facility] [place] [place]   01/13/2023, 10:56 AM

## 2023-01-14 ENCOUNTER — Encounter (INDEPENDENT_AMBULATORY_CARE_PROVIDER_SITE_OTHER): Payer: Self-pay | Admitting: Internal Medicine

## 2023-01-14 ENCOUNTER — Ambulatory Visit (INDEPENDENT_AMBULATORY_CARE_PROVIDER_SITE_OTHER): Payer: Medicaid Other | Admitting: Internal Medicine

## 2023-01-14 VITALS — BP 102/68 | HR 89 | Temp 98.0°F | Ht 69.0 in | Wt 256.0 lb

## 2023-01-14 DIAGNOSIS — Z6836 Body mass index (BMI) 36.0-36.9, adult: Secondary | ICD-10-CM

## 2023-01-14 DIAGNOSIS — E88819 Insulin resistance, unspecified: Secondary | ICD-10-CM | POA: Diagnosis not present

## 2023-01-14 LAB — CBC WITH DIFFERENTIAL/PLATELET
Basophils Absolute: 0 10*3/uL (ref 0.0–0.2)
Basos: 1 %
EOS (ABSOLUTE): 0.2 10*3/uL (ref 0.0–0.4)
Eos: 3 %
Hematocrit: 40.1 % (ref 34.0–46.6)
Hemoglobin: 13.4 g/dL (ref 11.1–15.9)
Immature Grans (Abs): 0 10*3/uL (ref 0.0–0.1)
Immature Granulocytes: 0 %
Lymphocytes Absolute: 0.8 10*3/uL (ref 0.7–3.1)
Lymphs: 15 %
MCH: 29.3 pg (ref 26.6–33.0)
MCHC: 33.4 g/dL (ref 31.5–35.7)
MCV: 88 fL (ref 79–97)
Monocytes Absolute: 0.5 10*3/uL (ref 0.1–0.9)
Monocytes: 8 %
Neutrophils Absolute: 4.1 10*3/uL (ref 1.4–7.0)
Neutrophils: 73 %
Platelets: 266 10*3/uL (ref 150–450)
RBC: 4.57 x10E6/uL (ref 3.77–5.28)
RDW: 13 % (ref 11.7–15.4)
WBC: 5.6 10*3/uL (ref 3.4–10.8)

## 2023-01-14 LAB — LIPID PANEL
Chol/HDL Ratio: 4.3 ratio (ref 0.0–4.4)
Cholesterol, Total: 201 mg/dL — ABNORMAL HIGH (ref 100–199)
HDL: 47 mg/dL (ref >39)
LDL Chol Calc (NIH): 129 mg/dL — ABNORMAL HIGH (ref 0–99)
Triglycerides: 138 mg/dL (ref 0–149)
VLDL Cholesterol Cal: 25 mg/dL (ref 5–40)

## 2023-01-14 LAB — THYROID PANEL WITH TSH
Free Thyroxine Index: 1.8 (ref 1.2–4.9)
T3 Uptake Ratio: 25 % (ref 24–39)
T4, Total: 7.2 ug/dL (ref 4.5–12.0)
TSH: 1.25 u[IU]/mL (ref 0.450–4.500)

## 2023-01-14 LAB — CMP14+EGFR
ALT: 440 IU/L — ABNORMAL HIGH (ref 0–32)
AST: 231 IU/L — ABNORMAL HIGH (ref 0–40)
Albumin/Globulin Ratio: 1.8 (ref 1.2–2.2)
Albumin: 4.2 g/dL (ref 3.9–4.9)
Alkaline Phosphatase: 131 IU/L — ABNORMAL HIGH (ref 44–121)
BUN/Creatinine Ratio: 13 (ref 9–23)
BUN: 11 mg/dL (ref 6–24)
Bilirubin Total: 0.4 mg/dL (ref 0.0–1.2)
CO2: 24 mmol/L (ref 20–29)
Calcium: 9.5 mg/dL (ref 8.7–10.2)
Chloride: 102 mmol/L (ref 96–106)
Creatinine, Ser: 0.86 mg/dL (ref 0.57–1.00)
Globulin, Total: 2.3 g/dL (ref 1.5–4.5)
Glucose: 88 mg/dL (ref 70–99)
Potassium: 4.4 mmol/L (ref 3.5–5.2)
Sodium: 140 mmol/L (ref 134–144)
Total Protein: 6.5 g/dL (ref 6.0–8.5)
eGFR: 85 mL/min/{1.73_m2} (ref >59)

## 2023-01-14 MED ORDER — METFORMIN HCL ER 500 MG PO TB24: 500.0000 mg | ORAL_TABLET | Freq: Every day | ORAL | 0 refills | Status: DC

## 2023-01-14 NOTE — Assessment & Plan Note (Addendum)
Her HOMA-IR is 3.15 which is elevated. Optimal level < 1.9. This is complex condition associated with genetics, ectopic fat and lifestyle factors. Insulin resistance may result in weight gain, abnormal cravings (particularly for carbs) and fatigue. This may result in additional weight gain and lead to pre-diabetes and diabetes if untreated.   Lab Results  Component Value Date   HGBA1C 5.6 11/04/2022   Lab Results  Component Value Date   INSULIN 13.6 11/04/2022   Lab Results  Component Value Date   GLUCOSE WILL FOLLOW 01/13/2023   GLUCOSE 87 11/19/2017    In addition to nutritional and behavioral strategies after discussion of benefits and side effect she will be started on metformin 500 mg XR 1 tablet daily.

## 2023-01-14 NOTE — Progress Notes (Signed)
Office: [phone]  /  Fax: [phone]  WEIGHT SUMMARY AND BIOMETRICS  Vitals Temp: 98 F (36.7 C) BP: 102/68 Pulse Rate: 89 SpO2: 97 %   Anthropometric Measurements Height: 5\' 9"  (1.753 m) Weight: 256 lb (116.1 kg) BMI (Calculated): 37.79 Weight at Last Visit: 253 lb Weight Gained Since Last Visit: 3 lb Starting Weight: 268 lb Peak Weight: 450 lb   Body Composition  Body Fat %: 47 % Fat Mass (lbs): 120.4 lbs Muscle Mass (lbs): 129 lbs Total Body Water (lbs): 91 lbs Visceral Fat Rating : 12    HPI  Chief Complaint: OBESITY  Sarah Salas is here to discuss her progress with her obesity treatment plan. She is on the the Category 2 Plan and states she is following her eating plan approximately 80 % of the time. She states she is exercising 60-90 minutes 5 times per week.  Interval History:  Since last office visit she has gained 3 lbs. She reports suboptimal adherence due to reverting back to old eating habits. Denies problems with appetite and hunger signals.  Denies problems with satiety and satiation.  Denies problems with eating patterns and portion control.  Denies abnormal cravings. Denies feeling deprived or restricted.   Barriers identified:  boredom . Started on gabapentin for hot flashes, stress family increased eating.  Pharmacotherapy for weight loss: She is currently taking no anti-obesity medication.    ASSESSMENT AND PLAN  TREATMENT PLAN FOR OBESITY:  Recommended Dietary Goals  Sarah Salas is currently in the action stage of change. As such, her goal is to continue weight management plan. She has agreed to: continue current plan  Behavioral Intervention  We discussed the following Behavioral Modification Strategies today: increasing lean protein intake, avoiding skipping meals, and dealing with family stress and avoiding all or none midset .  Additional resources provided today: None  Recommended Physical Activity Goals  Sarah Salas has been  advised to work up to 150 minutes of moderate intensity aerobic activity a week and strengthening exercises 2-3 times per week for cardiovascular health, weight loss maintenance and preservation of muscle mass.   She has agreed to :  Continue current level of physical activity   Pharmacotherapy We discussed various medication options to help Sarah Salas with her weight loss efforts and we both agreed to :  Start metformin XR 500 mg 1 tablet daily for incretin effect and management of insulin resistance.  This will help also offset weight gain associated with psychotropic medication  ASSOCIATED CONDITIONS ADDRESSED TODAY  Insulin resistance Assessment & Plan: Her HOMA-IR is 3.15 which is elevated. Optimal level < 1.9. This is complex condition associated with genetics, ectopic fat and lifestyle factors. Insulin resistance may result in weight gain, abnormal cravings (particularly for carbs) and fatigue. This may result in additional weight gain and lead to pre-diabetes and diabetes if untreated.   Lab Results  Component Value Date   HGBA1C 5.6 11/04/2022   Lab Results  Component Value Date   INSULIN 13.6 11/04/2022   Lab Results  Component Value Date   GLUCOSE WILL FOLLOW 01/13/2023   GLUCOSE 87 11/19/2017    In addition to nutritional and behavioral strategies after discussion of benefits and side effect she will be started on metformin 500 mg XR 1 tablet daily.  Orders: -     metFORMIN HCl ER; Take 1 tablet (500 mg total) by mouth daily with breakfast.  Dispense: 30 tablet; Refill: 0  Class 2 severe obesity with serious comorbidity and body mass index (BMI)  of 36.0 to 36.9 in adult, unspecified obesity type     PHYSICAL EXAM:  Blood pressure 102/68, pulse 89, temperature 98 F (36.7 C), height 5\' 9"  (1.753 m), weight 256 lb (116.1 kg), SpO2 97 %. Body mass index is 37.8 kg/m.  General: She is overweight, cooperative, alert, well developed, and in no acute distress. PSYCH: Has  normal mood, affect and thought process.   HEENT: EOMI, sclerae are anicteric. Lungs: Normal breathing effort, no conversational dyspnea. Extremities: No edema.  Neurologic: No gross sensory or motor deficits. No tremors or fasciculations noted.    DIAGNOSTIC DATA REVIEWED:  BMET    Component Value Date/Time   NA WILL FOLLOW 01/13/2023 1344   K WILL FOLLOW 01/13/2023 1344   CL WILL FOLLOW 01/13/2023 1344   CO2 WILL FOLLOW 01/13/2023 1344   GLUCOSE WILL FOLLOW 01/13/2023 1344   GLUCOSE 125 (H) 05/20/2022 0155   BUN WILL FOLLOW 01/13/2023 1344   CREATININE WILL FOLLOW 01/13/2023 1344   CALCIUM WILL FOLLOW 01/13/2023 1344   GFRNONAA >60 05/20/2022 0155   GFRAA 101 11/25/2020 0919   Lab Results  Component Value Date   HGBA1C 5.6 11/04/2022   HGBA1C 5.3 12/26/2019   Lab Results  Component Value Date   INSULIN 13.6 11/04/2022   Lab Results  Component Value Date   TSH WILL FOLLOW 01/13/2023   CBC    Component Value Date/Time   WBC 5.6 01/13/2023 1344   WBC 6.7 05/12/2022 0959   RBC 4.57 01/13/2023 1344   RBC 4.85 05/12/2022 0959   HGB 13.4 01/13/2023 1344   HCT 40.1 01/13/2023 1344   PLT 266 01/13/2023 1344   MCV 88 01/13/2023 1344   MCH 29.3 01/13/2023 1344   MCH 29.5 05/12/2022 0959   MCHC 33.4 01/13/2023 1344   MCHC 32.9 05/12/2022 0959   RDW 13.0 01/13/2023 1344   Iron Studies    Component Value Date/Time   IRON 68 11/18/2022 1117   TIBC 329 11/18/2022 1117   FERRITIN 62 11/18/2022 1117   IRONPCTSAT 21 11/18/2022 1117   Lipid Panel     Component Value Date/Time   CHOL WILL FOLLOW 01/13/2023 1344   TRIG WILL FOLLOW 01/13/2023 1344   HDL WILL FOLLOW 01/13/2023 1344   CHOLHDL WILL FOLLOW 01/13/2023 1344   LDLCALC WILL FOLLOW 01/13/2023 1344   Hepatic Function Panel     Component Value Date/Time   PROT WILL FOLLOW 01/13/2023 1344   ALBUMIN WILL FOLLOW 01/13/2023 1344   AST WILL FOLLOW 01/13/2023 1344   ALT WILL FOLLOW 01/13/2023 1344   ALKPHOS  WILL FOLLOW 01/13/2023 1344   BILITOT WILL FOLLOW 01/13/2023 1344      Component Value Date/Time   TSH WILL FOLLOW 01/13/2023 1344   Nutritional Lab Results  Component Value Date   VD25OH 21.8 (L) 11/04/2022     Return in about 3 weeks (around 02/04/2023) for For Weight Mangement with Dr. Gerarda Fraction.Marland Kitchen She was informed of the importance of frequent follow up visits to maximize her success with intensive lifestyle modifications for her multiple health conditions.   ATTESTASTION STATEMENTS:  Reviewed by clinician on day of visit: allergies, medications, problem list, medical history, surgical history, family history, social history, and previous encounter notes.     Thomes Dinning, MD

## 2023-01-17 ENCOUNTER — Other Ambulatory Visit: Payer: Self-pay | Admitting: Nurse Practitioner

## 2023-01-17 DIAGNOSIS — R748 Abnormal levels of other serum enzymes: Secondary | ICD-10-CM

## 2023-01-18 ENCOUNTER — Encounter (HOSPITAL_COMMUNITY): Payer: Self-pay | Admitting: Psychiatry

## 2023-01-18 ENCOUNTER — Telehealth: Payer: Self-pay | Admitting: Nurse Practitioner

## 2023-01-18 ENCOUNTER — Encounter: Payer: Self-pay | Admitting: Internal Medicine

## 2023-01-18 ENCOUNTER — Telehealth (HOSPITAL_COMMUNITY): Payer: Self-pay | Admitting: *Deleted

## 2023-01-18 ENCOUNTER — Encounter: Payer: Self-pay | Admitting: Nurse Practitioner

## 2023-01-18 ENCOUNTER — Telehealth (HOSPITAL_BASED_OUTPATIENT_CLINIC_OR_DEPARTMENT_OTHER): Payer: Medicaid Other | Admitting: Psychiatry

## 2023-01-18 VITALS — Wt 253.0 lb

## 2023-01-18 DIAGNOSIS — F419 Anxiety disorder, unspecified: Secondary | ICD-10-CM | POA: Diagnosis not present

## 2023-01-18 DIAGNOSIS — F319 Bipolar disorder, unspecified: Secondary | ICD-10-CM | POA: Diagnosis not present

## 2023-01-18 DIAGNOSIS — F902 Attention-deficit hyperactivity disorder, combined type: Secondary | ICD-10-CM

## 2023-01-18 MED ORDER — TEMAZEPAM 7.5 MG PO CAPS: 7.5000 mg | ORAL_CAPSULE | Freq: Every evening | ORAL | 0 refills | Status: DC | PRN

## 2023-01-18 MED ORDER — LAMOTRIGINE 200 MG PO TABS: 200.0000 mg | ORAL_TABLET | Freq: Every day | ORAL | 0 refills | Status: DC

## 2023-01-18 MED ORDER — CLONAZEPAM 0.5 MG PO TABS: 0.5000 mg | ORAL_TABLET | Freq: Three times a day (TID) | ORAL | 0 refills | Status: DC | PRN

## 2023-01-18 MED ORDER — CITALOPRAM HYDROBROMIDE 20 MG PO TABS: 20.0000 mg | ORAL_TABLET | Freq: Every day | ORAL | 0 refills | Status: DC

## 2023-01-18 NOTE — Telephone Encounter (Signed)
Checking med list for verification

## 2023-01-18 NOTE — Telephone Encounter (Signed)
Patient called and was informed that then order has been put in, but has not received an call for the imaging.

## 2023-01-18 NOTE — Telephone Encounter (Signed)
Copied from CRM [id number]. Topic: General - Other >> Jan 18, 2023 10:05 AM Sarah Salas wrote: Reason for CRM: The patient has called to request orders for an ultrasound of the liver   Please contact the patient further when possible

## 2023-01-18 NOTE — Progress Notes (Signed)
[facility] MD Virtual Progress Note   Patient Location: Home Provider Location: Home Office  I connect with patient by telephone and verified that I am speaking with correct person by using two identifiers. I discussed the limitations of evaluation and management by telemedicine and the availability of in person appointments. I also discussed with the patient that there may be a patient responsible charge related to this service. The patient expressed understanding and agreed to proceed.  Sarah Salas [id number] 47 y.o.  01/18/2023 8:30 AM  History of Present Illness:  Patient is evaluated by phone session.  She was told to have a video session but she could not login for video.  She reported a lot of stress and anxiety lately.  She is having crying spells, poor sleep, racing thoughts and feeling overwhelmed.  She is requesting medication from her family member because she is tired taking care of other people.  She started taking trazodone but that did not help sleep.  She is no longer taking Abilify which was discontinued few months ago.  She is taking Lamictal, Klonopin and Celexa.  We started her on Strattera but did not see any improvement and discontinued on her own.  Patient started weight loss program and she had a blood work.  Her liver enzymes are very high.  She is not sure why but admitted sometimes she does Vape but no marijuana.  She is very emotional, labile and tearful during the session.  She is easily distracted and her attention concentration is poor.  Her speech is pressured.  Her support system is her boyfriend.  She denies any hallucination, paranoia or any suicidal thoughts.  She denies any anger but feels overwhelmed.  She is hoping to have some medication from her family but no details.  She denies drinking.   Past Psychiatric History: H/O ADHD, bipolar disorder.  Vyvanse did not help.  Adderall helped.  Strattera did not help.  Latuda help in the beginning  but then got worse with increased dose.  Vistaril did not help.  No h/o paranoia, inpatient treatment or suicidal attempt. H/O TBI.  Abilify helped but increased weight gain and blood sugar.  Trazodone did not help.   Outpatient Encounter Medications as of 01/18/2023  Medication Sig   ARIPiprazole (ABILIFY) 5 MG tablet Take 1 tablet by mouth once daily   citalopram (CELEXA) 20 MG tablet Take 1 tablet (20 mg total) by mouth daily.   clonazePAM (KLONOPIN) 0.5 MG tablet Take 1 tablet (0.5 mg total) by mouth 3 (three) times daily as needed for anxiety.   ergocalciferol (VITAMIN D2) 1.25 MG (50000 UT) capsule Take 1 capsule (50,000 Units total) by mouth once a week.   gabapentin (NEURONTIN) 100 MG capsule Take 1 capsule (100 mg total) by mouth 2 (two) times daily. Hot flashes   lamoTRIgine (LAMICTAL) 200 MG tablet Take 1 tablet (200 mg total) by mouth daily.   latanoprost (XALATAN) 0.005 % ophthalmic solution Place 1 drop into both eyes at bedtime.   metFORMIN (GLUCOPHAGE-XR) 500 MG 24 hr tablet Take 1 tablet (500 mg total) by mouth daily with breakfast.   No facility-administered encounter medications on file as of 01/18/2023.    Recent Results (from the past 2160 hour(s))  Vitamin B12     Status: None   Collection Time: 11/04/22  8:14 AM  Result Value Ref Range   Vitamin B-12 353 232 - 1,245 pg/mL  Comprehensive metabolic panel     Status: Abnormal  Collection Time: 11/04/22  8:14 AM  Result Value Ref Range   Glucose 94 70 - 99 mg/dL   BUN 14 6 - 24 mg/dL   Creatinine, Ser 6.81 0.57 - 1.00 mg/dL   eGFR 594 >70 RA/JHH/8.34   BUN/Creatinine Ratio 19 9 - 23   Sodium 144 134 - 144 mmol/L   Potassium 4.9 3.5 - 5.2 mmol/L   Chloride 106 96 - 106 mmol/L   CO2 22 20 - 29 mmol/L   Calcium 10.2 8.7 - 10.2 mg/dL   Total Protein 7.1 6.0 - 8.5 g/dL   Albumin 4.9 3.9 - 4.9 g/dL   Globulin, Total 2.2 1.5 - 4.5 g/dL   Albumin/Globulin Ratio 2.2 1.2 - 2.2   Bilirubin Total 0.2 0.0 - 1.2 mg/dL    Alkaline Phosphatase 127 (H) 44 - 121 IU/L   AST 25 0 - 40 IU/L   ALT 39 (H) 0 - 32 IU/L  Hemoglobin A1c     Status: None   Collection Time: 11/04/22  8:14 AM  Result Value Ref Range   Hgb A1c MFr Bld 5.6 4.8 - 5.6 %    Comment:          Prediabetes: 5.7 - 6.4          Diabetes: >6.4          Glycemic control for adults with diabetes: <7.0    Est. average glucose Bld gHb Est-mCnc 114 mg/dL  Insulin, random     Status: None   Collection Time: 11/04/22  8:14 AM  Result Value Ref Range   INSULIN 13.6 2.6 - 24.9 uIU/mL  TSH     Status: None   Collection Time: 11/04/22  8:14 AM  Result Value Ref Range   TSH 1.200 0.450 - 4.500 uIU/mL  VITAMIN D 25 Hydroxy (Vit-D Deficiency, Fractures)     Status: Abnormal   Collection Time: 11/04/22  8:14 AM  Result Value Ref Range   Vit D, 25-Hydroxy 21.8 (L) 30.0 - 100.0 ng/mL    Comment: Vitamin D deficiency has been defined by the Institute of Medicine and an Endocrine Society practice guideline as a level of serum 25-OH vitamin D less than 20 ng/mL (1,2). The Endocrine Society went on to further define vitamin D insufficiency as a level between 21 and 29 ng/mL (2). 1. IOM (Institute of Medicine). 2010. Dietary reference    intakes for calcium and D. [place]: The    [employer]. 2. Holick MF, Binkley [place], Bischoff-Ferrari HA, et al.    Evaluation, treatment, and prevention of vitamin D    deficiency: an Endocrine Society clinical practice    guideline. JCEM. 2011 Jul; 96(7):1911-30.   Lipid Panel With LDL/HDL Ratio     Status: Abnormal   Collection Time: 11/04/22  8:14 AM  Result Value Ref Range   Cholesterol, Total 237 (H) 100 - 199 mg/dL   Triglycerides 68 0 - 149 mg/dL   HDL 64 >37 mg/dL   VLDL Cholesterol Cal 12 5 - 40 mg/dL   LDL Chol Calc (NIH) 357 (H) 0 - 99 mg/dL   LDL/HDL Ratio 2.5 0.0 - 3.2 ratio    Comment:                                     LDL/HDL Ratio  Men   Women                               1/2 Avg.Risk  1.0    1.5                                   Avg.Risk  3.6    3.2                                2X Avg.Risk  6.2    5.0                                3X Avg.Risk  8.0    6.1   Gamma GT     Status: None   Collection Time: 11/18/22 11:17 AM  Result Value Ref Range   GGT 32 0 - 60 IU/L  Hepatitis B core antibody, IgM     Status: None   Collection Time: 11/18/22 11:17 AM  Result Value Ref Range   Hep B C IgM Negative Negative  Hepatitis B surface antibody,qualitative     Status: None   Collection Time: 11/18/22 11:17 AM  Result Value Ref Range   Hep B Surface Ab, Qual Non Reactive     Comment:               Non Reactive: Inconsistent with immunity,                             less than 10 mIU/mL               Reactive:     Consistent with immunity,                             greater than 9.9 mIU/mL   Hepatitis B surface antigen     Status: None   Collection Time: 11/18/22 11:17 AM  Result Value Ref Range   Hepatitis B Surface Ag Negative Negative  Iron, TIBC and Ferritin Panel     Status: None   Collection Time: 11/18/22 11:17 AM  Result Value Ref Range   Total Iron Binding Capacity 329 250 - 450 ug/dL   UIBC 161 096 - 045 ug/dL   Iron 68 27 - 409 ug/dL   Iron Saturation 21 15 - 55 %   Ferritin 62 15 - 150 ng/mL  Hepatitis C Antibody     Status: None   Collection Time: 11/18/22 11:17 AM  Result Value Ref Range   Hep C Virus Ab Non Reactive Non Reactive    Comment: HCV antibody alone does not differentiate between previously resolved infection and active infection. Equivocal and Reactive HCV antibody results should be followed up with an HCV RNA test to support the diagnosis of active HCV infection.   ALT+AST+TBili     Status: Abnormal   Collection Time: 11/18/22 11:17 AM  Result Value Ref Range   Bilirubin Total 0.2 0.0 - 1.2 mg/dL   AST 67 (H) 0 - 40 IU/L   ALT 292 (H) 0 - 32 IU/L  CMP14+EGFR     Status: Abnormal    Collection Time: 12/07/22 12:52  PM  Result Value Ref Range   Glucose 100 (H) 70 - 99 mg/dL   BUN 8 6 - 24 mg/dL   Creatinine, Ser 3.08 0.57 - 1.00 mg/dL   eGFR 90 >65 HQ/ION/6.29   BUN/Creatinine Ratio 10 9 - 23   Sodium 148 (H) 134 - 144 mmol/L   Potassium 4.2 3.5 - 5.2 mmol/L   Chloride 106 96 - 106 mmol/L   CO2 26 20 - 29 mmol/L   Calcium 9.6 8.7 - 10.2 mg/dL   Total Protein 7.0 6.0 - 8.5 g/dL   Albumin 4.8 3.9 - 4.9 g/dL   Globulin, Total 2.2 1.5 - 4.5 g/dL   Albumin/Globulin Ratio 2.2 1.2 - 2.2   Bilirubin Total 0.2 0.0 - 1.2 mg/dL   Alkaline Phosphatase 146 (H) 44 - 121 IU/L   AST 20 0 - 40 IU/L   ALT 42 (H) 0 - 32 IU/L  CMP14+EGFR     Status: Abnormal   Collection Time: 01/13/23  1:44 PM  Result Value Ref Range   Glucose 88 70 - 99 mg/dL   BUN 11 6 - 24 mg/dL   Creatinine, Ser 5.28 0.57 - 1.00 mg/dL   eGFR 85 >41 LK/GMW/1.02   BUN/Creatinine Ratio 13 9 - 23   Sodium 140 134 - 144 mmol/L   Potassium 4.4 3.5 - 5.2 mmol/L   Chloride 102 96 - 106 mmol/L   CO2 24 20 - 29 mmol/L   Calcium 9.5 8.7 - 10.2 mg/dL   Total Protein 6.5 6.0 - 8.5 g/dL   Albumin 4.2 3.9 - 4.9 g/dL   Globulin, Total 2.3 1.5 - 4.5 g/dL   Albumin/Globulin Ratio 1.8 1.2 - 2.2   Bilirubin Total 0.4 0.0 - 1.2 mg/dL   Alkaline Phosphatase 131 (H) 44 - 121 IU/L   AST 231 (H) 0 - 40 IU/L   ALT 440 (H) 0 - 32 IU/L  CBC with Differential     Status: None   Collection Time: 01/13/23  1:44 PM  Result Value Ref Range   WBC 5.6 3.4 - 10.8 x10E3/uL   RBC 4.57 3.77 - 5.28 x10E6/uL   Hemoglobin 13.4 11.1 - 15.9 g/dL   Hematocrit 72.5 36.6 - 46.6 %   MCV 88 79 - 97 fL   MCH 29.3 26.6 - 33.0 pg   MCHC 33.4 31.5 - 35.7 g/dL   RDW 44.0 34.7 - 42.5 %   Platelets 266 150 - 450 x10E3/uL   Neutrophils 73 Not Estab. %   Lymphs 15 Not Estab. %   Monocytes 8 Not Estab. %   Eos 3 Not Estab. %   Basos 1 Not Estab. %   Neutrophils Absolute 4.1 1.4 - 7.0 x10E3/uL   Lymphocytes Absolute 0.8 0.7 - 3.1 x10E3/uL    Monocytes Absolute 0.5 0.1 - 0.9 x10E3/uL   EOS (ABSOLUTE) 0.2 0.0 - 0.4 x10E3/uL   Basophils Absolute 0.0 0.0 - 0.2 x10E3/uL   Immature Granulocytes 0 Not Estab. %   Immature Grans (Abs) 0.0 0.0 - 0.1 x10E3/uL  Lipid panel     Status: Abnormal   Collection Time: 01/13/23  1:44 PM  Result Value Ref Range   Cholesterol, Total 201 (H) 100 - 199 mg/dL   Triglycerides 956 0 - 149 mg/dL   HDL 47 >38 mg/dL   VLDL Cholesterol Cal 25 5 - 40 mg/dL   LDL Chol Calc (NIH) 756 (H) 0 - 99 mg/dL   Chol/HDL Ratio 4.3 0.0 - 4.4 ratio  Comment:                                   T. Chol/HDL Ratio                                             Men  Women                               1/2 Avg.Risk  3.4    3.3                                   Avg.Risk  5.0    4.4                                2X Avg.Risk  9.6    7.1                                3X Avg.Risk 23.4   11.0   Thyroid Panel With TSH     Status: None   Collection Time: 01/13/23  1:44 PM  Result Value Ref Range   TSH 1.250 0.450 - 4.500 uIU/mL   T4, Total 7.2 4.5 - 12.0 ug/dL   T3 Uptake Ratio 25 24 - 39 %   Free Thyroxine Index 1.8 1.2 - 4.9     Psychiatric Specialty Exam: Physical Exam  Review of Systems  Weight 253 lb (114.8 kg).There is no height or weight on file to calculate BMI.  General Appearance: NA  Eye Contact:  NA  Speech:   fast and pressured  Volume:  Increased  Mood:  Anxious and Irritable  Affect:  Labile  Thought Process:  Descriptions of Associations: Circumstantial  Orientation:  Full (Time, Place, and Person)  Thought Content:  Rumination  Suicidal Thoughts:  No  Homicidal Thoughts:  No  Memory:  Immediate;   Fair Recent;   Fair Remote;   Fair  Judgement:  Fair  Insight:  Shallow  Psychomotor Activity:  Increased  Concentration:  Concentration: Fair and Attention Span: Fair  Recall:  [employer] of Knowledge:  Fair  Language:  Fair  Akathisia:  No  Handed:  Right  AIMS (if indicated):     Assets:   Communication Skills Desire for Improvement Housing Transportation  ADL's:  Intact  Cognition:  WNL  Sleep:  poor     Assessment/Plan: Bipolar I disorder  Anxiety  Attention deficit hyperactivity disorder (ADHD), combined type  I reviewed blood work results.  Her liver enzymes are very high.  It happened recently as previous liver enzymes were normal.  Her cholesterol and LDLs are improved from the past.  She just started taking gabapentin but she feel the labs are before she started taking gabapentin.  I discussed limitation of medication due to high liver enzyme but patient insists that she wants to try something so she can sleep better.  She is taking Klonopin 0.5 mg 3 times a day, Celexa 20 mg daily and Lamictal 200 mg daily.  She is no longer taking the  Strattera, trazodone.  She also stopped Abilify due to weight gain but that did help her.  She lost weight and now also doing weight loss program.  I recommend discontinue gabapentin and see primary care physician if she needed a referral for high liver enzymes.  She denies drinking.  I will add low-dose temazepam for sleep for short-term.  I will defer adding any antipsychotic medication until we have etiology of liver enzymes.  I will also forward my note to her PCP.  We talk about therapy but due to her insurance she had a difficult time to get appointment with therapist.  I will send some more options for therapy.  Discussed safety concern that anytime having active suicidal thoughts or homicidal thought then she need to call 911 or go to local emergency room.  Follow-up in 4 weeks.   Follow Up Instructions:     I discussed the assessment and treatment plan with the patient. The patient was provided an opportunity to ask questions and all were answered. The patient agreed with the plan and demonstrated an understanding of the instructions.   The patient was advised to call back or seek an in-person evaluation if the symptoms worsen or  if the condition fails to improve as anticipated.    Collaboration of Care: Other provider involved in patient's care AEB notes are available in epic to review.  Patient/Guardian was advised Release of Information must be obtained prior to any record release in order to collaborate their care with an outside provider. Patient/Guardian was advised if they have not already done so to contact the registration department to sign all necessary forms in order for us to release information regarding their care.   Consent: Patient/Guardian gives verbal consent for treatment and assignment of benefits for services provided during this visit. Patient/Guardian expressed understanding and agreed to proceed.     I provided 28 minutes of non face to face time during this encounter.  Cleotis NipperSyed T Omaya Nieland, MD 01/18/2023

## 2023-01-19 ENCOUNTER — Telehealth (HOSPITAL_COMMUNITY): Payer: Self-pay | Admitting: *Deleted

## 2023-01-19 NOTE — Telephone Encounter (Signed)
PA FOR TEMAZEPAM 7.5 MG CAPSULES #30 SUBMITTED TO [facility] VIA [employer].  AWAITING DETERMINATION.  KEY: [id number]

## 2023-01-20 ENCOUNTER — Telehealth (HOSPITAL_COMMUNITY): Payer: Self-pay | Admitting: *Deleted

## 2023-01-20 ENCOUNTER — Telehealth (HOSPITAL_COMMUNITY): Payer: Medicaid Other | Admitting: Psychiatry

## 2023-01-20 NOTE — Telephone Encounter (Signed)
[facility]  [facility] # APPROVED [id number]  MEMBER # [id number]   temazepam (RESTORIL) 7.5 MG capsule       # 30  01/19/19 -- 07/18/23  APPROVED J CODE

## 2023-01-27 ENCOUNTER — Ambulatory Visit (HOSPITAL_BASED_OUTPATIENT_CLINIC_OR_DEPARTMENT_OTHER): Payer: Medicaid Other | Admitting: Orthopaedic Surgery

## 2023-01-27 ENCOUNTER — Ambulatory Visit (INDEPENDENT_AMBULATORY_CARE_PROVIDER_SITE_OTHER): Payer: Medicaid Other

## 2023-01-27 DIAGNOSIS — M21161 Varus deformity, not elsewhere classified, right knee: Secondary | ICD-10-CM | POA: Diagnosis not present

## 2023-01-27 DIAGNOSIS — M1711 Unilateral primary osteoarthritis, right knee: Secondary | ICD-10-CM | POA: Diagnosis not present

## 2023-01-27 NOTE — Progress Notes (Signed)
Post Operative Evaluation    Procedure/Date of Surgery: Right knee tibial osteotomy with medial meniscal debridement 05/19/22  Interval History:   Presents today for follow-up of the above procedure.  Overall she is doing remarkably well at today's visit.  She does not complain of any joint line tenderness or pain when walking.  She does have some stiffness in the knee after sitting for longer duration although she is able to " shake this out after standing"  PMH/PSH/Family History/Social History/Meds/Allergies:    Past Medical History:  Diagnosis Date   ADHD (attention deficit hyperactivity disorder)    Anxiety    Arthritis    Bipolar 1 disorder    Brain injury 2005   Glaucoma    Hypertension    no longer treated   Past Surgical History:  Procedure Laterality Date   BRAIN SURGERY  10/28/2003   from [facility]   KNEE ARTHROSCOPY WITH MENISCAL REPAIR Right 05/19/2022   Procedure: RIGHT KNEE ARTHROSCOPY, MEDIAL MENISCAL DEBRIDEMENT AND ABRASION ARTHROPLASTY;  Surgeon: Huel Cote, MD;  Location: [facility] OR;  Service: Orthopedics;  Laterality: Right;   TIBIA OSTEOTOMY Right 05/19/2022   Procedure: RIGHT KNEE HIGH TIBIAL OSTEOTOMY;  Surgeon: Huel Cote, MD;  Location: [facility] OR;  Service: Orthopedics;  Laterality: Right;   WISDOM TOOTH EXTRACTION     Social History   Socioeconomic History   Marital status: Single    Spouse name: Not on file   Number of children: 0   Years of education: Not on file   Highest education level: Bachelor's degree (e.g., BA, AB, BS)  Occupational History   Occupation: takes care of parents  Tobacco Use   Smoking status: Former    Years: 20    Types: Cigarettes    Quit date: 06/13/2021    Years since quitting: 1.6   Smokeless tobacco: Never  Vaping Use   Vaping Use: Every day   Substances: Flavoring  Substance and Sexual Activity   Alcohol use: No   Drug use: No   Sexual activity: Not Currently    Birth  control/protection: I.U.D.  Other Topics Concern   Not on file  Social History Narrative   Not on file   Social Determinants of Health   Financial Resource Strain: Medium Risk (01/21/2018)   Overall Financial Resource Strain (CARDIA)    Difficulty of Paying Living Expenses: Somewhat hard  Food Insecurity: No Food Insecurity (01/21/2018)   Hunger Vital Sign    Worried About Running Out of Food in the Last Year: Never true    Ran Out of Food in the Last Year: Never true  Transportation Needs: No Transportation Needs (01/21/2018)   PRAPARE - Administrator, Civil Service (Medical): No    Lack of Transportation (Non-Medical): No  Physical Activity: Inactive (01/21/2018)   Exercise Vital Sign    Days of Exercise per Week: 0 days    Minutes of Exercise per Session: 0 min  Stress: Stress Concern Present (01/21/2018)   [employer] of Occupational Health - Occupational Stress Questionnaire    Feeling of Stress : To some extent  Social Connections: Moderately Isolated (01/21/2018)   Social Connection and Isolation Panel [NHANES]    Frequency of Communication with Friends and Family: More than three times a week    Frequency of Social Gatherings with  Friends and Family: More than three times a week    Attends Religious Services: Never    Database administrator or Organizations: No    Attends Engineer, structural: Never    Marital Status: Never married   Family History  Problem Relation Age of Onset   Bipolar disorder Mother    Hypertension Mother    Miscarriages / [place] Mother    Vision loss Mother    Varicose Veins Mother    High Cholesterol Mother    Anxiety disorder Mother    Obesity Mother    Alcohol abuse Father    Heart disease Father    Learning disabilities Father    High blood pressure Father    High Cholesterol Father    Sudden death Father    Obesity Father    Bipolar disorder Sister    Learning disabilities Sister    Bipolar disorder  Maternal Grandmother    Hypertension Maternal Grandfather    Cancer Paternal Grandmother    Cancer Paternal Grandfather    Diabetes Paternal Aunt    No Known Allergies Current Outpatient Medications  Medication Sig Dispense Refill   ARIPiprazole (ABILIFY) 5 MG tablet Take 1 tablet by mouth once daily 90 tablet 0   citalopram (CELEXA) 20 MG tablet Take 1 tablet (20 mg total) by mouth daily. 30 tablet 0   clonazePAM (KLONOPIN) 0.5 MG tablet Take 1 tablet (0.5 mg total) by mouth 3 (three) times daily as needed for anxiety. 90 tablet 0   ergocalciferol (VITAMIN D2) 1.25 MG (50000 UT) capsule Take 1 capsule (50,000 Units total) by mouth once a week. 12 capsule 0   gabapentin (NEURONTIN) 100 MG capsule Take 1 capsule (100 mg total) by mouth 2 (two) times daily. Hot flashes 60 capsule 1   lamoTRIgine (LAMICTAL) 200 MG tablet Take 1 tablet (200 mg total) by mouth daily. 30 tablet 0   latanoprost (XALATAN) 0.005 % ophthalmic solution Place 1 drop into both eyes at bedtime. 2.5 mL 6   metFORMIN (GLUCOPHAGE-XR) 500 MG 24 hr tablet Take 1 tablet (500 mg total) by mouth daily with breakfast. 30 tablet 0   temazepam (RESTORIL) 7.5 MG capsule Take 1-2 capsules (7.5-15 mg total) by mouth at bedtime and may repeat dose one time if needed. 30 capsule 0   No current facility-administered medications for this visit.   No results found.  Review of Systems:   A ROS was performed including pertinent positives and negatives as documented in the HPI.   Musculoskeletal Exam:    There were no vitals taken for this visit.  Right leg with no swelling.  This is improved since last visit.  No calf tenderness or palpable cord.  Incisions are well-appearing.  0 degrees to 130 degrees.  Improved quad strength and tone.  There is no medial or lateral joint line tenderness.  Sensation is intact in all distributions.  Fires tibialis anterior as well as EHL and gastrocsoleus.  2+ dorsalis pedis pulse  Imaging:    Limb  length views right leg: She now has an approximately neutral axis with plumbline running through the medial tibial spine  X-ray right 4 views: Healed right proximal tibial opening wedge osteotomy with medial joint space narrowing in setting of known mild medial arthritis  I personally reviewed and interpreted the radiographs.   Assessment:   47 year old female status post right knee high tibial osteotomy overall doing very well.  At this time she really has no pain and  she is very satisfied with the quality of her outcome.  She is walking without any type of limp.  She does have some soreness when sitting for longer period of time although this is overall mild. Plan :    -Return to clinic as needed      I personally saw and evaluated the patient, and participated in the management and treatment plan.  Huel Cote, MD Attending Physician, Orthopedic Surgery  This document was dictated using Dragon voice recognition software. A reasonable attempt at proof reading has been made to minimize errors.

## 2023-01-29 ENCOUNTER — Ambulatory Visit (HOSPITAL_BASED_OUTPATIENT_CLINIC_OR_DEPARTMENT_OTHER): Payer: Self-pay | Admitting: Orthopaedic Surgery

## 2023-01-29 ENCOUNTER — Ambulatory Visit
Admission: RE | Admit: 2023-01-29 | Discharge: 2023-01-29 | Disposition: A | Discharge status: Home / Self Care | Payer: Medicaid Other | Source: Ambulatory Visit | Attending: Nurse Practitioner | Admitting: Nurse Practitioner

## 2023-01-29 ENCOUNTER — Other Ambulatory Visit (HOSPITAL_BASED_OUTPATIENT_CLINIC_OR_DEPARTMENT_OTHER): Payer: Self-pay | Admitting: Orthopaedic Surgery

## 2023-01-29 ENCOUNTER — Encounter (HOSPITAL_BASED_OUTPATIENT_CLINIC_OR_DEPARTMENT_OTHER): Payer: Self-pay | Admitting: Orthopaedic Surgery

## 2023-01-29 DIAGNOSIS — R748 Abnormal levels of other serum enzymes: Secondary | ICD-10-CM

## 2023-01-29 MED ORDER — DICLOFENAC SODIUM 1 % EX GEL: 4.0000 g | Freq: Four times a day (QID) | CUTANEOUS | 3 refills | Status: DC

## 2023-02-01 ENCOUNTER — Encounter (INDEPENDENT_AMBULATORY_CARE_PROVIDER_SITE_OTHER): Payer: Self-pay | Admitting: Adult Health

## 2023-02-01 ENCOUNTER — Ambulatory Visit (INDEPENDENT_AMBULATORY_CARE_PROVIDER_SITE_OTHER): Payer: Medicaid Other | Admitting: Adult Health

## 2023-02-01 VITALS — BP 114/79 | HR 73 | Temp 97.9°F | Ht 69.0 in | Wt 244.0 lb

## 2023-02-01 DIAGNOSIS — E88819 Insulin resistance, unspecified: Secondary | ICD-10-CM | POA: Diagnosis not present

## 2023-02-01 DIAGNOSIS — Z6836 Body mass index (BMI) 36.0-36.9, adult: Secondary | ICD-10-CM

## 2023-02-01 DIAGNOSIS — E559 Vitamin D deficiency, unspecified: Secondary | ICD-10-CM

## 2023-02-01 DIAGNOSIS — R7989 Other specified abnormal findings of blood chemistry: Secondary | ICD-10-CM

## 2023-02-01 MED ORDER — ERGOCALCIFEROL 1.25 MG (50000 UT) PO CAPS: 50000.0000 [IU] | ORAL_CAPSULE | ORAL | 0 refills | Status: DC

## 2023-02-01 MED ORDER — METFORMIN HCL ER 500 MG PO TB24: 500.0000 mg | ORAL_TABLET | Freq: Every day | ORAL | 0 refills | Status: DC

## 2023-02-01 NOTE — Progress Notes (Signed)
WEIGHT SUMMARY AND BIOMETRICS  Vitals Temp: 97.9 F (36.6 C) BP: 114/79 Pulse Rate: 73 SpO2: 97 %   Anthropometric Measurements Height:  (1.753 m) Weight: 244 lb (110.7 kg) BMI (Calculated): 36.02 Weight at Last Visit: 256lb Weight Lost Since Last Visit: 12lb Weight Gained Since Last Visit: 0 Starting Weight: 263lb Total Weight Loss (lbs): 24 lb (10.9 kg)   Body Composition  Body Fat %: 46.2 % Fat Mass (lbs): 113 lbs Muscle Mass (lbs): 125 lbs Total Body Water (lbs): 88.2 lbs Visceral Fat Rating : 12   Other Clinical Data Fasting: Yes Labs: no Today's Visit #: 7 Starting Date: 08/27/22    Chief Complaint:   OBESITY Sarah Salas is here to discuss her progress with her obesity treatment plan. She is on the the Category 2 Plan and states she is following her eating plan approximately 95 % of the time.  She states she is exercising walking/toning 60+ minutes 5 times per week.   Interim History:  01/14/2023- started on daily Metformin XR - she endorses decrease in overall inflammation and abdominal bloating.  She is VERY pleased to feel and see results at this OV.  Reviewed Bioempedence results with pt: Muscle Mass: - 4 lbs Adipose Mass: - 7.4 lbs  Sarah Salas reports limiting daily caloric intake to 700-800/day, however mindful to increase protein at each meal. Reviewed-  11/04/22 07:00  RMR 2102  And if she undercuts her caloric intake too much, she can ultimately decrease her RMR and negatively impact muscle mass.  Subjective:   1. Insulin resistance Discussed Labs  Latest Reference Range & Units 11/04/22 08:14  Glucose 70 - 99 mg/dL 94  Hemoglobin B2W 4.8 - 5.6 % 5.6  Est. average glucose Bld gHb Est-mCnc mg/dL 413  INSULIN 2.6 - 24.4 uIU/mL 13.6  She was started on Metformin XR  at last OV- 01/14/2023 She reports intermittent loose stools, denies abd pain or hematochezia. She reports SIGNIFICANT reduction in abdominal bloating.  2.  Vitamin D deficiency Discussed Labs  Latest Reference Range & Units 11/04/22 08:14  Vitamin D, 25-Hydroxy 30.0 - 100.0 ng/mL 21.8 (L)  (L): Data is abnormally low She is on weekly Ergocalciferol- denies N/V/Muscle Weakness  3. Elevated LFTs Discussed Labs  Latest Reference Range & Units 11/18/22 11:17 12/07/22 12:52 01/13/23 13:44  AST 0 - 40 IU/L 67 (H) 20 231 (H)  ALT 0 - 32 IU/L 292 (H) 42 (H) 440 (H)  (H): Data is abnormally high  She denies RUQ pain or N/V/D  01/29/2023 EXAM: ABDOMEN ULTRASOUND COMPLETE   COMPARISON:  None Available.   FINDINGS: Gallbladder: No gallstones or wall thickening visualized. No sonographic Murphy sign noted by sonographer.   Common bile duct: Diameter: 5 mm   Liver: No focal lesion identified. Diffuse increased echotexture of the liver. Portal vein is patent on color Doppler imaging with normal direction of blood flow towards the liver.   IVC: No abnormality visualized.   Pancreas: Visualized portion unremarkable.   Spleen: Size and appearance within normal limits.   Right Kidney: Length: 10.3 cm. Echogenicity within normal limits. No mass or hydronephrosis visualized.   Left Kidney: Length: 11.3 cm. Echogenicity within normal limits. No mass or hydronephrosis visualized.   Abdominal aorta: No aneurysm visualized.   Other findings: None.   IMPRESSION: 1. Diffuse increased echotexture of the liver. This is a nonspecific finding but can be seen in fatty infiltration of liver. 2. No other abnormality identified.  Assessment/Plan:  1. Insulin resistance Refill - metFORMIN (GLUCOPHAGE-XR) 500 MG 24 hr tablet; Take 1 tablet (500 mg total) by mouth daily with breakfast.  Dispense: 30 tablet; Refill: 0  2. Vitamin D deficiency Refill - ergocalciferol (VITAMIN D2) 1.25 MG (50000 UT) capsule; Take 1 capsule (50,000 Units total) by mouth once a week.  Dispense: 12 capsule; Refill: 0  3. Elevated LFTs Avoid Hepatotoxic substances-  list provided to pt. 01/29/2023 [place] ABD Reviewed F/U with PCP as needed.  4. Class 2 severe obesity with serious comorbidity and body mass index (BMI) of 36.0 to 36.9 in adult, unspecified obesity type  Sarah Salas is currently in the action stage of change. As such, her goal is to continue with weight loss efforts. She has agreed to the Category 2 Plan.   Increase intake to least 1200/85 g protein/day  Exercise goals: For substantial health benefits, adults should do at least 150 minutes (2 hours and 30 minutes) a week of moderate-intensity, or 75 minutes (1 hour and 15 minutes) a week of vigorous-intensity aerobic physical activity, or an equivalent combination of moderate- and vigorous-intensity aerobic activity. Aerobic activity should be performed in episodes of at least 10 minutes, and preferably, it should be spread throughout the week.  Behavioral modification strategies: increasing lean protein intake, decreasing simple carbohydrates, increasing vegetables, increasing water intake, increasing high fiber foods, no skipping meals, meal planning and cooking strategies, keeping healthy foods in the home, better snacking choices, and planning for success.  Sarah Salas has agreed to follow-up with our clinic in 2 weeks. She was informed of the importance of frequent follow-up visits to maximize her success with intensive lifestyle modifications for her multiple health conditions.   Objective:   Blood pressure 114/79, pulse 73, temperature 97.9 F (36.6 C), height 5\' 9"  (1.753 m), weight 244 lb (110.7 kg), SpO2 97 %. Body mass index is 36.03 kg/m.  General: Cooperative, alert, well developed, in no acute distress. HEENT: Conjunctivae and lids unremarkable. Cardiovascular: Regular rhythm.  Lungs: Normal work of breathing. Neurologic: No focal deficits.   Lab Results  Component Value Date   CREATININE 0.86 01/13/2023   BUN 11 01/13/2023   NA 140 01/13/2023   K 4.4 01/13/2023   CL 102 01/13/2023    CO2 24 01/13/2023   Lab Results  Component Value Date   ALT 440 (H) 01/13/2023   AST 231 (H) 01/13/2023   GGT 32 11/18/2022   ALKPHOS 131 (H) 01/13/2023   BILITOT 0.4 01/13/2023   Lab Results  Component Value Date   HGBA1C 5.6 11/04/2022   HGBA1C 5.2 11/25/2020   HGBA1C 5.3 12/26/2019   Lab Results  Component Value Date   INSULIN 13.6 11/04/2022   Lab Results  Component Value Date   TSH 1.250 01/13/2023   Lab Results  Component Value Date   CHOL 201 (H) 01/13/2023   HDL 47 01/13/2023   LDLCALC 129 (H) 01/13/2023   TRIG 138 01/13/2023   CHOLHDL 4.3 01/13/2023   Lab Results  Component Value Date   VD25OH 21.8 (L) 11/04/2022   Lab Results  Component Value Date   WBC 5.6 01/13/2023   HGB 13.4 01/13/2023   HCT 40.1 01/13/2023   MCV 88 01/13/2023   PLT 266 01/13/2023   Lab Results  Component Value Date   IRON 68 11/18/2022   TIBC 329 11/18/2022   FERRITIN 62 11/18/2022    Attestation Statements:   Reviewed by clinician on day of visit: allergies, medications, problem list, medical history, surgical  history, family history, social history, and previous encounter notes.  I have reviewed the above documentation for accuracy and completeness, and I agree with the above. -  Meliza Kage d. Jireh Vinas, NP-C

## 2023-02-03 ENCOUNTER — Encounter (INDEPENDENT_AMBULATORY_CARE_PROVIDER_SITE_OTHER): Payer: Self-pay | Admitting: Internal Medicine

## 2023-02-05 ENCOUNTER — Ambulatory Visit (AMBULATORY_SURGERY_CENTER): Payer: Medicaid Other

## 2023-02-05 VITALS — Ht 69.0 in | Wt 243.0 lb

## 2023-02-05 DIAGNOSIS — Z1211 Encounter for screening for malignant neoplasm of colon: Secondary | ICD-10-CM

## 2023-02-05 MED ORDER — NA SULFATE-K SULFATE-MG SULF 17.5-3.13-1.6 GM/177ML PO SOLN: 1.0000 | Freq: Once | ORAL | 0 refills | Status: AC

## 2023-02-05 NOTE — Progress Notes (Signed)

## 2023-02-06 ENCOUNTER — Encounter (INDEPENDENT_AMBULATORY_CARE_PROVIDER_SITE_OTHER): Payer: Self-pay | Admitting: Internal Medicine

## 2023-02-09 ENCOUNTER — Other Ambulatory Visit (HOSPITAL_COMMUNITY): Payer: Self-pay | Admitting: Psychiatry

## 2023-02-09 DIAGNOSIS — F419 Anxiety disorder, unspecified: Secondary | ICD-10-CM

## 2023-02-09 DIAGNOSIS — F319 Bipolar disorder, unspecified: Secondary | ICD-10-CM

## 2023-02-15 ENCOUNTER — Encounter (HOSPITAL_COMMUNITY): Payer: Self-pay | Admitting: Psychiatry

## 2023-02-15 ENCOUNTER — Telehealth (HOSPITAL_BASED_OUTPATIENT_CLINIC_OR_DEPARTMENT_OTHER): Payer: Medicaid Other | Admitting: Psychiatry

## 2023-02-15 VITALS — Wt 243.0 lb

## 2023-02-15 DIAGNOSIS — F319 Bipolar disorder, unspecified: Secondary | ICD-10-CM

## 2023-02-15 DIAGNOSIS — F419 Anxiety disorder, unspecified: Secondary | ICD-10-CM | POA: Diagnosis not present

## 2023-02-15 DIAGNOSIS — F902 Attention-deficit hyperactivity disorder, combined type: Secondary | ICD-10-CM

## 2023-02-15 MED ORDER — LAMOTRIGINE 200 MG PO TABS: 200.0000 mg | ORAL_TABLET | Freq: Every day | ORAL | 0 refills | Status: DC

## 2023-02-15 MED ORDER — CITALOPRAM HYDROBROMIDE 20 MG PO TABS: 20.0000 mg | ORAL_TABLET | Freq: Every day | ORAL | 0 refills | Status: DC

## 2023-02-15 MED ORDER — OXAZEPAM 10 MG PO CAPS: 10.0000 mg | ORAL_CAPSULE | Freq: Three times a day (TID) | ORAL | 0 refills | Status: DC | PRN

## 2023-02-15 NOTE — Progress Notes (Addendum)
[facility] MD Virtual Progress Note   Patient Location: Home Provider Location: Home Office  I connect with patient by vieo and verified that I am speaking with correct person by using two identifiers. I discussed the limitations of evaluation and management by telemedicine and the availability of in person appointments. I also discussed with the patient that there may be a patient responsible charge related to this service. The patient expressed understanding and agreed to proceed.  Sarah Salas [id number] 47 y.o.  02/15/2023 10:49 AM  History of Present Illness:  Patient is evaluated by video session.  On the last visit we try temazepam but despite taking 50 mg she had not improvement in her sleep.  Her sleep remains 5 to 6 hours.  She is no longer taking Abilify, Strattera and gabapentin.  However her liver enzymes remains very high on her last blood work which was done 4 weeks ago.  Her AST is 231, alkaline phosphatase 131 and ALT 440.  Her cholesterol and LDL is improved but is still high.  Patient continues to struggle with anxiety, poor sleep, racing thoughts, attention, focus.  She is at weight loss program and she noticed may have lost some weight as she is drinking enough water and walking every day.  She is pleased as her mother, stepfather and sister going from vacation 2 weeks and she will have whole house for herself.  She has a car and no one to tell her to do things.  She is hoping her boyfriend will stay with her for a few days.  She is taking Lamictal, Klonopin, Celexa and temazepam.  She is out for temazepam for 2 days however has not noticed any significant decline in her sleep.  She remains labile, anxious, nervous.  Her speech is pressured.  She denies any impulsive behavior or impulsive eating.  She reported stopped smoking marijuana and waking.  She has appointment coming up soon with her primary care.  She is not sure if she was referred to a liver specialist due  to her high liver enzymes.  Past Psychiatric History: H/O ADHD, bipolar disorder.  Vyvanse did not help.  Adderall helped.  Strattera did not help.  Latuda help in the beginning but then got worse with increased dose.  Vistaril did not help.  No h/o paranoia, inpatient treatment or suicidal attempt. H/O TBI.  Abilify helped but increased weight gain and blood sugar.  Trazodone did not help.    Outpatient Encounter Medications as of 02/15/2023  Medication Sig   ARIPiprazole (ABILIFY) 5 MG tablet Take 1 tablet by mouth once daily   citalopram (CELEXA) 20 MG tablet Take 1 tablet (20 mg total) by mouth daily.   clonazePAM (KLONOPIN) 0.5 MG tablet Take 1 tablet (0.5 mg total) by mouth 3 (three) times daily as needed for anxiety.   diclofenac Sodium (VOLTAREN) 1 % GEL Apply 4 g topically 4 (four) times daily.   ergocalciferol (VITAMIN D2) 1.25 MG (50000 UT) capsule Take 1 capsule (50,000 Units total) by mouth once a week.   lamoTRIgine (LAMICTAL) 200 MG tablet Take 1 tablet (200 mg total) by mouth daily.   latanoprost (XALATAN) 0.005 % ophthalmic solution Place 1 drop into both eyes at bedtime.   metFORMIN (GLUCOPHAGE-XR) 500 MG 24 hr tablet Take 1 tablet (500 mg total) by mouth daily with breakfast.   temazepam (RESTORIL) 7.5 MG capsule Take 1-2 capsules (7.5-15 mg total) by mouth at bedtime and may repeat dose one time if needed.  No facility-administered encounter medications on file as of 02/15/2023.    Recent Results (from the past 2160 hour(s))  Gamma GT     Status: None   Collection Time: 11/18/22 11:17 AM  Result Value Ref Range   GGT 32 0 - 60 IU/L  Hepatitis B core antibody, IgM     Status: None   Collection Time: 11/18/22 11:17 AM  Result Value Ref Range   Hep B C IgM Negative Negative  Hepatitis B surface antibody,qualitative     Status: None   Collection Time: 11/18/22 11:17 AM  Result Value Ref Range   Hep B Surface Ab, Qual Non Reactive     Comment:               Non Reactive:  Inconsistent with immunity,                             less than 10 mIU/mL               Reactive:     Consistent with immunity,                             greater than 9.9 mIU/mL   Hepatitis B surface antigen     Status: None   Collection Time: 11/18/22 11:17 AM  Result Value Ref Range   Hepatitis B Surface Ag Negative Negative  Iron, TIBC and Ferritin Panel     Status: None   Collection Time: 11/18/22 11:17 AM  Result Value Ref Range   Total Iron Binding Capacity 329 250 - 450 ug/dL   UIBC 161 096 - 045 ug/dL   Iron 68 27 - 409 ug/dL   Iron Saturation 21 15 - 55 %   Ferritin 62 15 - 150 ng/mL  Hepatitis C Antibody     Status: None   Collection Time: 11/18/22 11:17 AM  Result Value Ref Range   Hep C Virus Ab Non Reactive Non Reactive    Comment: HCV antibody alone does not differentiate between previously resolved infection and active infection. Equivocal and Reactive HCV antibody results should be followed up with an HCV RNA test to support the diagnosis of active HCV infection.   ALT+AST+TBili     Status: Abnormal   Collection Time: 11/18/22 11:17 AM  Result Value Ref Range   Bilirubin Total 0.2 0.0 - 1.2 mg/dL   AST 67 (H) 0 - 40 IU/L   ALT 292 (H) 0 - 32 IU/L  CMP14+EGFR     Status: Abnormal   Collection Time: 12/07/22 12:52 PM  Result Value Ref Range   Glucose 100 (H) 70 - 99 mg/dL   BUN 8 6 - 24 mg/dL   Creatinine, Ser 8.11 0.57 - 1.00 mg/dL   eGFR 90 >91 YN/WGN/5.62   BUN/Creatinine Ratio 10 9 - 23   Sodium 148 (H) 134 - 144 mmol/L   Potassium 4.2 3.5 - 5.2 mmol/L   Chloride 106 96 - 106 mmol/L   CO2 26 20 - 29 mmol/L   Calcium 9.6 8.7 - 10.2 mg/dL   Total Protein 7.0 6.0 - 8.5 g/dL   Albumin 4.8 3.9 - 4.9 g/dL   Globulin, Total 2.2 1.5 - 4.5 g/dL   Albumin/Globulin Ratio 2.2 1.2 - 2.2   Bilirubin Total 0.2 0.0 - 1.2 mg/dL   Alkaline Phosphatase 146 (H) 44 - 121 IU/L   AST  20 0 - 40 IU/L   ALT 42 (H) 0 - 32 IU/L  CMP14+EGFR     Status: Abnormal    Collection Time: 01/13/23  1:44 PM  Result Value Ref Range   Glucose 88 70 - 99 mg/dL   BUN 11 6 - 24 mg/dL   Creatinine, Ser 1.61 0.57 - 1.00 mg/dL   eGFR 85 >09 UE/AVW/0.98   BUN/Creatinine Ratio 13 9 - 23   Sodium 140 134 - 144 mmol/L   Potassium 4.4 3.5 - 5.2 mmol/L   Chloride 102 96 - 106 mmol/L   CO2 24 20 - 29 mmol/L   Calcium 9.5 8.7 - 10.2 mg/dL   Total Protein 6.5 6.0 - 8.5 g/dL   Albumin 4.2 3.9 - 4.9 g/dL   Globulin, Total 2.3 1.5 - 4.5 g/dL   Albumin/Globulin Ratio 1.8 1.2 - 2.2   Bilirubin Total 0.4 0.0 - 1.2 mg/dL   Alkaline Phosphatase 131 (H) 44 - 121 IU/L   AST 231 (H) 0 - 40 IU/L   ALT 440 (H) 0 - 32 IU/L  CBC with Differential     Status: None   Collection Time: 01/13/23  1:44 PM  Result Value Ref Range   WBC 5.6 3.4 - 10.8 x10E3/uL   RBC 4.57 3.77 - 5.28 x10E6/uL   Hemoglobin 13.4 11.1 - 15.9 g/dL   Hematocrit 11.9 14.7 - 46.6 %   MCV 88 79 - 97 fL   MCH 29.3 26.6 - 33.0 pg   MCHC 33.4 31.5 - 35.7 g/dL   RDW 82.9 56.2 - 13.0 %   Platelets 266 150 - 450 x10E3/uL   Neutrophils 73 Not Estab. %   Lymphs 15 Not Estab. %   Monocytes 8 Not Estab. %   Eos 3 Not Estab. %   Basos 1 Not Estab. %   Neutrophils Absolute 4.1 1.4 - 7.0 x10E3/uL   Lymphocytes Absolute 0.8 0.7 - 3.1 x10E3/uL   Monocytes Absolute 0.5 0.1 - 0.9 x10E3/uL   EOS (ABSOLUTE) 0.2 0.0 - 0.4 x10E3/uL   Basophils Absolute 0.0 0.0 - 0.2 x10E3/uL   Immature Granulocytes 0 Not Estab. %   Immature Grans (Abs) 0.0 0.0 - 0.1 x10E3/uL  Lipid panel     Status: Abnormal   Collection Time: 01/13/23  1:44 PM  Result Value Ref Range   Cholesterol, Total 201 (H) 100 - 199 mg/dL   Triglycerides 865 0 - 149 mg/dL   HDL 47 >78 mg/dL   VLDL Cholesterol Cal 25 5 - 40 mg/dL   LDL Chol Calc (NIH) 469 (H) 0 - 99 mg/dL   Chol/HDL Ratio 4.3 0.0 - 4.4 ratio    Comment:                                   T. Chol/HDL Ratio                                             Men  Women                               1/2  Avg.Risk  3.4    3.3  Avg.Risk  5.0    4.4                                2X Avg.Risk  9.6    7.1                                3X Avg.Risk 23.4   11.0   Thyroid Panel With TSH     Status: None   Collection Time: 01/13/23  1:44 PM  Result Value Ref Range   TSH 1.250 0.450 - 4.500 uIU/mL   T4, Total 7.2 4.5 - 12.0 ug/dL   T3 Uptake Ratio 25 24 - 39 %   Free Thyroxine Index 1.8 1.2 - 4.9     Psychiatric Specialty Exam: Physical Exam  Review of Systems  Weight 243 lb (110.2 kg).There is no height or weight on file to calculate BMI.  General Appearance: Casual  Eye Contact:  Fair  Speech:   fast  Volume:  Increased  Mood:  Anxious and emotional  Affect:  Labile  Thought Process:  Descriptions of Associations: Intact  Orientation:  Full (Time, Place, and Person)  Thought Content:  Rumination  Suicidal Thoughts:  No  Homicidal Thoughts:  No  Memory:  Immediate;   Fair Recent;   Fair Remote;   Fair  Judgement:  Fair  Insight:  Shallow  Psychomotor Activity:  Increased  Concentration:  Concentration: Fair and Attention Span: Fair  Recall:  [employer] of Knowledge:  Fair  Language:  Good  Akathisia:  No  Handed:  Right  AIMS (if indicated):     Assets:  Communication Skills Desire for Improvement Housing Transportation  ADL's:  Intact  Cognition:  WNL  Sleep:  fair     Assessment/Plan: Bipolar I disorder (HCC) - Plan: lamoTRIgine (LAMICTAL) 200 MG tablet, oxazepam (SERAX) 10 MG capsule  Anxiety - Plan: citalopram (CELEXA) 20 MG tablet, oxazepam (SERAX) 10 MG capsule  Attention deficit hyperactivity disorder (ADHD), combined type  I reviewed blood work results.  Her liver enzymes are very high.  Her cholesterol is somewhat improved from the past.  Since she has not noticed significant improvement with temazepam, I will discontinue temazepam and also Klonopin and we will try Serax 10 mg up to 3 times a day as needed for anxiety.  I  explained this medicine has l does not interfere and does not metabolize in the liver.  In the past she was taking Strattera, Abilify, gabapentin but they were discontinued due to high liver enzymes.  I do feel she should see a liver doctor for high liver enzymes.  I will forward my note to her primary care.  For now continue Lamictal which also do not metabolized with liver.  Continue Celexa 20 mg a low-dose.  We will switch Celexa to a different antidepressant if liver enzymes remains high.  Other options are Fetzima and Savella.  She is on 1200-calorie diet.  Discussed safety concerns and any time having active suicidal thoughts or homicidal halogen need to call 911 or go to local emergency room.  Follow-up in month.   Follow Up Instructions:     I discussed the assessment and treatment plan with the patient. The patient was provided an opportunity to ask questions and all were answered. The patient agreed with the plan and demonstrated an understanding of the instructions.  The patient was advised to call back or seek an in-person evaluation if the symptoms worsen or if the condition fails to improve as anticipated.    Collaboration of Care: Other provider involved in patient's care AEB I will send my notes to her primary care.  Patient/Guardian was advised Release of Information must be obtained prior to any record release in order to collaborate their care with an outside provider. Patient/Guardian was advised if they have not already done so to contact the registration department to sign all necessary forms in order for [place] to release information regarding their care.   Consent: Patient/Guardian gives verbal consent for treatment and assignment of benefits for services provided during this visit. Patient/Guardian expressed understanding and agreed to proceed.     I provided 30 minutes of non face to face time during this encounter.  Note: This document was prepared by [employer] voice  dictation technology and any errors that results from this process are unintentional.    Cleotis Nipper, MD 02/15/2023

## 2023-02-17 ENCOUNTER — Telehealth (HOSPITAL_COMMUNITY): Payer: Self-pay | Admitting: *Deleted

## 2023-02-17 NOTE — Telephone Encounter (Signed)
I will reinforce that.

## 2023-02-17 NOTE — Telephone Encounter (Signed)
Should try Oxazepam because safe in high LFT. No klonopin and Restoril. Unless liver enzyme do not get better, no other benzo.

## 2023-02-17 NOTE — Telephone Encounter (Signed)
Pt called twice regarding restarting the Restoril for sleep. Pt message said that she decided to start the Serax 10 mg TID today ut says it doesn't make her sleepy so she will still need something to help her sleep. Something that doesn't cause weight gain. Please  advise.

## 2023-02-17 NOTE — Telephone Encounter (Signed)
Writer returned pt call regarding wanting something to make her sleep. Pt says she has not yet tried the Oxazepam because she fears it will make her too sleepy during the day. Pt is requesting refilling the Restoril. Writer discussed Dr. Sheela Stack plan from visit on 02/15/23 and the fact that she has had abnormal LFT's consistently and this is a factor as well as being on 2 benzos. Pt says that she still has Klonopin left and will just continue to use that TID but still insists on the Temazepam being restarted. Pt says taking OTC sleeper without success. Pt speech pressured and tangential. Writer advised to try the Oxazepam tomorrow and see how it works for her; do not continue Klonopin per Dr. Lolly Mustache note. Pt agreed to try the new medication tomorrow but requested I ask you about the Restoril. Pt has f/u appointment on 03/15/23. Please review and advise.

## 2023-02-19 NOTE — Telephone Encounter (Signed)
Pt agrees and will keep [place] updated.

## 2023-02-19 NOTE — Telephone Encounter (Signed)
She can skip the morning pills and take 2 at bedtime.  I cannot do more medication because of high liver enzymes.  We have to wait until her liver enzymes go down.

## 2023-02-22 ENCOUNTER — Ambulatory Visit (INDEPENDENT_AMBULATORY_CARE_PROVIDER_SITE_OTHER): Payer: Medicaid Other | Admitting: Internal Medicine

## 2023-02-22 ENCOUNTER — Encounter (INDEPENDENT_AMBULATORY_CARE_PROVIDER_SITE_OTHER): Payer: Self-pay | Admitting: Internal Medicine

## 2023-02-22 VITALS — BP 100/60 | HR 62 | Temp 97.7°F | Ht 69.0 in | Wt 244.0 lb

## 2023-02-22 DIAGNOSIS — R7989 Other specified abnormal findings of blood chemistry: Secondary | ICD-10-CM

## 2023-02-22 DIAGNOSIS — Z6836 Body mass index (BMI) 36.0-36.9, adult: Secondary | ICD-10-CM

## 2023-02-22 DIAGNOSIS — E88819 Insulin resistance, unspecified: Secondary | ICD-10-CM | POA: Diagnosis not present

## 2023-02-22 MED ORDER — METFORMIN HCL ER 500 MG PO TB24: 500.0000 mg | ORAL_TABLET | Freq: Every day | ORAL | 0 refills | Status: DC

## 2023-02-22 NOTE — Assessment & Plan Note (Addendum)
I have reviewed most recent liver enzymes ordered by another provider.  Her AST is 231 and ALT is 440.  Alkaline phosphatase is 131.  She is currently asymptomatic.  Her liver enzymes had once improved after discontinuation of some of her psychotropic medication, they are now once again elevated.  She had negative iron studies and hepatitis serologies.  She had liver ultrasound that showed hepatic steatosis.  Her fibrosis 4 Score = 1.86 (Indeterminate), so she needs further risk stratification.   She denies consumption of alcohol or use of over-the-counter supplements.  I think her psychiatric medications have been changed again.  Because of elevations 2-3 times the upper limit of normal I will refer her to gastroenterology.  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

## 2023-02-22 NOTE — Assessment & Plan Note (Signed)
Her HOMA-IR is 3.15 which is elevated. Optimal level < 1.9. This is complex condition associated with genetics, ectopic fat and lifestyle factors. Insulin resistance may result in weight gain, abnormal cravings (particularly for carbs) and fatigue. This may result in additional weight gain and lead to pre-diabetes and diabetes if untreated.   Lab Results  Component Value Date   HGBA1C 5.6 11/04/2022   Lab Results  Component Value Date   INSULIN 13.6 11/04/2022   Lab Results  Component Value Date   GLUCOSE 88 01/13/2023   GLUCOSE 87 11/19/2017   She is currently on metformin XR 500 mg once a day for pharmacoprophylaxis.  She is tolerating medication well without any adverse effects.  She will continue current medication also eliminating or reducing simple added sugars in her diet.

## 2023-02-22 NOTE — Progress Notes (Signed)
Office: [phone]  /  Fax: [phone]  WEIGHT SUMMARY AND BIOMETRICS  Vitals Temp: 97.7 F (36.5 C) BP: 100/60 Pulse Rate: 62 SpO2: 99 %   Anthropometric Measurements Height: 5\' 9"  (1.753 m) Weight: 244 lb (110.7 kg) BMI (Calculated): 36.02 Weight at Last Visit: 244 lb Weight Lost Since Last Visit: 0 lb Weight Gained Since Last Visit: 0 lb Starting Weight: 268 lb Total Weight Loss (lbs): 24 lb (10.9 kg)   Body Composition  Body Fat %: 450.6 % Fat Mass (lbs): 111.4 lbs Muscle Mass (lbs): 126.2 lbs Total Body Water (lbs): 88.2 lbs Visceral Fat Rating : 12    No data recorded Today's Visit #: 8  Starting Date: 08/17/22   HPI  Chief Complaint: OBESITY  Sarah Salas is here to discuss her progress with her obesity treatment plan. She is on the the Category 2 Plan and states she is following her eating plan approximately 95 % of the time. She states she is exercising 2 hours  5-6 times per week.  Interval History:  Since last office visit she has []  lost [x]  maintained [] gained weight.  Adherence to nutrition plan :  []  Excellent [x]  Good []  Fair []  Suboptimal []  Variable []  Gradually implementing [] Has not started implementation  Nutritional: Has been: [x]  eating fruits [x]  eating vegetables []  eating whole grains [x]  drinking water or low calorie drinks []  reducing portions []  eating out less [x]  reducing consumption of processed foods []  consuming prepackaged meals []  using protein shakes as a meal []  reading food labels  [x]  tracking or journaling calories.  Had been increasing calories as advised by another provider.  She also had been including exercise calories into her calorie allowance.  Orexigenic Control: [x] Denies [] Reports problems with appetite and hunger signals.  [x] Denies [] Reports problems with satiety and satiation.  [x] Denies [] Reports problems with eating patterns and portion control.  [x] Denies [] Reports strong cravings for highly  palatable foods [x] Denies [] Reports problems with feeling restricted or deprived  Stress levels: []  Low [x] Medium [] High   Barriers identified: none.   Pharmacotherapy for weight loss: She is currently taking Metformin (off label use for incretin effect and / or insulin resistance and / or diabetes prevention) .    ASSESSMENT AND PLAN  TREATMENT PLAN FOR OBESITY:  Recommended Dietary Goals  Sarah Salas is currently in the action stage of change. As such, her goal is to continue weight management plan. She has agreed to: continue current plan  Behavioral Intervention  We discussed the following Behavioral Modification Strategies today: increasing lean protein intake, decreasing simple carbohydrates , increasing vegetables, increasing lower glycemic fruits, increasing water intake, work on tracking and journaling calories using tracking application, and planning for success.  Additional resources provided today: None  Recommended Physical Activity Goals  [name] has been advised to work up to 150 minutes of moderate intensity aerobic activity a week and strengthening exercises 2-3 times per week for cardiovascular health, weight loss maintenance and preservation of muscle mass.   She has agreed to :  Continue current level of physical activity   Pharmacotherapy We discussed various medication options to help [name] with her weight loss efforts and we both agreed to : continue with nutritional and behavioral strategies and continue current anti-obesity medication regimen  ASSOCIATED CONDITIONS ADDRESSED TODAY  Elevated LFTs Assessment & Plan: I have reviewed most recent liver enzymes ordered by another provider.  Her AST is 231 and ALT is 440.  Alkaline phosphatase is 131.  She is  currently asymptomatic.  Her liver enzymes had once improved after discontinuation of some of her psychotropic medication, they are now once again elevated.  She had negative iron studies and hepatitis  serologies.  She had liver ultrasound that showed hepatic steatosis.  Her fibrosis 4 Score = 1.86 (Indeterminate), so she needs further risk stratification.   She denies consumption of alcohol or use of over-the-counter supplements.  I think her psychiatric medications have been changed again.  Because of elevations 2-3 times the upper limit of normal I will refer her to gastroenterology.  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  Orders: -     Ambulatory referral to Gastroenterology -     CMP14+EGFR -     Protime-INR  Insulin resistance Assessment & Plan: Her HOMA-IR is 3.15 which is elevated. Optimal level < 1.9. This is complex condition associated with genetics, ectopic fat and lifestyle factors. Insulin resistance may result in weight gain, abnormal cravings (particularly for carbs) and fatigue. This may result in additional weight gain and lead to pre-diabetes and diabetes if untreated.   Lab Results  Component Value Date   HGBA1C 5.6 11/04/2022   Lab Results  Component Value Date   INSULIN 13.6 11/04/2022   Lab Results  Component Value Date   GLUCOSE 88 01/13/2023   GLUCOSE 87 11/19/2017   She is currently on metformin XR 500 mg once a day for pharmacoprophylaxis.  She is tolerating medication well without any adverse effects.  She will continue current medication also eliminating or reducing simple added sugars in her diet.  Orders: -     metFORMIN HCl ER; Take 1 tablet (500 mg total) by mouth daily with breakfast.  Dispense: 30 tablet; Refill: 0  Class 2 severe obesity with serious comorbidity and body mass index (BMI) of 36.0 to 36.9 in adult, unspecified obesity type (HCC)    PHYSICAL EXAM:  Blood pressure 100/60, pulse 62, temperature 97.7 F (36.5 C), height 5\' 9"  (1.753 m), weight 244 lb (110.7 kg), SpO2 99 %. Body mass index is 36.03 kg/m.  General: She is overweight, cooperative, alert, well developed, and in no acute  distress. PSYCH: Has normal mood, affect and thought process.   HEENT: EOMI, sclerae are anicteric. Lungs: Normal breathing effort, no conversational dyspnea. Extremities: No edema.  Neurologic: No gross sensory or motor deficits. No tremors or fasciculations noted.    DIAGNOSTIC DATA REVIEWED:  BMET    Component Value Date/Time   NA 140 01/13/2023 1344   K 4.4 01/13/2023 1344   CL 102 01/13/2023 1344   CO2 24 01/13/2023 1344   GLUCOSE 88 01/13/2023 1344   GLUCOSE 125 (H) 05/20/2022 0155   BUN 11 01/13/2023 1344   CREATININE 0.86 01/13/2023 1344   CALCIUM 9.5 01/13/2023 1344   GFRNONAA >60 05/20/2022 0155   GFRAA 101 11/25/2020 0919   Lab Results  Component Value Date   HGBA1C 5.6 11/04/2022   HGBA1C 5.3 12/26/2019   Lab Results  Component Value Date   INSULIN 13.6 11/04/2022   Lab Results  Component Value Date   TSH 1.250 01/13/2023   CBC    Component Value Date/Time   WBC 5.6 01/13/2023 1344   WBC 6.7 05/12/2022 0959   RBC 4.57 01/13/2023 1344   RBC 4.85 05/12/2022 0959   HGB 13.4 01/13/2023 1344   HCT 40.1 01/13/2023 1344   PLT 266 01/13/2023 1344   MCV 88 01/13/2023 1344   MCH 29.3 01/13/2023 1344  MCH 29.5 05/12/2022 0959   MCHC 33.4 01/13/2023 1344   MCHC 32.9 05/12/2022 0959   RDW 13.0 01/13/2023 1344   Iron Studies    Component Value Date/Time   IRON 68 11/18/2022 1117   TIBC 329 11/18/2022 1117   FERRITIN 62 11/18/2022 1117   IRONPCTSAT 21 11/18/2022 1117   Lipid Panel     Component Value Date/Time   CHOL 201 (H) 01/13/2023 1344   TRIG 138 01/13/2023 1344   HDL 47 01/13/2023 1344   CHOLHDL 4.3 01/13/2023 1344   LDLCALC 129 (H) 01/13/2023 1344   Hepatic Function Panel     Component Value Date/Time   PROT 6.5 01/13/2023 1344   ALBUMIN 4.2 01/13/2023 1344   AST 231 (H) 01/13/2023 1344   ALT 440 (H) 01/13/2023 1344   ALKPHOS 131 (H) 01/13/2023 1344   BILITOT 0.4 01/13/2023 1344      Component Value Date/Time   TSH 1.250  01/13/2023 1344   Nutritional Lab Results  Component Value Date   VD25OH 21.8 (L) 11/04/2022     Return in about 3 weeks (around 03/15/2023) for For Weight Mangement with Dr. Rikki Spearing.Marland Kitchen She was informed of the importance of frequent follow up visits to maximize her success with intensive lifestyle modifications for her multiple health conditions.   ATTESTASTION STATEMENTS:  Reviewed by clinician on day of visit: allergies, medications, problem list, medical history, surgical history, family history, social history, and previous encounter notes.     Worthy Rancher, MD

## 2023-02-23 LAB — CMP14+EGFR
ALT: 42 IU/L — ABNORMAL HIGH (ref 0–32)
AST: 25 IU/L (ref 0–40)
Albumin/Globulin Ratio: 2.4 — ABNORMAL HIGH (ref 1.2–2.2)
Albumin: 4.8 g/dL (ref 3.9–4.9)
Alkaline Phosphatase: 119 IU/L (ref 44–121)
BUN/Creatinine Ratio: 20 (ref 9–23)
BUN: 16 mg/dL (ref 6–24)
Bilirubin Total: 0.5 mg/dL (ref 0.0–1.2)
CO2: 23 mmol/L (ref 20–29)
Calcium: 10.1 mg/dL (ref 8.7–10.2)
Chloride: 104 mmol/L (ref 96–106)
Creatinine, Ser: 0.81 mg/dL (ref 0.57–1.00)
Globulin, Total: 2 g/dL (ref 1.5–4.5)
Glucose: 92 mg/dL (ref 70–99)
Potassium: 4.8 mmol/L (ref 3.5–5.2)
Sodium: 143 mmol/L (ref 134–144)
Total Protein: 6.8 g/dL (ref 6.0–8.5)
eGFR: 91 mL/min/{1.73_m2} (ref >59)

## 2023-02-23 LAB — PROTIME-INR
INR: 1 (ref 0.9–1.2)
Prothrombin Time: 10.4 s (ref 9.1–12.0)

## 2023-03-03 ENCOUNTER — Ambulatory Visit (AMBULATORY_SURGERY_CENTER): Payer: Medicaid Other | Admitting: Internal Medicine

## 2023-03-03 ENCOUNTER — Encounter: Payer: Self-pay | Admitting: Internal Medicine

## 2023-03-03 VITALS — BP 94/60 | HR 53 | Temp 98.6°F | Resp 10 | Ht 69.0 in | Wt 243.0 lb

## 2023-03-03 DIAGNOSIS — D122 Benign neoplasm of ascending colon: Secondary | ICD-10-CM

## 2023-03-03 DIAGNOSIS — K529 Noninfective gastroenteritis and colitis, unspecified: Secondary | ICD-10-CM | POA: Diagnosis not present

## 2023-03-03 DIAGNOSIS — E785 Hyperlipidemia, unspecified: Secondary | ICD-10-CM | POA: Diagnosis not present

## 2023-03-03 DIAGNOSIS — F319 Bipolar disorder, unspecified: Secondary | ICD-10-CM | POA: Diagnosis not present

## 2023-03-03 DIAGNOSIS — Z1211 Encounter for screening for malignant neoplasm of colon: Secondary | ICD-10-CM

## 2023-03-03 DIAGNOSIS — I10 Essential (primary) hypertension: Secondary | ICD-10-CM | POA: Diagnosis not present

## 2023-03-03 DIAGNOSIS — F419 Anxiety disorder, unspecified: Secondary | ICD-10-CM | POA: Diagnosis not present

## 2023-03-03 MED ORDER — SODIUM CHLORIDE 0.9 % IV SOLN: 500.0000 mL | Freq: Once | INTRAVENOUS | Status: DC

## 2023-03-03 NOTE — Patient Instructions (Addendum)
    Handouts on polyps diverticulosis, & hemorrhoids  given to you today.   Await pathology results on polyps removed & on biopsies done   YOU HAD AN ENDOSCOPIC PROCEDURE TODAY AT THE [facility]:   Refer to the procedure report that was given to you for any specific questions about what was found during the examination.  If the procedure report does not answer your questions, please call your gastroenterologist to clarify.  If you requested that your care partner not be given the details of your procedure findings, then the procedure report has been included in a sealed envelope for you to review at your convenience later.  YOU SHOULD EXPECT: Some feelings of bloating in the abdomen. Passage of more gas than usual.  Walking can help get rid of the air that was put into your GI tract during the procedure and reduce the bloating. If you had a lower endoscopy (such as a colonoscopy or flexible sigmoidoscopy) you may notice spotting of blood in your stool or on the toilet paper. If you underwent a bowel prep for your procedure, you may not have a normal bowel movement for a few days.  Please Note:  You might notice some irritation and congestion in your nose or some drainage.  This is from the oxygen used during your procedure.  There is no need for concern and it should clear up in a day or so.  SYMPTOMS TO REPORT IMMEDIATELY:  Following lower endoscopy (colonoscopy or flexible sigmoidoscopy):  Excessive amounts of blood in the stool  Significant tenderness or worsening of abdominal pains  Swelling of the abdomen that is new, acute  Fever of 100F or higher   For urgent or emergent issues, a gastroenterologist can be reached at any hour by calling [phone]. Do not use MyChart messaging for urgent concerns.    DIET:  We do recommend a small meal at first, but then you may proceed to your regular diet.  Drink plenty of fluids but you should avoid alcoholic beverages for 24  hours.  ACTIVITY:  You should plan to take it easy for the rest of today and you should NOT DRIVE or use heavy machinery until tomorrow (because of the sedation medicines used during the test).    FOLLOW UP: Our staff will call the number listed on your records the next business day following your procedure.  We will call around 7:15- 8:00 am to check on you and address any questions or concerns that you may have regarding the information given to you following your procedure. If we do not reach you, we will leave a message.     If any biopsies were taken you will be contacted by phone or by letter within the next 1-3 weeks.  Please call us at [phone] if you have not heard about the biopsies in 3 weeks.    SIGNATURES/CONFIDENTIALITY: You and/or your care partner have signed paperwork which will be entered into your electronic medical record.  These signatures attest to the fact that that the information above on your After Visit Summary has been reviewed and is understood.  Full responsibility of the confidentiality of this discharge information lies with you and/or your care-partner.

## 2023-03-03 NOTE — Progress Notes (Signed)
Vss nad trans to pacu 

## 2023-03-03 NOTE — Progress Notes (Signed)
GASTROENTEROLOGY PROCEDURE H&P NOTE   Primary Care Physician: Claiborne Rigg, NP    Reason for Procedure:   Colon cancer screening  Plan:    Colonoscopy  Patient is appropriate for endoscopic procedure(s) in the ambulatory ([facility]) setting.  The nature of the procedure, as well as the risks, benefits, and alternatives were carefully and thoroughly reviewed with the patient. Ample time for discussion and questions allowed. The patient understood, was satisfied, and agreed to proceed.     HPI: Sarah Salas is a 47 y.o. female who presents for colonoscopy for colon cancer screening. Denies blood in stools, changes in bowel habits, or unintentional weight loss. Denies family history of colon cancer.  Past Medical History:  Diagnosis Date   ADHD (attention deficit hyperactivity disorder)    Anxiety    Arthritis    Bipolar 1 disorder (HCC)    Brain injury (HCC) 2005   Glaucoma    Hyperlipidemia    Hypertension    no longer treated    Past Surgical History:  Procedure Laterality Date   BRAIN SURGERY  10/28/2003   from [facility]   KNEE ARTHROSCOPY WITH MENISCAL REPAIR Right 05/19/2022   Procedure: RIGHT KNEE ARTHROSCOPY, MEDIAL MENISCAL DEBRIDEMENT AND ABRASION ARTHROPLASTY;  Surgeon: Huel Cote, MD;  Location: [facility] OR;  Service: Orthopedics;  Laterality: Right;   TIBIA OSTEOTOMY Right 05/19/2022   Procedure: RIGHT KNEE HIGH TIBIAL OSTEOTOMY;  Surgeon: Huel Cote, MD;  Location: [facility] OR;  Service: Orthopedics;  Laterality: Right;   WISDOM TOOTH EXTRACTION      Prior to Admission medications   Medication Sig Start Date End Date Taking? Authorizing Provider  ARIPiprazole (ABILIFY) 5 MG tablet Take 1 tablet by mouth once daily 07/22/22   Arfeen, Phillips Grout, MD  citalopram (CELEXA) 20 MG tablet Take 1 tablet (20 mg total) by mouth daily. 02/15/23 03/17/23  Arfeen, Phillips Grout, MD  diclofenac Sodium (VOLTAREN) 1 % GEL Apply 4 g topically 4 (four) times daily. 01/29/23   Huel Cote, MD   ergocalciferol (VITAMIN D2) 1.25 MG (50000 UT) capsule Take 1 capsule (50,000 Units total) by mouth once a week. 02/01/23   Danford, Orpha Bur D, NP  lamoTRIgine (LAMICTAL) 200 MG tablet Take 1 tablet (200 mg total) by mouth daily. 02/15/23   Arfeen, Phillips Grout, MD  latanoprost (XALATAN) 0.005 % ophthalmic solution Place 1 drop into both eyes at bedtime. 12/13/22   Claiborne Rigg, NP  metFORMIN (GLUCOPHAGE-XR) 500 MG 24 hr tablet Take 1 tablet (500 mg total) by mouth daily with breakfast. 02/22/23   Worthy Rancher, MD  oxazepam (SERAX) 10 MG capsule Take 1 capsule (10 mg total) by mouth 3 (three) times daily as needed for sleep or anxiety. 02/15/23 02/15/24  Arfeen, Phillips Grout, MD  temazepam (RESTORIL) 7.5 MG capsule Take 1-2 capsules (7.5-15 mg total) by mouth at bedtime and may repeat dose one time if needed. 01/18/23   Arfeen, Phillips Grout, MD    Current Outpatient Medications  Medication Sig Dispense Refill   ARIPiprazole (ABILIFY) 5 MG tablet Take 1 tablet by mouth once daily 90 tablet 0   citalopram (CELEXA) 20 MG tablet Take 1 tablet (20 mg total) by mouth daily. 30 tablet 0   diclofenac Sodium (VOLTAREN) 1 % GEL Apply 4 g topically 4 (four) times daily. 50 g 3   ergocalciferol (VITAMIN D2) 1.25 MG (50000 UT) capsule Take 1 capsule (50,000 Units total) by mouth once a week. 12 capsule 0   lamoTRIgine (LAMICTAL) 200 MG  tablet Take 1 tablet (200 mg total) by mouth daily. 30 tablet 0   latanoprost (XALATAN) 0.005 % ophthalmic solution Place 1 drop into both eyes at bedtime. 2.5 mL 6   metFORMIN (GLUCOPHAGE-XR) 500 MG 24 hr tablet Take 1 tablet (500 mg total) by mouth daily with breakfast. 30 tablet 0   oxazepam (SERAX) 10 MG capsule Take 1 capsule (10 mg total) by mouth 3 (three) times daily as needed for sleep or anxiety. 90 capsule 0   temazepam (RESTORIL) 7.5 MG capsule Take 1-2 capsules (7.5-15 mg total) by mouth at bedtime and may repeat dose one time if needed. 30 capsule 0   Current Facility-Administered  Medications  Medication Dose Route Frequency Provider Last Rate Last Admin   0.9 %  sodium chloride infusion  500 mL Intravenous Once Imogene Burn, MD        Allergies as of 03/03/2023   (No Known Allergies)    Family History  Problem Relation Age of Onset   Bipolar disorder Mother    Hypertension Mother    Miscarriages / [place] Mother    Vision loss Mother    Varicose Veins Mother    High Cholesterol Mother    Anxiety disorder Mother    Obesity Mother    Alcohol abuse Father    Heart disease Father    Learning disabilities Father    High blood pressure Father    High Cholesterol Father    Sudden death Father    Obesity Father    Bipolar disorder Sister    Learning disabilities Sister    Diabetes Paternal Aunt    Bipolar disorder Maternal Grandmother    Hypertension Maternal Grandfather    Cancer Paternal Grandmother    Cancer Paternal Grandfather    Colon cancer Neg Hx    Colon polyps Neg Hx    Esophageal cancer Neg Hx    Rectal cancer Neg Hx    Stomach cancer Neg Hx     Social History   Socioeconomic History   Marital status: Single    Spouse name: Not on file   Number of children: 0   Years of education: Not on file   Highest education level: Bachelor's degree (e.g., BA, AB, BS)  Occupational History   Occupation: takes care of parents  Tobacco Use   Smoking status: Former    Years: 20    Types: Cigarettes    Quit date: 06/13/2021    Years since quitting: 1.7   Smokeless tobacco: Never  Vaping Use   Vaping Use: Every day   Substances: Flavoring  Substance and Sexual Activity   Alcohol use: No   Drug use: No   Sexual activity: Not Currently    Birth control/protection: I.U.D.  Other Topics Concern   Not on file  Social History Narrative   Not on file   Social Determinants of Health   Financial Resource Strain: Medium Risk (01/21/2018)   Overall Financial Resource Strain (CARDIA)    Difficulty of Paying Living Expenses: Somewhat hard   Food Insecurity: No Food Insecurity (01/21/2018)   Hunger Vital Sign    Worried About Running Out of Food in the Last Year: Never true    Ran Out of Food in the Last Year: Never true  Transportation Needs: No Transportation Needs (01/21/2018)   PRAPARE - Administrator, Civil Service (Medical): No    Lack of Transportation (Non-Medical): No  Physical Activity: Inactive (01/21/2018)   Exercise Vital Sign  Days of Exercise per Week: 0 days    Minutes of Exercise per Session: 0 min  Stress: Stress Concern Present (01/21/2018)   [employer] of Occupational Health - Occupational Stress Questionnaire    Feeling of Stress : To some extent  Social Connections: Moderately Isolated (01/21/2018)   Social Connection and Isolation Panel [NHANES]    Frequency of Communication with Friends and Family: More than three times a week    Frequency of Social Gatherings with Friends and Family: More than three times a week    Attends Religious Services: Never    Database administrator or Organizations: No    Attends Banker Meetings: Never    Marital Status: Never married  Intimate Partner Violence: Not At Risk (01/21/2018)   Humiliation, Afraid, Rape, and Kick questionnaire    Fear of Current or Ex-Partner: No    Emotionally Abused: No    Physically Abused: No    Sexually Abused: No    Physical Exam: Vital signs in last 24 hours: BP (!) 119/59   Pulse 70   Temp 98.6 F (37 C)   Ht 5\' 9"  (1.753 m)   Wt 243 lb (110.2 kg)   SpO2 97%   BMI 35.88 kg/m  GEN: NAD EYE: Sclerae anicteric ENT: MMM CV: Non-tachycardic Pulm: No increased work of breathing GI: Soft, NT/ND NEURO:  Alert & Oriented   Eulah Pont, MD [facility]  03/03/2023 11:16 AM

## 2023-03-03 NOTE — Op Note (Signed)
[facility] Patient Name: Sarah Salas Procedure Date: 03/03/2023 11:44 AM MRN: [id number] Endoscopist: Madelyn Brunner [place] , , [id number] Age: 47 Referring MD:  Date of Birth: 28-Mar-1976 Gender: Female Account #: [id number] Procedure:                Colonoscopy Indications:              Screening for colorectal malignant neoplasm, This                            is the patient's first colonoscopy Medicines:                Monitored Anesthesia Care Procedure:                Pre-Anesthesia Assessment:                           - Prior to the procedure, a History and Physical                            was performed, and patient medications and                            allergies were reviewed. The patient's tolerance of                            previous anesthesia was also reviewed. The risks                            and benefits of the procedure and the sedation                            options and risks were discussed with the patient.                            All questions were answered, and informed consent                            was obtained. Prior Anticoagulants: The patient has                            taken no anticoagulant or antiplatelet agents. ASA                            Grade Assessment: II - A patient with mild systemic                            disease. After reviewing the risks and benefits,                            the patient was deemed in satisfactory condition to                            undergo the procedure.  After obtaining informed consent, the colonoscope                            was passed under direct vision. Throughout the                            procedure, the patient's blood pressure, pulse, and                            oxygen saturations were monitored continuously. The                            CF [id number] #[id number] was introduced through the anus                            and advanced to  the the terminal ileum. The                            colonoscopy was performed without difficulty. The                            patient tolerated the procedure well. The quality                            of the bowel preparation was good. The terminal                            ileum, ileocecal valve, appendiceal orifice, and                            rectum were photographed. Scope In: 11:56:32 AM Scope Out: 12:10:38 PM Scope Withdrawal Time: 0 hours 11 minutes 45 seconds  Total Procedure Duration: 0 hours 14 minutes 6 seconds  Findings:                 Localized mild mucosal changes characterized by                            congestion (edema) and erythema were found in the                            terminal ileum. Biopsies were taken with a cold                            forceps for histology.                           A 3 mm polyp was found in the ascending colon. The                            polyp was sessile. The polyp was removed with a                            cold snare. Resection and retrieval were  complete.                           Multiple diverticula were found in the sigmoid                            colon and descending colon.                           Non-bleeding internal hemorrhoids were found during                            retroflexion. Complications:            No immediate complications. Estimated Blood Loss:     Estimated blood loss was minimal. Impression:               - Mild mucosal changes were found in the ileum.                            Biopsied.                           - One 3 mm polyp in the ascending colon, removed                            with a cold snare. Resected and retrieved.                           - Diverticulosis in the sigmoid colon and in the                            descending colon.                           - Non-bleeding internal hemorrhoids. Recommendation:           - Discharge patient to home (with escort).                            - Await pathology results.                           - The findings and recommendations were discussed                            with the patient. Dr Particia Lather "Sarah Salas" Sarah Salas,  03/03/2023 12:13:53 PM

## 2023-03-03 NOTE — Progress Notes (Signed)
Called to room to assist during endoscopic procedure.  Patient ID and intended procedure confirmed with present staff. Received instructions for my participation in the procedure from the performing physician.  

## 2023-03-04 ENCOUNTER — Telehealth: Payer: Self-pay

## 2023-03-04 NOTE — Telephone Encounter (Signed)
  Follow up Call-     03/03/2023   11:12 AM  Call back number  Post procedure Call Back phone  # [phone]  Permission to leave phone message Yes     Patient questions:  Do you have a fever, pain , or abdominal swelling? No. Pain Score  0 *  Have you tolerated food without any problems? Yes.    Have you been able to return to your normal activities? Yes.    Do you have any questions about your discharge instructions: Diet   No. Medications  No. Follow up visit  No.  Do you have questions or concerns about your Care? No.  Actions: * If pain score is 4 or above: No action needed, pain <4.

## 2023-03-10 ENCOUNTER — Encounter: Payer: Self-pay | Admitting: Internal Medicine

## 2023-03-15 ENCOUNTER — Telehealth (HOSPITAL_BASED_OUTPATIENT_CLINIC_OR_DEPARTMENT_OTHER): Payer: Medicaid Other | Admitting: Psychiatry

## 2023-03-15 ENCOUNTER — Encounter (HOSPITAL_COMMUNITY): Payer: Self-pay | Admitting: Psychiatry

## 2023-03-15 VITALS — Wt 243.0 lb

## 2023-03-15 DIAGNOSIS — F419 Anxiety disorder, unspecified: Secondary | ICD-10-CM | POA: Diagnosis not present

## 2023-03-15 DIAGNOSIS — F319 Bipolar disorder, unspecified: Secondary | ICD-10-CM | POA: Diagnosis not present

## 2023-03-15 DIAGNOSIS — F902 Attention-deficit hyperactivity disorder, combined type: Secondary | ICD-10-CM

## 2023-03-15 MED ORDER — OXAZEPAM 10 MG PO CAPS: ORAL_CAPSULE | ORAL | 1 refills | Status: DC

## 2023-03-15 MED ORDER — CITALOPRAM HYDROBROMIDE 20 MG PO TABS
20.0000 mg | ORAL_TABLET | Freq: Every day | ORAL | 1 refills | Status: DC
Start: 2023-03-15 — End: 2023-06-24

## 2023-03-15 MED ORDER — LAMOTRIGINE 200 MG PO TABS: 200.0000 mg | ORAL_TABLET | Freq: Every day | ORAL | 1 refills | Status: DC

## 2023-03-15 NOTE — Progress Notes (Signed)
[facility] MD Virtual Progress Note   Patient Location: Home Provider Location: Home Office  I connect with patient by video and verified that I am speaking with correct person by using two identifiers. I discussed the limitations of evaluation and management by telemedicine and the availability of in person appointments. I also discussed with the patient that there may be a patient responsible charge related to this service. The patient expressed understanding and agreed to proceed.  Sarah Salas [id number] 47 y.o.  03/15/2023 10:09 AM  History of Present Illness:  Patient is evaluated by video session.  She is taking oxazepam 20 mg at bedtime and 10 mg in the morning to help with anxiety and sleep.  She noticed the combination is working and she is getting at least 5 to 6 hours of sleep.  She continues to struggle with attention, focus but does not want to take any medicine for ADHD since symptoms are manageable.  She is taking Celexa and Lamictal.  She has no rash, itching, tremors or shakes.  She is happy and very excited as mother and sister going for 2 weeks vacation to [place], [place] from June 18 to June 30.  She reported she will enjoy the house by herself as she can do things going to her own schedule.  She reported her boyfriend also visit her on a regular basis.  She is with weight loss program and happy that she is slowly and gradually losing weight.  When she stopped the program she was 260 but now she is 243.  She is watching her calorie intake, exercising, drinking enough water.  She recently had repeat liver enzymes and her liver enzymes are back to almost normal.  We have discontinued Abilify, Strattera, gabapentin and temazepam.  She only taking Celexa, Lamictal and oxazepam.  She reported in the beginning having some issues with sleep but now 20 mg oxazepam helping her sleep.  She denies any mania, psychosis, hallucination.  She denies any impulsive behavior.  She  does vaping.  Severe smoking.  She recently had a colonoscopy and waiting for the biopsy results.  She has some anxiety and some time panic attack but they are not very intense.  She able to manage her symptoms.  She like to continue her current medication.  She is not seeing any therapist at this time.  Past Psychiatric History: H/O ADHD, bipolar disorder. Vyvanse, Strattera, Hydroxyzine and Trazodone did not help.  Adderall helped.  Latuda helped in the beginning but then got worse with increased dose.  No h/o paranoia, inpatient treatment or suicidal attempt. H/O TBI.  Gabapentin, Restoril and Abilify helped but caused increased weight gain, liver enzyme and blood sugar.     Outpatient Encounter Medications as of 03/15/2023  Medication Sig   ARIPiprazole (ABILIFY) 5 MG tablet Take 1 tablet by mouth once daily   citalopram (CELEXA) 20 MG tablet Take 1 tablet (20 mg total) by mouth daily.   diclofenac Sodium (VOLTAREN) 1 % GEL Apply 4 g topically 4 (four) times daily.   ergocalciferol (VITAMIN D2) 1.25 MG (50000 UT) capsule Take 1 capsule (50,000 Units total) by mouth once a week.   lamoTRIgine (LAMICTAL) 200 MG tablet Take 1 tablet (200 mg total) by mouth daily.   latanoprost (XALATAN) 0.005 % ophthalmic solution Place 1 drop into both eyes at bedtime.   metFORMIN (GLUCOPHAGE-XR) 500 MG 24 hr tablet Take 1 tablet (500 mg total) by mouth daily with breakfast.   oxazepam (SERAX) 10  MG capsule Take 1 capsule (10 mg total) by mouth 3 (three) times daily as needed for sleep or anxiety.   temazepam (RESTORIL) 7.5 MG capsule Take 1-2 capsules (7.5-15 mg total) by mouth at bedtime and may repeat dose one time if needed.   No facility-administered encounter medications on file as of 03/15/2023.    Recent Results (from the past 2160 hour(s))  CMP14+EGFR     Status: Abnormal   Collection Time: 01/13/23  1:44 PM  Result Value Ref Range   Glucose 88 70 - 99 mg/dL   BUN 11 6 - 24 mg/dL   Creatinine, Ser  3.24 0.57 - 1.00 mg/dL   eGFR 85 >40 NU/UVO/5.36   BUN/Creatinine Ratio 13 9 - 23   Sodium 140 134 - 144 mmol/L   Potassium 4.4 3.5 - 5.2 mmol/L   Chloride 102 96 - 106 mmol/L   CO2 24 20 - 29 mmol/L   Calcium 9.5 8.7 - 10.2 mg/dL   Total Protein 6.5 6.0 - 8.5 g/dL   Albumin 4.2 3.9 - 4.9 g/dL   Globulin, Total 2.3 1.5 - 4.5 g/dL   Albumin/Globulin Ratio 1.8 1.2 - 2.2   Bilirubin Total 0.4 0.0 - 1.2 mg/dL   Alkaline Phosphatase 131 (H) 44 - 121 IU/L   AST 231 (H) 0 - 40 IU/L   ALT 440 (H) 0 - 32 IU/L  CBC with Differential     Status: None   Collection Time: 01/13/23  1:44 PM  Result Value Ref Range   WBC 5.6 3.4 - 10.8 x10E3/uL   RBC 4.57 3.77 - 5.28 x10E6/uL   Hemoglobin 13.4 11.1 - 15.9 g/dL   Hematocrit 64.4 03.4 - 46.6 %   MCV 88 79 - 97 fL   MCH 29.3 26.6 - 33.0 pg   MCHC 33.4 31.5 - 35.7 g/dL   RDW 74.2 59.5 - 63.8 %   Platelets 266 150 - 450 x10E3/uL   Neutrophils 73 Not Estab. %   Lymphs 15 Not Estab. %   Monocytes 8 Not Estab. %   Eos 3 Not Estab. %   Basos 1 Not Estab. %   Neutrophils Absolute 4.1 1.4 - 7.0 x10E3/uL   Lymphocytes Absolute 0.8 0.7 - 3.1 x10E3/uL   Monocytes Absolute 0.5 0.1 - 0.9 x10E3/uL   EOS (ABSOLUTE) 0.2 0.0 - 0.4 x10E3/uL   Basophils Absolute 0.0 0.0 - 0.2 x10E3/uL   Immature Granulocytes 0 Not Estab. %   Immature Grans (Abs) 0.0 0.0 - 0.1 x10E3/uL  Lipid panel     Status: Abnormal   Collection Time: 01/13/23  1:44 PM  Result Value Ref Range   Cholesterol, Total 201 (H) 100 - 199 mg/dL   Triglycerides 756 0 - 149 mg/dL   HDL 47 >43 mg/dL   VLDL Cholesterol Cal 25 5 - 40 mg/dL   LDL Chol Calc (NIH) 329 (H) 0 - 99 mg/dL   Chol/HDL Ratio 4.3 0.0 - 4.4 ratio    Comment:                                   T. Chol/HDL Ratio                                             Men  Women  1/2 Avg.Risk  3.4    3.3                                   Avg.Risk  5.0    4.4                                2X Avg.Risk  9.6     7.1                                3X Avg.Risk 23.4   11.0   Thyroid Panel With TSH     Status: None   Collection Time: 01/13/23  1:44 PM  Result Value Ref Range   TSH 1.250 0.450 - 4.500 uIU/mL   T4, Total 7.2 4.5 - 12.0 ug/dL   T3 Uptake Ratio 25 24 - 39 %   Free Thyroxine Index 1.8 1.2 - 4.9  CMP14+EGFR     Status: Abnormal   Collection Time: 02/22/23  7:44 AM  Result Value Ref Range   Glucose 92 70 - 99 mg/dL   BUN 16 6 - 24 mg/dL   Creatinine, Ser 1.61 0.57 - 1.00 mg/dL   eGFR 91 >09 UE/AVW/0.98   BUN/Creatinine Ratio 20 9 - 23   Sodium 143 134 - 144 mmol/L   Potassium 4.8 3.5 - 5.2 mmol/L   Chloride 104 96 - 106 mmol/L   CO2 23 20 - 29 mmol/L   Calcium 10.1 8.7 - 10.2 mg/dL   Total Protein 6.8 6.0 - 8.5 g/dL   Albumin 4.8 3.9 - 4.9 g/dL   Globulin, Total 2.0 1.5 - 4.5 g/dL   Albumin/Globulin Ratio 2.4 (H) 1.2 - 2.2   Bilirubin Total 0.5 0.0 - 1.2 mg/dL   Alkaline Phosphatase 119 44 - 121 IU/L   AST 25 0 - 40 IU/L   ALT 42 (H) 0 - 32 IU/L  INR/PT     Status: None   Collection Time: 02/22/23  7:44 AM  Result Value Ref Range   INR 1.0 0.9 - 1.2    Comment: Reference interval is for non-anticoagulated patients. Suggested INR therapeutic range for Vitamin K antagonist therapy:    Standard Dose (moderate intensity                   therapeutic range):       2.0 - 3.0    Higher intensity therapeutic range       2.5 - 3.5    Prothrombin Time 10.4 9.1 - 12.0 sec     Psychiatric Specialty Exam: Physical Exam  Review of Systems  Weight 243 lb (110.2 kg).There is no height or weight on file to calculate BMI.  General Appearance: Casual  Eye Contact:  Good  Speech:   fast  Volume:  Increased  Mood:  Anxious  Affect:  Labile  Thought Process:  Descriptions of Associations: Intact  Orientation:  Full (Time, Place, and Person)  Thought Content:  Rumination  Suicidal Thoughts:  No  Homicidal Thoughts:  No  Memory:  Immediate;   Good Recent;   Fair Remote;   Fair   Judgement:  Intact  Insight:  Present  Psychomotor Activity:  Increased  Concentration:  Concentration: Fair and Attention Span: Fair  Recall:  [employer] of Knowledge:  Fair  Language:  Good  Akathisia:  No  Handed:  Right  AIMS (if indicated):     Assets:  Communication Skills Desire for Improvement Housing Social Support Transportation  ADL's:  Intact  Cognition:  WNL  Sleep:  better     Assessment/Plan: Bipolar I disorder (HCC) - Plan: lamoTRIgine (LAMICTAL) 200 MG tablet, oxazepam (SERAX) 10 MG capsule  Anxiety - Plan: citalopram (CELEXA) 20 MG tablet, lamoTRIgine (LAMICTAL) 200 MG tablet, oxazepam (SERAX) 10 MG capsule  Attention deficit hyperactivity disorder (ADHD), combined type - Plan: citalopram (CELEXA) 20 MG tablet, lamoTRIgine (LAMICTAL) 200 MG tablet, oxazepam (SERAX) 10 MG capsule  I reviewed blood work results.  Her liver enzymes came back to normal range.  She is no longer taking Abilify, gabapentin, temazepam and Strattera.  She liked the new medication but in the beginning have some trouble sleeping however since the dose adjusted she is doing better.  I encouraged to keep appointment with liver doctor and with weight loss program.  She does not feel she need anything for ADHD and has symptoms are there but manageable.  Her anxiety is also much better than before.  Continue oxazepam 10 mg in the morning and 20 mg at bedtime, Celexa 20 mg daily and Lamictal 200 mg daily.  Recommend to call us back if she is any question or any concern.  Follow-up in 2 months.  Encourage watching her calorie intake, eating healthy and exercise.   Follow Up Instructions:     I discussed the assessment and treatment plan with the patient. The patient was provided an opportunity to ask questions and all were answered. The patient agreed with the plan and demonstrated an understanding of the instructions.   The patient was advised to call back or seek an in-person evaluation if the  symptoms worsen or if the condition fails to improve as anticipated.    Collaboration of Care: Other provider involved in patient's care AEB notes are available in epic to review.  Patient/Guardian was advised Release of Information must be obtained prior to any record release in order to collaborate their care with an outside provider. Patient/Guardian was advised if they have not already done so to contact the registration department to sign all necessary forms in order for [place] to release information regarding their care.   Consent: Patient/Guardian gives verbal consent for treatment and assignment of benefits for services provided during this visit. Patient/Guardian expressed understanding and agreed to proceed.     I provided 31 minutes of non face to face time during this encounter.  Note: This document was prepared by [employer] voice dictation technology and any errors that results from this process are unintentional.    Cleotis Nipper, MD 03/15/2023

## 2023-03-18 ENCOUNTER — Telehealth (HOSPITAL_COMMUNITY): Payer: Self-pay | Admitting: *Deleted

## 2023-03-18 NOTE — Telephone Encounter (Signed)
PA FOR OXAZEPAM 10 MG CAPSULES HAS BEEN SUBMITTED TO [facility] [employer].  AWAITING DETERMINATION.

## 2023-03-19 ENCOUNTER — Telehealth (HOSPITAL_COMMUNITY): Payer: Self-pay | Admitting: *Deleted

## 2023-03-19 NOTE — Telephone Encounter (Signed)
Fax receive from [facility] advising that the Oxazepam 10 mg caps does not require a PA.

## 2023-03-25 ENCOUNTER — Encounter (INDEPENDENT_AMBULATORY_CARE_PROVIDER_SITE_OTHER): Payer: Self-pay | Admitting: Internal Medicine

## 2023-03-25 ENCOUNTER — Ambulatory Visit (INDEPENDENT_AMBULATORY_CARE_PROVIDER_SITE_OTHER): Payer: Medicaid Other | Admitting: Internal Medicine

## 2023-03-25 VITALS — BP 102/67 | HR 81 | Temp 97.8°F | Ht 69.0 in | Wt 236.0 lb

## 2023-03-25 DIAGNOSIS — R7989 Other specified abnormal findings of blood chemistry: Secondary | ICD-10-CM | POA: Diagnosis not present

## 2023-03-25 DIAGNOSIS — E88819 Insulin resistance, unspecified: Secondary | ICD-10-CM

## 2023-03-25 DIAGNOSIS — Z6834 Body mass index (BMI) 34.0-34.9, adult: Secondary | ICD-10-CM | POA: Diagnosis not present

## 2023-03-25 DIAGNOSIS — Z6836 Body mass index (BMI) 36.0-36.9, adult: Secondary | ICD-10-CM

## 2023-03-25 MED ORDER — METFORMIN HCL ER 500 MG PO TB24: 500.0000 mg | ORAL_TABLET | Freq: Every day | ORAL | 0 refills | Status: DC

## 2023-03-25 NOTE — Assessment & Plan Note (Signed)
Her HOMA-IR is 3.15 which is elevated. Optimal level < 1.9. This is complex condition associated with genetics, ectopic fat and lifestyle factors. Insulin resistance may result in weight gain, abnormal cravings (particularly for carbs) and fatigue. This may result in additional weight gain and lead to pre-diabetes and diabetes if untreated.   Lab Results  Component Value Date   HGBA1C 5.6 11/04/2022   Lab Results  Component Value Date   INSULIN 13.6 11/04/2022   Lab Results  Component Value Date   GLUCOSE 92 02/22/2023   GLUCOSE 87 11/19/2017   She is currently on metformin XR 500 mg once a day for pharmacoprophylaxis and incretin effect.  She is tolerating medication well without any adverse effects.  She will continue current medication also eliminating or reducing simple added sugars in her diet.

## 2023-03-25 NOTE — Progress Notes (Addendum)
Office: [phone]  /  Fax: [phone]  WEIGHT SUMMARY AND BIOMETRICS  Vitals Temp: 97.8 F (36.6 C) BP: 102/67 Pulse Rate: 81 SpO2: 97 %   Anthropometric Measurements Height: 5\' 9"  (1.753 m) Weight: 236 lb (107 kg) BMI (Calculated): 34.84 Weight at Last Visit: 244 lb Weight Lost Since Last Visit: 8 lb Starting Weight: 268 lb Total Weight Loss (lbs): 32 lb (14.5 kg)   Body Composition  Body Fat %: 44.6 % Fat Mass (lbs): 1056 lbs Muscle Mass (lbs): 124.2 lbs Total Body Water (lbs): 87.2 lbs Visceral Fat Rating : 11    No data recorded Today's Visit #: 9  Starting Date: 08/17/22   HPI  Chief Complaint: OBESITY  Sarah Salas is here to discuss Sarah Salas progress with Sarah Salas obesity treatment plan. She is on the the Category 2 Plan and states she is following Sarah Salas eating plan approximately 50 % of the time. She states she is exercising 2 hrs 5 times per week.  Interval History:  Since last office visit she has 8 lb.  She reports fair adherence to reduced calorie nutritional plan. She has been working on  fruit and vegetables have decreased due to food insecurity  Denies problems with appetite and hunger signals.  Denies problems with satiety and satiation.  Denies problems with eating patterns and portion control.  Denies abnormal cravings. Denies feeling deprived or restricted.   Barriers identified: cost of healthy foods.   Pharmacotherapy for weight loss: She is currently taking no anti-obesity medication.    ASSESSMENT AND PLAN  TREATMENT PLAN FOR OBESITY:  Recommended Dietary Goals  Sarah Salas is currently in the action stage of change. As such, Sarah Salas goal is to continue weight management plan. She has agreed to: continue current plan  Behavioral Intervention  We discussed the following Behavioral Modification Strategies today: increasing lean protein intake, decreasing simple carbohydrates , increasing vegetables, increasing lower glycemic fruits, increasing  fiber rich foods, increasing water intake, planning for success, and try food finder app for food pantries .  Additional resources provided today:  Food finder App for pantries  Recommended Physical Activity Goals  Sarah Salas has been advised to work up to 150 minutes of moderate intensity aerobic activity a week and strengthening exercises 2-3 times per week for cardiovascular health, weight loss maintenance and preservation of muscle mass.   She has agreed to :  Continue current level of physical activity   Pharmacotherapy We discussed various medication options to help Sarah Salas with Sarah Salas weight loss efforts and we both agreed to :  Continue metformin for incretin effect and prevention of diabetes.  ASSOCIATED CONDITIONS ADDRESSED TODAY  Elevated LFTs Assessment & Plan: Sarah Salas most recent liver enzymes showed an improvement but she has had these wide fluctuations and is currently asymptomatic.  I had referred Sarah Salas to gastroenterology but appointment has not been scheduled. Sarah Salas highest  AST is 231 and ALT is 440.  Alkaline phosphatase is 131.  Sarah Salas liver enzymes had once improved after discontinuation of some of Sarah Salas psychotropic medication. She had negative iron studies and hepatitis serologies.  She had liver ultrasound that showed hepatic steatosis.  Sarah Salas fibrosis 4 Score = 1.86 (Indeterminate), so she needs further risk stratification.   She denies consumption of alcohol or use of over-the-counter supplements.  I think Sarah Salas psychiatric medications continue to change.  Because of elevations 2-3 times the upper limit of normal I will refer Sarah Salas to gastroenterology.  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 Considering  fibrosis score she may benefit from fibrotest .  She has no symptoms of autoimmune conditions so we will hold off autoimmune workup and will defer to gastroenterology if they feel is necessary.   Insulin resistance Assessment & Plan: Sarah Salas HOMA-IR is 3.15  which is elevated. Optimal level < 1.9. This is complex condition associated with genetics, ectopic fat and lifestyle factors. Insulin resistance may result in weight gain, abnormal cravings (particularly for carbs) and fatigue. This may result in additional weight gain and lead to pre-diabetes and diabetes if untreated.   Lab Results  Component Value Date   HGBA1C 5.6 11/04/2022   Lab Results  Component Value Date   INSULIN 13.6 11/04/2022   Lab Results  Component Value Date   GLUCOSE 92 02/22/2023   GLUCOSE 87 11/19/2017   She is currently on metformin XR 500 mg once a day for pharmacoprophylaxis and incretin effect.  She is tolerating medication well without any adverse effects.  She will continue current medication also eliminating or reducing simple added sugars in Sarah Salas diet.  Orders: -     metFORMIN HCl ER; Take 1 tablet (500 mg total) by mouth daily with breakfast.  Dispense: 30 tablet; Refill: 0  Class 2 severe obesity with serious comorbidity and body mass index (BMI) of 36.0 to 36.9 in adult, unspecified obesity type [facility]) Assessment & Plan: She has lost 32 pounds or 11% of baseline body weight.  Sarah Salas BIA shows a reduction in body fat but she is also lost some muscle at a ratio of about 5-1 which is what is expected.  She is doing strengthening and we discussed the importance of getting 25 to 40 g of protein with each of Sarah Salas meals.  She has food insecurity and was provided with a resource today.  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     PHYSICAL EXAM:  Blood pressure 102/67, pulse 81, temperature 97.8 F (36.6 C), height 5\' 9"  (1.753 m), weight 236 lb (107 kg), SpO2 97 %. Body mass index is 34.85 kg/m.  General: She is overweight, cooperative, alert, well developed, and in no acute distress. PSYCH: Has normal mood, affect and thought process.   HEENT: EOMI, sclerae are anicteric. Lungs: Normal breathing effort, no conversational  dyspnea. Extremities: No edema.  Neurologic: No gross sensory or motor deficits. No tremors or fasciculations noted.    DIAGNOSTIC DATA REVIEWED:  BMET    Component Value Date/Time   NA 143 02/22/2023 0744   K 4.8 02/22/2023 0744   CL 104 02/22/2023 0744   CO2 23 02/22/2023 0744   GLUCOSE 92 02/22/2023 0744   GLUCOSE 125 (H) 05/20/2022 0155   BUN 16 02/22/2023 0744   CREATININE 0.81 02/22/2023 0744   CALCIUM 10.1 02/22/2023 0744   GFRNONAA >60 05/20/2022 0155   GFRAA 101 11/25/2020 0919   Lab Results  Component Value Date   HGBA1C 5.6 11/04/2022   HGBA1C 5.3 12/26/2019   Lab Results  Component Value Date   INSULIN 13.6 11/04/2022   Lab Results  Component Value Date   TSH 1.250 01/13/2023   CBC    Component Value Date/Time   WBC 5.6 01/13/2023 1344   WBC 6.7 05/12/2022 0959   RBC 4.57 01/13/2023 1344   RBC 4.85 05/12/2022 0959   HGB 13.4 01/13/2023 1344   HCT 40.1 01/13/2023 1344   PLT 266 01/13/2023 1344   MCV 88 01/13/2023 1344   MCH 29.3 01/13/2023 1344   MCH 29.5 05/12/2022  0959   MCHC 33.4 01/13/2023 1344   MCHC 32.9 05/12/2022 0959   RDW 13.0 01/13/2023 1344   Iron Studies    Component Value Date/Time   IRON 68 11/18/2022 1117   TIBC 329 11/18/2022 1117   FERRITIN 62 11/18/2022 1117   IRONPCTSAT 21 11/18/2022 1117   Lipid Panel     Component Value Date/Time   CHOL 201 (H) 01/13/2023 1344   TRIG 138 01/13/2023 1344   HDL 47 01/13/2023 1344   CHOLHDL 4.3 01/13/2023 1344   LDLCALC 129 (H) 01/13/2023 1344   Hepatic Function Panel     Component Value Date/Time   PROT 6.8 02/22/2023 0744   ALBUMIN 4.8 02/22/2023 0744   AST 25 02/22/2023 0744   ALT 42 (H) 02/22/2023 0744   ALKPHOS 119 02/22/2023 0744   BILITOT 0.5 02/22/2023 0744      Component Value Date/Time   TSH 1.250 01/13/2023 1344   Nutritional Lab Results  Component Value Date   VD25OH 21.8 (L) 11/04/2022     Return in about 3 weeks (around 04/15/2023) for For Weight  Mangement with Dr. Rikki Spearing.Marland Kitchen She was informed of the importance of frequent follow up visits to maximize Sarah Salas success with intensive lifestyle modifications for Sarah Salas multiple health conditions.   ATTESTASTION STATEMENTS:  Reviewed by clinician on day of visit: allergies, medications, problem list, medical history, surgical history, family history, social history, and previous encounter notes.     Worthy Rancher, MD

## 2023-03-25 NOTE — Assessment & Plan Note (Signed)
Her most recent liver enzymes showed an improvement but she has had these wide fluctuations and is currently asymptomatic.  I had referred her to gastroenterology but appointment has not been scheduled. Her highest  AST is 231 and ALT is 440.  Alkaline phosphatase is 131.  Her liver enzymes had once improved after discontinuation of some of her psychotropic medication. She had negative iron studies and hepatitis serologies.  She had liver ultrasound that showed hepatic steatosis.  Her fibrosis 4 Score = 1.86 (Indeterminate), so she needs further risk stratification.   She denies consumption of alcohol or use of over-the-counter supplements.  I think her psychiatric medications continue to change.  Because of elevations 2-3 times the upper limit of normal I will refer her to gastroenterology.  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 Considering fibrosis score she may benefit from fibrotest .  She has no symptoms of autoimmune conditions so we will hold off autoimmune workup and will defer to gastroenterology if they feel is necessary.

## 2023-03-25 NOTE — Assessment & Plan Note (Signed)
She has lost 32 pounds or 11% of baseline body weight.  Her BIA shows a reduction in body fat but she is also lost some muscle at a ratio of about 5-1 which is what is expected.  She is doing strengthening and we discussed the importance of getting 25 to 40 g of protein with each of her meals.  She has food insecurity and was provided with a resource today.  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

## 2023-04-01 ENCOUNTER — Telehealth (HOSPITAL_COMMUNITY): Payer: Self-pay

## 2023-04-01 NOTE — Telephone Encounter (Signed)
Due to high liver enzymes I will not consider any other medication.  Oxazepam is safer and she may need to call the pharmacy to order.

## 2023-04-01 NOTE — Telephone Encounter (Signed)
Patient is calling because the sleep medication you gave her is on back order. Patient called multiple pharmacies and they are unable to get the medication until October. Patient is unsure of what to do because she can not sleep. Please review and advise, thank you

## 2023-04-19 ENCOUNTER — Ambulatory Visit (INDEPENDENT_AMBULATORY_CARE_PROVIDER_SITE_OTHER): Payer: Medicaid Other | Admitting: Internal Medicine

## 2023-04-19 ENCOUNTER — Encounter (INDEPENDENT_AMBULATORY_CARE_PROVIDER_SITE_OTHER): Payer: Self-pay | Admitting: Internal Medicine

## 2023-04-19 VITALS — BP 113/72 | HR 77 | Temp 98.4°F | Ht 69.0 in | Wt 230.0 lb

## 2023-04-19 DIAGNOSIS — Z6833 Body mass index (BMI) 33.0-33.9, adult: Secondary | ICD-10-CM

## 2023-04-19 DIAGNOSIS — E88819 Insulin resistance, unspecified: Secondary | ICD-10-CM

## 2023-04-19 DIAGNOSIS — R7989 Other specified abnormal findings of blood chemistry: Secondary | ICD-10-CM | POA: Diagnosis not present

## 2023-04-19 DIAGNOSIS — E559 Vitamin D deficiency, unspecified: Secondary | ICD-10-CM

## 2023-04-19 MED ORDER — METFORMIN HCL ER 500 MG PO TB24: 500.0000 mg | ORAL_TABLET | Freq: Every day | ORAL | 0 refills | Status: DC

## 2023-04-19 NOTE — Assessment & Plan Note (Signed)
She has lost 38 pounds or 13 % of baseline body weight.  Her BIA shows a reduction in body fat but she is also lost some muscle at a ratio of about 3-1 this time around.  She acknowledges not getting enough protein at times and we discussed economical sources of protein.  She is doing strengthening and we discussed the importance of getting 25 to 40 g of protein with each of her meals.  She has food insecurity at times.  She is very motivated and continues to make steady progress.

## 2023-04-19 NOTE — Assessment & Plan Note (Signed)
Most recent vitamin D levels  Lab Results  Component Value Date   VD25OH 21.8 (L) 11/04/2022     Deficiency state associated with adiposity and may result in leptin resistance, weight gain and fatigue. Currently on vitamin D supplementation without any adverse effects.  Plan: Check vitamin D levels today if replenished she will be transition to over-the-counter supplementation.

## 2023-04-19 NOTE — Progress Notes (Signed)
Office: [phone]  /  Fax: [phone]  WEIGHT SUMMARY AND BIOMETRICS  Vitals Temp: 98.4 F (36.9 C) BP: 113/72 Pulse Rate: 77 SpO2: 98 %   Anthropometric Measurements Height: 5\' 9"  (1.753 m) Weight: 230 lb (104.3 kg) BMI (Calculated): 33.95 Weight at Last Visit: 236lb Weight Lost Since Last Visit: 6lb Weight Gained Since Last Visit: 0lb Starting Weight: 268lb Total Weight Loss (lbs): 38 lb (17.2 kg)   Body Composition  Body Fat %: 44.7 % Fat Mass (lbs): 103.2 lbs Muscle Mass (lbs): 121.2 lbs Total Body Water (lbs): 85.6 lbs Visceral Fat Rating : 11    No data recorded Today's Visit #: 10  Starting Date: 08/17/22   HPI  Chief Complaint: OBESITY  Sarah Salas is here to discuss her progress with her obesity treatment plan. She is on the the Category 2 Plan and states she is following her eating plan approximately 90 % of the time. She states she is exercising 40-60 minutes 5 times per week.  Interval History:  Since last office visit she has lost 6 lbs pounds with decreased muscle mass 1:1 ratio.. She reports good adherence to reduced calorie nutritional plan. She has been working on not skipping meals, eating more fruits, eating more vegetables, drinking more water, continues to exercise, reducing portion sizes, mindfulness around eating, and controlling orexigenic cues and stimuli  Orixegenic Control: Reports problems with appetite and hunger signals.  Reports problems with satiety and satiation.  Reports problems with eating patterns and portion control.  Reports abnormal cravings. Denies feeling deprived or restricted.   Barriers identified:  family stress .   Pharmacotherapy for weight loss: She is currently taking Metformin (off label use for incretin effect and / or insulin resistance and / or diabetes prevention) with adequate clinical response  and without side effects..    ASSESSMENT AND PLAN  TREATMENT PLAN FOR OBESITY:  Recommended Dietary  Goals  Sarah Salas is currently in the action stage of change. As such, her goal is to continue weight management plan. She has agreed to: continue current plan  Behavioral Intervention  We discussed the following Behavioral Modification Strategies today: increasing lean protein intake, decreasing simple carbohydrates , increasing vegetables, increasing lower glycemic fruits, increasing fiber rich foods, avoiding skipping meals, increasing water intake, work on meal planning and preparation, continue to practice mindfulness when eating, and planning for success.  Additional resources provided today: None  Recommended Physical Activity Goals  Sarah Salas has been advised to work up to 150 minutes of moderate intensity aerobic activity a week and strengthening exercises 2-3 times per week for cardiovascular health, weight loss maintenance and preservation of muscle mass.   She has agreed to :  Continue current level of physical activity   Pharmacotherapy We discussed various medication options to help Sarah Salas with her weight loss efforts and we both agreed to : continue current anti-obesity medication regimen  ASSOCIATED CONDITIONS ADDRESSED TODAY  Elevated LFTs Assessment & Plan: Her most recent liver enzymes showed an improvement but she has had these wide fluctuations and is currently asymptomatic.  I had referred her to gastroenterology and appointment has been scheduled for October.  Her highest  AST is 231 and ALT is 440.  Alkaline phosphatase is 131.  Her liver enzymes had once improved after discontinuation of some of her psychotropic medication,  She had negative iron studies and hepatitis serologies.  She had liver ultrasound that showed hepatic steatosis.  Her fibrosis 4 Score = 1.86 (Indeterminate), so she needs further risk  stratification.   She denies consumption of alcohol or use of over-the-counter supplements.  Because of elevations 2-3 times the upper limit of normal I still want her to  see gastroenterology.  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 Considering fibrosis score she may benefit from fibrotest .  She has no symptoms of autoimmune conditions so we will hold off autoimmune workup and will defer to gastroenterology if they feel is necessary.   Insulin resistance Assessment & Plan: Her HOMA-IR is 3.15 which is elevated. Optimal level < 1.9. This is complex condition associated with genetics, ectopic fat and lifestyle factors. Insulin resistance may result in weight gain, abnormal cravings (particularly for carbs) and fatigue. This may result in additional weight gain and lead to pre-diabetes and diabetes if untreated.   Lab Results  Component Value Date   HGBA1C 5.6 11/04/2022   Lab Results  Component Value Date   INSULIN 13.6 11/04/2022   Lab Results  Component Value Date   GLUCOSE 92 02/22/2023   GLUCOSE 87 11/19/2017   She is currently on metformin XR 500 mg once a day for pharmacoprophylaxis and incretin effect.  She is tolerating medication well without any adverse effects.  She will continue current medication also eliminating or reducing simple added sugars in her diet.  Orders: -     metFORMIN HCl ER; Take 1 tablet (500 mg total) by mouth daily with breakfast.  Dispense: 30 tablet; Refill: 0  Vitamin D deficiency Assessment & Plan: Most recent vitamin D levels  Lab Results  Component Value Date   VD25OH 21.8 (L) 11/04/2022     Deficiency state associated with adiposity and may result in leptin resistance, weight gain and fatigue. Currently on vitamin D supplementation without any adverse effects.  Plan: Check vitamin D levels today if replenished she will be transition to over-the-counter supplementation.   Orders: -     VITAMIN D 25 Hydroxy (Vit-D Deficiency, Fractures)  Class 2 severe obesity with serious comorbidity and body mass index (BMI) of 36.0 to 36.9 in adult, unspecified obesity type  [facility]) Assessment & Plan: She has lost 38 pounds or 13 % of baseline body weight.  Her BIA shows a reduction in body fat but she is also lost some muscle at a ratio of about 3-1 this time around.  She acknowledges not getting enough protein at times and we discussed economical sources of protein.  She is doing strengthening and we discussed the importance of getting 25 to 40 g of protein with each of her meals.  She has food insecurity at times.  She is very motivated and continues to make steady progress.     PHYSICAL EXAM:  Blood pressure 113/72, pulse 77, temperature 98.4 F (36.9 C), height 5\' 9"  (1.753 m), weight 230 lb (104.3 kg), SpO2 98 %. Body mass index is 33.97 kg/m.  General: She is overweight, cooperative, alert, well developed, and in no acute distress. PSYCH: Has normal mood, affect and thought process.   HEENT: EOMI, sclerae are anicteric. Lungs: Normal breathing effort, no conversational dyspnea. Extremities: No edema.  Neurologic: No gross sensory or motor deficits. No tremors or fasciculations noted.    DIAGNOSTIC DATA REVIEWED:  BMET    Component Value Date/Time   NA 143 02/22/2023 0744   K 4.8 02/22/2023 0744   CL 104 02/22/2023 0744   CO2 23 02/22/2023 0744   GLUCOSE 92 02/22/2023 0744   GLUCOSE 125 (H) 05/20/2022 0155   BUN 16  02/22/2023 0744   CREATININE 0.81 02/22/2023 0744   CALCIUM 10.1 02/22/2023 0744   GFRNONAA >60 05/20/2022 0155   GFRAA 101 11/25/2020 0919   Lab Results  Component Value Date   HGBA1C 5.6 11/04/2022   HGBA1C 5.3 12/26/2019   Lab Results  Component Value Date   INSULIN 13.6 11/04/2022   Lab Results  Component Value Date   TSH 1.250 01/13/2023   CBC    Component Value Date/Time   WBC 5.6 01/13/2023 1344   WBC 6.7 05/12/2022 0959   RBC 4.57 01/13/2023 1344   RBC 4.85 05/12/2022 0959   HGB 13.4 01/13/2023 1344   HCT 40.1 01/13/2023 1344   PLT 266 01/13/2023 1344   MCV 88 01/13/2023 1344   MCH 29.3 01/13/2023 1344    MCH 29.5 05/12/2022 0959   MCHC 33.4 01/13/2023 1344   MCHC 32.9 05/12/2022 0959   RDW 13.0 01/13/2023 1344   Iron Studies    Component Value Date/Time   IRON 68 11/18/2022 1117   TIBC 329 11/18/2022 1117   FERRITIN 62 11/18/2022 1117   IRONPCTSAT 21 11/18/2022 1117   Lipid Panel     Component Value Date/Time   CHOL 201 (H) 01/13/2023 1344   TRIG 138 01/13/2023 1344   HDL 47 01/13/2023 1344   CHOLHDL 4.3 01/13/2023 1344   LDLCALC 129 (H) 01/13/2023 1344   Hepatic Function Panel     Component Value Date/Time   PROT 6.8 02/22/2023 0744   ALBUMIN 4.8 02/22/2023 0744   AST 25 02/22/2023 0744   ALT 42 (H) 02/22/2023 0744   ALKPHOS 119 02/22/2023 0744   BILITOT 0.5 02/22/2023 0744      Component Value Date/Time   TSH 1.250 01/13/2023 1344   Nutritional Lab Results  Component Value Date   VD25OH 21.8 (L) 11/04/2022     Return in about 3 weeks (around 05/10/2023) for For Weight Mangement with Dr. Rikki Spearing.Marland Kitchen She was informed of the importance of frequent follow up visits to maximize her success with intensive lifestyle modifications for her multiple health conditions.   ATTESTASTION STATEMENTS:  Reviewed by clinician on day of visit: allergies, medications, problem list, medical history, surgical history, family history, social history, and previous encounter notes.     Worthy Rancher, MD

## 2023-04-19 NOTE — Assessment & Plan Note (Signed)
Her HOMA-IR is 3.15 which is elevated. Optimal level < 1.9. This is complex condition associated with genetics, ectopic fat and lifestyle factors. Insulin resistance may result in weight gain, abnormal cravings (particularly for carbs) and fatigue. This may result in additional weight gain and lead to pre-diabetes and diabetes if untreated.   Lab Results  Component Value Date   HGBA1C 5.6 11/04/2022   Lab Results  Component Value Date   INSULIN 13.6 11/04/2022   Lab Results  Component Value Date   GLUCOSE 92 02/22/2023   GLUCOSE 87 11/19/2017   She is currently on metformin XR 500 mg once a day for pharmacoprophylaxis and incretin effect.  She is tolerating medication well without any adverse effects.  She will continue current medication also eliminating or reducing simple added sugars in her diet. 

## 2023-04-19 NOTE — Assessment & Plan Note (Signed)
Her most recent liver enzymes showed an improvement but she has had these wide fluctuations and is currently asymptomatic.  I had referred her to gastroenterology and appointment has been scheduled for October.  Her highest  AST is 231 and ALT is 440.  Alkaline phosphatase is 131.  Her liver enzymes had once improved after discontinuation of some of her psychotropic medication,  She had negative iron studies and hepatitis serologies.  She had liver ultrasound that showed hepatic steatosis.  Her fibrosis 4 Score = 1.86 (Indeterminate), so she needs further risk stratification.   She denies consumption of alcohol or use of over-the-counter supplements.  Because of elevations 2-3 times the upper limit of normal I still want her to see gastroenterology.  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 Considering fibrosis score she may benefit from fibrotest .  She has no symptoms of autoimmune conditions so we will hold off autoimmune workup and will defer to gastroenterology if they feel is necessary.

## 2023-04-20 LAB — VITAMIN D 25 HYDROXY (VIT D DEFICIENCY, FRACTURES): Vit D, 25-Hydroxy: 49.8 ng/mL (ref 30.0–100.0)

## 2023-04-27 ENCOUNTER — Telehealth (HOSPITAL_COMMUNITY): Payer: Self-pay | Admitting: *Deleted

## 2023-04-27 ENCOUNTER — Other Ambulatory Visit (HOSPITAL_COMMUNITY): Payer: Self-pay | Admitting: *Deleted

## 2023-04-27 MED ORDER — TEMAZEPAM 15 MG PO CAPS: 15.0000 mg | ORAL_CAPSULE | Freq: Every evening | ORAL | 0 refills | Status: DC | PRN

## 2023-04-27 NOTE — Telephone Encounter (Signed)
Writer spoke with pt who called and was very upset due to family issues and possibly "being homeless" soon. Pt says she's not been sleeping due to not being abe to fill the Serax as it is on backorder at [facility]. Writer advised pt to call [facility], [facility], and other local pharmacies to see where it may be available. Pt will call writer back to update. FYI.

## 2023-04-27 NOTE — Telephone Encounter (Signed)
Writer called pt Pharmacy, [facility] on [address], to give VO for the Temazepam 15 mg at bedtime. This nurse was advised that all controls must be e-scribed to them. Please send. Thank you.

## 2023-04-27 NOTE — Telephone Encounter (Signed)
Done

## 2023-04-27 NOTE — Telephone Encounter (Signed)
Please provide resources for housing and shelters.  If any suicidal thoughts or homicidal thoughts then need to go to the emergency room.  Still not able to get Serax then we can try temazepam 15 mg at bedtime.  Her last liver enzymes were much better.

## 2023-04-27 NOTE — Telephone Encounter (Signed)
Pt denied SI presently. Will f/u.

## 2023-04-28 ENCOUNTER — Encounter (INDEPENDENT_AMBULATORY_CARE_PROVIDER_SITE_OTHER): Payer: Self-pay | Admitting: Internal Medicine

## 2023-05-10 ENCOUNTER — Encounter: Payer: Self-pay | Admitting: Nurse Practitioner

## 2023-05-10 ENCOUNTER — Ambulatory Visit (INDEPENDENT_AMBULATORY_CARE_PROVIDER_SITE_OTHER): Payer: Medicaid Other | Admitting: Internal Medicine

## 2023-05-10 ENCOUNTER — Encounter (INDEPENDENT_AMBULATORY_CARE_PROVIDER_SITE_OTHER): Payer: Self-pay | Admitting: Internal Medicine

## 2023-05-10 VITALS — BP 117/82 | HR 78 | Temp 98.3°F | Ht 69.0 in | Wt 228.0 lb

## 2023-05-10 DIAGNOSIS — E559 Vitamin D deficiency, unspecified: Secondary | ICD-10-CM

## 2023-05-10 DIAGNOSIS — K760 Fatty (change of) liver, not elsewhere classified: Secondary | ICD-10-CM | POA: Diagnosis not present

## 2023-05-10 DIAGNOSIS — E88819 Insulin resistance, unspecified: Secondary | ICD-10-CM | POA: Diagnosis not present

## 2023-05-10 DIAGNOSIS — Z6833 Body mass index (BMI) 33.0-33.9, adult: Secondary | ICD-10-CM | POA: Diagnosis not present

## 2023-05-10 MED ORDER — ERGOCALCIFEROL 1.25 MG (50000 UT) PO CAPS
50000.0000 [IU] | ORAL_CAPSULE | ORAL | 0 refills | Status: DC
Start: 2023-05-10 — End: 2023-07-19

## 2023-05-10 MED ORDER — METFORMIN HCL ER 500 MG PO TB24
500.0000 mg | ORAL_TABLET | Freq: Two times a day (BID) | ORAL | 0 refills | Status: DC
Start: 2023-05-10 — End: 2023-06-07

## 2023-05-10 NOTE — Assessment & Plan Note (Signed)
Her vitamin D levels have been replenished.  She has difficulty affording vitamin D3 supplementation.  We will therefore continue with prescription strength vitamin D but will reduce to every other week.

## 2023-05-10 NOTE — Progress Notes (Signed)
Office: [phone]  /  Fax: [phone]  WEIGHT SUMMARY AND BIOMETRICS  Vitals Temp: 98.3 F (36.8 C) BP: 117/82 Pulse Rate: 78 SpO2: 97 %   Anthropometric Measurements Height: 5\' 9"  (1.753 m) Weight: 228 lb (103.4 kg) BMI (Calculated): 33.65 Weight at Last Visit: 230 lb Weight Lost Since Last Visit: 2 lb Weight Gained Since Last Visit: 0 lb Starting Weight: 268 lb Total Weight Loss (lbs): 40 lb (18.1 kg) Peak Weight: 450 lb   Body Composition  Body Fat %: 44.1 % Fat Mass (lbs): 101 lbs Muscle Mass (lbs): 121.4 lbs Total Body Water (lbs): 86.8 lbs Visceral Fat Rating : 10    No data recorded Today's Visit #: 11  Starting Date: 08/17/22   HPI  Chief Complaint: OBESITY  Sarah Salas is here to discuss her progress with her obesity treatment plan. She is on the the Category 2 Plan and states she is following her eating plan approximately 85 % of the time. She states she is exercising 90 minutes 5 times per week.  Interval History:  Since last office visit she has lost 2 pounds. She reports good adherence to reduced calorie nutritional plan. She has been working on reading food labels, not skipping meals, increasing protein intake at every meal, eating more fruits, eating more vegetables, drinking more water, and continues to exercise  Orixegenic Control: Denies problems with appetite and hunger signals.  Denies problems with satiety and satiation.  Denies problems with eating patterns and portion control.  Denies abnormal cravings. Denies feeling deprived or restricted.   Barriers identified: cost of healthy foods and presence of obesogenic drugs.   Pharmacotherapy for weight loss: She is currently taking Metformin (off label use for incretin effect and / or insulin resistance and / or diabetes prevention) with adequate clinical response  and without side effects..    ASSESSMENT AND PLAN  TREATMENT PLAN FOR OBESITY:  Recommended Dietary Goals  Avaiyah is  currently in the action stage of change. As such, her goal is to continue weight management plan. She has agreed to: continue current plan  Behavioral Intervention  We discussed the following Behavioral Modification Strategies today: increasing lean protein intake, decreasing simple carbohydrates , increasing vegetables, increasing lower glycemic fruits, increasing fiber rich foods, increasing water intake, avoiding temptations and identifying enticing environmental cues, continue to practice mindfulness when eating, and planning for success.  Additional resources provided today: None  Recommended Physical Activity Goals  Merlyn has been advised to work up to 150 minutes of moderate intensity aerobic activity a week and strengthening exercises 2-3 times per week for cardiovascular health, weight loss maintenance and preservation of muscle mass.   She has agreed to :  Continue current level of physical activity   Pharmacotherapy We discussed various medication options to help Camary with her weight loss efforts and we both agreed to : increase metformin 500 mg XR to twice daily for incretin effect.  ASSOCIATED CONDITIONS ADDRESSED TODAY  Class 2 severe obesity with serious comorbidity and body mass index (BMI) of 36.0 to 36.9 in adult, unspecified obesity type [facility]) Assessment & Plan: She has lost 40 pounds or 15 % of baseline body weight.  Her BIA shows a reduction in body fat but she is also lost some muscle at a ratio of about 1:5 this time around.  She is doing strengthening and we discussed the importance of getting 25 to 40 g of protein with each of her meals. She is very motivated and continues to  make steady progress.   Insulin resistance Assessment & Plan: Her HOMA-IR is 3.15 which is elevated. Optimal level < 1.9. This is complex condition associated with genetics, ectopic fat and lifestyle factors. Insulin resistance may result in weight gain, abnormal cravings (particularly for  carbs) and fatigue. This may result in additional weight gain and lead to pre-diabetes and diabetes if untreated.   Lab Results  Component Value Date   HGBA1C 5.6 11/04/2022   Lab Results  Component Value Date   INSULIN 13.6 11/04/2022   Lab Results  Component Value Date   GLUCOSE 92 02/22/2023   GLUCOSE 87 11/19/2017   She is currently on metformin XR 500 mg once a day for pharmacoprophylaxis and incretin effect.  She is tolerating medication well without any adverse effects.  We will increase metformin to twice a day for added incretin effect  Orders: -     metFORMIN HCl ER; Take 1 tablet (500 mg total) by mouth 2 (two) times daily with a meal.  Dispense: 60 tablet; Refill: 0  Vitamin D deficiency Assessment & Plan: Her vitamin D levels have been replenished.  She has difficulty affording vitamin D3 supplementation.  We will therefore continue with prescription strength vitamin D but will reduce to every other week.  Orders: -     Ergocalciferol; Take 1 capsule (50,000 Units total) by mouth once a week.  Dispense: 12 capsule; Refill: 0  Metabolic dysfunction-associated steatotic liver disease (MASLD) Assessment & Plan: Her most recent liver enzymes showed an improvement but she has had these wide fluctuations and is currently asymptomatic.  I had referred her to gastroenterology and appointment has been scheduled for October.  Her highest  AST is 231 and ALT is 440.  Alkaline phosphatase is 131.  Her liver enzymes had once improved after discontinuation of some of her psychotropic medication,  She had negative iron studies and hepatitis serologies.  She had liver ultrasound that showed hepatic steatosis.  Her fibrosis 4 Score = 1.86 (Indeterminate), so she needs further risk stratification.   She denies consumption of alcohol or use of over-the-counter supplements.  Because of elevations 2-3 times the upper limit of normal I still want her to see gastroenterology.  This is likely  drug induced liver injury with underlying hepatic steatosis. Considering fibrosis score she may benefit from fibrotest .  She has no symptoms of autoimmune conditions so we will hold off autoimmune workup and will defer to gastroenterology if they feel is necessary.  She may benefit from incretin therapy.  She has lost 15% of total body weight thus far.     PHYSICAL EXAM:  Blood pressure 117/82, pulse 78, temperature 98.3 F (36.8 C), height 5\' 9"  (1.753 m), weight 228 lb (103.4 kg), SpO2 97%. Body mass index is 33.67 kg/m.  General: She is overweight, cooperative, alert, well developed, and in no acute distress. PSYCH: Has normal mood, affect and thought process.   HEENT: EOMI, sclerae are anicteric. Lungs: Normal breathing effort, no conversational dyspnea. Extremities: No edema.  Neurologic: No gross sensory or motor deficits. No tremors or fasciculations noted.    DIAGNOSTIC DATA REVIEWED:  BMET    Component Value Date/Time   NA 143 02/22/2023 0744   K 4.8 02/22/2023 0744   CL 104 02/22/2023 0744   CO2 23 02/22/2023 0744   GLUCOSE 92 02/22/2023 0744   GLUCOSE 125 (H) 05/20/2022 0155   BUN 16 02/22/2023 0744   CREATININE 0.81 02/22/2023 0744   CALCIUM 10.1 02/22/2023 0744  GFRNONAA >60 05/20/2022 0155   GFRAA 101 11/25/2020 0919   Lab Results  Component Value Date   HGBA1C 5.6 11/04/2022   HGBA1C 5.3 12/26/2019   Lab Results  Component Value Date   INSULIN 13.6 11/04/2022   Lab Results  Component Value Date   TSH 1.250 01/13/2023   CBC    Component Value Date/Time   WBC 5.6 01/13/2023 1344   WBC 6.7 05/12/2022 0959   RBC 4.57 01/13/2023 1344   RBC 4.85 05/12/2022 0959   HGB 13.4 01/13/2023 1344   HCT 40.1 01/13/2023 1344   PLT 266 01/13/2023 1344   MCV 88 01/13/2023 1344   MCH 29.3 01/13/2023 1344   MCH 29.5 05/12/2022 0959   MCHC 33.4 01/13/2023 1344   MCHC 32.9 05/12/2022 0959   RDW 13.0 01/13/2023 1344   Iron Studies    Component Value  Date/Time   IRON 68 11/18/2022 1117   TIBC 329 11/18/2022 1117   FERRITIN 62 11/18/2022 1117   IRONPCTSAT 21 11/18/2022 1117   Lipid Panel     Component Value Date/Time   CHOL 201 (H) 01/13/2023 1344   TRIG 138 01/13/2023 1344   HDL 47 01/13/2023 1344   CHOLHDL 4.3 01/13/2023 1344   LDLCALC 129 (H) 01/13/2023 1344   Hepatic Function Panel     Component Value Date/Time   PROT 6.8 02/22/2023 0744   ALBUMIN 4.8 02/22/2023 0744   AST 25 02/22/2023 0744   ALT 42 (H) 02/22/2023 0744   ALKPHOS 119 02/22/2023 0744   BILITOT 0.5 02/22/2023 0744      Component Value Date/Time   TSH 1.250 01/13/2023 1344   Nutritional Lab Results  Component Value Date   VD25OH 49.8 04/19/2023   VD25OH 21.8 (L) 11/04/2022     Return in about 4 weeks (around 06/07/2023) for For Weight Mangement with Dr. Rikki Spearing.Marland Kitchen She was informed of the importance of frequent follow up visits to maximize her success with intensive lifestyle modifications for her multiple health conditions.   ATTESTASTION STATEMENTS:  Reviewed by clinician on day of visit: allergies, medications, problem list, medical history, surgical history, family history, social history, and previous encounter notes.     Worthy Rancher, MD

## 2023-05-10 NOTE — Assessment & Plan Note (Signed)
Her most recent liver enzymes showed an improvement but she has had these wide fluctuations and is currently asymptomatic.  I had referred her to gastroenterology and appointment has been scheduled for October.  Her highest  AST is 231 and ALT is 440.  Alkaline phosphatase is 131.  Her liver enzymes had once improved after discontinuation of some of her psychotropic medication,  She had negative iron studies and hepatitis serologies.  She had liver ultrasound that showed hepatic steatosis.  Her fibrosis 4 Score = 1.86 (Indeterminate), so she needs further risk stratification.   She denies consumption of alcohol or use of over-the-counter supplements.  Because of elevations 2-3 times the upper limit of normal I still want her to see gastroenterology.  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 Considering fibrosis score she may benefit from fibrotest .  She has no symptoms of autoimmune conditions so we will hold off autoimmune workup and will defer to gastroenterology if they feel is necessary.  She may benefit from incretin therapy.  She has lost 15% of total body weight thus far.

## 2023-05-10 NOTE — Assessment & Plan Note (Signed)
She has lost 40 pounds or 15 % of baseline body weight.  Her BIA shows a reduction in body fat but she is also lost some muscle at a ratio of about 1:5 this time around.  She is doing strengthening and we discussed the importance of getting 25 to 40 g of protein with each of her meals. She is very motivated and continues to make steady progress.

## 2023-05-10 NOTE — Assessment & Plan Note (Signed)
Her HOMA-IR is 3.15 which is elevated. Optimal level < 1.9. This is complex condition associated with genetics, ectopic fat and lifestyle factors. Insulin resistance may result in weight gain, abnormal cravings (particularly for carbs) and fatigue. This may result in additional weight gain and lead to pre-diabetes and diabetes if untreated.   Lab Results  Component Value Date   HGBA1C 5.6 11/04/2022   Lab Results  Component Value Date   INSULIN 13.6 11/04/2022   Lab Results  Component Value Date   GLUCOSE 92 02/22/2023   GLUCOSE 87 11/19/2017   She is currently on metformin XR 500 mg once a day for pharmacoprophylaxis and incretin effect.  She is tolerating medication well without any adverse effects.  We will increase metformin to twice a day for added incretin effect

## 2023-05-11 ENCOUNTER — Encounter (HOSPITAL_BASED_OUTPATIENT_CLINIC_OR_DEPARTMENT_OTHER): Payer: Self-pay | Admitting: Orthopaedic Surgery

## 2023-05-12 ENCOUNTER — Other Ambulatory Visit (HOSPITAL_BASED_OUTPATIENT_CLINIC_OR_DEPARTMENT_OTHER): Payer: Self-pay | Admitting: Orthopaedic Surgery

## 2023-05-12 ENCOUNTER — Other Ambulatory Visit (HOSPITAL_BASED_OUTPATIENT_CLINIC_OR_DEPARTMENT_OTHER): Payer: Self-pay

## 2023-05-12 MED ORDER — MELOXICAM 15 MG PO TABS: 15.0000 mg | ORAL_TABLET | Freq: Every day | ORAL | 0 refills | Status: DC

## 2023-05-12 MED ORDER — METHOCARBAMOL 500 MG PO TABS: 500.0000 mg | ORAL_TABLET | Freq: Four times a day (QID) | ORAL | 0 refills | Status: AC

## 2023-05-18 ENCOUNTER — Telehealth (HOSPITAL_COMMUNITY): Payer: Medicaid Other | Admitting: Psychiatry

## 2023-05-18 ENCOUNTER — Other Ambulatory Visit (HOSPITAL_COMMUNITY): Payer: Self-pay

## 2023-05-24 ENCOUNTER — Other Ambulatory Visit (INDEPENDENT_AMBULATORY_CARE_PROVIDER_SITE_OTHER): Payer: Self-pay | Admitting: Internal Medicine

## 2023-05-24 ENCOUNTER — Encounter: Payer: Medicaid Other | Admitting: Obstetrics & Gynecology

## 2023-05-24 DIAGNOSIS — E88819 Insulin resistance, unspecified: Secondary | ICD-10-CM

## 2023-05-31 NOTE — Progress Notes (Signed)
Canceled appointment.

## 2023-06-07 ENCOUNTER — Ambulatory Visit (INDEPENDENT_AMBULATORY_CARE_PROVIDER_SITE_OTHER): Payer: Medicaid Other | Admitting: Internal Medicine

## 2023-06-07 ENCOUNTER — Encounter (INDEPENDENT_AMBULATORY_CARE_PROVIDER_SITE_OTHER): Payer: Self-pay | Admitting: Internal Medicine

## 2023-06-07 VITALS — BP 104/71 | HR 83 | Temp 98.4°F | Ht 69.0 in | Wt 228.0 lb

## 2023-06-07 DIAGNOSIS — E88819 Insulin resistance, unspecified: Secondary | ICD-10-CM

## 2023-06-07 DIAGNOSIS — K760 Fatty (change of) liver, not elsewhere classified: Secondary | ICD-10-CM

## 2023-06-07 DIAGNOSIS — Z6833 Body mass index (BMI) 33.0-33.9, adult: Secondary | ICD-10-CM | POA: Diagnosis not present

## 2023-06-07 MED ORDER — METFORMIN HCL ER 500 MG PO TB24
500.0000 mg | ORAL_TABLET | Freq: Two times a day (BID) | ORAL | 0 refills | Status: DC
Start: 2023-06-07 — End: 2023-08-12

## 2023-06-07 NOTE — Assessment & Plan Note (Signed)
She has maintained this time around but overall lost 40 pounds or 15 % of baseline body weight.  Her BIA shows a reduction in body fat but an increase in muscle mass she is doing strengthening and we discussed the importance of getting 25 to 40 g of protein with each of her meals. She is very motivated and continues to make steady progress.  She is on metformin for incretin effect that she may be a candidate for [facility].  I provided her with information on plateauing.  We also discussed how weight loss is not linear and that weight loss rate varies from time to time.

## 2023-06-07 NOTE — Assessment & Plan Note (Addendum)
Her most recent liver enzymes showed an improvement but she has had these wide fluctuations and is currently asymptomatic.  I had referred her to gastroenterology and appointment has been scheduled for October.  Her highest  AST is 231 and ALT is 440.  Alkaline phosphatase is 131.  Her liver enzymes had once improved after discontinuation of some of her psychotropic medication,  She had negative iron studies and hepatitis serologies.  She had liver ultrasound that showed hepatic steatosis.  Her fibrosis 4 Score = 1.86 (Indeterminate), so she needs further risk stratification.   She denies consumption of alcohol or use of over-the-counter supplements.  Because of elevations 2-3 times the upper limit of normal I still want her to see gastroenterology.  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 Considering fibrosis score she may benefit from fibrotest .  She has no symptoms of autoimmune conditions so we will hold off autoimmune workup and will defer to gastroenterology if they feel is necessary.  She may benefit from incretin therapy.  She has lost 15% of total body weight thus far.  She has now coverage for incretin therapy so we may consider starting semaglutide when she reaches a plateau.

## 2023-06-07 NOTE — Assessment & Plan Note (Signed)
Her HOMA-IR is 3.15 which is elevated. Optimal level < 1.9. This is complex condition associated with genetics, ectopic fat and lifestyle factors. Insulin resistance may result in weight gain, abnormal cravings (particularly for carbs) and fatigue. This may result in additional weight gain and lead to pre-diabetes and diabetes if untreated.   Lab Results  Component Value Date   HGBA1C 5.6 11/04/2022   Lab Results  Component Value Date   INSULIN 13.6 11/04/2022   Lab Results  Component Value Date   GLUCOSE 92 02/22/2023   GLUCOSE 87 11/19/2017   She is currently on metformin XR 500 mg twice a day for pharmacoprophylaxis.  She denies any adverse effects.  She will continue current regimen.  We will repeat labs in the fall and check B12 levels as she is on metformin.

## 2023-06-07 NOTE — Progress Notes (Signed)
Office: [phone]  /  Fax: [phone]  WEIGHT SUMMARY AND BIOMETRICS  Vitals Temp: 98.4 F (36.9 C) BP: 104/71 Pulse Rate: 83 SpO2: 97 %   Anthropometric Measurements Height: 5\' 9"  (1.753 m) Weight: 228 lb (103.4 kg) BMI (Calculated): 33.65 Weight at Last Visit: 228 lb Weight Lost Since Last Visit: 0 lb Weight Gained Since Last Visit: 0lb Starting Weight: 268 lb Total Weight Loss (lbs): 40 lb (18.1 kg) Peak Weight: 450 lb   Body Composition  Body Fat %: 42.9 % Fat Mass (lbs): 97.8 lbs Muscle Mass (lbs): 123.8 lbs Total Body Water (lbs): 84.2 lbs Visceral Fat Rating : 10    No data recorded Today's Visit #: 12  Starting Date: 08/17/22   HPI  Chief Complaint: OBESITY  Sarah Salas is here to discuss her progress with her obesity treatment plan. She is on the the Category 2 Plan and states she is following her eating plan approximately 85 % of the time. She states she is exercising 120 minutes 5 times per week.  Interval History:  Since last office visit she has maintained. BIA shows a reduction in BF and increase muscle mass.  She reports good adherence to reduced calorie nutritional plan. She has been working on not skipping meals, increasing protein intake at every meal, eating more fruits, eating more vegetables, drinking more water, continues to exercise, and eating out less  Orixegenic Control: Denies problems with appetite and hunger signals.  Denies problems with satiety and satiation.  Denies problems with eating patterns and portion control.  Reports abnormal cravings. Denies feeling deprived or restricted.   Barriers identified: medical comorbidities.   Pharmacotherapy for weight loss: She is currently taking Metformin (off label use for incretin effect and / or insulin resistance and / or diabetes prevention) with adequate clinical response  and without side effects..    ASSESSMENT AND PLAN  TREATMENT PLAN FOR OBESITY:  Recommended Dietary  Goals  Sarah Salas is currently in the action stage of change. As such, her goal is to continue weight management plan. She has agreed to: continue current plan  Behavioral Intervention  We discussed the following Behavioral Modification Strategies today: increasing lean protein intake, decreasing simple carbohydrates , increasing vegetables, increasing lower glycemic fruits, increasing fiber rich foods, increasing water intake, continue to practice mindfulness when eating, and planning for success.  Additional resources provided today:  HO on plateuing  Recommended Physical Activity Goals  Sarah Salas has been advised to work up to 150 minutes of moderate intensity aerobic activity a week and strengthening exercises 2-3 times per week for cardiovascular health, weight loss maintenance and preservation of muscle mass.   She has agreed to :  Continue current level of physical activity   Pharmacotherapy We discussed various medication options to help Sarah Salas with her weight loss efforts and we both agreed to : continue current anti-obesity medication regimen  ASSOCIATED CONDITIONS ADDRESSED TODAY  Class 2 severe obesity with serious comorbidity and body mass index (BMI) of 36.0 to 36.9 in adult, unspecified obesity type [name]) Assessment & Plan: She has maintained this time around but overall lost 40 pounds or 15 % of baseline body weight.  Her BIA shows a reduction in body fat but an increase in muscle mass she is doing strengthening and we discussed the importance of getting 25 to 40 g of protein with each of her meals. She is very motivated and continues to make steady progress.  She is on metformin for incretin effect that she may  be a candidate for [facility].  I provided her with information on plateauing.  We also discussed how weight loss is not linear and that weight loss rate varies from time to time.   Insulin resistance Assessment & Plan: Her HOMA-IR is 3.15 which is elevated. Optimal level <  1.9. This is complex condition associated with genetics, ectopic fat and lifestyle factors. Insulin resistance may result in weight gain, abnormal cravings (particularly for carbs) and fatigue. This may result in additional weight gain and lead to pre-diabetes and diabetes if untreated.   Lab Results  Component Value Date   HGBA1C 5.6 11/04/2022   Lab Results  Component Value Date   INSULIN 13.6 11/04/2022   Lab Results  Component Value Date   GLUCOSE 92 02/22/2023   GLUCOSE 87 11/19/2017   She is currently on metformin XR 500 mg twice a day for pharmacoprophylaxis.  She denies any adverse effects.  She will continue current regimen.  We will repeat labs in the fall and check B12 levels as she is on metformin.  Orders: -     metFORMIN HCl ER; Take 1 tablet (500 mg total) by mouth 2 (two) times daily with a meal.  Dispense: 180 tablet; Refill: 0  Metabolic dysfunction-associated steatotic liver disease (MASLD) Assessment & Plan: Her most recent liver enzymes showed an improvement but she has had these wide fluctuations and is currently asymptomatic.  I had referred her to gastroenterology and appointment has been scheduled for October.  Her highest  AST is 231 and ALT is 440.  Alkaline phosphatase is 131.  Her liver enzymes had once improved after discontinuation of some of her psychotropic medication,  She had negative iron studies and hepatitis serologies.  She had liver ultrasound that showed hepatic steatosis.  Her fibrosis 4 Score = 1.86 (Indeterminate), so she needs further risk stratification.   She denies consumption of alcohol or use of over-the-counter supplements.  Because of elevations 2-3 times the upper limit of normal I still want her to see gastroenterology.  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 Considering fibrosis score she may benefit from fibrotest .  She has no symptoms of autoimmune conditions so we will hold off autoimmune workup  and will defer to gastroenterology if they feel is necessary.  She may benefit from incretin therapy.  She has lost 15% of total body weight thus far.  She has now coverage for incretin therapy so we may consider starting semaglutide when she reaches a plateau.     PHYSICAL EXAM:  Blood pressure 104/71, pulse 83, temperature 98.4 F (36.9 C), height 5\' 9"  (1.753 m), weight 228 lb (103.4 kg), SpO2 97%. Body mass index is 33.67 kg/m.  General: She is overweight, cooperative, alert, well developed, and in no acute distress. PSYCH: Has normal mood, affect and thought process.   HEENT: EOMI, sclerae are anicteric. Lungs: Normal breathing effort, no conversational dyspnea. Extremities: No edema.  Neurologic: No gross sensory or motor deficits. No tremors or fasciculations noted.    DIAGNOSTIC DATA REVIEWED:  BMET    Component Value Date/Time   NA 143 02/22/2023 0744   K 4.8 02/22/2023 0744   CL 104 02/22/2023 0744   CO2 23 02/22/2023 0744   GLUCOSE 92 02/22/2023 0744   GLUCOSE 125 (H) 05/20/2022 0155   BUN 16 02/22/2023 0744   CREATININE 0.81 02/22/2023 0744   CALCIUM 10.1 02/22/2023 0744   GFRNONAA >60 05/20/2022 0155   GFRAA 101 11/25/2020 0919  Lab Results  Component Value Date   HGBA1C 5.6 11/04/2022   HGBA1C 5.3 12/26/2019   Lab Results  Component Value Date   INSULIN 13.6 11/04/2022   Lab Results  Component Value Date   TSH 1.250 01/13/2023   CBC    Component Value Date/Time   WBC 5.6 01/13/2023 1344   WBC 6.7 05/12/2022 0959   RBC 4.57 01/13/2023 1344   RBC 4.85 05/12/2022 0959   HGB 13.4 01/13/2023 1344   HCT 40.1 01/13/2023 1344   PLT 266 01/13/2023 1344   MCV 88 01/13/2023 1344   MCH 29.3 01/13/2023 1344   MCH 29.5 05/12/2022 0959   MCHC 33.4 01/13/2023 1344   MCHC 32.9 05/12/2022 0959   RDW 13.0 01/13/2023 1344   Iron Studies    Component Value Date/Time   IRON 68 11/18/2022 1117   TIBC 329 11/18/2022 1117   FERRITIN 62 11/18/2022 1117    IRONPCTSAT 21 11/18/2022 1117   Lipid Panel     Component Value Date/Time   CHOL 201 (H) 01/13/2023 1344   TRIG 138 01/13/2023 1344   HDL 47 01/13/2023 1344   CHOLHDL 4.3 01/13/2023 1344   LDLCALC 129 (H) 01/13/2023 1344   Hepatic Function Panel     Component Value Date/Time   PROT 6.8 02/22/2023 0744   ALBUMIN 4.8 02/22/2023 0744   AST 25 02/22/2023 0744   ALT 42 (H) 02/22/2023 0744   ALKPHOS 119 02/22/2023 0744   BILITOT 0.5 02/22/2023 0744      Component Value Date/Time   TSH 1.250 01/13/2023 1344   Nutritional Lab Results  Component Value Date   VD25OH 49.8 04/19/2023   VD25OH 21.8 (L) 11/04/2022     Return for Week of Sept 30th - 6 weeks - with Dr. Rikki Spearing.Marland Kitchen She was informed of the importance of frequent follow up visits to maximize her success with intensive lifestyle modifications for her multiple health conditions.   ATTESTASTION STATEMENTS:  Reviewed by clinician on day of visit: allergies, medications, problem list, medical history, surgical history, family history, social history, and previous encounter notes.     Worthy Rancher, MD

## 2023-06-15 ENCOUNTER — Other Ambulatory Visit (HOSPITAL_COMMUNITY): Payer: Self-pay | Admitting: Psychiatry

## 2023-06-15 DIAGNOSIS — F902 Attention-deficit hyperactivity disorder, combined type: Secondary | ICD-10-CM

## 2023-06-15 DIAGNOSIS — F419 Anxiety disorder, unspecified: Secondary | ICD-10-CM

## 2023-06-24 ENCOUNTER — Encounter (HOSPITAL_COMMUNITY): Payer: Self-pay | Admitting: Psychiatry

## 2023-06-24 ENCOUNTER — Telehealth (HOSPITAL_BASED_OUTPATIENT_CLINIC_OR_DEPARTMENT_OTHER): Payer: Medicaid Other | Admitting: Psychiatry

## 2023-06-24 VITALS — Wt 228.0 lb

## 2023-06-24 DIAGNOSIS — F319 Bipolar disorder, unspecified: Secondary | ICD-10-CM | POA: Diagnosis not present

## 2023-06-24 DIAGNOSIS — F902 Attention-deficit hyperactivity disorder, combined type: Secondary | ICD-10-CM | POA: Diagnosis not present

## 2023-06-24 DIAGNOSIS — F419 Anxiety disorder, unspecified: Secondary | ICD-10-CM | POA: Diagnosis not present

## 2023-06-24 MED ORDER — TEMAZEPAM 15 MG PO CAPS: 15.0000 mg | ORAL_CAPSULE | Freq: Every evening | ORAL | 2 refills | Status: DC | PRN

## 2023-06-24 MED ORDER — CITALOPRAM HYDROBROMIDE 20 MG PO TABS
20.0000 mg | ORAL_TABLET | Freq: Every day | ORAL | 2 refills | Status: DC
Start: 2023-06-24 — End: 2023-09-23

## 2023-06-24 MED ORDER — LAMOTRIGINE 200 MG PO TABS
200.0000 mg | ORAL_TABLET | Freq: Every day | ORAL | 2 refills | Status: DC
Start: 2023-06-24 — End: 2023-09-23

## 2023-06-24 NOTE — Progress Notes (Signed)
[facility] MD Virtual Progress Note   Patient Location: Home Provider Location: Home Office  I connect with patient by telephone and verified that I am speaking with correct person by using two identifiers. I discussed the limitations of evaluation and management by telemedicine and the availability of in person appointments. I also discussed with the patient that there may be a patient responsible charge related to this service. The patient expressed understanding and agreed to proceed.  Sarah Salas [id number] 47 y.o.  06/24/2023 8:29 AM  History of Present Illness:  Patient is evaluated by telephone session as her video was not working.  She is sleeping much better and noticed much better relationship with her mother.  Since taking temazepam at bedtime she noticed sleep is improved and her anxiety is much better.  She is more active, walking, exercise and drinking enough water.  She like to focus on her general health.  Her last liver enzymes were stable.  She was taking oxazepam however due to backorder and not able to get the medication on time be switched to temazepam.  She really liked this medication.  She has no tremor or shakes or any EPS.  She denies any impulsive behavior, mania, psychosis or any hallucination.  I noticed her speech is also much better and she is not getting distracted.  She is more relevant and conversation.  She feels getting close to the religion and talking to the mother had helped her a lot.  Now she decided to think before she talk and that helped her a lot.  She denies any panic attack, crying spells, any feeling of hopelessness or worthlessness.  She has no rash or any itching from the Lamictal.  She is not on any stimulant at this time.  We have discontinued Abilify, Strattera due to high liver enzymes.  She is also no longer taking gabapentin.  Past Psychiatric History: H/O ADHD, bipolar disorder. Vyvanse, Strattera, Hydroxyzine and Trazodone  did not help.  Adderall helped.  Latuda helped in the beginning but then got worse with increased dose.  No h/o paranoia, inpatient treatment or suicidal attempt. H/O TBI.  Gabapentin, Restoril and Abilify helped but caused increased weight gain, liver enzyme and blood sugar.     Outpatient Encounter Medications as of 06/24/2023  Medication Sig   citalopram (CELEXA) 20 MG tablet Take 1 tablet (20 mg total) by mouth daily.   diclofenac Sodium (VOLTAREN) 1 % GEL Apply 4 g topically 4 (four) times daily.   ergocalciferol (VITAMIN D2) 1.25 MG (50000 UT) capsule Take 1 capsule (50,000 Units total) by mouth once a week.   lamoTRIgine (LAMICTAL) 200 MG tablet Take 1 tablet (200 mg total) by mouth daily.   latanoprost (XALATAN) 0.005 % ophthalmic solution Place 1 drop into both eyes at bedtime.   meloxicam (MOBIC) 15 MG tablet Take 1 tablet (15 mg total) by mouth daily.   metFORMIN (GLUCOPHAGE-XR) 500 MG 24 hr tablet Take 1 tablet (500 mg total) by mouth 2 (two) times daily with a meal.   temazepam (RESTORIL) 15 MG capsule Take 1 capsule (15 mg total) by mouth at bedtime as needed for sleep.   No facility-administered encounter medications on file as of 06/24/2023.    Recent Results (from the past 2160 hour(s))  VITAMIN D 25 Hydroxy (Vit-D Deficiency, Fractures)     Status: None   Collection Time: 04/19/23  8:23 AM  Result Value Ref Range   Vit D, 25-Hydroxy 49.8 30.0 - 100.0 ng/mL  Comment: Vitamin D deficiency has been defined by the Institute of Medicine and an Endocrine Society practice guideline as a level of serum 25-OH vitamin D less than 20 ng/mL (1,2). The Endocrine Society went on to further define vitamin D insufficiency as a level between 21 and 29 ng/mL (2). 1. IOM (Institute of Medicine). 2010. Dietary reference    intakes for calcium and D. [place]: The    [employer]. 2. Holick MF, Binkley [place], Bischoff-Ferrari HA, et al.    Evaluation, treatment, and  prevention of vitamin D    deficiency: an Endocrine Society clinical practice    guideline. JCEM. 2011 Jul; 96(7):1911-30.      Psychiatric Specialty Exam: Physical Exam  Review of Systems  Weight 228 lb (103.4 kg).There is no height or weight on file to calculate BMI.  General Appearance: NA  Eye Contact:  NA  Speech:  Normal Rate  Volume:  Normal  Mood:  Anxious  Affect:  NA  Thought Process:  Goal Directed  Orientation:  Full (Time, Place, and Person)  Thought Content:  Rumination  Suicidal Thoughts:  No  Homicidal Thoughts:  No  Memory:  Immediate;   Fair Recent;   Fair Remote;   Good  Judgement:  Intact  Insight:  Present  Psychomotor Activity:  NA  Concentration:  Concentration: Good and Attention Span: Good  Recall:  Good  Fund of Knowledge:  Good  Language:  Good  Akathisia:  No  Handed:  Right  AIMS (if indicated):     Assets:  Communication Skills Desire for Improvement Housing Resilience Social Support Transportation  ADL's:  Intact  Cognition:  WNL  Sleep:  good     Assessment/Plan: Bipolar I disorder (HCC) - Plan: lamoTRIgine (LAMICTAL) 200 MG tablet, temazepam (RESTORIL) 15 MG capsule  Anxiety - Plan: citalopram (CELEXA) 20 MG tablet, lamoTRIgine (LAMICTAL) 200 MG tablet, temazepam (RESTORIL) 15 MG capsule  Attention deficit hyperactivity disorder (ADHD), combined type - Plan: citalopram (CELEXA) 20 MG tablet, lamoTRIgine (LAMICTAL) 200 MG tablet  Patient doing much better as more focus on her health and talking to her mother and getting close to the religion.  She had a good support from her boyfriend.  I reviewed blood work results and also notes from primary care.  She has going to have a blood work for her liver enzymes in few weeks.  She is more calm with better mood.  I recommend to continue current medicine since it is working.  Continue Celexa 20 mg daily, Lamictal 200 mg daily and temazepam 15 mg at bedtime.  Discussed medication side  effects and benefits.  Recommend to call us back if she has any question or any concern.  Follow-up in 3 months.   Follow Up Instructions:     I discussed the assessment and treatment plan with the patient. The patient was provided an opportunity to ask questions and all were answered. The patient agreed with the plan and demonstrated an understanding of the instructions.   The patient was advised to call back or seek an in-person evaluation if the symptoms worsen or if the condition fails to improve as anticipated.    Collaboration of Care: Other provider involved in patient's care AEB notes are available in epic to review.  Patient/Guardian was advised Release of Information must be obtained prior to any record release in order to collaborate their care with an outside provider. Patient/Guardian was advised if they have not already done so to contact the  registration department to sign all necessary forms in order for [place] to release information regarding their care.   Consent: Patient/Guardian gives verbal consent for treatment and assignment of benefits for services provided during this visit. Patient/Guardian expressed understanding and agreed to proceed.     I provided 27 minutes of non face to face time during this encounter.  Note: This document was prepared by [employer] voice dictation technology and any errors that results from this process are unintentional.    Cleotis Nipper, MD 06/24/2023

## 2023-07-16 ENCOUNTER — Ambulatory Visit: Payer: Medicaid Other | Admitting: Nurse Practitioner

## 2023-07-19 ENCOUNTER — Other Ambulatory Visit (INDEPENDENT_AMBULATORY_CARE_PROVIDER_SITE_OTHER): Payer: Medicaid Other

## 2023-07-19 ENCOUNTER — Encounter: Payer: Self-pay | Admitting: Internal Medicine

## 2023-07-19 ENCOUNTER — Ambulatory Visit: Payer: Medicaid Other | Admitting: Internal Medicine

## 2023-07-19 ENCOUNTER — Encounter (INDEPENDENT_AMBULATORY_CARE_PROVIDER_SITE_OTHER): Payer: Self-pay | Admitting: Internal Medicine

## 2023-07-19 ENCOUNTER — Ambulatory Visit (INDEPENDENT_AMBULATORY_CARE_PROVIDER_SITE_OTHER): Payer: Medicaid Other | Admitting: Internal Medicine

## 2023-07-19 ENCOUNTER — Other Ambulatory Visit (HOSPITAL_COMMUNITY): Payer: Self-pay

## 2023-07-19 VITALS — BP 106/74 | HR 81 | Temp 98.1°F | Ht 69.0 in | Wt 229.0 lb

## 2023-07-19 VITALS — BP 110/70 | HR 97 | Ht 69.0 in | Wt 231.0 lb

## 2023-07-19 DIAGNOSIS — R7989 Other specified abnormal findings of blood chemistry: Secondary | ICD-10-CM

## 2023-07-19 DIAGNOSIS — K760 Fatty (change of) liver, not elsewhere classified: Secondary | ICD-10-CM

## 2023-07-19 DIAGNOSIS — E559 Vitamin D deficiency, unspecified: Secondary | ICD-10-CM

## 2023-07-19 DIAGNOSIS — Z860100 Personal history of colon polyps, unspecified: Secondary | ICD-10-CM

## 2023-07-19 DIAGNOSIS — E88819 Insulin resistance, unspecified: Secondary | ICD-10-CM | POA: Diagnosis not present

## 2023-07-19 DIAGNOSIS — E66812 Obesity, class 2: Secondary | ICD-10-CM | POA: Diagnosis not present

## 2023-07-19 DIAGNOSIS — Z6836 Body mass index (BMI) 36.0-36.9, adult: Secondary | ICD-10-CM

## 2023-07-19 LAB — PROTIME-INR
INR: 1 {ratio} (ref 0.8–1.0)
Prothrombin Time: 10.4 s (ref 9.6–13.1)

## 2023-07-19 LAB — COMPREHENSIVE METABOLIC PANEL
ALT: 15 U/L (ref 0–35)
AST: 14 U/L (ref 0–37)
Albumin: 4.6 g/dL (ref 3.5–5.2)
Alkaline Phosphatase: 90 U/L (ref 39–117)
BUN: 12 mg/dL (ref 6–23)
CO2: 30 meq/L (ref 19–32)
Calcium: 10 mg/dL (ref 8.4–10.5)
Chloride: 105 meq/L (ref 96–112)
Creatinine, Ser: 0.73 mg/dL (ref 0.40–1.20)
GFR: 99.22 mL/min (ref >60.00)
Glucose, Bld: 93 mg/dL (ref 70–99)
Potassium: 4.6 meq/L (ref 3.5–5.1)
Sodium: 144 meq/L (ref 135–145)
Total Bilirubin: 0.3 mg/dL (ref 0.2–1.2)
Total Protein: 7.4 g/dL (ref 6.0–8.3)

## 2023-07-19 MED ORDER — SEMAGLUTIDE-WEIGHT MANAGEMENT 0.25 MG/0.5ML ~~LOC~~ SOAJ
0.2500 mg | SUBCUTANEOUS | 0 refills | Status: DC
Start: 2023-07-19 — End: 2023-08-12
  Filled 2023-07-19 – 2023-08-04 (×2): qty 2, 28d supply, fill #0

## 2023-07-19 MED ORDER — ERGOCALCIFEROL 1.25 MG (50000 UT) PO CAPS
50000.0000 [IU] | ORAL_CAPSULE | ORAL | 0 refills | Status: AC
  Filled 2023-07-19: qty 12, 84d supply, fill #0

## 2023-07-19 NOTE — Progress Notes (Signed)
Office: [phone]  /  Fax: [phone]  WEIGHT SUMMARY AND BIOMETRICS  Vitals Temp: 98.1 F (36.7 C) BP: 106/74 Pulse Rate: 81 SpO2: 99 %   Anthropometric Measurements Height: 5\' 9"  (1.753 m) Weight: 229 lb (103.9 kg) BMI (Calculated): 33.8 Weight at Last Visit: 228 lb Weight Lost Since Last Visit: 0 lb Weight Gained Since Last Visit: 1 lb Starting Weight: 268 lb Total Weight Loss (lbs): 39 lb (17.7 kg) Peak Weight: 450 lb   Body Composition  Body Fat %: 43.4 % Fat Mass (lbs): 99.6 lbs Muscle Mass (lbs): 123.2 lbs Total Body Water (lbs): 85 lbs Visceral Fat Rating : 10    No data recorded Today's Visit #: 13  Starting Date: 08/18/22   HPI  Chief Complaint: OBESITY  Sarah Salas is here to discuss her progress with her obesity treatment plan. She is on the the Category 1 Plan and states she is following her eating plan approximately 95 % of the time. She states she is exercising 120 minutes 5 times per week.  Interval History:  Since last office visit she has maintained weight. She reports good adherence to reduced calorie nutritional plan. She has been working on reading food labels, not skipping meals, increasing protein intake at every meal, drinking more water, making healthier choices, reducing portion sizes, and incorporating more whole foods  Orexigenic Control: Reports problems with appetite and hunger signals.  Reports problems with satiety and satiation.  Denies problems with eating patterns and portion control.  Denies abnormal cravings. Denies feeling deprived or restricted.   Barriers identified:  metabolic adaptations associated with weight loss.  She is emotionally distraught as she feels she has been failing even though she has had significant success.  Pharmacotherapy for weight loss: She is currently taking no anti-obesity medication.    ASSESSMENT AND PLAN  TREATMENT PLAN FOR OBESITY:  Recommended Dietary Goals  Sarah Salas is currently  in the action stage of change. As such, her goal is to continue weight management plan. She has agreed to: continue current plan  Behavioral Intervention  We discussed the following Behavioral Modification Strategies today: continue to work on maintaining a reduced calorie state, getting the recommended amount of protein, incorporating whole foods, making healthy choices, staying well hydrated and practicing mindfulness when eating..  Additional resources provided today: None  Recommended Physical Activity Goals  Sarah Salas has been advised to work up to 150 minutes of moderate intensity aerobic activity a week and strengthening exercises 2-3 times per week for cardiovascular health, weight loss maintenance and preservation of muscle mass.   She has agreed to :  Think about enjoyable ways to increase daily physical activity and overcoming barriers to exercise and Increase physical activity in their day and reduce sedentary time (increase NEAT).  Pharmacotherapy We discussed various medication options to help Sarah Salas with her weight loss efforts and we both agreed to : start anti-obesity medication.   In addition to reduced calorie nutrition plan (RCNP), behavioral strategies and physical activity, Sarah Salas would benefit from pharmacotherapy to assist with hunger signals, satiety and cravings.  She has metabolic adaptations associated with significant weight loss and chronic restriction of calories.  This will reduce obesity-related health risks by inducing weight loss, and help reduce food consumption and adherence to [facility]) . It may also improve QOL by improving self-confidence and reduce the  setbacks associated with metabolic adaptations.  After discussion of treatment options, mechanisms of action, benefits, side effects, contraindications and shared decision making she is agreeable to  starting Wegovy 0.25 mg once daily. Patient also made aware that medication is indicated for long-term management of  obesity and the risk of weight regain following discontinuation of treatment and hence the importance of adhering to medical weight loss plan.  We demonstrated use of device and patient using teach back method was able to demonstrate proper technique.  ASSOCIATED CONDITIONS ADDRESSED TODAY  Class 2 severe obesity with serious comorbidity and body mass index (BMI) of 36.0 to 36.9 in adult, unspecified obesity type [facility]) Assessment & Plan: Patient reports having a peak weight of 450 she is now at 229.  She has maintained this time around but overall lost 40 pounds or 15 % of baseline body weight since already program.  Her BIA shows a reduction in body fat but an increase in muscle mass she is doing strengthening.  She is feeling discouraged because of weight loss plateauing.  She was provided information on this natural process.  Patient was reassured that this due to things out of her control.  I feel that she would benefit from incretin therapy as she is experiencing increased hunger and impaired sense of fullness which again is commonly associated with weight loss.  Orders: -     Semaglutide-Weight Management; Inject 0.25 mg into the skin once a week for 28 days.  Dispense: 2 mL; Refill: 0  Insulin resistance Assessment & Plan: Her HOMA-IR is 3.15 which is elevated. Optimal level < 1.9. This is complex condition associated with genetics, ectopic fat and lifestyle factors. Insulin resistance may result in weight gain, abnormal cravings (particularly for carbs) and fatigue. This may result in additional weight gain and lead to pre-diabetes and diabetes if untreated.   Lab Results  Component Value Date   HGBA1C 5.6 11/04/2022   Lab Results  Component Value Date   INSULIN 13.6 11/04/2022   Lab Results  Component Value Date   GLUCOSE 92 02/22/2023   GLUCOSE 87 11/19/2017   She is currently on metformin XR 500 mg twice a day for pharmacoprophylaxis.  She will benefit from incretin therapy.   After discussion of benefits and side effect she will be started on Wegovy 0.25 mg once a week.  Me may consider monotherapy with Wegovy if she is having an adequate clinical response.   Vitamin D deficiency Assessment & Plan: Currently on high-dose vitamin D supplementation without any adverse effects.  Check vitamin D levels in 3 months and transition to over-the-counter supplementation if levels are adequate.  Orders: -     Ergocalciferol; Take 1 capsule (50,000 Units total) by mouth once a week.  Dispense: 12 capsule; Refill: 0  Metabolic dysfunction-associated steatotic liver disease (MASLD)    PHYSICAL EXAM:  Blood pressure 106/74, pulse 81, temperature 98.1 F (36.7 C), height 5\' 9"  (1.753 m), weight 229 lb (103.9 kg), SpO2 99%. Body mass index is 33.82 kg/m.  General: She is overweight, cooperative, alert, well developed, and in no acute distress. PSYCH: Has normal mood, affect and thought process.   HEENT: EOMI, sclerae are anicteric. Lungs: Normal breathing effort, no conversational dyspnea. Extremities: No edema.  Neurologic: No gross sensory or motor deficits. No tremors or fasciculations noted.    DIAGNOSTIC DATA REVIEWED:  BMET    Component Value Date/Time   NA 143 02/22/2023 0744   K 4.8 02/22/2023 0744   CL 104 02/22/2023 0744   CO2 23 02/22/2023 0744   GLUCOSE 92 02/22/2023 0744   GLUCOSE 125 (H) 05/20/2022 0155   BUN 16 02/22/2023  0744   CREATININE 0.81 02/22/2023 0744   CALCIUM 10.1 02/22/2023 0744   GFRNONAA >60 05/20/2022 0155   GFRAA 101 11/25/2020 0919   Lab Results  Component Value Date   HGBA1C 5.6 11/04/2022   HGBA1C 5.3 12/26/2019   Lab Results  Component Value Date   INSULIN 13.6 11/04/2022   Lab Results  Component Value Date   TSH 1.250 01/13/2023   CBC    Component Value Date/Time   WBC 5.6 01/13/2023 1344   WBC 6.7 05/12/2022 0959   RBC 4.57 01/13/2023 1344   RBC 4.85 05/12/2022 0959   HGB 13.4 01/13/2023 1344   HCT 40.1  01/13/2023 1344   PLT 266 01/13/2023 1344   MCV 88 01/13/2023 1344   MCH 29.3 01/13/2023 1344   MCH 29.5 05/12/2022 0959   MCHC 33.4 01/13/2023 1344   MCHC 32.9 05/12/2022 0959   RDW 13.0 01/13/2023 1344   Iron Studies    Component Value Date/Time   IRON 68 11/18/2022 1117   TIBC 329 11/18/2022 1117   FERRITIN 62 11/18/2022 1117   IRONPCTSAT 21 11/18/2022 1117   Lipid Panel     Component Value Date/Time   CHOL 201 (H) 01/13/2023 1344   TRIG 138 01/13/2023 1344   HDL 47 01/13/2023 1344   CHOLHDL 4.3 01/13/2023 1344   LDLCALC 129 (H) 01/13/2023 1344   Hepatic Function Panel     Component Value Date/Time   PROT 6.8 02/22/2023 0744   ALBUMIN 4.8 02/22/2023 0744   AST 25 02/22/2023 0744   ALT 42 (H) 02/22/2023 0744   ALKPHOS 119 02/22/2023 0744   BILITOT 0.5 02/22/2023 0744      Component Value Date/Time   TSH 1.250 01/13/2023 1344   Nutritional Lab Results  Component Value Date   VD25OH 49.8 04/19/2023   VD25OH 21.8 (L) 11/04/2022     Return in about 3 weeks (around 08/09/2023) for For Weight Mangement with Dr. Rikki Spearing.Marland Kitchen She was informed of the importance of frequent follow up visits to maximize her success with intensive lifestyle modifications for her multiple health conditions.   ATTESTASTION STATEMENTS:  Reviewed by clinician on day of visit: allergies, medications, problem list, medical history, surgical history, family history, social history, and previous encounter notes.   I have spent 40 minutes in the care of the patient today including: preparing to see patient (e.g. review and interpretation of tests, old notes ), obtaining and/or reviewing separately obtained history, performing a medically appropriate examination or evaluation, counseling and educating the patient, ordering medications, test or procedures, documenting clinical information in the electronic or other health care record, and independently interpreting results and communicating results  to the patient, family, or caregiver   Worthy Rancher, MD

## 2023-07-19 NOTE — Progress Notes (Signed)
Chief Complaint: Elevated LFTs  HPI : 47 year old female with history of ADHD, bipolar disorder, glaucoma, and anxiety presents with elevated LFTs.  Her psychiatrist and PCP have been following her LFTs over time, which started increasing on 10/2022.  Her psychiatrist attempted to take her off any medications that are potentially causing elevated LFTs.  She has been on lamotrigine for years.  Denies any new medications since 10/2022.  She has not had any alcohol for decades.  Denies family history of liver disease. Denies exposures to hepatitis. Denies travel outside of the country. Denies use of herbal supplements. Denies NSAID use. Bowel habits have been good. She has lost about 39 lbs since 12/2022.  Denies jaundice, confusion, swallowing and abdomen, or blood in the stools.  Wt Readings from Last 3 Encounters:  07/19/23 231 lb (104.8 kg)  07/19/23 229 lb (103.9 kg)  06/07/23 228 lb (103.4 kg)    Past Medical History:  Diagnosis Date   ADHD (attention deficit hyperactivity disorder)    Anxiety    Arthritis    Bipolar 1 disorder (HCC)    Brain injury (HCC) 2005   Glaucoma    Hyperlipidemia    Hypertension    no longer treated     Past Surgical History:  Procedure Laterality Date   BRAIN SURGERY  10/28/2003   from [facility]   KNEE ARTHROSCOPY WITH MENISCAL REPAIR Right 05/19/2022   Procedure: RIGHT KNEE ARTHROSCOPY, MEDIAL MENISCAL DEBRIDEMENT AND ABRASION ARTHROPLASTY;  Surgeon: Huel Cote, MD;  Location: [facility] OR;  Service: Orthopedics;  Laterality: Right;   TIBIA OSTEOTOMY Right 05/19/2022   Procedure: RIGHT KNEE HIGH TIBIAL OSTEOTOMY;  Surgeon: Huel Cote, MD;  Location: [facility] OR;  Service: Orthopedics;  Laterality: Right;   WISDOM TOOTH EXTRACTION     Family History  Problem Relation Age of Onset   Bipolar disorder Mother    Hypertension Mother    Miscarriages / [place] Mother    Vision loss Mother    Varicose Veins Mother    High Cholesterol Mother    Anxiety  disorder Mother    Obesity Mother    Alcohol abuse Father    Heart disease Father    Learning disabilities Father    High blood pressure Father    High Cholesterol Father    Sudden death Father    Obesity Father    Bipolar disorder Sister    Learning disabilities Sister    Diabetes Paternal Aunt    Bipolar disorder Maternal Grandmother    Hypertension Maternal Grandfather    Cancer Paternal Grandmother    Cancer Paternal Grandfather    Colon cancer Neg Hx    Colon polyps Neg Hx    Esophageal cancer Neg Hx    Rectal cancer Neg Hx    Stomach cancer Neg Hx    Social History   Tobacco Use   Smoking status: Former    Current packs/day: 0.00    Types: Cigarettes    Start date: 06/13/2001    Quit date: 06/13/2021    Years since quitting: 2.0   Smokeless tobacco: Never  Vaping Use   Vaping status: Every Day   Substances: Flavoring  Substance Use Topics   Alcohol use: No   Drug use: No   Current Outpatient Medications  Medication Sig Dispense Refill   citalopram (CELEXA) 20 MG tablet Take 1 tablet (20 mg total) by mouth daily. 30 tablet 2   diclofenac Sodium (VOLTAREN) 1 % GEL Apply 4 g topically 4 (  four) times daily. 50 g 3   ergocalciferol (VITAMIN D2) 1.25 MG (50000 UT) capsule Take 1 capsule (50,000 Units total) by mouth once a week. 12 capsule 0   lamoTRIgine (LAMICTAL) 200 MG tablet Take 1 tablet (200 mg total) by mouth daily. 30 tablet 2   latanoprost (XALATAN) 0.005 % ophthalmic solution Place 1 drop into both eyes at bedtime. 2.5 mL 6   metFORMIN (GLUCOPHAGE-XR) 500 MG 24 hr tablet Take 1 tablet (500 mg total) by mouth 2 (two) times daily with a meal. 180 tablet 0   Semaglutide-Weight Management 0.25 MG/0.5ML SOAJ Inject 0.25 mg into the skin once a week for 28 days. 2 mL 0   temazepam (RESTORIL) 15 MG capsule Take 1 capsule (15 mg total) by mouth at bedtime as needed for sleep. 30 capsule 2   No current facility-administered medications for this visit.   No Known  Allergies   Review of Systems: All systems reviewed and negative except where noted in HPI.   Physical Exam: BP 110/70   Pulse 97   Ht 5\' 9"  (1.753 m)   Wt 231 lb (104.8 kg)   BMI 34.11 kg/m  Constitutional: Pleasant,well-developed, female in no acute distress. HEENT: Normocephalic and atraumatic. Conjunctivae are normal. No scleral icterus. Cardiovascular: Normal rate, regular rhythm.  Pulmonary/chest: Effort normal and breath sounds normal. No wheezing, rales or rhonchi. Abdominal: Soft, nondistended, nontender. Bowel sounds active throughout. There are no masses palpable. No hepatomegaly. Extremities: No edema Neurological: Alert and oriented to person place and time. Skin: Skin is warm and dry. No rashes noted. Psychiatric: Normal mood and affect. Behavior is normal.  Labs 01/2023: TSH and free T4 normal.  CBC normal with platelets of 266.  CMP with elevated alk phos of 131, AST of 231, ALT of 440.  Labs 02/2023: CMP with mildly elevated ALT of 42.  INR normal at 1  Ab U/S 01/29/23: IMPRESSION: 1. Diffuse increased echotexture of the liver. This is a nonspecific finding but can be seen in fatty infiltration of liver. 2. No other abnormality identified.  Colonoscopy 03/03/23: - Mild mucosal changes were found in the ileum. Biopsied. - One 3 mm polyp in the ascending colon, removed with a cold snare. Resected and retrieved. - Diverticulosis in the sigmoid colon and in the descending colon. - Non- bleeding internal hemorrhoids. Path: 1. Surgical [P], small bowel, ileum - PATCHY MILD ACTIVE CHRONIC ILEITIS (SEE NOTE) 2. Surgical [P], colon, ascending, polyp (1) - DIMINUTIVE TUBULAR ADENOMA. - NO HIGH GRADE DYSPLASIA OR MALIGNANCY. Diagnosis Note 1. - Morphologic evaluation reveals patchy mild active chronic ileitis with villous blunting and rare clusters of epithelioid histiocytes, and basal lymphoplasmacytosis. While the observed findings could be compatible with inflammatory  bowel disease (Crohns disease), other etiologies, including medication-effect (particularly NSAIDs) and an infection cannot be excluded histologically. Clinical and endoscopic correlation is recommended.  ASSESSMENT AND PLAN: Elevated LFTs History of ileitis History of colon polyps Patient presents for elevated Latisse that started in 10/2022.  Elevations have ranged from the mild to moderate range and have primarily consisted of AST, ALT, and alkaline phosphatase elevations.  Her recent abdominal ultrasound in 01/2023 shows some concern for potential fatty liver.  Will plan for broad workup to look for alternative etiologies of liver disease.  Patient denies any significant alcohol use.  Her lamotrigine could be a culprit for her elevated LFTs so I have asked her to reach out to her psychiatrist to see if a substitute could be prescribed.  -  Check LFTs, PT/INR, hepatitis A antibody, ANA, IgG, ASMA, AMA, ferritin/IBC, ceruloplasmin, A1AT - Patient will talk to her psychiatrist about a substitute for lamotrigine - Next colonoscopy due in 02/2030 for polyp surveillance - RTC in 3 months  Eulah Pont, MD  I spent 31 minutes of time, including in depth chart review, independent review of results as outlined above, communicating results with the patient directly, face-to-face time with the patient, coordinating care, and ordering studies and medications as appropriate, and documentation.

## 2023-07-19 NOTE — Assessment & Plan Note (Signed)
Her HOMA-IR is 3.15 which is elevated. Optimal level < 1.9. This is complex condition associated with genetics, ectopic fat and lifestyle factors. Insulin resistance may result in weight gain, abnormal cravings (particularly for carbs) and fatigue. This may result in additional weight gain and lead to pre-diabetes and diabetes if untreated.   Lab Results  Component Value Date   HGBA1C 5.6 11/04/2022   Lab Results  Component Value Date   INSULIN 13.6 11/04/2022   Lab Results  Component Value Date   GLUCOSE 92 02/22/2023   GLUCOSE 87 11/19/2017   She is currently on metformin XR 500 mg twice a day for pharmacoprophylaxis.  She will benefit from incretin therapy.  After discussion of benefits and side effect she will be started on Wegovy 0.25 mg once a week.  Me may consider monotherapy with Wegovy if she is having an adequate clinical response.

## 2023-07-19 NOTE — Assessment & Plan Note (Signed)
Patient reports having a peak weight of 450 she is now at 229.  She has maintained this time around but overall lost 40 pounds or 15 % of baseline body weight since already program.  Her BIA shows a reduction in body fat but an increase in muscle mass she is doing strengthening.  She is feeling discouraged because of weight loss plateauing.  She was provided information on this natural process.  Patient was reassured that this due to things out of her control.  I feel that she would benefit from incretin therapy as she is experiencing increased hunger and impaired sense of fullness which again is commonly associated with weight loss.

## 2023-07-19 NOTE — Assessment & Plan Note (Signed)
Currently on high-dose vitamin D supplementation without any adverse effects.  Check vitamin D levels in 3 months and transition to over-the-counter supplementation if levels are adequate.

## 2023-07-19 NOTE — Patient Instructions (Addendum)
Your provider has requested that you go to the basement level for lab work before leaving today. Press "B" on the elevator. The lab is located at the first door on the left as you exit the elevator.  You are scheduled for a follow up on 11/02/23 at 9:50 am  If your blood pressure at your visit was 140/90 or greater, please contact your primary care physician to follow up on this.  _______________________________________________________  If you are age 45 or older, your body mass index should be between 23-30. Your Body mass index is 34.11 kg/m. If this is out of the aforementioned range listed, please consider follow up with your Primary Care Provider.  If you are age 67 or younger, your body mass index should be between 19-25. Your Body mass index is 34.11 kg/m. If this is out of the aformentioned range listed, please consider follow up with your Primary Care Provider.   ________________________________________________________  The [facility] providers would like to encourage you to use [facility] to communicate with providers for non-urgent requests or questions.  Due to long hold times on the telephone, sending your provider a message by [facility] may be a faster and more efficient way to get a response.  Please allow 48 business hours for a response.  Please remember that this is for non-urgent requests.  _______________________________________________________  Due to recent changes in healthcare laws, you may see the results of your imaging and laboratory studies on MyChart before your provider has had a chance to review them.  We understand that in some cases there may be results that are confusing or concerning to you. Not all laboratory results come back in the same time frame and the provider may be waiting for multiple results in order to interpret others.  Please give [place] 48 hours in order for your provider to thoroughly review all the results before contacting the office for clarification of  your results.   .sher Thank you for entrusting me with your care and for choosing [facility], Dr. Eulah Pont

## 2023-07-20 ENCOUNTER — Ambulatory Visit: Payer: Medicaid Other

## 2023-07-22 ENCOUNTER — Encounter (INDEPENDENT_AMBULATORY_CARE_PROVIDER_SITE_OTHER): Payer: Self-pay | Admitting: Internal Medicine

## 2023-07-22 ENCOUNTER — Other Ambulatory Visit (HOSPITAL_COMMUNITY): Payer: Self-pay

## 2023-07-22 LAB — TEST AUTHORIZATION 2

## 2023-07-22 LAB — ANA: Anti Nuclear Antibody (ANA): NEGATIVE

## 2023-07-22 LAB — ANTI-SMOOTH MUSCLE ANTIBODY, IGG: Actin (Smooth Muscle) Antibody (IGG): <20 U (ref <20)

## 2023-07-22 LAB — HEPATITIS A ANTIBODY, TOTAL: Hepatitis A AB,Total: NONREACTIVE

## 2023-07-22 LAB — ALPHA-1-ANTITRYPSIN: A-1 Antitrypsin, Ser: 168 mg/dL (ref 83–199)

## 2023-07-22 LAB — CERULOPLASMIN: Ceruloplasmin: 23 mg/dL (ref 14–48)

## 2023-07-22 LAB — MITOCHONDRIAL ANTIBODIES: Mitochondrial M2 Ab, IgG: <=20 U (ref <=20.0)

## 2023-07-22 LAB — IGG: IgG (Immunoglobin G), Serum: 859 mg/dL (ref 600–1640)

## 2023-07-23 ENCOUNTER — Encounter (INDEPENDENT_AMBULATORY_CARE_PROVIDER_SITE_OTHER): Payer: Self-pay | Admitting: Internal Medicine

## 2023-07-28 ENCOUNTER — Telehealth (HOSPITAL_COMMUNITY): Payer: Self-pay | Admitting: *Deleted

## 2023-07-28 NOTE — Telephone Encounter (Signed)
PA for Temazepam 15 mg capsules submitted to Medicare via CoverMyMeds portal.  Approved from 07/28/23 through 01/24/24.

## 2023-07-30 ENCOUNTER — Other Ambulatory Visit (HOSPITAL_COMMUNITY): Payer: Self-pay

## 2023-08-03 ENCOUNTER — Telehealth (INDEPENDENT_AMBULATORY_CARE_PROVIDER_SITE_OTHER): Payer: Self-pay

## 2023-08-03 NOTE — Telephone Encounter (Signed)
Prior Berkley Harvey has been submitted for [facility].  Awaiting determination.

## 2023-08-04 ENCOUNTER — Other Ambulatory Visit (HOSPITAL_COMMUNITY): Payer: Self-pay

## 2023-08-09 NOTE — Telephone Encounter (Signed)
PA for [name] has been approved.   Your prior authorization for [name] has been approved! More Info Personalized support and financial assistance may be available through the [employer] program. For more information, and to see program requirements, click on the More Info button to the right.  Message from plan: PA Case: [id number], Status: Approved, Coverage Starts on: 08/04/2023 12:00:00 AM, Coverage Ends on: 01/31/2024 12:00:00 AM.. Authorization Expiration Date: January 31, 2024.  Pt. Notified via MyChart.

## 2023-08-12 ENCOUNTER — Encounter (INDEPENDENT_AMBULATORY_CARE_PROVIDER_SITE_OTHER): Payer: Self-pay | Admitting: Internal Medicine

## 2023-08-12 ENCOUNTER — Ambulatory Visit (INDEPENDENT_AMBULATORY_CARE_PROVIDER_SITE_OTHER): Payer: Medicaid Other | Admitting: Internal Medicine

## 2023-08-12 VITALS — BP 103/69 | HR 85 | Temp 97.8°F | Ht 69.0 in | Wt 231.0 lb

## 2023-08-12 DIAGNOSIS — E66812 Obesity, class 2: Secondary | ICD-10-CM

## 2023-08-12 DIAGNOSIS — R638 Other symptoms and signs concerning food and fluid intake: Secondary | ICD-10-CM

## 2023-08-12 DIAGNOSIS — K760 Fatty (change of) liver, not elsewhere classified: Secondary | ICD-10-CM

## 2023-08-12 DIAGNOSIS — E88819 Insulin resistance, unspecified: Secondary | ICD-10-CM | POA: Diagnosis not present

## 2023-08-12 DIAGNOSIS — Z6836 Body mass index (BMI) 36.0-36.9, adult: Secondary | ICD-10-CM

## 2023-08-12 MED ORDER — SEMAGLUTIDE-WEIGHT MANAGEMENT 0.5 MG/0.5ML ~~LOC~~ SOAJ: 0.5000 mg | SUBCUTANEOUS | 0 refills | Status: AC

## 2023-08-12 MED ORDER — METFORMIN HCL ER 500 MG PO TB24: 500.0000 mg | ORAL_TABLET | Freq: Two times a day (BID) | ORAL | 0 refills | Status: DC

## 2023-08-12 NOTE — Assessment & Plan Note (Signed)
Her HOMA-IR is 3.15 which is elevated. Optimal level < 1.9. This is complex condition associated with genetics, ectopic fat and lifestyle factors. Insulin resistance may result in weight gain, abnormal cravings (particularly for carbs) and fatigue. This may result in additional weight gain and lead to pre-diabetes and diabetes if untreated.   Lab Results  Component Value Date   HGBA1C 5.6 11/04/2022   Lab Results  Component Value Date   INSULIN 13.6 11/04/2022   Lab Results  Component Value Date   GLUCOSE 93 07/19/2023   GLUCOSE 87 11/19/2017   She is currently on metformin XR 500 mg twice a day and Wegovy 0.25 mg once a week for pharmacoprophylaxis.  No side effects reported she will continue current regimen.  We will consider discontinuing metformin once she reaches the 1 mg dose of Wegovy.

## 2023-08-12 NOTE — Assessment & Plan Note (Signed)
Improved on incretin therapy. She has increased orexigenic signaling, impaired satiety and inhibitory control. This is secondary to an abnormal energy regulation system and pathological neurohormonal pathways characteristic of excess adiposity.  In addition to nutritional and behavioral strategies she benefits from ongoing pharmacotherapy.

## 2023-08-12 NOTE — Progress Notes (Signed)
Office: [phone]  /  Fax: [phone]  WEIGHT SUMMARY AND BIOMETRICS  Vitals Temp: 97.8 F (36.6 C) BP: 103/69 Pulse Rate: 85 SpO2: 99 %   Anthropometric Measurements Height: 5\' 9"  (1.753 m) Weight: 231 lb (104.8 kg) BMI (Calculated): 34.1 Weight at Last Visit: 229 lb Weight Lost Since Last Visit: 0 lb Weight Gained Since Last Visit: 3 lb Starting Weight: 268 lb Total Weight Loss (lbs): 36 lb (16.3 kg) Peak Weight: 450 lb   Body Composition  Body Fat %: 43.3 % Fat Mass (lbs): 100.4 lbs Muscle Mass (lbs): 124.8 lbs Total Body Water (lbs): 85.6 lbs Visceral Fat Rating : 10    No data recorded Today's Visit #: 14  Starting Date: 08/18/22   HPI  Chief Complaint: OBESITY  Sarah Salas is here to discuss her progress with her obesity treatment plan. She is on the the Category 2 Plan and states she is following her eating plan approximately 75 % of the time. She states she is walking 2 hours 5 times per week.  Interval History:  Since last office visit she has gained 3 pounds. She reports good adherence to reduced calorie nutritional plan. She has been working on reading food labels, not skipping meals, increasing protein intake at every meal, drinking more water, making healthier choices, reducing portion sizes, and incorporating more whole foods. Acknowledges more snacking and decreased.   Orexigenic Control: Reports problems with appetite and hunger signals.  Denies problems with satiety and satiation.  Denies problems with eating patterns and portion control.  Reports abnormal cravings. Denies feeling deprived or restricted.   Barriers identified: strong hunger signals and impaired satiety / inhibitory control.   Pharmacotherapy for weight loss: She is currently taking Wegovy with adequate clinical response  and without side effects..    ASSESSMENT AND PLAN  TREATMENT PLAN FOR OBESITY:  Recommended Dietary Goals  Sarah Salas is currently in the action  stage of change. As such, her goal is to continue weight management plan. She has agreed to: continue current plan  Behavioral Intervention  We discussed the following Behavioral Modification Strategies today: continue to work on maintaining a reduced calorie state, getting the recommended amount of protein, incorporating whole foods, making healthy choices, staying well hydrated and practicing mindfulness when eating..  Additional resources provided today: None  Recommended Physical Activity Goals  Sarah Salas has been advised to work up to 150 minutes of moderate intensity aerobic activity a week and strengthening exercises 2-3 times per week for cardiovascular health, weight loss maintenance and preservation of muscle mass.   She has agreed to :  Resume previous levels of physical activity  Pharmacotherapy We discussed various medication options to help Sarah Salas with her weight loss efforts and we both agreed to : continue current anti-obesity medication regimen  ASSOCIATED CONDITIONS ADDRESSED TODAY  Metabolic dysfunction-associated steatotic liver disease (MASLD) Assessment & Plan: Her most recent liver enzymes showed an improvement but she has had these wide fluctuations and is currently asymptomatic.  I had referred her to gastroenterology and appointment has been scheduled for October.  Her highest  AST is 231 and ALT is 440.  Alkaline phosphatase is 131.  Her liver enzymes had once improved after discontinuation of some of her psychotropic medication,  She had negative iron studies and hepatitis serologies.  She had liver ultrasound that showed hepatic steatosis.  Her fibrosis 4 Score = 1.86 (Indeterminate), so she needs further risk stratification.   She denies consumption of alcohol or use of over-the-counter supplements.  Because of elevations 2-3 times the upper limit of normal I still want her to see gastroenterology.  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 Considering fibrosis score she may benefit from fibrotest .  She has no symptoms of autoimmune conditions so we will hold off autoimmune workup and will defer to gastroenterology if they feel is necessary.  She has lost 15% of total body weight thus far.  She is also now on semaglutide 0.25 mg once a week this will be increased to 0.5 mg weekly.  Orders: -     Semaglutide-Weight Management; Inject 0.5 mg into the skin once a week for 28 days.  Dispense: 2 mL; Refill: 0  Class 2 severe obesity with serious comorbidity and body mass index (BMI) of 36.0 to 36.9 in adult, unspecified obesity type Sarah Salas) Assessment & Plan: See obesity treatment plan  Orders: -     Semaglutide-Weight Management; Inject 0.5 mg into the skin once a week for 28 days.  Dispense: 2 mL; Refill: 0  Insulin resistance Assessment & Plan: Her HOMA-IR is 3.15 which is elevated. Optimal level < 1.9. This is complex condition associated with genetics, ectopic fat and lifestyle factors. Insulin resistance may result in weight gain, abnormal cravings (particularly for carbs) and fatigue. This may result in additional weight gain and lead to pre-diabetes and diabetes if untreated.   Lab Results  Component Value Date   HGBA1C 5.6 11/04/2022   Lab Results  Component Value Date   INSULIN 13.6 11/04/2022   Lab Results  Component Value Date   GLUCOSE 93 07/19/2023   GLUCOSE 87 11/19/2017   She is currently on metformin XR 500 mg twice a day and Wegovy 0.25 mg once a week for pharmacoprophylaxis.  No side effects reported she will continue current regimen.  We will consider discontinuing metformin once she reaches the 1 mg dose of Wegovy.  Orders: -     metFORMIN HCl ER; Take 1 tablet (500 mg total) by mouth 2 (two) times daily with a meal.  Dispense: 180 tablet; Refill: 0 -     Semaglutide-Weight Management; Inject 0.5 mg into the skin once a week for 28 days.  Dispense: 2 mL; Refill: 0  Abnormal food  appetite Assessment & Plan: Improved on incretin therapy. She has increased orexigenic signaling, impaired satiety and inhibitory control. This is secondary to an abnormal energy regulation system and pathological neurohormonal pathways characteristic of excess adiposity.  In addition to nutritional and behavioral strategies she benefits from ongoing pharmacotherapy.       PHYSICAL EXAM:  Blood pressure 103/69, pulse 85, temperature 97.8 F (36.6 C), height 5\' 9"  (1.753 m), weight 231 lb (104.8 kg), SpO2 99%. Body mass index is 34.11 kg/m.  General: She is overweight, cooperative, alert, well developed, and in no acute distress. PSYCH: Has normal mood, affect and thought process.   HEENT: EOMI, sclerae are anicteric. Lungs: Normal breathing effort, no conversational dyspnea. Extremities: No edema.  Neurologic: No gross sensory or motor deficits. No tremors or fasciculations noted.    DIAGNOSTIC DATA REVIEWED:  BMET    Component Value Date/Time   NA 144 07/19/2023 1155   NA 143 02/22/2023 0744   K 4.6 07/19/2023 1155   CL 105 07/19/2023 1155   CO2 30 07/19/2023 1155   GLUCOSE 93 07/19/2023 1155   BUN 12 07/19/2023 1155   BUN 16 02/22/2023 0744   CREATININE 0.73 07/19/2023 1155   CALCIUM 10.0 07/19/2023 1155  GFRNONAA >60 05/20/2022 0155   GFRAA 101 11/25/2020 0919   Lab Results  Component Value Date   HGBA1C 5.6 11/04/2022   HGBA1C 5.3 12/26/2019   Lab Results  Component Value Date   INSULIN 13.6 11/04/2022   Lab Results  Component Value Date   TSH 1.250 01/13/2023   CBC    Component Value Date/Time   WBC 5.6 01/13/2023 1344   WBC 6.7 05/12/2022 0959   RBC 4.57 01/13/2023 1344   RBC 4.85 05/12/2022 0959   HGB 13.4 01/13/2023 1344   HCT 40.1 01/13/2023 1344   PLT 266 01/13/2023 1344   MCV 88 01/13/2023 1344   MCH 29.3 01/13/2023 1344   MCH 29.5 05/12/2022 0959   MCHC 33.4 01/13/2023 1344   MCHC 32.9 05/12/2022 0959   RDW 13.0 01/13/2023 1344   Iron  Studies    Component Value Date/Time   IRON 68 11/18/2022 1117   TIBC 329 11/18/2022 1117   FERRITIN 62 11/18/2022 1117   IRONPCTSAT 21 11/18/2022 1117   Lipid Panel     Component Value Date/Time   CHOL 201 (H) 01/13/2023 1344   TRIG 138 01/13/2023 1344   HDL 47 01/13/2023 1344   CHOLHDL 4.3 01/13/2023 1344   LDLCALC 129 (H) 01/13/2023 1344   Hepatic Function Panel     Component Value Date/Time   PROT 7.4 07/19/2023 1155   PROT 6.8 02/22/2023 0744   ALBUMIN 4.6 07/19/2023 1155   ALBUMIN 4.8 02/22/2023 0744   AST 14 07/19/2023 1155   ALT 15 07/19/2023 1155   ALKPHOS 90 07/19/2023 1155   BILITOT 0.3 07/19/2023 1155   BILITOT 0.5 02/22/2023 0744      Component Value Date/Time   TSH 1.250 01/13/2023 1344   Nutritional Lab Results  Component Value Date   VD25OH 49.8 04/19/2023   VD25OH 21.8 (L) 11/04/2022     Return in about 4 weeks (around 09/09/2023).Marland Kitchen She was informed of the importance of frequent follow up visits to maximize her success with intensive lifestyle modifications for her multiple health conditions.   ATTESTASTION STATEMENTS:  Reviewed by clinician on day of visit: allergies, medications, problem list, medical history, surgical history, family history, social history, and previous encounter notes.     Worthy Rancher, MD

## 2023-08-12 NOTE — Assessment & Plan Note (Signed)
Her most recent liver enzymes showed an improvement but she has had these wide fluctuations and is currently asymptomatic.  I had referred her to gastroenterology and appointment has been scheduled for October.  Her highest  AST is 231 and ALT is 440.  Alkaline phosphatase is 131.  Her liver enzymes had once improved after discontinuation of some of her psychotropic medication,  She had negative iron studies and hepatitis serologies.  She had liver ultrasound that showed hepatic steatosis.  Her fibrosis 4 Score = 1.86 (Indeterminate), so she needs further risk stratification.   She denies consumption of alcohol or use of over-the-counter supplements.  Because of elevations 2-3 times the upper limit of normal I still want her to see gastroenterology.  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 Considering fibrosis score she may benefit from fibrotest .  She has no symptoms of autoimmune conditions so we will hold off autoimmune workup and will defer to gastroenterology if they feel is necessary.  She has lost 15% of total body weight thus far.  She is also now on semaglutide 0.25 mg once a week this will be increased to 0.5 mg weekly.

## 2023-08-12 NOTE — Assessment & Plan Note (Signed)
 See obesity treatment plan

## 2023-09-03 ENCOUNTER — Other Ambulatory Visit (INDEPENDENT_AMBULATORY_CARE_PROVIDER_SITE_OTHER): Payer: Self-pay | Admitting: Internal Medicine

## 2023-09-03 DIAGNOSIS — E88819 Insulin resistance, unspecified: Secondary | ICD-10-CM

## 2023-09-03 DIAGNOSIS — E66812 Obesity, class 2: Secondary | ICD-10-CM

## 2023-09-03 DIAGNOSIS — K760 Fatty (change of) liver, not elsewhere classified: Secondary | ICD-10-CM

## 2023-09-12 ENCOUNTER — Other Ambulatory Visit (HOSPITAL_COMMUNITY): Payer: Self-pay | Admitting: Psychiatry

## 2023-09-12 DIAGNOSIS — F419 Anxiety disorder, unspecified: Secondary | ICD-10-CM

## 2023-09-12 DIAGNOSIS — F902 Attention-deficit hyperactivity disorder, combined type: Secondary | ICD-10-CM

## 2023-09-13 ENCOUNTER — Encounter (INDEPENDENT_AMBULATORY_CARE_PROVIDER_SITE_OTHER): Payer: Self-pay | Admitting: Internal Medicine

## 2023-09-13 ENCOUNTER — Ambulatory Visit (INDEPENDENT_AMBULATORY_CARE_PROVIDER_SITE_OTHER): Payer: Medicaid Other | Admitting: Internal Medicine

## 2023-09-13 VITALS — BP 114/73 | HR 61 | Temp 98.0°F | Ht 69.0 in | Wt 229.0 lb

## 2023-09-13 DIAGNOSIS — E88819 Insulin resistance, unspecified: Secondary | ICD-10-CM | POA: Diagnosis not present

## 2023-09-13 DIAGNOSIS — Z6836 Body mass index (BMI) 36.0-36.9, adult: Secondary | ICD-10-CM | POA: Diagnosis not present

## 2023-09-13 DIAGNOSIS — E66812 Obesity, class 2: Secondary | ICD-10-CM | POA: Diagnosis not present

## 2023-09-13 DIAGNOSIS — K760 Fatty (change of) liver, not elsewhere classified: Secondary | ICD-10-CM | POA: Diagnosis not present

## 2023-09-13 DIAGNOSIS — R638 Other symptoms and signs concerning food and fluid intake: Secondary | ICD-10-CM

## 2023-09-13 MED ORDER — METFORMIN HCL ER 500 MG PO TB24: 500.0000 mg | ORAL_TABLET | Freq: Two times a day (BID) | ORAL | 0 refills | Status: DC

## 2023-09-13 MED ORDER — SEMAGLUTIDE-WEIGHT MANAGEMENT 1 MG/0.5ML ~~LOC~~ SOAJ
1.0000 mg | SUBCUTANEOUS | 0 refills | Status: DC
Start: 2023-09-13 — End: 2023-10-19

## 2023-09-13 NOTE — Progress Notes (Signed)
Office: [phone]  /  Fax: [phone]  Weight Summary And Biometrics  Vitals Temp: 98 F (36.7 C) BP: 114/73 Pulse Rate: 61 SpO2: 99 %   Anthropometric Measurements Height: 5\' 9"  (1.753 m) Weight: 229 lb (103.9 kg) BMI (Calculated): 33.8 Weight at Last Visit: 231 lb Weight Lost Since Last Visit: 2 lb Weight Gained Since Last Visit: 0 lb Starting Weight: 268 lb Total Weight Loss (lbs): 38 lb (17.2 kg) Peak Weight: 450 lb   Body Composition  Body Fat %: 43.4 % Fat Mass (lbs): 99.6 lbs Muscle Mass (lbs): 123.4 lbs Total Body Water (lbs): 86 lbs Visceral Fat Rating : 10    No data recorded Today's Visit #: 15  Starting Date: 08/18/22   Subjective   Chief Complaint: Obesity  Sarah Salas is here to discuss her progress with her obesity treatment plan. She is on the the Category 2 Plan and states she is following her eating plan approximately 90 % of the time. She states she is exercising 120 minutes 5 times per week.  Interval History:   Since last office visit she has lost 2 pounds. She reports good adherence to reduced calorie nutritional plan. She has been working on reading food labels, not skipping meals, increasing protein intake at every meal, drinking more water, making healthier choices, continues to exercise, reducing portion sizes, and incorporating more whole foods   Orexigenic Control:  Denies problems with appetite and hunger signals.  Denies problems with satiety and satiation.  Denies problems with eating patterns and portion control.  Denies abnormal cravings. Denies feeling deprived or restricted.   Barriers identified: none.   Pharmacotherapy for weight loss: She is currently taking Wegovy with adequate clinical response  and without side effects..   Assessment and Plan   Treatment Plan For Obesity:  Recommended Dietary Goals  [name] is currently in the action stage of change. As such, her goal is to continue weight management  plan. She has agreed to: continue current plan  Behavioral Intervention  We discussed the following Behavioral Modification Strategies today: continue to work on maintaining a reduced calorie state, getting the recommended amount of protein, incorporating whole foods, making healthy choices, staying well hydrated and practicing mindfulness when eating..  Additional resources provided today: None  Recommended Physical Activity Goals  [name] has been advised to work up to 150 minutes of moderate intensity aerobic activity a week and strengthening exercises 2-3 times per week for cardiovascular health, weight loss maintenance and preservation of muscle mass.   She has agreed to :  continue to gradually increase the amount and intensity of exercise   Pharmacotherapy  We discussed various medication options to help Windie with her weight loss efforts and we both agreed to : increase Wegovy to 1 mg once a week  Associated Conditions Addressed Today  Class 2 severe obesity with serious comorbidity and body mass index (BMI) of 36.0 to 36.9 in adult, unspecified obesity type [facility]) Assessment & Plan: Peak Weight: 450 Starting Weight : 268  Current Weight: 229 Working BMR: 2102 Weight loss goal: 165 Nutritional: Cat 11-1198 calories Past Pharmacotherapy: None Current Pharmacotherapy: Wegovy ( start 07/2023) and Metformin  See obesity treatment plan     Insulin resistance Assessment & Plan: Her HOMA-IR is 3.15 which is elevated. Optimal level < 1.9. This is complex condition associated with genetics, ectopic fat and lifestyle factors. Insulin resistance may result in weight gain, abnormal cravings (particularly for carbs) and fatigue. This may result in additional weight  gain and lead to pre-diabetes and diabetes if untreated.   Lab Results  Component Value Date   HGBA1C 5.6 11/04/2022   Lab Results  Component Value Date   INSULIN 13.6 11/04/2022   Lab Results  Component Value Date    GLUCOSE 93 07/19/2023   GLUCOSE 87 11/19/2017   Continue metformin and Wegovy for pharmacoprophylaxis we may consider discontinuing metformin at the next office visit.  Orders: -     metFORMIN HCl ER; Take 1 tablet (500 mg total) by mouth 2 (two) times daily with a meal.  Dispense: 180 tablet; Refill: 0  Metabolic dysfunction-associated steatotic liver disease (MASLD) Assessment & Plan: Her most recent liver enzymes showed an improvement but she has had these wide fluctuations and is currently asymptomatic.  I had referred her to gastroenterology and appointment was seen in October for which further workup was recommended.  Her highest  AST is 231 and ALT is 440.  Alkaline phosphatase is 131.  Her liver enzymes had once improved after discontinuation of some of her psychotropic medication,  She had negative iron studies and hepatitis serologies.  She had liver ultrasound that showed hepatic steatosis.  Her fibrosis 4 Score = 1.86 (Indeterminate), so she needs further risk stratification.   She denies consumption of alcohol or use of over-the-counter supplements.   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 Considering fibrosis score she may benefit from fibrotest .  Gastroenterology has recommended autoimmune workup.  In addition to nutritional and behavioral strategies she will continue on semaglutide which may also help with hepatic steatosis.   Abnormal food appetite Assessment & Plan: Improved on incretin therapy. She has increased orexigenic signaling, impaired satiety and inhibitory control. This is secondary to an abnormal energy regulation system and pathological neurohormonal pathways characteristic of excess adiposity.  In addition to nutritional and behavioral strategies she benefits from ongoing pharmacotherapy.  We will increase Wegovy to 1 mg once a week    Other orders -     Semaglutide-Weight Management; Inject 1 mg into the skin once a week  for 28 days.  Dispense: 2 mL; Refill: 0     Objective   Physical Exam:  Blood pressure 114/73, pulse 61, temperature 98 F (36.7 C), height 5\' 9"  (1.753 m), weight 229 lb (103.9 kg), SpO2 99%. Body mass index is 33.82 kg/m.  General: She is overweight, cooperative, alert, well developed, and in no acute distress. PSYCH: Has normal mood, affect and thought process.   HEENT: EOMI, sclerae are anicteric. Lungs: Normal breathing effort, no conversational dyspnea. Extremities: No edema.  Neurologic: No gross sensory or motor deficits. No tremors or fasciculations noted.    Diagnostic Data Reviewed:  BMET    Component Value Date/Time   NA 144 07/19/2023 1155   NA 143 02/22/2023 0744   K 4.6 07/19/2023 1155   CL 105 07/19/2023 1155   CO2 30 07/19/2023 1155   GLUCOSE 93 07/19/2023 1155   BUN 12 07/19/2023 1155   BUN 16 02/22/2023 0744   CREATININE 0.73 07/19/2023 1155   CALCIUM 10.0 07/19/2023 1155   GFRNONAA >60 05/20/2022 0155   GFRAA 101 11/25/2020 0919   Lab Results  Component Value Date   HGBA1C 5.6 11/04/2022   HGBA1C 5.3 12/26/2019   Lab Results  Component Value Date   INSULIN 13.6 11/04/2022   Lab Results  Component Value Date   TSH 1.250 01/13/2023   CBC    Component Value Date/Time   WBC  5.6 01/13/2023 1344   WBC 6.7 05/12/2022 0959   RBC 4.57 01/13/2023 1344   RBC 4.85 05/12/2022 0959   HGB 13.4 01/13/2023 1344   HCT 40.1 01/13/2023 1344   PLT 266 01/13/2023 1344   MCV 88 01/13/2023 1344   MCH 29.3 01/13/2023 1344   MCH 29.5 05/12/2022 0959   MCHC 33.4 01/13/2023 1344   MCHC 32.9 05/12/2022 0959   RDW 13.0 01/13/2023 1344   Iron Studies    Component Value Date/Time   IRON 68 11/18/2022 1117   TIBC 329 11/18/2022 1117   FERRITIN 62 11/18/2022 1117   IRONPCTSAT 21 11/18/2022 1117   Lipid Panel     Component Value Date/Time   CHOL 201 (H) 01/13/2023 1344   TRIG 138 01/13/2023 1344   HDL 47 01/13/2023 1344   CHOLHDL 4.3 01/13/2023 1344    LDLCALC 129 (H) 01/13/2023 1344   Hepatic Function Panel     Component Value Date/Time   PROT 7.4 07/19/2023 1155   PROT 6.8 02/22/2023 0744   ALBUMIN 4.6 07/19/2023 1155   ALBUMIN 4.8 02/22/2023 0744   AST 14 07/19/2023 1155   ALT 15 07/19/2023 1155   ALKPHOS 90 07/19/2023 1155   BILITOT 0.3 07/19/2023 1155   BILITOT 0.5 02/22/2023 0744      Component Value Date/Time   TSH 1.250 01/13/2023 1344   Nutritional Lab Results  Component Value Date   VD25OH 49.8 04/19/2023   VD25OH 21.8 (L) 11/04/2022    Follow-Up   Return in about 4 weeks (around 10/11/2023) for For Weight Mangement with Dr. Rikki Spearing.Marland Kitchen She was informed of the importance of frequent follow up visits to maximize her success with intensive lifestyle modifications for her multiple health conditions.  Attestation Statement   Reviewed by clinician on day of visit: allergies, medications, problem list, medical history, surgical history, family history, social history, and previous encounter notes.     Worthy Rancher, MD

## 2023-09-13 NOTE — Assessment & Plan Note (Signed)
Her most recent liver enzymes showed an improvement but she has had these wide fluctuations and is currently asymptomatic.  I had referred her to gastroenterology and appointment was seen in October for which further workup was recommended.  Her highest  AST is 231 and ALT is 440.  Alkaline phosphatase is 131.  Her liver enzymes had once improved after discontinuation of some of her psychotropic medication,  She had negative iron studies and hepatitis serologies.  She had liver ultrasound that showed hepatic steatosis.  Her fibrosis 4 Score = 1.86 (Indeterminate), so she needs further risk stratification.   She denies consumption of alcohol or use of over-the-counter supplements.   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 Considering fibrosis score she may benefit from fibrotest .  Gastroenterology has recommended autoimmune workup.  In addition to nutritional and behavioral strategies she will continue on semaglutide which may also help with hepatic steatosis.

## 2023-09-13 NOTE — Assessment & Plan Note (Signed)
Her HOMA-IR is 3.15 which is elevated. Optimal level < 1.9. This is complex condition associated with genetics, ectopic fat and lifestyle factors. Insulin resistance may result in weight gain, abnormal cravings (particularly for carbs) and fatigue. This may result in additional weight gain and lead to pre-diabetes and diabetes if untreated.   Lab Results  Component Value Date   HGBA1C 5.6 11/04/2022   Lab Results  Component Value Date   INSULIN 13.6 11/04/2022   Lab Results  Component Value Date   GLUCOSE 93 07/19/2023   GLUCOSE 87 11/19/2017   Continue metformin and Wegovy for pharmacoprophylaxis we may consider discontinuing metformin at the next office visit.

## 2023-09-13 NOTE — Assessment & Plan Note (Signed)
Peak Weight: 450 Starting Weight : 268  Current Weight: 229 Working BMR: 2102 Weight loss goal: 165 Nutritional: Cat 11-1198 calories Past Pharmacotherapy: None Current Pharmacotherapy: Wegovy ( start 07/2023) and Metformin  See obesity treatment plan

## 2023-09-13 NOTE — Assessment & Plan Note (Signed)
Improved on incretin therapy. She has increased orexigenic signaling, impaired satiety and inhibitory control. This is secondary to an abnormal energy regulation system and pathological neurohormonal pathways characteristic of excess adiposity.  In addition to nutritional and behavioral strategies she benefits from ongoing pharmacotherapy.  We will increase Wegovy to 1 mg once a week

## 2023-09-21 ENCOUNTER — Other Ambulatory Visit: Payer: Self-pay | Admitting: Nurse Practitioner

## 2023-09-21 ENCOUNTER — Encounter: Payer: Self-pay | Admitting: Nurse Practitioner

## 2023-09-21 DIAGNOSIS — M545 Low back pain, unspecified: Secondary | ICD-10-CM

## 2023-09-21 MED ORDER — IBUPROFEN 800 MG PO TABS
800.0000 mg | ORAL_TABLET | Freq: Three times a day (TID) | ORAL | 1 refills | Status: DC | PRN
Start: 2023-09-21 — End: 2024-01-25

## 2023-09-21 MED ORDER — METHOCARBAMOL 500 MG PO TABS: 500.0000 mg | ORAL_TABLET | Freq: Four times a day (QID) | ORAL | 1 refills | Status: DC

## 2023-09-23 ENCOUNTER — Telehealth (HOSPITAL_COMMUNITY): Payer: Medicaid Other | Admitting: Psychiatry

## 2023-09-23 ENCOUNTER — Encounter (INDEPENDENT_AMBULATORY_CARE_PROVIDER_SITE_OTHER): Payer: Self-pay | Admitting: Internal Medicine

## 2023-09-23 DIAGNOSIS — F902 Attention-deficit hyperactivity disorder, combined type: Secondary | ICD-10-CM | POA: Diagnosis not present

## 2023-09-23 DIAGNOSIS — F319 Bipolar disorder, unspecified: Secondary | ICD-10-CM

## 2023-09-23 DIAGNOSIS — F419 Anxiety disorder, unspecified: Secondary | ICD-10-CM

## 2023-09-23 MED ORDER — LAMOTRIGINE 200 MG PO TABS: 200.0000 mg | ORAL_TABLET | Freq: Every day | ORAL | 2 refills | Status: DC

## 2023-09-23 MED ORDER — CITALOPRAM HYDROBROMIDE 20 MG PO TABS: 20.0000 mg | ORAL_TABLET | Freq: Every day | ORAL | 2 refills | Status: DC

## 2023-09-23 MED ORDER — TEMAZEPAM 15 MG PO CAPS: 15.0000 mg | ORAL_CAPSULE | Freq: Every evening | ORAL | 2 refills | Status: DC | PRN

## 2023-09-23 NOTE — Progress Notes (Signed)
[facility] MD Virtual Progress Note   Patient Location: Home Provider Location: Office  I connect with patient by video and verified that I am speaking with correct person by using two identifiers. I discussed the limitations of evaluation and management by telemedicine and the availability of in person appointments. I also discussed with the patient that there may be a patient responsible charge related to this service. The patient expressed understanding and agreed to proceed.  Sarah Salas [id number] 47 y.o.  09/23/2023 8:37 AM  History of Present Illness:  Patient is evaluated by video session.  She is doing well and like sleep medication.  She is getting at least 7 to 8 hours.  She reported her mood is also stable and denies any recent agitation, mania.  However she is concerned about weight.  Despite watching her calorie intake and active she has not able to lose weight.  Recently her weight loss provider started her on injection.  She is wondering if she can reduce the Lamictal dose if that is causing weight gain.  She denies any hallucination, paranoia or any suicidal thoughts.  Recently she had a visit with her GI and labs normal.  She is spending more time with her mother and getting close to the religion.  She had a good relationship with her sister, boyfriend and mother.  She has no rash, itching tremors or shakes.  She is taking Lamictal, temazepam Celexa.  Her energy level is good.  Past Psychiatric History: H/O ADHD, bipolar disorder. Vyvanse, Strattera, Hydroxyzine and Trazodone did not help.  Adderall helped.  Latuda helped in the beginning but then got worse with increased dose.  No h/o paranoia, inpatient treatment or suicidal attempt. H/O TBI.  Gabapentin, Restoril and Abilify helped but caused increased weight gain, liver enzyme and blood sugar.     Outpatient Encounter Medications as of 09/23/2023  Medication Sig   citalopram (CELEXA) 20 MG tablet Take 1  tablet (20 mg total) by mouth daily.   diclofenac Sodium (VOLTAREN) 1 % GEL Apply 4 g topically 4 (four) times daily.   ergocalciferol (VITAMIN D2) 1.25 MG (50000 UT) capsule Take 1 capsule (50,000 Units total) by mouth once a week.   ibuprofen (ADVIL) 800 MG tablet Take 1 tablet (800 mg total) by mouth every 8 (eight) hours as needed.   lamoTRIgine (LAMICTAL) 200 MG tablet Take 1 tablet (200 mg total) by mouth daily.   latanoprost (XALATAN) 0.005 % ophthalmic solution Place 1 drop into both eyes at bedtime.   metFORMIN (GLUCOPHAGE-XR) 500 MG 24 hr tablet Take 1 tablet (500 mg total) by mouth 2 (two) times daily with a meal.   methocarbamol (ROBAXIN) 500 MG tablet Take 1 tablet (500 mg total) by mouth 4 (four) times daily.   Semaglutide-Weight Management 1 MG/0.5ML SOAJ Inject 1 mg into the skin once a week for 28 days.   temazepam (RESTORIL) 15 MG capsule Take 1 capsule (15 mg total) by mouth at bedtime as needed for sleep.   No facility-administered encounter medications on file as of 09/23/2023.    Recent Results (from the past 2160 hours)  Comprehensive metabolic panel     Status: None   Collection Time: 07/19/23 11:55 AM  Result Value Ref Range   Sodium 144 135 - 145 mEq/L   Potassium 4.6 3.5 - 5.1 mEq/L   Chloride 105 96 - 112 mEq/L   CO2 30 19 - 32 mEq/L   Glucose, Bld 93 70 - 99 mg/dL  BUN 12 6 - 23 mg/dL   Creatinine, Ser 1.61 0.40 - 1.20 mg/dL   Total Bilirubin 0.3 0.2 - 1.2 mg/dL   Alkaline Phosphatase 90 39 - 117 U/L   AST 14 0 - 37 U/L   ALT 15 0 - 35 U/L   Total Protein 7.4 6.0 - 8.3 g/dL   Albumin 4.6 3.5 - 5.2 g/dL   GFR 09.60 >45.40 mL/min    Comment: Calculated using the CKD-EPI Creatinine Equation (2021)   Calcium 10.0 8.4 - 10.5 mg/dL  INR/PT     Status: None   Collection Time: 07/19/23 11:55 AM  Result Value Ref Range   INR 1.0 0.8 - 1.0 ratio   Prothrombin Time 10.4 9.6 - 13.1 sec  IgG     Status: None   Collection Time: 07/19/23 11:55 AM  Result Value  Ref Range   IgG (Immunoglobin G), Serum 859 600 - 1,640 mg/dL  [id number]     Status: None   Collection Time: 07/19/23 11:55 AM  Result Value Ref Range   A-1 Antitrypsin, Ser 168 83 - 199 mg/dL  Mitochondrial antibodies     Status: None   Collection Time: 07/19/23 11:55 AM  Result Value Ref Range   Mitochondrial M2 Ab, IgG <=20.0 <=20.0 U    Comment: . Reference Range:    Negative:  <=20.0      U    Equivocal: 20.1 - 24.9 U    Positive:  >=25.0      U .   ANA     Status: None   Collection Time: 07/19/23 11:55 AM  Result Value Ref Range   Anti Nuclear Antibody (ANA) NEGATIVE NEGATIVE    Comment: ANA IFA is a first line screen for detecting the presence of up to approximately 150 autoantibodies in various autoimmune diseases. A negative ANA IFA result suggests an ANA-associated autoimmune disease is not present at this time, but is not definitive. If there is high clinical suspicion for Sjogren's syndrome, testing for anti-SS-A/Ro antibody should be considered. Anti-Jo-1 antibody should be considered for clinically suspected inflammatory myopathies. . AC-0: Negative . International Consensus on ANA Patterns (SeverTies.uy) . For additional information, please refer to http://education.[facility].com/faq/FAQ177 (This link is being provided for informational/ educational purposes only.) .   Anti-smooth muscle antibody, IgG     Status: None   Collection Time: 07/19/23 11:55 AM  Result Value Ref Range   Actin (Smooth Muscle) Antibody (IGG) <20 <20 U    Comment: . Reference Range:    <20 U: Negative >or=20 U: Positive . Marland Kitchen Antibodies recognizing actin are the main component of smooth muscle antibodies associated with auto- immune liver disease. Actin antibodies are found in approximately 75% of patients with autoimmune hepatitis (AIH) type 1, approximately 65% of patients with autoimmune cholangitis, approximately 30%  of patients with primary biliary cirrhosis and approximately 2% of healthy controls. High values are closely correlated with AIH type 1. .   Hepatitis A antibody, total     Status: None   Collection Time: 07/19/23 11:55 AM  Result Value Ref Range   Hepatitis A AB,Total NON-REACTIVE NON-REACTIVE    Comment: . For additional information, please refer to  http://education.[facility].com/faq/FAQ202  (This link is being provided for informational/ educational purposes only.) .   Ceruloplasmin     Status: None   Collection Time: 07/19/23 11:55 AM  Result Value Ref Range   Ceruloplasmin 23 14 - 48 mg/dL  TEST AUTHORIZATION 2     Status: None  Collection Time: 07/19/23 11:55 AM  Result Value Ref Range   TEST NAME: CERULOPLASMIN    TEST CODE: 326SB    CLIENT CONTACT: Y DORSEY    REPORT ALWAYS MESSAGE SIGNATURE      Comment: . The laboratory testing on this patient was verbally requested or confirmed by the ordering physician or his or her authorized representative after contact with an employee of [employer]. Federal regulations require that we maintain on file written authorization for all laboratory testing.  Accordingly we are asking that the ordering physician or his or her authorized representative sign a copy of this report and promptly return it to the client service representative. . . Signature:____________________________________________________ . Please fax this signed page to [phone] or return it via your [employer] courier.      Psychiatric Specialty Exam: Physical Exam  Review of Systems  Weight 228 lb (103.4 kg).Body mass index is 33.67 kg/m.  General Appearance: Casual  Eye Contact:  Good  Speech:   fast  Volume:  Normal  Mood:  Euthymic  Affect:  Labile  Thought Process:  Goal Directed  Orientation:  Full (Time, Place, and Person)  Thought Content:  Logical  Suicidal Thoughts:  No  Homicidal Thoughts:  No  Memory:   Immediate;   Fair Recent;   Fair Remote;   Fair  Judgement:  Intact  Insight:  Present  Psychomotor Activity:  Increased  Concentration:  Concentration: Fair and Attention Span: Fair  Recall:  [employer] of Knowledge:  Fair  Language:  Good  Akathisia:  No  Handed:  Right  AIMS (if indicated):     Assets:  Communication Skills Desire for Improvement Housing Resilience Social Support Transportation  ADL's:  Intact  Cognition:  WNL  Sleep:  good     Assessment/Plan: Anxiety - Plan: citalopram (CELEXA) 20 MG tablet, lamoTRIgine (LAMICTAL) 200 MG tablet, temazepam (RESTORIL) 15 MG capsule  Attention deficit hyperactivity disorder (ADHD), combined type - Plan: citalopram (CELEXA) 20 MG tablet, lamoTRIgine (LAMICTAL) 200 MG tablet  Bipolar I disorder (HCC) - Plan: lamoTRIgine (LAMICTAL) 200 MG tablet, temazepam (RESTORIL) 15 MG capsule  Patient doing better on current medication.  No significant change in her weight despite watching her calorie intake, active and exercising.  She recently started weight loss medication.  I encouraged not to Lamictal for now since new medicine added for weight loss.  However if no significant weight loss the medication will consider reducing the Lamictal.  She is taking Lamictal for a while and it is unclear if that will causing weight gain.  Patient agreed with the plan.  Continue Lamictal 200 mg daily, temazepam 15 mg at bedtime and Celexa 20 mg daily.  Recommended to call us back if she has any question or any concern.  Follow-up in 3 months.   Follow Up Instructions:     I discussed the assessment and treatment plan with the patient. The patient was provided an opportunity to ask questions and all were answered. The patient agreed with the plan and demonstrated an understanding of the instructions.   The patient was advised to call back or seek an in-person evaluation if the symptoms worsen or if the condition fails to improve as  anticipated.    Collaboration of Care: Other provider involved in patient's care AEB notes are available in epic to review  Patient/Guardian was advised Release of Information must be obtained prior to any record release in order to collaborate their care with an outside  provider. Patient/Guardian was advised if they have not already done so to contact the registration department to sign all necessary forms in order for [place] to release information regarding their care.   Consent: Patient/Guardian gives verbal consent for treatment and assignment of benefits for services provided during this visit. Patient/Guardian expressed understanding and agreed to proceed.     I provided 16 minutes of non face to face time during this encounter.  Note: This document was prepared by [employer] voice dictation technology and any errors that results from this process are unintentional.    Cleotis Nipper, MD 09/23/2023

## 2023-10-05 ENCOUNTER — Other Ambulatory Visit (INDEPENDENT_AMBULATORY_CARE_PROVIDER_SITE_OTHER): Payer: Self-pay | Admitting: Internal Medicine

## 2023-10-17 ENCOUNTER — Encounter (INDEPENDENT_AMBULATORY_CARE_PROVIDER_SITE_OTHER): Payer: Self-pay | Admitting: Internal Medicine

## 2023-10-19 ENCOUNTER — Encounter (INDEPENDENT_AMBULATORY_CARE_PROVIDER_SITE_OTHER): Payer: Self-pay | Admitting: Internal Medicine

## 2023-10-19 ENCOUNTER — Ambulatory Visit (INDEPENDENT_AMBULATORY_CARE_PROVIDER_SITE_OTHER): Payer: Medicaid Other | Admitting: Internal Medicine

## 2023-10-19 VITALS — BP 110/71 | HR 78 | Temp 98.1°F | Ht 69.0 in | Wt 226.0 lb

## 2023-10-19 DIAGNOSIS — R112 Nausea with vomiting, unspecified: Secondary | ICD-10-CM | POA: Diagnosis not present

## 2023-10-19 DIAGNOSIS — E88819 Insulin resistance, unspecified: Secondary | ICD-10-CM

## 2023-10-19 DIAGNOSIS — K760 Fatty (change of) liver, not elsewhere classified: Secondary | ICD-10-CM | POA: Diagnosis not present

## 2023-10-19 DIAGNOSIS — E66812 Obesity, class 2: Secondary | ICD-10-CM

## 2023-10-19 DIAGNOSIS — Z6836 Body mass index (BMI) 36.0-36.9, adult: Secondary | ICD-10-CM

## 2023-10-19 MED ORDER — ONDANSETRON 4 MG PO TBDP: 4.0000 mg | ORAL_TABLET | Freq: Three times a day (TID) | ORAL | 0 refills | Status: DC | PRN

## 2023-10-19 MED ORDER — SEMAGLUTIDE-WEIGHT MANAGEMENT 1 MG/0.5ML ~~LOC~~ SOAJ: 1.0000 mg | SUBCUTANEOUS | 0 refills | Status: DC

## 2023-10-19 NOTE — Assessment & Plan Note (Signed)
 Self-limiting and associated with GLP-1.  Patient to keep a journal to make sure that is not dietary incompatibility with medication.  For the most part she avoids fatty foods and sugary foods but has noticed an increase in fudge.  She will be prescribed ondansetron  4 mg every 8 hours as needed.  Do not recommend increasing GLP-1 at present time due to nausea.  Continue current dose

## 2023-10-19 NOTE — Assessment & Plan Note (Signed)
 Peak Weight: 450 Starting Weight : 268  Current Weight: 226 Working BMR: 2102 Weight loss goal: 165 Nutritional: Cat 11-1198 calories Past Pharmacotherapy: None Current Pharmacotherapy: Wegovy  ( start 07/2023) and Metformin   See obesity treatment plan

## 2023-10-19 NOTE — Assessment & Plan Note (Signed)
 Her HOMA-IR is 3.15 which is elevated. Optimal level < 1.9. This is complex condition associated with genetics, ectopic fat and lifestyle factors. Insulin resistance may result in weight gain, abnormal cravings (particularly for carbs) and fatigue. This may result in additional weight gain and lead to pre-diabetes and diabetes if untreated.   Lab Results  Component Value Date   HGBA1C 5.6 11/04/2022   Lab Results  Component Value Date   INSULIN 13.6 11/04/2022   Lab Results  Component Value Date   GLUCOSE 93 07/19/2023   GLUCOSE 87 11/19/2017   Continue metformin and Wegovy for pharmacoprophylaxis we may consider discontinuing metformin at the next office visit.

## 2023-10-19 NOTE — Progress Notes (Signed)
 Office: [phone]  /  Fax: [phone]  Weight Summary And Biometrics  Vitals Temp: 98.1 F (36.7 C) BP: 110/71 Pulse Rate: 78 SpO2: 99 %   Anthropometric Measurements Height: 5' 9 (1.753 m) Weight: 226 lb (102.5 kg) BMI (Calculated): 33.36 Weight at Last Visit: 229 lb Weight Lost Since Last Visit: 3 lb Weight Gained Since Last Visit: 0 lb Starting Weight: 268 lb Total Weight Loss (lbs): 41 lb (18.6 kg) Peak Weight: 450 lb   Body Composition  Body Fat %: 42.5 % Fat Mass (lbs): 96 lbs Muscle Mass (lbs): 123.4 lbs Total Body Water (lbs): 83.4 lbs Visceral Fat Rating : 10    No data recorded Today's Visit #: 16  Starting Date: 08/28/22   Subjective   Chief Complaint: Obesity  Sarah Salas is here to discuss her progress with her obesity treatment plan. She is on the the Category 2 Plan and states she is following her eating plan approximately 75 % of the time. She states she is exercising 90 minutes 7 times per week.  Interval History:   Since last office visit she has lost 3 pounds. She reports good adherence to reduced calorie nutritional plan. She has been working on reading food labels, not skipping meals, increasing protein intake at every meal, drinking more water, making healthier choices, reducing portion sizes, and incorporating more whole foods   Orexigenic Control:  Denies problems with appetite and hunger signals.  Denies problems with satiety and satiation.  Denies problems with eating patterns and portion control.  Denies abnormal cravings. Denies feeling deprived or restricted.   Barriers identified: none.   Pharmacotherapy for weight loss: She is currently taking Wegovy  with adequate clinical response  and experiencing the following side effects: Self-limiting nausea some days after injection..   Assessment and Plan   Treatment Plan For Obesity:  Recommended Dietary Goals  Sarah Salas is currently in the action stage of change. As such,  her goal is to continue weight management plan. She has agreed to: continue current plan  Behavioral Intervention  We discussed the following Behavioral Modification Strategies today: continue to work on maintaining a reduced calorie state, getting the recommended amount of protein, incorporating whole foods, making healthy choices, staying well hydrated and practicing mindfulness when eating..  Additional resources provided today: None  Recommended Physical Activity Goals  Sarah Salas has been advised to work up to 150 minutes of moderate intensity aerobic activity a week and strengthening exercises 2-3 times per week for cardiovascular health, weight loss maintenance and preservation of muscle mass.   She has agreed to :  Think about enjoyable ways to increase daily physical activity and overcoming barriers to exercise and Increase physical activity in their day and reduce sedentary time (increase NEAT).  Pharmacotherapy  We discussed various medication options to help Sarah Salas with her weight loss efforts and we both agreed to : continue current anti-obesity medication regimen and I do not recommend increasing Wegovy  at present time due to nausea it would likely take her an additional 4 weeks at current dose to develop tolerance.  Associated Conditions Addressed and Impacted by Obesity Treatment  Nausea and vomiting, unspecified vomiting type Assessment & Plan: Self-limiting and associated with GLP-1.  Patient to keep a journal to make sure that is not dietary incompatibility with medication.  For the most part she avoids fatty foods and sugary foods but has noticed an increase in fudge.  She will be prescribed ondansetron  4 mg every 8 hours as needed.  Do not  recommend increasing GLP-1 at present time due to nausea.  Continue current dose  Orders: -     Ondansetron ; Take 1 tablet (4 mg total) by mouth every 8 (eight) hours as needed for nausea or vomiting.  Dispense: 20 tablet; Refill:  0  Insulin  resistance Assessment & Plan: Her HOMA-IR is 3.15 which is elevated. Optimal level < 1.9. This is complex condition associated with genetics, ectopic fat and lifestyle factors. Insulin  resistance may result in weight gain, abnormal cravings (particularly for carbs) and fatigue. This may result in additional weight gain and lead to pre-diabetes and diabetes if untreated.   Lab Results  Component Value Date   HGBA1C 5.6 11/04/2022   Lab Results  Component Value Date   INSULIN  13.6 11/04/2022   Lab Results  Component Value Date   GLUCOSE 93 07/19/2023   GLUCOSE 87 11/19/2017   Continue metformin  and Wegovy  for pharmacoprophylaxis we may consider discontinuing metformin  at the next office visit.  Orders: -     Semaglutide -Weight Management; Inject 1 mg into the skin once a week for 28 days.  Dispense: 2 mL; Refill: 0  Metabolic dysfunction-associated steatotic liver disease (MASLD) Assessment & Plan: Her most recent liver enzymes are normal.  She has lost more than 15% of total body weight so anticipate improvement in condition.  She will continue on GLP-1 therapy  Orders: -     Semaglutide -Weight Management; Inject 1 mg into the skin once a week for 28 days.  Dispense: 2 mL; Refill: 0  Class 2 severe obesity with serious comorbidity and body mass index (BMI) of 36.0 to 36.9 in adult, unspecified obesity type [facility]) Assessment & Plan: Peak Weight: 450 Starting Weight : 268  Current Weight: 226 Working BMR: 2102 Weight loss goal: 165 Nutritional: Cat 11-1198 calories Past Pharmacotherapy: None Current Pharmacotherapy: Wegovy  ( start 07/2023) and Metformin   See obesity treatment plan    Orders: -     Semaglutide -Weight Management; Inject 1 mg into the skin once a week for 28 days.  Dispense: 2 mL; Refill: 0     Objective   Physical Exam:  Blood pressure 110/71, pulse 78, temperature 98.1 F (36.7 C), height 5' 9 (1.753 m), weight 226 lb (102.5 kg), SpO2  99%. Body mass index is 33.37 kg/m.  General: She is overweight, cooperative, alert, well developed, and in no acute distress. PSYCH: Has normal mood, affect and thought process.   HEENT: EOMI, sclerae are anicteric. Lungs: Normal breathing effort, no conversational dyspnea. Extremities: No edema.  Neurologic: No gross sensory or motor deficits. No tremors or fasciculations noted.    Diagnostic Data Reviewed:  BMET    Component Value Date/Time   NA 144 07/19/2023 1155   NA 143 02/22/2023 0744   K 4.6 07/19/2023 1155   CL 105 07/19/2023 1155   CO2 30 07/19/2023 1155   GLUCOSE 93 07/19/2023 1155   BUN 12 07/19/2023 1155   BUN 16 02/22/2023 0744   CREATININE 0.73 07/19/2023 1155   CALCIUM 10.0 07/19/2023 1155   GFRNONAA >60 05/20/2022 0155   GFRAA 101 11/25/2020 0919   Lab Results  Component Value Date   HGBA1C 5.6 11/04/2022   HGBA1C 5.3 12/26/2019   Lab Results  Component Value Date   INSULIN  13.6 11/04/2022   Lab Results  Component Value Date   TSH 1.250 01/13/2023   CBC    Component Value Date/Time   WBC 5.6 01/13/2023 1344   WBC 6.7 05/12/2022 0959   RBC 4.57 01/13/2023  1344   RBC 4.85 05/12/2022 0959   HGB 13.4 01/13/2023 1344   HCT 40.1 01/13/2023 1344   PLT 266 01/13/2023 1344   MCV 88 01/13/2023 1344   MCH 29.3 01/13/2023 1344   MCH 29.5 05/12/2022 0959   MCHC 33.4 01/13/2023 1344   MCHC 32.9 05/12/2022 0959   RDW 13.0 01/13/2023 1344   Iron Studies    Component Value Date/Time   IRON 68 11/18/2022 1117   TIBC 329 11/18/2022 1117   FERRITIN 62 11/18/2022 1117   IRONPCTSAT 21 11/18/2022 1117   Lipid Panel     Component Value Date/Time   CHOL 201 (H) 01/13/2023 1344   TRIG 138 01/13/2023 1344   HDL 47 01/13/2023 1344   CHOLHDL 4.3 01/13/2023 1344   LDLCALC 129 (H) 01/13/2023 1344   Hepatic Function Panel     Component Value Date/Time   PROT 7.4 07/19/2023 1155   PROT 6.8 02/22/2023 0744   ALBUMIN 4.6 07/19/2023 1155   ALBUMIN 4.8  02/22/2023 0744   AST 14 07/19/2023 1155   ALT 15 07/19/2023 1155   ALKPHOS 90 07/19/2023 1155   BILITOT 0.3 07/19/2023 1155   BILITOT 0.5 02/22/2023 0744      Component Value Date/Time   TSH 1.250 01/13/2023 1344   Nutritional Lab Results  Component Value Date   VD25OH 49.8 04/19/2023   VD25OH 21.8 (L) 11/04/2022    Follow-Up   Return in about 4 weeks (around 11/16/2023) for For Weight Mangement with Dr. Francyne.SABRA She was informed of the importance of frequent follow up visits to maximize her success with intensive lifestyle modifications for her multiple health conditions.  Attestation Statement   Reviewed by clinician on day of visit: allergies, medications, problem list, medical history, surgical history, family history, social history, and previous encounter notes.     Lucas Francyne, MD

## 2023-10-19 NOTE — Assessment & Plan Note (Signed)
 Her most recent liver enzymes are normal.  She has lost more than 15% of total body weight so anticipate improvement in condition.  She will continue on GLP-1 therapy

## 2023-10-20 ENCOUNTER — Other Ambulatory Visit (INDEPENDENT_AMBULATORY_CARE_PROVIDER_SITE_OTHER): Payer: Self-pay | Admitting: Internal Medicine

## 2023-10-20 ENCOUNTER — Telehealth: Payer: Self-pay

## 2023-10-20 MED ORDER — SEMAGLUTIDE-WEIGHT MANAGEMENT 1 MG/0.5ML ~~LOC~~ SOAJ: 1.0000 mg | SUBCUTANEOUS | 0 refills | Status: DC

## 2023-10-20 NOTE — Telephone Encounter (Signed)
 PA submitted for Ozempic through Cover My Meds. Awaiting insurance determination.  Key: [id number]

## 2023-10-21 NOTE — Telephone Encounter (Signed)
 Ozempic denied through patients insurance. Does not meet medical criteria.

## 2023-11-02 ENCOUNTER — Encounter: Payer: Self-pay | Admitting: Internal Medicine

## 2023-11-02 ENCOUNTER — Other Ambulatory Visit (INDEPENDENT_AMBULATORY_CARE_PROVIDER_SITE_OTHER): Payer: Medicaid Other

## 2023-11-02 ENCOUNTER — Ambulatory Visit: Payer: Medicaid Other | Admitting: Internal Medicine

## 2023-11-02 VITALS — BP 110/70 | HR 82 | Ht 69.0 in | Wt 234.0 lb

## 2023-11-02 DIAGNOSIS — R7989 Other specified abnormal findings of blood chemistry: Secondary | ICD-10-CM

## 2023-11-02 DIAGNOSIS — Z860100 Personal history of colon polyps, unspecified: Secondary | ICD-10-CM

## 2023-11-02 LAB — HEPATIC FUNCTION PANEL
ALT: 14 U/L (ref 0–35)
AST: 13 U/L (ref 0–37)
Albumin: 4.5 g/dL (ref 3.5–5.2)
Alkaline Phosphatase: 77 U/L (ref 39–117)
Bilirubin, Direct: 0 mg/dL (ref 0.0–0.3)
Total Bilirubin: 0.3 mg/dL (ref 0.2–1.2)
Total Protein: 6.9 g/dL (ref 6.0–8.3)

## 2023-11-02 NOTE — Patient Instructions (Signed)
Your provider has requested that you go to the basement level for lab work before leaving today. Press "B" on the elevator. The lab is located at the first door on the left as you exit the elevator.  _______________________________________________________  If your blood pressure at your visit was 140/90 or greater, please contact your primary care physician to follow up on this.  _______________________________________________________  If you are age 46 or older, your body mass index should be between 23-30. Your Body mass index is 34.56 kg/m. If this is out of the aforementioned range listed, please consider follow up with your Primary Care Provider.  If you are age 47 or younger, your body mass index should be between 19-25. Your Body mass index is 34.56 kg/m. If this is out of the aformentioned range listed, please consider follow up with your Primary Care Provider.   ________________________________________________________  The [facility] providers would like to encourage you to use [facility] to communicate with providers for non-urgent requests or questions.  Due to long hold times on the telephone, sending your provider a message by [facility] may be a faster and more efficient way to get a response.  Please allow 48 business hours for a response.  Please remember that this is for non-urgent requests.  _______________________________________________________  Due to recent changes in healthcare laws, you may see the results of your imaging and laboratory studies on MyChart before your provider has had a chance to review them.  We understand that in some cases there may be results that are confusing or concerning to you. Not all laboratory results come back in the same time frame and the provider may be waiting for multiple results in order to interpret others.  Please give [place] 48 hours in order for your provider to thoroughly review all the results before contacting the office for clarification of  your results.   Thank you for entrusting me with your care and for choosing [facility], Dr. Eulah Pont

## 2023-11-02 NOTE — Progress Notes (Signed)
Chief Complaint: Elevated LFTs  HPI : 48 year old female with history of ADHD, bipolar disorder, glaucoma, and anxiety presents for follow up of elevated LFTs.  Interval History: She is doing well. No symptoms. No other changes in her medications recently. Denies jaundice, confusion, abdominal swelling, or blood in the stools. Denies abdominal pain or diarrhea.  She was using some ibuprofen prior to her last colonoscopy  Wt Readings from Last 3 Encounters:  11/02/23 234 lb (106.1 kg)  10/19/23 226 lb (102.5 kg)  09/13/23 229 lb (103.9 kg)   Past Medical History:  Diagnosis Date   ADHD (attention deficit hyperactivity disorder)    Anxiety    Arthritis    Bipolar 1 disorder (HCC)    Brain injury (HCC) 2005   Glaucoma    Hyperlipidemia    Hypertension    no longer treated     Past Surgical History:  Procedure Laterality Date   BRAIN SURGERY  10/28/2003   from [facility]   KNEE ARTHROSCOPY WITH MENISCAL REPAIR Right 05/19/2022   Procedure: RIGHT KNEE ARTHROSCOPY, MEDIAL MENISCAL DEBRIDEMENT AND ABRASION ARTHROPLASTY;  Surgeon: Huel Cote, MD;  Location: [facility] OR;  Service: Orthopedics;  Laterality: Right;   TIBIA OSTEOTOMY Right 05/19/2022   Procedure: RIGHT KNEE HIGH TIBIAL OSTEOTOMY;  Surgeon: Huel Cote, MD;  Location: [facility] OR;  Service: Orthopedics;  Laterality: Right;   WISDOM TOOTH EXTRACTION     Family History  Problem Relation Age of Onset   Bipolar disorder Mother    Hypertension Mother    Miscarriages / [place] Mother    Vision loss Mother    Varicose Veins Mother    High Cholesterol Mother    Anxiety disorder Mother    Obesity Mother    Alcohol abuse Father    Heart disease Father    Learning disabilities Father    High blood pressure Father    High Cholesterol Father    Sudden death Father    Obesity Father    Bipolar disorder Sister    Learning disabilities Sister    Diabetes Paternal Aunt    Bipolar disorder Maternal Grandmother    Hypertension  Maternal Grandfather    Cancer Paternal Grandmother    Cancer Paternal Grandfather    Colon cancer Neg Hx    Colon polyps Neg Hx    Esophageal cancer Neg Hx    Rectal cancer Neg Hx    Stomach cancer Neg Hx    Social History   Tobacco Use   Smoking status: Former    Current packs/day: 0.00    Types: Cigarettes    Start date: 06/13/2001    Quit date: 06/13/2021    Years since quitting: 2.3   Smokeless tobacco: Never  Vaping Use   Vaping status: Every Day   Substances: Flavoring  Substance Use Topics   Alcohol use: No   Drug use: No   Current Outpatient Medications  Medication Sig Dispense Refill   citalopram (CELEXA) 20 MG tablet Take 1 tablet (20 mg total) by mouth daily. 30 tablet 2   diclofenac Sodium (VOLTAREN) 1 % GEL Apply 4 g topically 4 (four) times daily. 50 g 3   ergocalciferol (VITAMIN D2) 1.25 MG (50000 UT) capsule Take 1 capsule (50,000 Units total) by mouth once a week. 12 capsule 0   ibuprofen (ADVIL) 800 MG tablet Take 1 tablet (800 mg total) by mouth every 8 (eight) hours as needed. 60 tablet 1   lamoTRIgine (LAMICTAL) 200 MG tablet Take 1  tablet (200 mg total) by mouth daily. 30 tablet 2   latanoprost (XALATAN) 0.005 % ophthalmic solution Place 1 drop into both eyes at bedtime. 2.5 mL 6   metFORMIN (GLUCOPHAGE-XR) 500 MG 24 hr tablet Take 1 tablet (500 mg total) by mouth 2 (two) times daily with a meal. 180 tablet 0   methocarbamol (ROBAXIN) 500 MG tablet Take 1 tablet (500 mg total) by mouth 4 (four) times daily. 60 tablet 1   ondansetron (ZOFRAN-ODT) 4 MG disintegrating tablet Take 1 tablet (4 mg total) by mouth every 8 (eight) hours as needed for nausea or vomiting. 20 tablet 0   Semaglutide-Weight Management 1 MG/0.5ML SOAJ Inject 1 mg into the skin once a week for 28 days. 2 mL 0   temazepam (RESTORIL) 15 MG capsule Take 1 capsule (15 mg total) by mouth at bedtime as needed for sleep. 30 capsule 2   No current facility-administered medications for this visit.    No Known Allergies  Physical Exam: BP 110/70   Pulse 82   Ht 5\' 9"  (1.753 m)   Wt 234 lb (106.1 kg)   BMI 34.56 kg/m  Constitutional: Pleasant,well-developed, female in no acute distress. HEENT: Normocephalic and atraumatic. Conjunctivae are normal. No scleral icterus. Cardiovascular: Normal rate, regular rhythm.  Pulmonary/chest: Effort normal and breath sounds normal. No wheezing, rales or rhonchi. Abdominal: Soft, nondistended, nontender. Bowel sounds active throughout. There are no masses palpable. No hepatomegaly. Extremities: No edema Neurological: Alert and oriented to person place and time. Skin: Skin is warm and dry. No rashes noted. Psychiatric: Normal mood and affect. Behavior is normal.  Labs 01/2023: TSH and free T4 normal.  CBC normal with platelets of 266.  CMP with elevated alk phos of 131, AST of 231, ALT of 440.  Labs 02/2023: CMP with mildly elevated ALT of 42.  INR normal at 1  Labs 07/2023: LFTs normal.  ASMA normal.  ANA normal.  AMA normal.  Alpha 1 antitrypsin normal.  Ceruloplasmin normal.  Hepatitis A antibody nonreactive.  INR normal.  IgG normal.  Ab U/S 01/29/23: IMPRESSION: 1. Diffuse increased echotexture of the liver. This is a nonspecific finding but can be seen in fatty infiltration of liver. 2. No other abnormality identified.  Colonoscopy 03/03/23: - Mild mucosal changes were found in the ileum. Biopsied. - One 3 mm polyp in the ascending colon, removed with a cold snare. Resected and retrieved. - Diverticulosis in the sigmoid colon and in the descending colon. - Non- bleeding internal hemorrhoids. Path: 1. Surgical [P], small bowel, ileum - PATCHY MILD ACTIVE CHRONIC ILEITIS (SEE NOTE) 2. Surgical [P], colon, ascending, polyp (1) - DIMINUTIVE TUBULAR ADENOMA. - NO HIGH GRADE DYSPLASIA OR MALIGNANCY. Diagnosis Note 1. - Morphologic evaluation reveals patchy mild active chronic ileitis with villous blunting and rare clusters of epithelioid  histiocytes, and basal lymphoplasmacytosis. While the observed findings could be compatible with inflammatory bowel disease (Crohns disease), other etiologies, including medication-effect (particularly NSAIDs) and an infection cannot be excluded histologically. Clinical and endoscopic correlation is recommended.  ASSESSMENT AND PLAN: Elevated LFTs History of ileitis History of colon polyps Patient presents for follow up of elevated LFTs that started in 10/2022.  Elevations have ranged from the mild to moderate range and have primarily consisted of AST, ALT, and alkaline phosphatase elevations.  Her recent abdominal ultrasound in 01/2023 shows some concern for potential fatty liver.  Patient's lab workup has not revealed any other etiology for her elevated LFTs.  Thus I would presume that  her elevated LFTs were primarily related to either fatty liver or her prior psychiatric medications that were stopped (including trazodone).  Patient's last set of LFTs in 07/2023 were completely normal, suggesting resolution of the inflammation in her liver.  If patient's LFTs are normal again today, then I would presume that her elevated LFTs were most likely due to her prior medications and that she can continue to follow-up with her PCP for annual liver lab checks.  On the patient's most recent colonoscopy in 02/2023, patient was noted to have mild active chronic ileitis that may be related to NSAID use.  Patient does endorse NSAID use prior to that colonoscopy.  She is asymptomatic from an IBD standpoint and would not be interested in starting any therapies for her ileitis. - Check LFTs - Patient got hepatitis A vaccine with PCP - Next colonoscopy due in 02/2030 for polyp surveillance and to follow up history of ileitis  Eulah Pont, MD  I spent 30 minutes of time, including in depth chart review, independent review of results as outlined above, communicating results with the patient directly, face-to-face time  with the patient, coordinating care, and ordering studies and medications as appropriate, and documentation.

## 2023-11-04 LAB — CERULOPLASMIN: Ceruloplasmin: 22 mg/dL (ref 14–48)

## 2023-11-16 ENCOUNTER — Encounter (INDEPENDENT_AMBULATORY_CARE_PROVIDER_SITE_OTHER): Payer: Self-pay | Admitting: Internal Medicine

## 2023-11-16 ENCOUNTER — Ambulatory Visit (INDEPENDENT_AMBULATORY_CARE_PROVIDER_SITE_OTHER): Payer: Medicaid Other | Admitting: Internal Medicine

## 2023-11-16 VITALS — BP 116/74 | HR 79 | Temp 97.7°F | Ht 69.0 in | Wt 221.0 lb

## 2023-11-16 DIAGNOSIS — K760 Fatty (change of) liver, not elsewhere classified: Secondary | ICD-10-CM

## 2023-11-16 DIAGNOSIS — E66812 Obesity, class 2: Secondary | ICD-10-CM

## 2023-11-16 DIAGNOSIS — Z6836 Body mass index (BMI) 36.0-36.9, adult: Secondary | ICD-10-CM | POA: Diagnosis not present

## 2023-11-16 DIAGNOSIS — E88819 Insulin resistance, unspecified: Secondary | ICD-10-CM

## 2023-11-16 DIAGNOSIS — E7800 Pure hypercholesterolemia, unspecified: Secondary | ICD-10-CM

## 2023-11-16 MED ORDER — SEMAGLUTIDE-WEIGHT MANAGEMENT 1.7 MG/0.75ML ~~LOC~~ SOAJ: 1.7000 mg | SUBCUTANEOUS | 0 refills | Status: AC

## 2023-11-16 NOTE — Assessment & Plan Note (Addendum)
Improved.  Most recent liver enzymes are within normal limits. She has lost 18% of total body weight.  She does not drink alcohol and has been maintaining a diet low in saturated fats and sugars.  She is also on GLP-1 which is of benefit.

## 2023-11-16 NOTE — Assessment & Plan Note (Signed)
Stable.  We will check fasting serologies at the next office visit.  She continues to maintain a diet with a low glycemic load.  She is currently on [facility] for pharmacoprophylaxis and will continue medication.

## 2023-11-16 NOTE — Assessment & Plan Note (Signed)
 Sarah Salas has lost 47 pounds or 18% of total body weight since starting her program in November 2023 on medically supervised weight management plan inclusive of GLP-1 therapy.  She is currently on Wegovy  1 mg once a week with adequate clinical response and no adverse effects.  We will increase medication to 1.7 mg once a week for maintenance.  She will continue with nutritional and behavioral strategies.

## 2023-11-16 NOTE — Assessment & Plan Note (Signed)
She maintains a diet low on saturated fats.  She will be due for fasting lipid profile at the next office visit.  We will reassess cardiovascular risk at that time and make further recommendations.  Continue with nutritional behavioral strategies

## 2023-11-16 NOTE — Progress Notes (Signed)
 Office: [phone]  /  Fax: [phone]  Weight Summary And Biometrics  Vitals Temp: 97.7 F (36.5 C) BP: 116/74 Pulse Rate: 79 SpO2: 100 %   Anthropometric Measurements Height: 5' 9 (1.753 m) Weight: 221 lb (100.2 kg) BMI (Calculated): 32.62 Weight at Last Visit: 226 lb Weight Lost Since Last Visit: 5 lb Weight Gained Since Last Visit: 0 lb Starting Weight: 268 lb Total Weight Loss (lbs): 46 lb (20.9 kg) Peak Weight: 450 lb   Body Composition  Body Fat %: 42.1 % Fat Mass (lbs): 93.2 lbs Muscle Mass (lbs): 121.6 lbs Total Body Water (lbs): 83.2 lbs Visceral Fat Rating : 10    No data recorded Today's Visit #: 17  Starting Date: 08/28/22   Subjective   Chief Complaint: Obesity  Sarah Salas is here to discuss her progress with her obesity treatment plan. She is on the the Category 2 Plan and states she is following her eating plan approximately 96 % of the time. She states she is exercising 60 minutes 5 times per week.  Weight Progress Since Last Visit:  Since last office visit she has lost 5 pounds. She reports good adherence to reduced calorie nutritional plan. She has been working on reading food labels, not skipping meals, increasing protein intake at every meal, drinking more water, making healthier choices, reducing portion sizes, and incorporating more whole foods   Challenges affecting patient progress: none.   Orexigenic Control: Denies problems with appetite and hunger signals.  Denies problems with satiety and satiation.  Denies problems with eating patterns and portion control.  Denies abnormal cravings. Denies feeling deprived or restricted.   Pharmacotherapy for weight management: She is currently taking Wegovy  with adequate clinical response  and without side effects..   Assessment and Plan   Treatment Plan For Obesity:  Recommended Dietary Goals  Sarah Salas is currently in the action stage of change. As such, her goal is to continue  weight management plan. She has agreed to: continue current plan  Behavioral Health and Counseling  We discussed the following behavioral modification strategies today: continue to work on maintaining a reduced calorie state, getting the recommended amount of protein, incorporating whole foods, making healthy choices, staying well hydrated and practicing mindfulness when eating..  Additional education and resources provided today: None  Recommended Physical Activity Goals  Sarah Salas has been advised to work up to 150 minutes of moderate intensity aerobic activity a week and strengthening exercises 2-3 times per week for cardiovascular health, weight loss maintenance and preservation of muscle mass.   She has agreed to :  Think about enjoyable ways to increase daily physical activity and overcoming barriers to exercise and Increase physical activity in their day and reduce sedentary time (increase NEAT).  Pharmacotherapy  We discussed various medication options to help Sarah Salas with her weight loss efforts and we both agreed to : increase Wegovy  to 1.7 mg once a week  Associated Conditions Impacted by Obesity Treatment  Metabolic dysfunction-associated steatotic liver disease (MASLD) Assessment & Plan: Improved.  Liver enzymes are no longer elevated.  She has lost 18% of total body weight.  She does not drink alcohol and has been maintaining a diet low in saturated fats and sugars.  She is also on GLP-1 which is of benefit.    Orders: -     Semaglutide -Weight Management; Inject 1.7 mg into the skin once a week for 28 days.  Dispense: 3 mL; Refill: 0  Class 2 severe obesity with serious comorbidity and body  mass index (BMI) of 36.0 to 36.9 in adult, unspecified obesity type Sarah Salas) Assessment & Plan: Sarah Salas has lost 47 pounds or 18% of total body weight since starting her program in November 2023 on medically supervised weight management plan inclusive of GLP-1 therapy.  She is currently on  Wegovy  1 mg once a week with adequate clinical response and no adverse effects.  We will increase medication to 1.7 mg once a week for maintenance.  She will continue with nutritional and behavioral strategies.  Orders: -     Semaglutide -Weight Management; Inject 1.7 mg into the skin once a week for 28 days.  Dispense: 3 mL; Refill: 0  Insulin  resistance Assessment & Plan: Stable.  We will check fasting serologies at the next office visit.  She continues to maintain a diet with a low glycemic load.  She is currently on Wegovy  for pharmacoprophylaxis and will continue medication.  Orders: -     Semaglutide -Weight Management; Inject 1.7 mg into the skin once a week for 28 days.  Dispense: 3 mL; Refill: 0     Objective   Physical Exam:  Blood pressure 116/74, pulse 79, temperature 97.7 F (36.5 C), height 5' 9 (1.753 m), weight 221 lb (100.2 kg), SpO2 100%. Body mass index is 32.64 kg/m.  General: She is overweight, cooperative, alert, well developed, and in no acute distress. PSYCH: Has normal mood, affect and thought process.   HEENT: EOMI, sclerae are anicteric. Lungs: Normal breathing effort, no conversational dyspnea. Extremities: No edema.  Neurologic: No gross sensory or motor deficits. No tremors or fasciculations noted.    Diagnostic Data Reviewed:  BMET    Component Value Date/Time   NA 144 07/19/2023 1155   NA 143 02/22/2023 0744   K 4.6 07/19/2023 1155   CL 105 07/19/2023 1155   CO2 30 07/19/2023 1155   GLUCOSE 93 07/19/2023 1155   BUN 12 07/19/2023 1155   BUN 16 02/22/2023 0744   CREATININE 0.73 07/19/2023 1155   CALCIUM 10.0 07/19/2023 1155   GFRNONAA >60 05/20/2022 0155   GFRAA 101 11/25/2020 0919   Lab Results  Component Value Date   HGBA1C 5.6 11/04/2022   HGBA1C 5.3 12/26/2019   Lab Results  Component Value Date   INSULIN  13.6 11/04/2022   Lab Results  Component Value Date   TSH 1.250 01/13/2023   CBC    Component Value Date/Time   WBC  5.6 01/13/2023 1344   WBC 6.7 05/12/2022 0959   RBC 4.57 01/13/2023 1344   RBC 4.85 05/12/2022 0959   HGB 13.4 01/13/2023 1344   HCT 40.1 01/13/2023 1344   PLT 266 01/13/2023 1344   MCV 88 01/13/2023 1344   MCH 29.3 01/13/2023 1344   MCH 29.5 05/12/2022 0959   MCHC 33.4 01/13/2023 1344   MCHC 32.9 05/12/2022 0959   RDW 13.0 01/13/2023 1344   Iron Studies    Component Value Date/Time   IRON 68 11/18/2022 1117   TIBC 329 11/18/2022 1117   FERRITIN 62 11/18/2022 1117   IRONPCTSAT 21 11/18/2022 1117   Lipid Panel     Component Value Date/Time   CHOL 201 (H) 01/13/2023 1344   TRIG 138 01/13/2023 1344   HDL 47 01/13/2023 1344   CHOLHDL 4.3 01/13/2023 1344   LDLCALC 129 (H) 01/13/2023 1344   Hepatic Function Panel     Component Value Date/Time   PROT 6.9 11/02/2023 1024   PROT 6.8 02/22/2023 0744   ALBUMIN 4.5 11/02/2023 1024   ALBUMIN  4.8 02/22/2023 0744   AST 13 11/02/2023 1024   ALT 14 11/02/2023 1024   ALKPHOS 77 11/02/2023 1024   BILITOT 0.3 11/02/2023 1024   BILITOT 0.5 02/22/2023 0744   BILIDIR 0.0 11/02/2023 1024      Component Value Date/Time   TSH 1.250 01/13/2023 1344   Nutritional Lab Results  Component Value Date   VD25OH 49.8 04/19/2023   VD25OH 21.8 (L) 11/04/2022    Follow-Up   Return in about 4 weeks (around 12/14/2023) for For Weight Mangement with Dr. Francyne.SABRA She was informed of the importance of frequent follow up visits to maximize her success with intensive lifestyle modifications for her multiple health conditions.  Attestation Statement   Reviewed by clinician on day of visit: allergies, medications, problem list, medical history, surgical history, family history, social history, and previous encounter notes.     Lucas Francyne, MD

## 2023-11-18 ENCOUNTER — Ambulatory Visit (INDEPENDENT_AMBULATORY_CARE_PROVIDER_SITE_OTHER): Payer: Medicaid Other | Admitting: Internal Medicine

## 2023-12-07 ENCOUNTER — Other Ambulatory Visit: Payer: Self-pay | Admitting: Nurse Practitioner

## 2023-12-07 DIAGNOSIS — Z1231 Encounter for screening mammogram for malignant neoplasm of breast: Secondary | ICD-10-CM

## 2023-12-16 ENCOUNTER — Encounter (INDEPENDENT_AMBULATORY_CARE_PROVIDER_SITE_OTHER): Payer: Self-pay | Admitting: Internal Medicine

## 2023-12-16 ENCOUNTER — Ambulatory Visit (INDEPENDENT_AMBULATORY_CARE_PROVIDER_SITE_OTHER): Payer: Medicaid Other | Admitting: Internal Medicine

## 2023-12-16 VITALS — BP 102/65 | HR 74 | Temp 97.9°F | Ht 69.0 in | Wt 220.0 lb

## 2023-12-16 DIAGNOSIS — K760 Fatty (change of) liver, not elsewhere classified: Secondary | ICD-10-CM

## 2023-12-16 DIAGNOSIS — E66812 Obesity, class 2: Secondary | ICD-10-CM

## 2023-12-16 DIAGNOSIS — R638 Other symptoms and signs concerning food and fluid intake: Secondary | ICD-10-CM

## 2023-12-16 DIAGNOSIS — E88819 Insulin resistance, unspecified: Secondary | ICD-10-CM

## 2023-12-16 DIAGNOSIS — Z6836 Body mass index (BMI) 36.0-36.9, adult: Secondary | ICD-10-CM | POA: Diagnosis not present

## 2023-12-16 MED ORDER — WEGOVY 2.4 MG/0.75ML ~~LOC~~ SOAJ: 2.4000 mg | SUBCUTANEOUS | 0 refills | Status: DC

## 2023-12-16 NOTE — Assessment & Plan Note (Signed)
 Stable.  We will check fasting serologies at the next office visit.  She continues to maintain a diet with a low glycemic load.  She is currently on [facility] for pharmacoprophylaxis and will continue medication.

## 2023-12-16 NOTE — Assessment & Plan Note (Signed)
 Improved.  Most recent liver enzymes are within normal limits. She has lost 19 % of total body weight.  She does not drink alcohol and has been maintaining a diet low in saturated fats and sugars.  She is also on GLP-1 which is of benefit.

## 2023-12-16 NOTE — Progress Notes (Deleted)
 Office: [phone]  /  Fax: [phone]  Weight Summary And Biometrics  Vitals Temp: 97.9 F (36.6 C) BP: 102/65 Pulse Rate: 74 SpO2: 100 %   Anthropometric Measurements Height: 5\' 9"  (1.753 m) Weight: 220 lb (99.8 kg) BMI (Calculated): 32.47 Weight at Last Visit: 221 lb Weight Lost Since Last Visit: 1 Weight Gained Since Last Visit: 0 Starting Weight: 268 lb Total Weight Loss (lbs): 48 lb (21.8 kg) Peak Weight: 450 lb   Body Composition  Body Fat %: 42.7 % Fat Mass (lbs): 94.2 lbs Muscle Mass (lbs): 119.8 lbs Total Body Water (lbs): 85.2 lbs Visceral Fat Rating : 10    No data recorded Today's Visit #: 18  Starting Date: 08/28/22   Subjective   Chief Complaint: Obesity  Sarah Salas is here to discuss her progress with her obesity treatment plan. She is on the the Category 2 Plan and states she is following her eating plan approximately 98 % of the time. She states she is exercising 2.5 hours 5 times per week.  Weight Progress Since Last Visit:  Since last office visit she has {emweight change:30888}. She reports {EMADHERENCE:28838::"good adherence to reduced calorie nutritional plan."} She has been working on [employer] labels","not skipping meals","increasing protein intake at every meal","drinking more water","making healthier choices","reducing portion sizes","incorporating more whole foods"}   Challenges affecting patient progress: {EMOBESITYBARRIERS:28841}.   Orexigenic Control: {Actions; denies-reports:32002} problems with appetite and hunger signals.  {Actions; denies-reports:32002} problems with satiety and satiation.  {Actions; denies-reports:32002} problems with eating patterns and portion control.  {Actions; denies-reports:32002} abnormal cravings. {Actions; denies-reports:32002} feeling deprived or restricted.   Pharmacotherapy for weight management: She is currently taking {EMPharmaco:28845}.   Assessment and Plan    Treatment Plan For Obesity:  Recommended Dietary Goals  Sarah Salas is currently in the action stage of change. As such, her goal is to continue weight management plan. She has agreed to: {EMWTLOSSPLAN:29297::"continue current plan"}  Behavioral Health and Counseling  We discussed the following behavioral modification strategies today: {EMWMwtlossstrategies:28914::"continue to work on maintaining a reduced calorie state, getting the recommended amount of protein, incorporating whole foods, making healthy choices, staying well hydrated and practicing mindfulness when eating."}.  Additional education and resources provided today: {EMadditionalresources:29169::"None"}  Recommended Physical Activity Goals  Sarah Salas has been advised to work up to 150 minutes of moderate intensity aerobic activity a week and strengthening exercises 2-3 times per week for cardiovascular health, weight loss maintenance and preservation of muscle mass.   She has agreed to :  {EMEXERCISE:28847::"Think about enjoyable ways to increase daily physical activity and overcoming barriers to exercise","Increase physical activity in their day and reduce sedentary time (increase NEAT)."}  Pharmacotherapy  We discussed various medication options to help Sarah Salas with her weight loss efforts and we both agreed to : {EMagreedrx:29170}  Associated Conditions Impacted by Obesity Treatment    Objective   Physical Exam:  Blood pressure 102/65, pulse 74, temperature 97.9 F (36.6 C), height 5\' 9"  (1.753 m), weight 220 lb (99.8 kg), SpO2 100%. Body mass index is 32.49 kg/m.  General: She is overweight, cooperative, alert, well developed, and in no acute distress. PSYCH: Has normal mood, affect and thought process.   HEENT: EOMI, sclerae are anicteric. Lungs: Normal breathing effort, no conversational dyspnea. Extremities: No edema.  Neurologic: No gross sensory or motor deficits. No tremors or fasciculations noted.     Diagnostic Data Reviewed:  BMET    Component Value Date/Time   NA 144 07/19/2023 1155   NA 143 02/22/2023 0744  K 4.6 07/19/2023 1155   CL 105 07/19/2023 1155   CO2 30 07/19/2023 1155   GLUCOSE 93 07/19/2023 1155   BUN 12 07/19/2023 1155   BUN 16 02/22/2023 0744   CREATININE 0.73 07/19/2023 1155   CALCIUM 10.0 07/19/2023 1155   GFRNONAA >60 05/20/2022 0155   GFRAA 101 11/25/2020 0919   Lab Results  Component Value Date   HGBA1C 5.6 11/04/2022   HGBA1C 5.3 12/26/2019   Lab Results  Component Value Date   INSULIN 13.6 11/04/2022   Lab Results  Component Value Date   TSH 1.250 01/13/2023   CBC    Component Value Date/Time   WBC 5.6 01/13/2023 1344   WBC 6.7 05/12/2022 0959   RBC 4.57 01/13/2023 1344   RBC 4.85 05/12/2022 0959   HGB 13.4 01/13/2023 1344   HCT 40.1 01/13/2023 1344   PLT 266 01/13/2023 1344   MCV 88 01/13/2023 1344   MCH 29.3 01/13/2023 1344   MCH 29.5 05/12/2022 0959   MCHC 33.4 01/13/2023 1344   MCHC 32.9 05/12/2022 0959   RDW 13.0 01/13/2023 1344   Iron Studies    Component Value Date/Time   IRON 68 11/18/2022 1117   TIBC 329 11/18/2022 1117   FERRITIN 62 11/18/2022 1117   IRONPCTSAT 21 11/18/2022 1117   Lipid Panel     Component Value Date/Time   CHOL 201 (H) 01/13/2023 1344   TRIG 138 01/13/2023 1344   HDL 47 01/13/2023 1344   CHOLHDL 4.3 01/13/2023 1344   LDLCALC 129 (H) 01/13/2023 1344   Hepatic Function Panel     Component Value Date/Time   PROT 6.9 11/02/2023 1024   PROT 6.8 02/22/2023 0744   ALBUMIN 4.5 11/02/2023 1024   ALBUMIN 4.8 02/22/2023 0744   AST 13 11/02/2023 1024   ALT 14 11/02/2023 1024   ALKPHOS 77 11/02/2023 1024   BILITOT 0.3 11/02/2023 1024   BILITOT 0.5 02/22/2023 0744   BILIDIR 0.0 11/02/2023 1024      Component Value Date/Time   TSH 1.250 01/13/2023 1344   Nutritional Lab Results  Component Value Date   VD25OH 49.8 04/19/2023   VD25OH 21.8 (L) 11/04/2022    Follow-Up   No follow-ups  on file.Marland Kitchen She was informed of the importance of frequent follow up visits to maximize her success with intensive lifestyle modifications for her multiple health conditions.  Attestation Statement   Reviewed by clinician on day of visit: allergies, medications, problem list, medical history, surgical history, family history, social history, and previous encounter notes.     Worthy Rancher, MD

## 2023-12-16 NOTE — Assessment & Plan Note (Signed)
 Improved on incretin therapy. She has increased orexigenic signaling, impaired satiety and inhibitory control. This is secondary to an abnormal energy regulation system and pathological neurohormonal pathways characteristic of excess adiposity.  In addition to nutritional and behavioral strategies she benefits from ongoing pharmacotherapy.  We will increase Wegovy to 2.4 mg once a week

## 2023-12-16 NOTE — Assessment & Plan Note (Signed)
 She is undergoing medical weight management with a one-pound weight loss since the last visit. Adherence to the category two plan is at 98%, including calorie tracking, protein intake, hydration, and exercising five days a week. Slow weight loss is attributed to metabolic adaptations. She is on 1.7 mg of Wegovy with no side effects. Emphasis was placed on protein intake to boost metabolic rate, and intermittent fasting was suggested to enhance weight loss. Increasing Wegovy to 2.4 mg was discussed and agreed upon due to good tolerance of the current dose. - Increase Wegovy to 2.4 mg - Track protein intake, aim for 30-40 grams per meal - Consider incorporating intermittent fasting with a 16:8 fasting schedule - Follow up in four weeks

## 2023-12-16 NOTE — Progress Notes (Signed)
 Office: [phone]  /  Fax: [phone]  Weight Summary And Biometrics  Vitals Temp: 97.9 F (36.6 C) BP: 102/65 Pulse Rate: 74 SpO2: 100 %   Anthropometric Measurements Height: 5\' 9"  (1.753 m) Weight: 220 lb (99.8 kg) BMI (Calculated): 32.47 Weight at Last Visit: 221 lb Weight Lost Since Last Visit: 1 Weight Gained Since Last Visit: 0 Starting Weight: 268 lb Total Weight Loss (lbs): 48 lb (21.8 kg) Peak Weight: 450 lb   Body Composition  Body Fat %: 42.7 % Fat Mass (lbs): 94.2 lbs Muscle Mass (lbs): 119.8 lbs Total Body Water (lbs): 85.2 lbs Visceral Fat Rating : 10    No data recorded Today's Visit #: 18  Starting Date: 08/28/22   Subjective   Chief Complaint: Obesity  Interval History Discussed the use of AI scribe software for clinical note transcription with the patient, who gave verbal consent to proceed.  History of Present Illness   Sarah Salas is a 48 year old female who presents for medical weight management.  Over the past 2 months, she has lost a total of two to three pounds, which she finds frustrating given her adherence to the category two plan 98% of the time. She tracks her calories, consumes the recommended amount of protein, maintains adequate hydration, avoids processed foods, and does not skip meals.  She exercises five days a week, engaging in both strengthening and cardio activities for two and a half minutes each session. Despite her consistent exercise routine, she is discouraged by the slow progress in her weight loss.  She is currently on 1.7 mg of Wegovy with no side effects such as nausea, vomiting, or constipation. The medication effectively controls her appetite by signaling her brain when she is full, preventing overeating.  She acknowledges a decrease in her effort to measure protein intake compared to previous times, which she recognizes might be affecting her metabolic rate. She has not been tracking her protein intake  as diligently as before, which she suspects might be contributing to the slowing of her weight loss.  She reports adequate sleep, although her new cat occasionally disrupts her sleep in the middle of the night.        Challenges affecting patient progress:  Mental health .    Pharmacotherapy for weight management: She is currently taking Wegovy with adequate clinical response  and without side effects..   Assessment and Plan   Treatment Plan For Obesity:  Recommended Dietary Goals  Sarah Salas is currently in the action stage of change. As such, her goal is to continue weight management plan. She has agreed to: continue current plan, start tracking protein for goal of 90 to 120 g/day or 30 to 40 g per meal.  She will also incorporate intermittent fasting 16:8  Behavioral Health and Counseling  We discussed the following behavioral modification strategies today: increasing lean protein intake to established goals and work on tracking and journaling calories using tracking application.  Patient counseled also on avoiding negative self talk and was educated on expected course and weight management  Additional education and resources provided today: None  Recommended Physical Activity Goals  Sarah Salas has been advised to work up to 150 minutes of moderate intensity aerobic activity a week and strengthening exercises 2-3 times per week for cardiovascular health, weight loss maintenance and preservation of muscle mass.   She has agreed to :  Continue current level of physical activity   Pharmacotherapy  We discussed various medication options to help Sarah Salas with  her weight loss efforts and we both agreed to : increase Wegovy to 2.4 mg once a week  Associated Conditions Impacted by Obesity Treatment  Abnormal food appetite Assessment & Plan: Improved on incretin therapy. She has increased orexigenic signaling, impaired satiety and inhibitory control. This is secondary to an abnormal energy  regulation system and pathological neurohormonal pathways characteristic of excess adiposity.  In addition to nutritional and behavioral strategies she benefits from ongoing pharmacotherapy.  We will increase Wegovy to 2.4 mg once a week    Metabolic dysfunction-associated steatotic liver disease (MASLD) Assessment & Plan: Improved.  Most recent liver enzymes are within normal limits. She has lost 19 % of total body weight.  She does not drink alcohol and has been maintaining a diet low in saturated fats and sugars.  She is also on GLP-1 which is of benefit.    Orders: -     Wegovy; Inject 2.4 mg into the skin once a week.  Dispense: 3 mL; Refill: 0  Class 2 severe obesity with serious comorbidity and body mass index (BMI) of 36.0 to 36.9 in adult, unspecified obesity type [facility]) Assessment & Plan: She is undergoing medical weight management with a one-pound weight loss since the last visit. Adherence to the category two plan is at 98%, including calorie tracking, protein intake, hydration, and exercising five days a week. Slow weight loss is attributed to metabolic adaptations. She is on 1.7 mg of Wegovy with no side effects. Emphasis was placed on protein intake to boost metabolic rate, and intermittent fasting was suggested to enhance weight loss. Increasing Wegovy to 2.4 mg was discussed and agreed upon due to good tolerance of the current dose. - Increase Wegovy to 2.4 mg - Track protein intake, aim for 30-40 grams per meal - Consider incorporating intermittent fasting with a 16:8 fasting schedule - Follow up in four weeks   Orders: -     QMVHQI; Inject 2.4 mg into the skin once a week.  Dispense: 3 mL; Refill: 0  Insulin resistance Assessment & Plan: Stable.  We will check fasting serologies at the next office visit.  She continues to maintain a diet with a low glycemic load.  She is currently on [facility] for pharmacoprophylaxis and will continue medication.  Orders: -     Wegovy;  Inject 2.4 mg into the skin once a week.  Dispense: 3 mL; Refill: 0             Objective   Physical Exam:  Blood pressure 102/65, pulse 74, temperature 97.9 F (36.6 C), height 5\' 9"  (1.753 m), weight 220 lb (99.8 kg), SpO2 100%. Body mass index is 32.49 kg/m.  General: She is overweight, cooperative, alert, well developed, and in no acute distress. PSYCH: Has normal mood, affect and thought process.   HEENT: EOMI, sclerae are anicteric. Lungs: Normal breathing effort, no conversational dyspnea. Extremities: No edema.  Neurologic: No gross sensory or motor deficits. No tremors or fasciculations noted.    Diagnostic Data Reviewed:  BMET    Component Value Date/Time   NA 144 07/19/2023 1155   NA 143 02/22/2023 0744   K 4.6 07/19/2023 1155   CL 105 07/19/2023 1155   CO2 30 07/19/2023 1155   GLUCOSE 93 07/19/2023 1155   BUN 12 07/19/2023 1155   BUN 16 02/22/2023 0744   CREATININE 0.73 07/19/2023 1155   CALCIUM 10.0 07/19/2023 1155   GFRNONAA >60 05/20/2022 0155   GFRAA 101 11/25/2020 0919   Lab Results  Component Value Date   HGBA1C 5.6 11/04/2022   HGBA1C 5.3 12/26/2019   Lab Results  Component Value Date   INSULIN 13.6 11/04/2022   Lab Results  Component Value Date   TSH 1.250 01/13/2023   CBC    Component Value Date/Time   WBC 5.6 01/13/2023 1344   WBC 6.7 05/12/2022 0959   RBC 4.57 01/13/2023 1344   RBC 4.85 05/12/2022 0959   HGB 13.4 01/13/2023 1344   HCT 40.1 01/13/2023 1344   PLT 266 01/13/2023 1344   MCV 88 01/13/2023 1344   MCH 29.3 01/13/2023 1344   MCH 29.5 05/12/2022 0959   MCHC 33.4 01/13/2023 1344   MCHC 32.9 05/12/2022 0959   RDW 13.0 01/13/2023 1344   Iron Studies    Component Value Date/Time   IRON 68 11/18/2022 1117   TIBC 329 11/18/2022 1117   FERRITIN 62 11/18/2022 1117   IRONPCTSAT 21 11/18/2022 1117   Lipid Panel     Component Value Date/Time   CHOL 201 (H) 01/13/2023 1344   TRIG 138 01/13/2023 1344   HDL 47  01/13/2023 1344   CHOLHDL 4.3 01/13/2023 1344   LDLCALC 129 (H) 01/13/2023 1344   Hepatic Function Panel     Component Value Date/Time   PROT 6.9 11/02/2023 1024   PROT 6.8 02/22/2023 0744   ALBUMIN 4.5 11/02/2023 1024   ALBUMIN 4.8 02/22/2023 0744   AST 13 11/02/2023 1024   ALT 14 11/02/2023 1024   ALKPHOS 77 11/02/2023 1024   BILITOT 0.3 11/02/2023 1024   BILITOT 0.5 02/22/2023 0744   BILIDIR 0.0 11/02/2023 1024      Component Value Date/Time   TSH 1.250 01/13/2023 1344   Nutritional Lab Results  Component Value Date   VD25OH 49.8 04/19/2023   VD25OH 21.8 (L) 11/04/2022    Medications: Outpatient Encounter Medications as of 12/16/2023  Medication Sig   citalopram (CELEXA) 20 MG tablet Take 1 tablet (20 mg total) by mouth daily.   diclofenac Sodium (VOLTAREN) 1 % GEL Apply 4 g topically 4 (four) times daily.   ergocalciferol (VITAMIN D2) 1.25 MG (50000 UT) capsule Take 1 capsule (50,000 Units total) by mouth once a week.   ibuprofen (ADVIL) 800 MG tablet Take 1 tablet (800 mg total) by mouth every 8 (eight) hours as needed.   lamoTRIgine (LAMICTAL) 200 MG tablet Take 1 tablet (200 mg total) by mouth daily.   latanoprost (XALATAN) 0.005 % ophthalmic solution Place 1 drop into both eyes at bedtime.   metFORMIN (GLUCOPHAGE-XR) 500 MG 24 hr tablet Take 1 tablet (500 mg total) by mouth 2 (two) times daily with a meal.   methocarbamol (ROBAXIN) 500 MG tablet Take 1 tablet (500 mg total) by mouth 4 (four) times daily.   ondansetron (ZOFRAN-ODT) 4 MG disintegrating tablet Take 1 tablet (4 mg total) by mouth every 8 (eight) hours as needed for nausea or vomiting.   Semaglutide-Weight Management (WEGOVY) 2.4 MG/0.75ML SOAJ Inject 2.4 mg into the skin once a week.   temazepam (RESTORIL) 15 MG capsule Take 1 capsule (15 mg total) by mouth at bedtime as needed for sleep.   No facility-administered encounter medications on file as of 12/16/2023.     Follow-Up   Return in about 4 weeks  (around 01/13/2024) for For Weight Mangement with Dr. Rikki Spearing.Marland Kitchen She was informed of the importance of frequent follow up visits to maximize her success with intensive lifestyle modifications for her multiple health conditions.  Attestation Statement   Reviewed by  clinician on day of visit: allergies, medications, problem list, medical history, surgical history, family history, social history, and previous encounter notes.     Worthy Rancher, MD

## 2023-12-21 ENCOUNTER — Encounter (HOSPITAL_COMMUNITY): Payer: Self-pay | Admitting: Psychiatry

## 2023-12-21 ENCOUNTER — Telehealth (HOSPITAL_BASED_OUTPATIENT_CLINIC_OR_DEPARTMENT_OTHER): Payer: Medicaid Other | Admitting: Psychiatry

## 2023-12-21 DIAGNOSIS — F419 Anxiety disorder, unspecified: Secondary | ICD-10-CM | POA: Diagnosis not present

## 2023-12-21 DIAGNOSIS — F319 Bipolar disorder, unspecified: Secondary | ICD-10-CM | POA: Diagnosis not present

## 2023-12-21 DIAGNOSIS — F902 Attention-deficit hyperactivity disorder, combined type: Secondary | ICD-10-CM

## 2023-12-21 MED ORDER — CITALOPRAM HYDROBROMIDE 20 MG PO TABS: 20.0000 mg | ORAL_TABLET | Freq: Every day | ORAL | 2 refills | Status: DC

## 2023-12-21 MED ORDER — TEMAZEPAM 15 MG PO CAPS: 15.0000 mg | ORAL_CAPSULE | Freq: Every evening | ORAL | 2 refills | Status: DC | PRN

## 2023-12-21 MED ORDER — LAMOTRIGINE 200 MG PO TABS: 200.0000 mg | ORAL_TABLET | Freq: Every day | ORAL | 2 refills | Status: DC

## 2023-12-21 NOTE — Progress Notes (Signed)
 [facility] MD/PA/NP OP Progress Note  Virtual Visit via Video Note  I connected with Sarah Salas on 12/21/23 at  8:20 AM EDT by a video enabled telemedicine application and verified that I am speaking with the correct person using two identifiers.  Location: Patient: Home Provider: Home Office   I discussed the limitations of evaluation and management by telemedicine and the availability of in person appointments. The patient expressed understanding and agreed to proceed.  12/21/2023 8:23 AM Sarah Salas  MRN:  [id number]  Chief Complaint:  Chief Complaint  Patient presents with   Follow-up   Medication Refill   HPI: Patient is evaluated by video session.  She is taking all her medication and reported her mood is stable.  She denies any mania, psychosis, hallucination.  She denies any major panic attack.  She sleeps at least 6 hours.  Recently she had a cat which usually wakes up 4:00 in the morning.  Patient enjoyed the company of her cat.  Her relationship with the boyfriend is also going very well.  Patient has a birthday coming in 2 days but she has no specific plan to celebrate.  She denies any hallucination, paranoia, suicidal thoughts.  She is trying to keep herself active and watching her calorie intake.  She is in a weight loss program and she had lost few pounds since the last visit.  She has no rash, itching, tremors or shakes.  She reported had a good relationship and handling the stress very well related to her sister, mother.  She admitted some disappointment as hoping to have more weight loss but she is happy with her current weight and diet plan.  She denies drinking or using any illegal substances.  Recently she had blood work and liver enzymes are normal which was high few months ago.  Since medicines were adjusted and some of the medicines work discontinued her liver enzymes are continue to get better.  Patient like to keep the current medication.  Visit Diagnosis:    ICD-10-CM    1. Anxiety  F41.9 citalopram (CELEXA) 20 MG tablet    lamoTRIgine (LAMICTAL) 200 MG tablet    temazepam (RESTORIL) 15 MG capsule    2. Attention deficit hyperactivity disorder (ADHD), combined type  F90.2 citalopram (CELEXA) 20 MG tablet    lamoTRIgine (LAMICTAL) 200 MG tablet    3. Bipolar I disorder (HCC)  F31.9 lamoTRIgine (LAMICTAL) 200 MG tablet    temazepam (RESTORIL) 15 MG capsule      Past Psychiatric History: Reviewed H/O ADHD, bipolar disorder. Vyvanse, Strattera, Hydroxyzine and Trazodone did not help.  Adderall helped.  Latuda helped in the beginning but then got worse with increased dose.  No h/o paranoia, inpatient treatment or suicidal attempt. H/O TBI.  Gabapentin and Abilify helped but caused increased weight gain, liver enzyme and blood sugar.    Past Medical History:  Past Medical History:  Diagnosis Date   ADHD (attention deficit hyperactivity disorder)    Anxiety    Arthritis    Bipolar 1 disorder (HCC)    Brain injury (HCC) 2005   Glaucoma    Hyperlipidemia    Hypertension    no longer treated    Past Surgical History:  Procedure Laterality Date   BRAIN SURGERY  10/28/2003   from [facility]   KNEE ARTHROSCOPY WITH MENISCAL REPAIR Right 05/19/2022   Procedure: RIGHT KNEE ARTHROSCOPY, MEDIAL MENISCAL DEBRIDEMENT AND ABRASION ARTHROPLASTY;  Surgeon: Huel Cote, MD;  Location: [facility] OR;  Service: Orthopedics;  Laterality: Right;   TIBIA OSTEOTOMY Right 05/19/2022   Procedure: RIGHT KNEE HIGH TIBIAL OSTEOTOMY;  Surgeon: Huel Cote, MD;  Location: [facility] OR;  Service: Orthopedics;  Laterality: Right;   WISDOM TOOTH EXTRACTION      Family Psychiatric History: Reviewed  Family History:  Family History  Problem Relation Age of Onset   Bipolar disorder Mother    Hypertension Mother    Miscarriages / [place] Mother    Vision loss Mother    Varicose Veins Mother    High Cholesterol Mother    Anxiety disorder Mother    Obesity Mother    Alcohol abuse Father     Heart disease Father    Learning disabilities Father    High blood pressure Father    High Cholesterol Father    Sudden death Father    Obesity Father    Bipolar disorder Sister    Learning disabilities Sister    Bipolar disorder Maternal Grandmother    Mental illness Maternal Grandmother    Hypertension Maternal Grandfather    Cancer Paternal Grandmother    Cancer Paternal Grandfather    Diabetes Paternal Aunt    Colon cancer Neg Hx    Colon polyps Neg Hx    Esophageal cancer Neg Hx    Rectal cancer Neg Hx    Stomach cancer Neg Hx     Social History:  Social History   Socioeconomic History   Marital status: Single    Spouse name: Not on file   Number of children: 0   Years of education: Not on file   Highest education level: Bachelor's degree (e.g., BA, AB, BS)  Occupational History   Occupation: takes care of parents  Tobacco Use   Smoking status: Former    Current packs/day: 0.00    Types: Cigarettes    Start date: 06/13/2001    Quit date: 06/13/2021    Years since quitting: 2.5   Smokeless tobacco: Never  Vaping Use   Vaping status: Every Day   Substances: Flavoring  Substance and Sexual Activity   Alcohol use: No   Drug use: No   Sexual activity: Not Currently    Birth control/protection: I.U.D.  Other Topics Concern   Not on file  Social History Narrative   Not on file   Social Drivers of Health   Financial Resource Strain: Medium Risk (01/21/2018)   Overall Financial Resource Strain (CARDIA)    Difficulty of Paying Living Expenses: Somewhat hard  Food Insecurity: No Food Insecurity (01/21/2018)   Hunger Vital Sign    Worried About Running Out of Food in the Last Year: Never true    Ran Out of Food in the Last Year: Never true  Transportation Needs: No Transportation Needs (01/21/2018)   PRAPARE - Administrator, Civil Service (Medical): No    Lack of Transportation (Non-Medical): No  Physical Activity: Inactive (01/21/2018)   Exercise  Vital Sign    Days of Exercise per Week: 0 days    Minutes of Exercise per Session: 0 min  Stress: Stress Concern Present (01/21/2018)   [employer] of Occupational Health - Occupational Stress Questionnaire    Feeling of Stress : To some extent  Social Connections: Moderately Isolated (01/21/2018)   Social Connection and Isolation Panel [NHANES]    Frequency of Communication with Friends and Family: More than three times a week    Frequency of Social Gatherings with Friends and Family: More than three times a week  Attends Religious Services: Never    Active Member of Clubs or Organizations: No    Attends Banker Meetings: Never    Marital Status: Never married    Allergies: No Known Allergies  Metabolic Disorder Labs: Lab Results  Component Value Date   HGBA1C 5.6 11/04/2022   No results found for: "PROLACTIN" Lab Results  Component Value Date   CHOL 201 (H) 01/13/2023   TRIG 138 01/13/2023   HDL 47 01/13/2023   CHOLHDL 4.3 01/13/2023   LDLCALC 129 (H) 01/13/2023   LDLCALC 161 (H) 11/04/2022   Lab Results  Component Value Date   TSH 1.250 01/13/2023   TSH 1.200 11/04/2022    Therapeutic Level Labs: No results found for: "LITHIUM" No results found for: "VALPROATE" No results found for: "CBMZ"  Current Medications: Current Outpatient Medications  Medication Sig Dispense Refill   citalopram (CELEXA) 20 MG tablet Take 1 tablet (20 mg total) by mouth daily. 30 tablet 2   diclofenac Sodium (VOLTAREN) 1 % GEL Apply 4 g topically 4 (four) times daily. 50 g 3   ergocalciferol (VITAMIN D2) 1.25 MG (50000 UT) capsule Take 1 capsule (50,000 Units total) by mouth once a week. 12 capsule 0   ibuprofen (ADVIL) 800 MG tablet Take 1 tablet (800 mg total) by mouth every 8 (eight) hours as needed. 60 tablet 1   lamoTRIgine (LAMICTAL) 200 MG tablet Take 1 tablet (200 mg total) by mouth daily. 30 tablet 2   latanoprost (XALATAN) 0.005 % ophthalmic solution Place 1  drop into both eyes at bedtime. 2.5 mL 6   metFORMIN (GLUCOPHAGE-XR) 500 MG 24 hr tablet Take 1 tablet (500 mg total) by mouth 2 (two) times daily with a meal. 180 tablet 0   methocarbamol (ROBAXIN) 500 MG tablet Take 1 tablet (500 mg total) by mouth 4 (four) times daily. 60 tablet 1   ondansetron (ZOFRAN-ODT) 4 MG disintegrating tablet Take 1 tablet (4 mg total) by mouth every 8 (eight) hours as needed for nausea or vomiting. 20 tablet 0   Semaglutide-Weight Management (WEGOVY) 2.4 MG/0.75ML SOAJ Inject 2.4 mg into the skin once a week. 3 mL 0   temazepam (RESTORIL) 15 MG capsule Take 1 capsule (15 mg total) by mouth at bedtime as needed for sleep. 30 capsule 2   No current facility-administered medications for this visit.     Musculoskeletal: Strength & Muscle Tone: within normal limits Gait & Station: normal Patient leans: N/A  Psychiatric Specialty Exam: Review of Systems  Weight 220 lb (99.8 kg).There is no height or weight on file to calculate BMI.  General Appearance: Casual  Eye Contact:  Good  Speech:   fast  Volume:  Normal  Mood:  Euphoric  Affect:  Congruent  Thought Process:  Descriptions of Associations: Circumstantial  Orientation:  Full (Time, Place, and Person)  Thought Content: Logical   Suicidal Thoughts:  No  Homicidal Thoughts:  No  Memory:  Immediate;   Good Recent;   Good Remote;   Good  Judgement:  Intact  Insight:  Present  Psychomotor Activity:  Increased  Concentration:  Concentration: Fair and Attention Span: Fair  Recall:  Good  Fund of Knowledge: Good  Language: Good  Akathisia:  No  Handed:  Right  AIMS (if indicated): not done  Assets:  Communication Skills Desire for Improvement Housing Resilience Social Support Transportation  ADL's:  Intact  Cognition: WNL  Sleep:   ok but cat comes at 4 am   Screenings:  GAD-7    Flowsheet Row Office Visit from 01/13/2023 in [facility] [place] - A Dept Of [place]. [facility] Office Visit from 11/22/2018 in [facility] [place] - A Dept Of Eligha Bridegroom. [facility] Office Visit from 08/16/2018 in [facility] [place] - A Dept Of [place]. [facility] Office Visit from 05/18/2018 in [facility] [place] - A Dept Of Eligha Bridegroom. [facility] Office Visit from 03/14/2018 in [facility] [place] - A Dept Of Eligha Bridegroom. [facility]  Total GAD-7 Score 0 0 0 0 0      PHQ2-9    Flowsheet Row Office Visit from 01/13/2023 in [facility] [place] - A Dept Of [place]. [facility] Office Visit from 11/22/2018 in [facility] [place] - A Dept Of Eligha Bridegroom. [facility] Office Visit from 08/16/2018 in [facility] [place] - A Dept Of [place]. [facility] Office Visit from 05/18/2018 in [facility] [place] - A Dept Of Eligha Bridegroom. [facility] Office Visit from 03/14/2018 in [facility] [place] - A Dept Of Eligha Bridegroom. [facility]  PHQ-2 Total Score 0 0 0 0 0  PHQ-9 Total Score 0 0 0 0 0      Flowsheet Row Admission (Discharged) from 05/19/2022 in [facility] Video Visit from 09[facility]15/2021 in [facility] Video Visit from 04/01/2021 in [facility]  C-SSRS RISK CATEGORY No Risk No Risk No Risk        Assessment and Plan: Patient is 48 year old female with history of elevated liver enzymes and high blood sugar currently in a weight loss program.  She has bipolar disorder, anxiety and ADHD.  Her current medicine is working well and there were enzymes back to normal.  I reviewed blood work results.  I reviewed notes from other provider.  No major changes in her medication.  I encourage in addition to diet plan consider going to gym, walking to help her weight loss.  Will continue current psychotropic medication  since it is keeping her mood stable.  She had some struggle with attention concentration but symptoms are manageable.  Continue lamotrigine 200 mg daily, temazepam 15 mg at bedtime and citalopram 20 mg daily.  Recommended to call us back if she has any question or any concern.  Follow-up in 3 months.  Collaboration of Care: Collaboration of Care: Other provider involved in patient's care AEB notes are available in epic to review  Patient/Guardian was advised Release of Information must be obtained prior to any record release in order to collaborate their care with an outside provider. Patient/Guardian was advised if they have not already done so to contact the registration department to sign all necessary forms in order for [place] to release information regarding their care.   Consent: Patient/Guardian gives verbal consent for treatment and assignment of benefits for services provided during this visit. Patient/Guardian expressed understanding and agreed to proceed.    Follow Up Instructions:    I discussed the assessment and treatment plan with the patient. The patient was provided an opportunity to ask questions and all were answered. The patient agreed with the plan and demonstrated an understanding of the instructions.   The patient was advised to call back or seek an in-person evaluation if the symptoms worsen  or if the condition fails to improve as anticipated.  I provided 19 minutes of non-face-to-face time during this encounter.   Cleotis Nipper, MD 12/21/2023, 8:23 AM

## 2024-01-04 DIAGNOSIS — Z1231 Encounter for screening mammogram for malignant neoplasm of breast: Secondary | ICD-10-CM

## 2024-01-06 ENCOUNTER — Ambulatory Visit
Admission: RE | Admit: 2024-01-06 | Discharge: 2024-01-06 | Disposition: A | Discharge status: Home / Self Care | Source: Ambulatory Visit | Attending: Nurse Practitioner | Admitting: Nurse Practitioner

## 2024-01-06 DIAGNOSIS — Z1231 Encounter for screening mammogram for malignant neoplasm of breast: Secondary | ICD-10-CM | POA: Diagnosis not present

## 2024-01-07 ENCOUNTER — Encounter: Payer: Self-pay | Admitting: Nurse Practitioner

## 2024-01-18 ENCOUNTER — Ambulatory Visit (INDEPENDENT_AMBULATORY_CARE_PROVIDER_SITE_OTHER): Admitting: Internal Medicine

## 2024-01-18 ENCOUNTER — Encounter (INDEPENDENT_AMBULATORY_CARE_PROVIDER_SITE_OTHER): Payer: Self-pay | Admitting: Internal Medicine

## 2024-01-18 VITALS — BP 113/74 | HR 87 | Temp 97.8°F | Ht 69.0 in | Wt 207.0 lb

## 2024-01-18 DIAGNOSIS — E7800 Pure hypercholesterolemia, unspecified: Secondary | ICD-10-CM | POA: Diagnosis not present

## 2024-01-18 DIAGNOSIS — K760 Fatty (change of) liver, not elsewhere classified: Secondary | ICD-10-CM

## 2024-01-18 DIAGNOSIS — E88819 Insulin resistance, unspecified: Secondary | ICD-10-CM

## 2024-01-18 DIAGNOSIS — Z6836 Body mass index (BMI) 36.0-36.9, adult: Secondary | ICD-10-CM

## 2024-01-18 DIAGNOSIS — E66812 Obesity, class 2: Secondary | ICD-10-CM

## 2024-01-18 MED ORDER — WEGOVY 2.4 MG/0.75ML ~~LOC~~ SOAJ: 2.4000 mg | SUBCUTANEOUS | 0 refills | Status: DC

## 2024-01-18 NOTE — Assessment & Plan Note (Signed)
 Stable.  We are checking disease monitoring labs today please refer to orders.  She will continue to maintain a diet with a low glycemic load.  She will continue with [facility] for pharmacoprophylaxis

## 2024-01-18 NOTE — Progress Notes (Signed)
 Office: [phone]  /  Fax: [phone]  Weight Summary And Biometrics  Vitals Temp: 97.8 F (36.6 C) BP: 113/74 Pulse Rate: 87 SpO2: 100 %   Anthropometric Measurements Height: 5\' 9"  (1.753 m) Weight: 207 lb (93.9 kg) BMI (Calculated): 30.55 Weight at Last Visit: 220 lb Weight Lost Since Last Visit: 13 lb Weight Gained Since Last Visit: 0 lb Starting Weight: 268 lb Total Weight Loss (lbs): 61 lb (27.7 kg) Peak Weight: 450 lb   Body Composition  Body Fat %: 38.6 % Fat Mass (lbs): 80 lbs Muscle Mass (lbs): 120.8 lbs Total Body Water (lbs): 80.8 lbs Visceral Fat Rating : 8    No data recorded Today's Visit #: 19  Starting Date: 08/28/22   Subjective   Chief Complaint: Obesity  Sarah Salas is here to discuss her progress with her obesity treatment plan. She is on the the Category 2 Plan and states she is following her eating plan approximately 80-90% of the time. She states she is exercising  120 minutes 3 times per week.  Weight Progress Since Last Visit:  Since last office visit she has lost 13 pounds. She reports good adherence to reduced calorie nutritional plan. She has been working on reading food labels, not skipping meals, increasing protein intake at every meal, eating more fruits, eating more vegetables, drinking more water, making healthier choices, reducing portion sizes, and incorporating more whole foods.  Last visit we had discussed incorporating sixteen 8 fast and increasing protein intake which she did.  She is very happy and excited about her weight loss.  Challenges affecting patient progress: none.   Orexigenic Control: Reports improved problems with appetite and hunger signals.  Reports improved problems with satiety and satiation.  Denies problems with eating patterns and portion control.  Denies abnormal cravings. Denies feeling deprived or restricted.   Pharmacotherapy for weight management: She is currently taking Wegovy with adequate  clinical response  and without side effects..   Assessment and Plan   Treatment Plan For Obesity:  Recommended Dietary Goals  Sarah Salas is currently in the action stage of change. As such, her goal is to continue weight management plan. She has agreed to: continue current plan  Behavioral Health and Counseling  We discussed the following behavioral modification strategies today: avoiding skipping meals and increasing water intake .  Additional education and resources provided today: None  Recommended Physical Activity Goals  Sarah Salas has been advised to work up to 150 minutes of moderate intensity aerobic activity a week and strengthening exercises 2-3 times per week for cardiovascular health, weight loss maintenance and preservation of muscle mass.   She has agreed to :  Continue current level of physical activity   Pharmacotherapy  We discussed various medication options to help Iliza with her weight loss efforts and we both agreed to : adequate clinical response to current dose, continue current regimen  Associated Conditions Impacted by Obesity Treatment  Pure hypercholesterolemia Assessment & Plan: Stable currently not on lipid-lowering treatment due to low cardiovascular risk.  Rechecking fasting lipid profile today for reevaluation  Orders: -     Lipid Panel With LDL/HDL Ratio  Insulin resistance Assessment & Plan: Stable.  We are checking disease monitoring labs today please refer to orders.  She will continue to maintain a diet with a low glycemic load.  She will [facility] with [facility] for pharmacoprophylaxis  Orders: -     BMWUXL; Inject 2.4 mg into the skin once a week.  Dispense: 3 mL; Refill:  0 -     Insulin, random -     Hemoglobin A1c  Metabolic dysfunction-associated steatotic liver disease (MASLD) Assessment & Plan: Stable.  No reports of easy bruising.  We will check liver enzymes, PT and INR and CBC today.  We will calculate fib 4 score after test results.   She has lost 23% of total body weight since starting her program and is also on GLP-1 so I suspect her condition has improved and is stable.  Orders: -     Wegovy; Inject 2.4 mg into the skin once a week.  Dispense: 3 mL; Refill: 0 -     CBC with Differential/Platelet -     CMP14+EGFR -     Protime-INR  Class 2 severe obesity with serious comorbidity and body mass index (BMI) of 36.0 to 36.9 in adult, unspecified obesity type [facility]) Assessment & Plan: Patient has lost 54% of total body weight from her peak weight of 450 and 23% of starting weight of 268.  She is on a reduced calorie nutrition plan targeting 1200 cal, 90 to 120 g of protein and has incorporated 16:8 fast.  She lost 13 pounds in 4 weeks with preservation of muscle mass according to bioimpedance information.  She will continue current weight management strategy inclusive of GLP-1 therapy with Wegovy 2.4 mg once a week  Orders: -     ZOXWRU; Inject 2.4 mg into the skin once a week.  Dispense: 3 mL; Refill: 0     Objective   Physical Exam:  Blood pressure 113/74, pulse 87, temperature 97.8 F (36.6 C), height 5\' 9"  (1.753 m), weight 207 lb (93.9 kg), SpO2 100%. Body mass index is 30.57 kg/m.  General: She is overweight, cooperative, alert, well developed, and in no acute distress. PSYCH: Has normal mood, affect and thought process.   HEENT: EOMI, sclerae are anicteric. Lungs: Normal breathing effort, no conversational dyspnea. Extremities: No edema.  Neurologic: No gross sensory or motor deficits. No tremors or fasciculations noted.    Diagnostic Data Reviewed:  BMET    Component Value Date/Time   NA 144 07/19/2023 1155   NA 143 02/22/2023 0744   K 4.6 07/19/2023 1155   CL 105 07/19/2023 1155   CO2 30 07/19/2023 1155   GLUCOSE 93 07/19/2023 1155   BUN 12 07/19/2023 1155   BUN 16 02/22/2023 0744   CREATININE 0.73 07/19/2023 1155   CALCIUM 10.0 07/19/2023 1155   GFRNONAA >60 05/20/2022 0155   GFRAA 101  11/25/2020 0919   Lab Results  Component Value Date   HGBA1C 5.6 11/04/2022   HGBA1C 5.3 12/26/2019   Lab Results  Component Value Date   INSULIN 13.6 11/04/2022   Lab Results  Component Value Date   TSH 1.250 01/13/2023   CBC    Component Value Date/Time   WBC 5.6 01/13/2023 1344   WBC 6.7 05/12/2022 0959   RBC 4.57 01/13/2023 1344   RBC 4.85 05/12/2022 0959   HGB 13.4 01/13/2023 1344   HCT 40.1 01/13/2023 1344   PLT 266 01/13/2023 1344   MCV 88 01/13/2023 1344   MCH 29.3 01/13/2023 1344   MCH 29.5 05/12/2022 0959   MCHC 33.4 01/13/2023 1344   MCHC 32.9 05/12/2022 0959   RDW 13.0 01/13/2023 1344   Iron Studies    Component Value Date/Time   IRON 68 11/18/2022 1117   TIBC 329 11/18/2022 1117   FERRITIN 62 11/18/2022 1117   IRONPCTSAT 21 11/18/2022 1117   Lipid  Panel     Component Value Date/Time   CHOL 201 (H) 01/13/2023 1344   TRIG 138 01/13/2023 1344   HDL 47 01/13/2023 1344   CHOLHDL 4.3 01/13/2023 1344   LDLCALC 129 (H) 01/13/2023 1344   Hepatic Function Panel     Component Value Date/Time   PROT 6.9 11/02/2023 1024   PROT 6.8 02/22/2023 0744   ALBUMIN 4.5 11/02/2023 1024   ALBUMIN 4.8 02/22/2023 0744   AST 13 11/02/2023 1024   ALT 14 11/02/2023 1024   ALKPHOS 77 11/02/2023 1024   BILITOT 0.3 11/02/2023 1024   BILITOT 0.5 02/22/2023 0744   BILIDIR 0.0 11/02/2023 1024      Component Value Date/Time   TSH 1.250 01/13/2023 1344   Nutritional Lab Results  Component Value Date   VD25OH 49.8 04/19/2023   VD25OH 21.8 (L) 11/04/2022    Medications: Outpatient Encounter Medications as of 01/18/2024  Medication Sig   citalopram (CELEXA) 20 MG tablet Take 1 tablet (20 mg total) by mouth daily.   ergocalciferol (VITAMIN D2) 1.25 MG (50000 UT) capsule Take 1 capsule (50,000 Units total) by mouth once a week.   lamoTRIgine (LAMICTAL) 200 MG tablet Take 1 tablet (200 mg total) by mouth daily.   metFORMIN (GLUCOPHAGE-XR) 500 MG 24 hr tablet Take 1  tablet (500 mg total) by mouth 2 (two) times daily with a meal.   methocarbamol (ROBAXIN) 500 MG tablet Take 1 tablet (500 mg total) by mouth 4 (four) times daily.   temazepam (RESTORIL) 15 MG capsule Take 1 capsule (15 mg total) by mouth at bedtime as needed for sleep.   [DISCONTINUED] Semaglutide-Weight Management (WEGOVY) 2.4 MG/0.75ML SOAJ Inject 2.4 mg into the skin once a week.   diclofenac Sodium (VOLTAREN) 1 % GEL Apply 4 g topically 4 (four) times daily. (Patient not taking: Reported on 01/18/2024)   ibuprofen (ADVIL) 800 MG tablet Take 1 tablet (800 mg total) by mouth every 8 (eight) hours as needed. (Patient not taking: Reported on 01/18/2024)   latanoprost (XALATAN) 0.005 % ophthalmic solution Place 1 drop into both eyes at bedtime. (Patient not taking: Reported on 01/18/2024)   ondansetron (ZOFRAN-ODT) 4 MG disintegrating tablet Take 1 tablet (4 mg total) by mouth every 8 (eight) hours as needed for nausea or vomiting. (Patient not taking: Reported on 01/18/2024)   Semaglutide-Weight Management (WEGOVY) 2.4 MG/0.75ML SOAJ Inject 2.4 mg into the skin once a week.   No facility-administered encounter medications on file as of 01/18/2024.     Follow-Up   Return in about 4 weeks (around 02/15/2024) for For Weight Mangement with Dr. Rikki Spearing.Marland Kitchen She was informed of the importance of frequent follow up visits to maximize her success with intensive lifestyle modifications for her multiple health conditions.  Attestation Statement   Reviewed by clinician on day of visit: allergies, medications, problem list, medical history, surgical history, family history, social history, and previous encounter notes.     Worthy Rancher, MD

## 2024-01-18 NOTE — Assessment & Plan Note (Signed)
 Stable.  No reports of easy bruising.  We will check liver enzymes, PT and INR and CBC today.  We will calculate fib 4 score after test results.  She has lost 23% of total body weight since starting her program and is also on GLP-1 so I suspect her condition has improved and is stable.

## 2024-01-18 NOTE — Assessment & Plan Note (Signed)
 Patient has lost 54% of total body weight from her peak weight of 450 and 23% of starting weight of 268.  She is on a reduced calorie nutrition plan targeting 1200 cal, 90 to 120 g of protein and has incorporated 16:8 fast.  She lost 13 pounds in 4 weeks with preservation of muscle mass according to bioimpedance information.  She will continue current weight management strategy inclusive of GLP-1 therapy with Wegovy 2.4 mg once a week

## 2024-01-18 NOTE — Assessment & Plan Note (Signed)
 Stable currently not on lipid-lowering treatment due to low cardiovascular risk.  Rechecking fasting lipid profile today for reevaluation

## 2024-01-19 LAB — CBC WITH DIFFERENTIAL/PLATELET
Basophils Absolute: 0 10*3/uL (ref 0.0–0.2)
Basos: 1 %
EOS (ABSOLUTE): 0.3 10*3/uL (ref 0.0–0.4)
Eos: 6 %
Hematocrit: 43.7 % (ref 34.0–46.6)
Hemoglobin: 14.5 g/dL (ref 11.1–15.9)
Immature Grans (Abs): 0 10*3/uL (ref 0.0–0.1)
Immature Granulocytes: 0 %
Lymphocytes Absolute: 1.4 10*3/uL (ref 0.7–3.1)
Lymphs: 29 %
MCH: 29.2 pg (ref 26.6–33.0)
MCHC: 33.2 g/dL (ref 31.5–35.7)
MCV: 88 fL (ref 79–97)
Monocytes Absolute: 0.4 10*3/uL (ref 0.1–0.9)
Monocytes: 8 %
Neutrophils Absolute: 2.8 10*3/uL (ref 1.4–7.0)
Neutrophils: 56 %
Platelets: 311 10*3/uL (ref 150–450)
RBC: 4.96 x10E6/uL (ref 3.77–5.28)
RDW: 13 % (ref 11.7–15.4)
WBC: 5 10*3/uL (ref 3.4–10.8)

## 2024-01-19 LAB — CMP14+EGFR
ALT: 12 IU/L (ref 0–32)
AST: 16 IU/L (ref 0–40)
Albumin: 4.7 g/dL (ref 3.9–4.9)
Alkaline Phosphatase: 92 IU/L (ref 44–121)
BUN/Creatinine Ratio: 13 (ref 9–23)
BUN: 10 mg/dL (ref 6–24)
Bilirubin Total: 0.3 mg/dL (ref 0.0–1.2)
CO2: 23 mmol/L (ref 20–29)
Calcium: 10 mg/dL (ref 8.7–10.2)
Chloride: 104 mmol/L (ref 96–106)
Creatinine, Ser: 0.77 mg/dL (ref 0.57–1.00)
Globulin, Total: 2.2 g/dL (ref 1.5–4.5)
Glucose: 83 mg/dL (ref 70–99)
Potassium: 5.1 mmol/L (ref 3.5–5.2)
Sodium: 143 mmol/L (ref 134–144)
Total Protein: 6.9 g/dL (ref 6.0–8.5)
eGFR: 96 mL/min/{1.73_m2} (ref >59)

## 2024-01-19 LAB — INSULIN, RANDOM: INSULIN: 7.6 u[IU]/mL (ref 2.6–24.9)

## 2024-01-19 LAB — LIPID PANEL WITH LDL/HDL RATIO
Cholesterol, Total: 191 mg/dL (ref 100–199)
HDL: 53 mg/dL (ref >39)
LDL Chol Calc (NIH): 119 mg/dL — ABNORMAL HIGH (ref 0–99)
LDL/HDL Ratio: 2.2 ratio (ref 0.0–3.2)
Triglycerides: 106 mg/dL (ref 0–149)
VLDL Cholesterol Cal: 19 mg/dL (ref 5–40)

## 2024-01-19 LAB — HEMOGLOBIN A1C
Est. average glucose Bld gHb Est-mCnc: 103 mg/dL
Hgb A1c MFr Bld: 5.2 % (ref 4.8–5.6)

## 2024-01-19 LAB — PROTIME-INR
INR: 1 (ref 0.9–1.2)
Prothrombin Time: 11.2 s (ref 9.1–12.0)

## 2024-01-20 ENCOUNTER — Encounter (INDEPENDENT_AMBULATORY_CARE_PROVIDER_SITE_OTHER): Payer: Self-pay | Admitting: Internal Medicine

## 2024-01-25 ENCOUNTER — Ambulatory Visit: Payer: Medicaid Other | Attending: Nurse Practitioner | Admitting: Nurse Practitioner

## 2024-01-25 ENCOUNTER — Encounter: Payer: Self-pay | Admitting: Nurse Practitioner

## 2024-01-25 VITALS — BP 122/74 | HR 68 | Ht 69.0 in | Wt 208.6 lb

## 2024-01-25 DIAGNOSIS — Z0000 Encounter for general adult medical examination without abnormal findings: Secondary | ICD-10-CM

## 2024-01-25 DIAGNOSIS — E88819 Insulin resistance, unspecified: Secondary | ICD-10-CM

## 2024-01-25 MED ORDER — METFORMIN HCL ER 500 MG PO TB24: 500.0000 mg | ORAL_TABLET | Freq: Two times a day (BID) | ORAL | 1 refills | Status: DC

## 2024-01-25 NOTE — Progress Notes (Signed)
 Assessment & Plan:  Sarah Salas was seen today for annual exam.  Diagnoses and all orders for this visit:  Encounter for annual physical exam  Insulin resistance -     metFORMIN (GLUCOPHAGE-XR) 500 MG 24 hr tablet; Take 1 tablet (500 mg total) by mouth 2 (two) times daily with a meal.    Patient has been counseled on age-appropriate routine health concerns for screening and prevention. These are reviewed and up-to-date. Referrals have been placed accordingly. Immunizations are up-to-date or declined.    Subjective:   Chief Complaint  Patient presents with   Annual Exam    Sarah Salas 48 y.o. female presents to office today for annual physical exam She is doing excellent with her weight loss. Initial weight 262 and now down to 208. She feels great. Exercising 5 days per week and being followed by healthy weight and wellness.    BP Readings from Last 3 Encounters:  01/25/24 122/74  01/18/24 113/74  12/16/23 102/65     Wt Readings from Last 3 Encounters:  01/25/24 208 lb 9.6 oz (94.6 kg)  01/18/24 207 lb (93.9 kg)  12/16/23 220 lb (99.8 kg)     Review of Systems  Constitutional:  Negative for fever, malaise/fatigue and weight loss.  HENT: Negative.  Negative for nosebleeds.   Eyes: Negative.  Negative for blurred vision, double vision and photophobia.  Respiratory: Negative.  Negative for cough and shortness of breath.   Cardiovascular: Negative.  Negative for chest pain, palpitations and leg swelling.  Gastrointestinal: Negative.  Negative for heartburn, nausea and vomiting.  Genitourinary: Negative.   Musculoskeletal: Negative.  Negative for myalgias.  Skin: Negative.   Neurological: Negative.  Negative for dizziness, focal weakness, seizures and headaches.  Endo/Heme/Allergies: Negative.   Psychiatric/Behavioral: Negative.  Negative for suicidal ideas.     Past Medical History:  Diagnosis Date   ADHD (attention deficit hyperactivity disorder)    Allergy     Normal allergies   Anxiety    Arthritis    Bipolar 1 disorder (HCC)    Brain injury (HCC) 2005   Glaucoma    Hyperlipidemia    Hypertension    no longer treated    Past Surgical History:  Procedure Laterality Date   BRAIN SURGERY  10/28/2003   from [facility]   FRACTURE SURGERY  10/28/2003   KNEE ARTHROSCOPY WITH MENISCAL REPAIR Right 05/19/2022   Procedure: RIGHT KNEE ARTHROSCOPY, MEDIAL MENISCAL DEBRIDEMENT AND ABRASION ARTHROPLASTY;  Surgeon: Huel Cote, MD;  Location: [facility] OR;  Service: Orthopedics;  Laterality: Right;   TIBIA OSTEOTOMY Right 05/19/2022   Procedure: RIGHT KNEE HIGH TIBIAL OSTEOTOMY;  Surgeon: Huel Cote, MD;  Location: [facility] OR;  Service: Orthopedics;  Laterality: Right;   WISDOM TOOTH EXTRACTION      Family History  Problem Relation Age of Onset   Bipolar disorder Mother    Hypertension Mother    Miscarriages / [place] Mother    Vision loss Mother    Varicose Veins Mother    High Cholesterol Mother    Anxiety disorder Mother    Obesity Mother    Alcohol abuse Father    Heart disease Father    Learning disabilities Father    High blood pressure Father    High Cholesterol Father    Sudden death Father    Obesity Father    Bipolar disorder Sister    Learning disabilities Sister    ADD / ADHD Sister    Bipolar disorder Maternal Grandmother  Mental illness Maternal Grandmother    Hypertension Maternal Grandfather    Cancer Paternal Grandmother    Cancer Paternal Grandfather    Diabetes Paternal Aunt    Colon cancer Neg Hx    Colon polyps Neg Hx    Esophageal cancer Neg Hx    Rectal cancer Neg Hx    Stomach cancer Neg Hx     Social History Reviewed with no changes to be made today.   Outpatient Medications Prior to Visit  Medication Sig Dispense Refill   citalopram (CELEXA) 20 MG tablet Take 1 tablet (20 mg total) by mouth daily. 30 tablet 2   diclofenac Sodium (VOLTAREN) 1 % GEL Apply 4 g topically 4 (four) times daily. 50 g 3    ergocalciferol (VITAMIN D2) 1.25 MG (50000 UT) capsule Take 1 capsule (50,000 Units total) by mouth once a week. 12 capsule 0   methocarbamol (ROBAXIN) 500 MG tablet Take 1 tablet (500 mg total) by mouth 4 (four) times daily. 60 tablet 1   Semaglutide-Weight Management (WEGOVY) 2.4 MG/0.75ML SOAJ Inject 2.4 mg into the skin once a week. 3 mL 0   temazepam (RESTORIL) 15 MG capsule Take 1 capsule (15 mg total) by mouth at bedtime as needed for sleep. 30 capsule 2   metFORMIN (GLUCOPHAGE-XR) 500 MG 24 hr tablet Take 1 tablet (500 mg total) by mouth 2 (two) times daily with a meal. 180 tablet 0   lamoTRIgine (LAMICTAL) 200 MG tablet Take 1 tablet (200 mg total) by mouth daily. (Patient not taking: Reported on 01/25/2024) 30 tablet 2   ibuprofen (ADVIL) 800 MG tablet Take 1 tablet (800 mg total) by mouth every 8 (eight) hours as needed. (Patient not taking: Reported on 01/18/2024) 60 tablet 1   latanoprost (XALATAN) 0.005 % ophthalmic solution Place 1 drop into both eyes at bedtime. (Patient not taking: Reported on 01/18/2024) 2.5 mL 6   ondansetron (ZOFRAN-ODT) 4 MG disintegrating tablet Take 1 tablet (4 mg total) by mouth every 8 (eight) hours as needed for nausea or vomiting. (Patient not taking: Reported on 01/18/2024) 20 tablet 0   No facility-administered medications prior to visit.    No Known Allergies     Objective:    BP 122/74 (BP Location: Left Arm, Patient Position: Sitting, Cuff Size: Normal)   Pulse 68   Ht 5\' 9"  (1.753 m)   Wt 208 lb 9.6 oz (94.6 kg)   LMP  (LMP Unknown)   BMI 30.80 kg/m  Wt Readings from Last 3 Encounters:  01/25/24 208 lb 9.6 oz (94.6 kg)  01/18/24 207 lb (93.9 kg)  12/16/23 220 lb (99.8 kg)    Physical Exam Constitutional:      Appearance: She is well-developed.  HENT:     Head: Normocephalic and atraumatic.     Right Ear: Hearing, tympanic membrane, ear canal and external ear normal.     Left Ear: Hearing, tympanic membrane, ear canal and external ear  normal.     Nose: Nose normal.     Right Turbinates: Not enlarged.     Left Turbinates: Not enlarged.     Mouth/Throat:     Lips: Pink.     Mouth: Mucous membranes are moist.     Dentition: No dental tenderness, gingival swelling, dental abscesses or gum lesions.     Pharynx: No oropharyngeal exudate.  Eyes:     General: No scleral icterus.       Right eye: No discharge.     Extraocular Movements:  Extraocular movements intact.     Conjunctiva/sclera: Conjunctivae normal.     Pupils: Pupils are equal, round, and reactive to light.  Neck:     Thyroid: No thyromegaly.     Trachea: No tracheal deviation.  Cardiovascular:     Rate and Rhythm: Normal rate and regular rhythm.     Heart sounds: Normal heart sounds. No murmur heard.    No friction rub.  Pulmonary:     Effort: Pulmonary effort is normal. No accessory muscle usage or respiratory distress.     Breath sounds: Normal breath sounds. No decreased breath sounds, wheezing, rhonchi or rales.  Abdominal:     General: Bowel sounds are normal. There is no distension.     Palpations: Abdomen is soft. There is no mass.     Tenderness: There is no abdominal tenderness. There is no right CVA tenderness, left CVA tenderness, guarding or rebound.     Hernia: No hernia is present.  Musculoskeletal:        General: No tenderness or deformity. Normal range of motion.     Cervical back: Normal range of motion and neck supple.  Lymphadenopathy:     Cervical: No cervical adenopathy.  Skin:    General: Skin is warm and dry.     Findings: No erythema.  Neurological:     Mental Status: She is alert and oriented to person, place, and time.     Cranial Nerves: No cranial nerve deficit.     Motor: Motor function is intact.     Coordination: Coordination is intact. Coordination normal.     Gait: Gait is intact.     Deep Tendon Reflexes:     Reflex Scores:      Patellar reflexes are 1+ on the right side and 1+ on the left side. Psychiatric:         Attention and Perception: Attention normal.        Mood and Affect: Mood normal.        Speech: Speech normal.        Behavior: Behavior normal.        Thought Content: Thought content normal.        Judgment: Judgment normal.          Patient has been counseled extensively about nutrition and exercise as well as the importance of adherence with medications and regular follow-up. The patient was given clear instructions to go to ER or return to medical center if symptoms don't improve, worsen or new problems develop. The patient verbalized understanding.   Follow-up: Return if symptoms worsen or fail to improve.   Collins Dean, FNP-BC [facility] and [facility] [place], [place] [phone]   01/25/2024, 9:54 AM

## 2024-02-10 ENCOUNTER — Other Ambulatory Visit (INDEPENDENT_AMBULATORY_CARE_PROVIDER_SITE_OTHER): Payer: Self-pay | Admitting: Internal Medicine

## 2024-02-10 DIAGNOSIS — E66812 Obesity, class 2: Secondary | ICD-10-CM

## 2024-02-10 DIAGNOSIS — E88819 Insulin resistance, unspecified: Secondary | ICD-10-CM

## 2024-02-10 DIAGNOSIS — K760 Fatty (change of) liver, not elsewhere classified: Secondary | ICD-10-CM

## 2024-02-14 DIAGNOSIS — H5213 Myopia, bilateral: Secondary | ICD-10-CM | POA: Diagnosis not present

## 2024-02-14 NOTE — Progress Notes (Unsigned)
 SUBJECTIVE: Discussed the use of AI scribe software for clinical note transcription with the patient, who gave verbal consent to proceed.  Chief Complaint: Obesity  Interim History: She is down 2 lbs since her last visit.  Down 63 lbs overall since starting with [facility] 08/28/22 TBW loss of 23.5% Sarah Salas is here to discuss her progress with her obesity treatment plan. She is on the Category 2 Plan and states she is following her eating plan approximately 95 % of the time. She states she is exercising speed walking, gym, HIIT, weights for 90 minutes 5 times per week. Sarah Salas is a 48 year old female with obesity who presents for follow-up of her obesity treatment plan.  She has lost a total of 63 pounds since starting the program, with a starting weight of 450 pounds. Her BMI has decreased to 30.3, and she has gained almost two pounds of muscle. She is engaging in intermittent fasting and regular exercise, including speed walking and HIIT workouts. Her visceral fat rating has improved from 13 to 8, and her overall body fat percentage has decreased from 48.5% to 37.2%.  She has a history of insulin  resistance, vitamin D  deficiency, metabolic dysfunction, hypercholesterolemia, and elevated liver function studies. She is currently taking vitamin D  supplements and metformin , with a three-month supply remaining. She also uses Wegovy , which initially caused nausea, but this has improved with medication. No issues with swallowing, nausea, vomiting, constipation, diarrhea, vision changes, or mood changes.  Her blood pressure was noted to be on the lower side, which she attributes to not having had much to drink that morning.  She reports improved sleep, now sleeping over seven hours a night after a change in her sleep medication from trazodone , which she had been on for 19 years. She feels well-rested and her stress levels are low. 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  Pharmacotherapy for weight management: She is currently taking Wegovy  with adequate clinical response  and without side effects.. Denies mass in neck, dysphagia, dyspepsia, persistent hoarseness, abdominal pain, or N/V/Constipation or diarrhea. Has annual eye exam. Mood is stable.   OBJECTIVE: Visit Diagnoses: Problem List Items Addressed This Visit     Class 2  obesity with current BMI 33   Relevant Medications   Semaglutide -Weight Management (WEGOVY ) 2.4 MG/0.75ML SOAJ   Insulin  resistance - Primary   Relevant Medications   Semaglutide -Weight Management (WEGOVY ) 2.4 MG/0.75ML SOAJ   Metabolic dysfunction-associated steatotic liver disease (MASLD)   Relevant Medications   Semaglutide -Weight Management (WEGOVY ) 2.4 MG/0.75ML SOAJ   Other Visit Diagnoses       Generalized obesity- Start BMI 39.6 Date: 11/04/2022       Relevant Medications   Semaglutide -Weight Management (WEGOVY ) 2.4 MG/0.75ML SOAJ     BMI 30.0-30.9,adult Current BMI 30.4          Obesity Sarah Salas has lost 63 pounds, reducing her BMI to 30.3, and improved her visceral fat rating from 13 to 8, decreasing her risk for type 2 diabetes, fatty liver, heart disease, breast cancer, and colorectal cancer.  She follows an Category 2 plan consuming 85 grams of protein daily.  On Wegovy  2.4 mg weekly with good appetite and hunger signals, denies abnormal cravings. Good satiety.  Her weight loss is steady, supporting long-term maintenance.  She is motivated to reach a goal weight of 165 pounds and aims to be under 200 pounds  in the next four to six weeks. - Continue current weight loss plan Continue/refill Wegovy  2.4 mg weekly.  - Encourage regular exercise, including HIIT workouts and weight lifting. - Reinforce the importance of hydration, especially in the morning. Meds ordered this encounter  Medications   Semaglutide -Weight Management (WEGOVY ) 2.4 MG/0.75ML SOAJ     Sig: Inject 2.4 mg into the skin once a week.    Dispense:  3 mL    Refill:  0    Insulin  resistance Sarah Salas manages her insulin  resistance with metformin  and Wegovy , and her weight loss and dietary changes are improving her insulin  sensitivity. Lab Results  Component Value Date   HGBA1C 5.2 01/18/2024   HGBA1C 5.6 11/04/2022   HGBA1C 5.2 11/25/2020   Lab Results  Component Value Date   LDLCALC 119 (H) 01/18/2024   CREATININE 0.77 01/18/2024   INSULIN   Date Value Ref Range Status  01/18/2024 7.6 2.6 - 24.9 uIU/mL Final  ]Continue working on nutrition plan to decrease simple carbohydrates, increase lean proteins and exercise to promote weight loss, improve glycemic control and prevent progression to Type 2 diabetes.  - Continue metformin  500 mg BID as prescribed- PCP refilled.  MASLD Last ALT/AST normal.  Lab Results  Component Value Date   ALT 12 01/18/2024   AST 16 01/18/2024   GGT 32 11/18/2022   ALKPHOS 92 01/18/2024   BILITOT 0.3 01/18/2024  Plan:  Lab results reviewed with patient.  Disease counseling done.  Intensive lifestyle modifications are the first line treatment for this issue.  We discussed several lifestyle modifications today and she will continue to work on diet, exercise and weight loss efforts.   Counseling: NAFLD is an umbrella term that encompasses a disease spectrum that includes steatosis (fat) without inflammation, steatohepatitis (NASH; fat + inflammation in a characteristic pattern), and cirrhosis. Bland steatosis is felt to be a benign condition, with extremely low to no risk of progression to cirrhosis, whereas NASH can progress to cirrhosis. The mainstay of treatment of NAFLD includes lifestyle modification to achieve weight loss, at least 7% of current body weight. Low carbohydrate diets can be beneficial in improving NAFLD liver histology. Additionally, exercise, even the absence of weight loss can have beneficial effects on the patient's  metabolic profile and liver health. We recommend that their metabolic comorbidities be aggressively managed, as patients with NAFLD are at increased risk of coronary artery disease.  Vitamin D  Deficiency Vitamin D  is at goal of 50.  Most recent vitamin D  level was 50. She is on  prescription ergocalciferol  50,000 IU weekly. No N/V or other side effects with Ergocalciferol .  Lab Results  Component Value Date   VD25OH 49.8 04/19/2023   VD25OH 21.8 (L) 11/04/2022    Plan:   Low vitamin D  levels can be associated with adiposity and may result in leptin resistance and weight gain. Also associated with fatigue.  Currently on vitamin D  supplementation without any adverse effects such as nausea, vomiting or muscle weakness.   Eating disorder/emotional eating Sarah Salas has had issues with stress/emotional eating. Currently this is well controlled. Overall mood is stable. Medication(s): Other: Celexa  20 mg daily, Lamictal  200 mg daily and Restoril  to promote sleep per Psychiatry  Plan: Continue usual medications Sarah Salas manages emotional eating effectively as part of her weight loss plan, supported by her faith and family. - Support emotional well-being and stress management strategies. - Encourage continued use of support systems, including faith and family.  Vitals Temp: 97.9 F (36.6 C) BP: 99/65  Pulse Rate: 73 SpO2: 99 %   Anthropometric Measurements Height: 5\' 9"  (1.753 m) Weight: 205 lb (93 kg) BMI (Calculated): 30.26 Weight at Last Visit: 207 lb Weight Lost Since Last Visit: 2 lb Weight Gained Since Last Visit: 0 Starting Weight: 268 b Total Weight Loss (lbs): 63 lb (28.6 kg) Peak Weight: 450 lb   Body Composition  Body Fat %: 37.2 % Fat Mass (lbs): 76.4 lbs Muscle Mass (lbs): 122.6 lbs Total Body Water (lbs): 79.8 lbs Visceral Fat Rating : 8   Other Clinical Data Fasting: yes Labs: no Today's Visit #: 20 Starting Date: 08/28/22     ASSESSMENT AND  PLAN:  Diet: Sarah Salas is currently in the action stage of change. As such, her goal is to continue with weight loss efforts. She has agreed to Category 2 Plan.  Exercise: Sarah Salas has been instructed to continue exercising as is for weight loss and overall health benefits.   Behavior Modification:  We discussed the following Behavioral Modification Strategies today: increasing lean protein intake, decreasing simple carbohydrates, increasing vegetables, increase H2O intake, increase high fiber foods, no skipping meals, emotional eating strategies , avoiding temptations, and planning for success. We discussed various medication options to help Sarah Salas with her weight loss efforts and we both agreed to continue current treatment plan, continue to work on nutritional and behavioral strategies to promote weight loss.  .  Return in about 4 weeks (around 03/14/2024).Sarah Salas She was informed of the importance of frequent follow up visits to maximize her success with intensive lifestyle modifications for her multiple health conditions.  Attestation Statements:   Reviewed by clinician on day of visit: allergies, medications, problem list, medical history, surgical history, family history, social history, and previous encounter notes.   Time spent on visit including pre-visit chart review and post-visit care and charting was 31 minutes.    Sarah Frayre, PA-C

## 2024-02-15 ENCOUNTER — Encounter (INDEPENDENT_AMBULATORY_CARE_PROVIDER_SITE_OTHER): Payer: Self-pay | Admitting: Physician Assistant

## 2024-02-15 ENCOUNTER — Ambulatory Visit (INDEPENDENT_AMBULATORY_CARE_PROVIDER_SITE_OTHER): Admitting: Physician Assistant

## 2024-02-15 VITALS — BP 99/65 | HR 73 | Temp 97.9°F | Ht 69.0 in | Wt 205.0 lb

## 2024-02-15 DIAGNOSIS — F5089 Other specified eating disorder: Secondary | ICD-10-CM | POA: Diagnosis not present

## 2024-02-15 DIAGNOSIS — E66812 Obesity, class 2: Secondary | ICD-10-CM | POA: Diagnosis not present

## 2024-02-15 DIAGNOSIS — K760 Fatty (change of) liver, not elsewhere classified: Secondary | ICD-10-CM

## 2024-02-15 DIAGNOSIS — E669 Obesity, unspecified: Secondary | ICD-10-CM

## 2024-02-15 DIAGNOSIS — E88819 Insulin resistance, unspecified: Secondary | ICD-10-CM

## 2024-02-15 DIAGNOSIS — E559 Vitamin D deficiency, unspecified: Secondary | ICD-10-CM

## 2024-02-15 DIAGNOSIS — Z6830 Body mass index (BMI) 30.0-30.9, adult: Secondary | ICD-10-CM | POA: Diagnosis not present

## 2024-02-15 MED ORDER — WEGOVY 2.4 MG/0.75ML ~~LOC~~ SOAJ: 2.4000 mg | SUBCUTANEOUS | 0 refills | Status: DC

## 2024-02-21 ENCOUNTER — Telehealth (INDEPENDENT_AMBULATORY_CARE_PROVIDER_SITE_OTHER): Payer: Self-pay

## 2024-02-21 ENCOUNTER — Encounter (INDEPENDENT_AMBULATORY_CARE_PROVIDER_SITE_OTHER): Payer: Self-pay

## 2024-02-21 NOTE — Telephone Encounter (Signed)
 PA approved - Wegovy , pt notified

## 2024-02-21 NOTE — Telephone Encounter (Signed)
 PA - Wegovy  2.4 mg started

## 2024-02-27 ENCOUNTER — Other Ambulatory Visit: Payer: Self-pay | Admitting: Nurse Practitioner

## 2024-02-27 ENCOUNTER — Encounter: Payer: Self-pay | Admitting: Nurse Practitioner

## 2024-02-27 DIAGNOSIS — S86892A Other injury of other muscle(s) and tendon(s) at lower leg level, left leg, initial encounter: Secondary | ICD-10-CM

## 2024-02-27 DIAGNOSIS — M79605 Pain in left leg: Secondary | ICD-10-CM

## 2024-02-27 MED ORDER — DICLOFENAC SODIUM 1 % EX GEL
4.0000 g | Freq: Four times a day (QID) | CUTANEOUS | 3 refills | Status: AC
Start: 2024-02-27 — End: ?

## 2024-02-27 MED ORDER — MELOXICAM 7.5 MG PO TABS
7.5000 mg | ORAL_TABLET | Freq: Every day | ORAL | 0 refills | Status: DC
Start: 2024-02-27 — End: 2024-03-14

## 2024-03-07 ENCOUNTER — Emergency Department (HOSPITAL_BASED_OUTPATIENT_CLINIC_OR_DEPARTMENT_OTHER)
Admission: EM | Admit: 2024-03-07 | Discharge: 2024-03-07 | Disposition: A | Discharge status: Home / Self Care | Attending: Emergency Medicine | Admitting: Emergency Medicine

## 2024-03-07 ENCOUNTER — Other Ambulatory Visit (HOSPITAL_BASED_OUTPATIENT_CLINIC_OR_DEPARTMENT_OTHER): Payer: Self-pay

## 2024-03-07 ENCOUNTER — Other Ambulatory Visit: Payer: Self-pay

## 2024-03-07 ENCOUNTER — Encounter (HOSPITAL_BASED_OUTPATIENT_CLINIC_OR_DEPARTMENT_OTHER): Payer: Self-pay | Admitting: Emergency Medicine

## 2024-03-07 DIAGNOSIS — L03012 Cellulitis of left finger: Secondary | ICD-10-CM | POA: Insufficient documentation

## 2024-03-07 DIAGNOSIS — I10 Essential (primary) hypertension: Secondary | ICD-10-CM | POA: Diagnosis not present

## 2024-03-07 MED ORDER — LIDOCAINE HCL (PF) 1 % IJ SOLN
10.0000 mL | Freq: Once | INTRAMUSCULAR | Status: AC
  Administered 2024-03-07: 10 mL
  Filled 2024-03-07: qty 10

## 2024-03-07 MED ORDER — CEPHALEXIN 500 MG PO CAPS
500.0000 mg | ORAL_CAPSULE | Freq: Four times a day (QID) | ORAL | 0 refills | Status: DC
  Filled 2024-03-07: qty 20, 5d supply, fill #0

## 2024-03-07 MED ORDER — HYDROCODONE-ACETAMINOPHEN 5-325 MG PO TABS
1.0000 | ORAL_TABLET | Freq: Four times a day (QID) | ORAL | 0 refills | Status: DC | PRN
  Filled 2024-03-07: qty 10, 2d supply, fill #0

## 2024-03-07 NOTE — Discharge Instructions (Addendum)
 Recommend warm soaks twice daily, take antibiotics as prescribed, continue to wrap the finger, watch for signs of worsening infection which include worsening redness, swelling of the entire finger, holding the finger in flexion with tenderness about the base of the finger along the palmar side.  Follow-up with your PCP to ensure resolution.

## 2024-03-07 NOTE — ED Provider Notes (Signed)
 [facility] AT [facility] Provider Note   CSN: [id number] Arrival date & time: 03/07/24  0756     History  Chief Complaint  Patient presents with   Finger Swelling    Sarah Salas is a 48 y.o. female.  HPI   48 year old female with medical history significant for HTN, bipolar disorder, ADHD, HLD who presents to the emergency department with a chief complaint of left index finger swelling.  The patient states that she normally gets ingrown nails.  She is right-handed.  She states that over the last 3 to 4 days she has had swelling and pain at the tip of her index finger.  No full on swelling of the entire digit. No fevers or chills.  Home Medications Prior to Admission medications   Medication Sig Start Date End Date Taking? Authorizing Provider  cephALEXin (KEFLEX) 500 MG capsule Take 1 capsule (500 mg total) by mouth 4 (four) times daily. 03/07/24  Yes Rosealee Concha, MD  HYDROcodone -acetaminophen  (NORCO/VICODIN) 5-325 MG tablet Take 1-2 tablets by mouth every 6 (six) hours as needed. 03/07/24  Yes Rosealee Concha, MD  citalopram  (CELEXA ) 20 MG tablet Take 1 tablet (20 mg total) by mouth daily. 12/21/23 03/20/24  Arfeen, Bronson Canny, MD  diclofenac  Sodium (VOLTAREN ) 1 % GEL Apply 4 g topically 4 (four) times daily. 02/27/24   Fleming, Zelda W, NP  ergocalciferol  (VITAMIN D2) 1.25 MG (50000 UT) capsule Take 1 capsule (50,000 Units total) by mouth once a week. 07/19/23   Ladd Picker, MD  lamoTRIgine  (LAMICTAL ) 200 MG tablet Take 1 tablet (200 mg total) by mouth daily. 12/21/23   Arfeen, Bronson Canny, MD  meloxicam  (MOBIC ) 7.5 MG tablet Take 1-2 tablets (7.5-15 mg total) by mouth daily. Start with one tablet. If not effective after a few days may take 2 tablets. 02/27/24   Fleming, Zelda W, NP  metFORMIN  (GLUCOPHAGE -XR) 500 MG 24 hr tablet Take 1 tablet (500 mg total) by mouth 2 (two) times daily with a meal. 01/25/24   Collins Dean, NP  methocarbamol  (ROBAXIN ) 500  MG tablet Take 1 tablet (500 mg total) by mouth 4 (four) times daily. 09/21/23   Fleming, Zelda W, NP  Semaglutide -Weight Management (WEGOVY ) 2.4 MG/0.75ML SOAJ Inject 2.4 mg into the skin once a week. 02/15/24   Rayburn, Evangelina Hilt, PA-C  temazepam  (RESTORIL ) 15 MG capsule Take 1 capsule (15 mg total) by mouth at bedtime as needed for sleep. 12/21/23   Arfeen, Bronson Canny, MD      Allergies    Patient has no known allergies.    Review of Systems   Review of Systems  All other systems reviewed and are negative.   Physical Exam Updated Vital Signs BP 129/79 (BP Location: Right Arm)   Pulse 77   Temp 98.2 F (36.8 C) (Oral)   Resp 18   SpO2 99%  Physical Exam Vitals and nursing note reviewed.  Constitutional:      General: She is not in acute distress. HENT:     Head: Normocephalic and atraumatic.  Eyes:     Conjunctiva/sclera: Conjunctivae normal.     Pupils: Pupils are equal, round, and reactive to light.  Cardiovascular:     Rate and Rhythm: Normal rate and regular rhythm.  Pulmonary:     Effort: Pulmonary effort is normal. No respiratory distress.  Abdominal:     General: There is no distension.     Tenderness: There is no guarding.  Musculoskeletal:  General: No deformity or signs of injury.     Cervical back: Neck supple.     Comments: Left index finger not held in flexion, no TTP over the proximal flexor tendon sheath, mild TTP about the distal fat pad of the finger, no vesicles  Skin:    Findings: No lesion or rash.  Neurological:     General: No focal deficit present.     Mental Status: She is alert. Mental status is at baseline.     ED Results / Procedures / Treatments   Labs (all labs ordered are listed, but only abnormal results are displayed) Labs Reviewed - No data to display  EKG None  Radiology No results found.  Procedures .Incision and Drainage  Date/Time: 03/07/2024 8:27 AM  Performed by: Rosealee Concha, MD Authorized by: Rosealee Concha, MD   Consent:    Consent obtained:  Verbal   Consent given by:  Patient   Risks discussed:  Bleeding, incomplete drainage and pain Universal protocol:    Patient identity confirmed:  Verbally with patient Location:    Type:  Abscess   Location:  Upper extremity   Upper extremity location:  Finger   Finger location:  L index finger Pre-procedure details:    Skin preparation:  Chlorhexidine  Anesthesia:    Anesthesia method:  Nerve block   Block needle gauge:  25 G   Block anesthetic:  Lidocaine  1% w/o epi   Block injection procedure:  Anatomic landmarks identified, anatomic landmarks palpated, introduced needle, negative aspiration for blood and incremental injection   Block outcome:  Incomplete block Procedure type:    Complexity:  Simple Procedure details:    Incision types:  Stab incision   Drainage:  Bloody   Drainage amount:  Scant   Packing materials:  1/4 in gauze Post-procedure details:    Procedure completion:  Tolerated     Medications Ordered in ED Medications  lidocaine  (PF) (XYLOCAINE ) 1 % injection 10 mL (has no administration in time range)    ED Course/ Medical Decision Making/ A&P                                 Medical Decision Making  48 year old female with medical history significant for HTN, bipolar disorder, ADHD, HLD who presents to the emergency department with a chief complaint of left index finger swelling.  The patient states that she normally gets ingrown nails.  She is right-handed.  She states that over the last 3 to 4 days she has had swelling and pain at the tip of her index finger.  No full on swelling of the entire digit. No fevers or chills.  On arrival, the patient was vitally stable.  On exam the patient had findings consistent with a developing paronychia with swelling and tenderness with erythema about the distal fat pad of the left index finger.  No Knievel signs to suggest flexor tenosynovitis.  Incision and drainage was  performed as per the procedure note above with minimal scant bloody drainage noted.  The patient's wound was then wrapped and she was placed on Keflex. She is on Meloxicam  chronically, will provide Norco for breakthrough pain control. Return precautions were provided, stable for discharge and outpatient follow-up.   Final Clinical Impression(s) / ED Diagnoses Final diagnoses:  Paronychia of finger of left hand    Rx / DC Orders ED Discharge Orders  Ordered    cephALEXin (KEFLEX) 500 MG capsule  4 times daily        03/07/24 0827    HYDROcodone -acetaminophen  (NORCO/VICODIN) 5-325 MG tablet  Every 6 hours PRN        03/07/24 0827              Rosealee Concha, MD 03/07/24 [phone]

## 2024-03-07 NOTE — ED Triage Notes (Signed)
 C/o left index finger swelling and pain x 3 days. Denies fevers or injury to area .

## 2024-03-14 ENCOUNTER — Encounter (INDEPENDENT_AMBULATORY_CARE_PROVIDER_SITE_OTHER): Payer: Self-pay | Admitting: Internal Medicine

## 2024-03-14 ENCOUNTER — Ambulatory Visit (INDEPENDENT_AMBULATORY_CARE_PROVIDER_SITE_OTHER): Admitting: Internal Medicine

## 2024-03-14 VITALS — BP 114/78 | HR 88 | Temp 98.0°F | Ht 69.0 in | Wt 207.0 lb

## 2024-03-14 DIAGNOSIS — E66812 Obesity, class 2: Secondary | ICD-10-CM

## 2024-03-14 DIAGNOSIS — E559 Vitamin D deficiency, unspecified: Secondary | ICD-10-CM | POA: Diagnosis not present

## 2024-03-14 DIAGNOSIS — Z6836 Body mass index (BMI) 36.0-36.9, adult: Secondary | ICD-10-CM | POA: Diagnosis not present

## 2024-03-14 DIAGNOSIS — E88819 Insulin resistance, unspecified: Secondary | ICD-10-CM

## 2024-03-14 DIAGNOSIS — K760 Fatty (change of) liver, not elsewhere classified: Secondary | ICD-10-CM

## 2024-03-14 MED ORDER — WEGOVY 2.4 MG/0.75ML ~~LOC~~ SOAJ: 2.4000 mg | SUBCUTANEOUS | 1 refills | Status: DC

## 2024-03-14 MED ORDER — MULTI-VITAMIN/MINERALS PO TABS: 1.0000 | ORAL_TABLET | Freq: Every day | ORAL | 1 refills | Status: AC

## 2024-03-14 MED ORDER — VITAMIN D3 50 MCG (2000 UT) PO CAPS: 2000.0000 [IU] | ORAL_CAPSULE | Freq: Every day | ORAL | 1 refills | Status: DC

## 2024-03-14 NOTE — Progress Notes (Signed)
 Office: [phone]  /  Fax: [phone]  Weight Summary And Biometrics  Vitals Temp: 98 F (36.7 C) BP: 114/78 Pulse Rate: 88 SpO2: 99 %   Anthropometric Measurements Height: 5\' 9"  (1.753 m) Weight: 207 lb (93.9 kg) BMI (Calculated): 30.55 Weight at Last Visit: 205 lb Weight Lost Since Last Visit: 0 lb Weight Gained Since Last Visit: 2 lb Starting Weight: 268 lb Total Weight Loss (lbs): 61 lb (27.7 kg) Peak Weight: 450 lb   Body Composition  Body Fat %: 39.2 % Fat Mass (lbs): 81.4 lbs Muscle Mass (lbs): 119.6 lbs Total Body Water (lbs): 81.4 lbs Visceral Fat Rating : 8    No data recorded Today's Visit #: 21  Starting Date: 08/28/22   Subjective   Chief Complaint: Obesity  Interval History  Sarah Salas is a 48 year old female with obesity and fatty liver who presents for medical weight management.  She has experienced a weight gain of ten pounds in May, attributed to stress eating due to personal and relationship stress. She turns to food for comfort during this period but is now taking steps to get back on track with her weight management. She is considering strategies such as walking and taking time to assess her cravings before eating.  She has a history of fatty liver and has been working on improving her liver health. Recent blood work from April shows that her liver enzymes are within normal limits. She has been monitoring her cholesterol levels, with her LDL cholesterol currently at 119, improved from a previous high of 161. She is actively trying to manage her cholesterol through dietary changes, including reading labels to avoid saturated fats and sugars.  Her boyfriend was recently diagnosed with type 2 diabetes, which has influenced her dietary habits as she often eats what he eats. Despite this, her own A1c is 5.2, and her insulin  levels are optimal.  She continues to take metformin  as prescribed by her primary care physician and is also on  Wegovy  for weight management. There was a brief period in May when she was unable to obtain Wegovy , but she has since resumed her regimen. She also takes vitamin D  supplements and plans to transition to an over-the-counter vitamin D3 regimen.   Challenges affecting patient progress: none.    Pharmacotherapy for weight management: She is currently taking Metformin  (off label use for incretin effect and / or insulin  resistance and / or diabetes prevention) with adequate clinical response  and without side effects. and Wegovy  with adequate clinical response  and without side effects..   Assessment and Plan   Treatment Plan For Obesity:  Recommended Dietary Goals  Dylanie is currently in the action stage of change. As such, her goal is to continue weight management plan. She has agreed to: continue current plan  Behavioral Health and Counseling  We discussed the following behavioral modification strategies today: continue to work on maintaining a reduced calorie state, getting the recommended amount of protein, incorporating whole foods, making healthy choices, staying well hydrated and practicing mindfulness when eating. and avoid all or none thinking.  Additional education and resources provided today: None  Recommended Physical Activity Goals  Cassandra has been advised to work up to 150 minutes of moderate intensity aerobic activity a week and strengthening exercises 2-3 times per week for cardiovascular health, weight loss maintenance and preservation of muscle mass.   She has agreed to :  Increase the intensity, frequency or duration of strengthening exercises  and Increase  the intensity, frequency or duration of aerobic exercises    Pharmacotherapy  We discussed various medication options to help Phelan with her weight loss efforts and we both agreed to : Adequate clinical response to anti-obesity medication, continue current regimen  Associated Conditions Impacted by Obesity  Treatment  Vitamin D  deficiency -     Multi-Vitamin/Minerals; Take 1 tablet by mouth daily.  Dispense: 90 tablet; Refill: 1 -     Vitamin D3; Take 1 capsule (2,000 Units total) by mouth daily.  Dispense: 90 capsule; Refill: 1  Insulin  resistance -     Wegovy ; Inject 2.4 mg into the skin once a week.  Dispense: 3 mL; Refill: 1  Class 2 severe obesity with serious comorbidity and body mass index (BMI) of 36.0 to 36.9 in adult, unspecified obesity type (HCC) -     Wegovy ; Inject 2.4 mg into the skin once a week.  Dispense: 3 mL; Refill: 1 -     Multi-Vitamin/Minerals; Take 1 tablet by mouth daily.  Dispense: 90 tablet; Refill: 1  Metabolic dysfunction-associated steatotic liver disease (MASLD) -     Wegovy ; Inject 2.4 mg into the skin once a week.  Dispense: 3 mL; Refill: 1     Obesity She has experienced stress eating, particularly in May, resulting in a weight gain of approximately ten pounds. Currently on Wegovy  for weight management, there was a brief period in May when she could not obtain it, but she is now back on track. She is encouraged to address stress eating by identifying cravings and engaging in alternative activities such as walking or drinking water. Emphasis on avoiding perfectionism and all-or-none thinking in her health approach. - Refill Wegovy  prescription and send to [facility] [address] - Encourage stress management techniques to address stress eating - Advise against striving for perfection and encourage a balanced approach to health - Schedule follow-up appointment in four weeks  Insulin  Resistance Her insulin  resistance has improved significantly. Her A1c is normal at 5.2, and her insulin  levels are optimal. She has been successful in reversing many of her medical conditions related to insulin  resistance.  Fatty Liver Her fatty liver condition has improved. Liver enzymes are within normal limits, indicating better liver function.  Hyperlipidemia Her LDL  cholesterol is mildly elevated at 119 mg/dL, but it has improved from previous levels as high as 161 mg/dL. She is advised to continue monitoring her diet, particularly saturated fats and sugars, to manage her cholesterol levels. Emphasis on reading food labels to monitor saturated fats and sugars. - Continue nutritional strategies to manage LDL cholesterol - Encourage reading food labels to monitor saturated fats and sugars  Vitamin D  Deficiency She is currently on a high-dose vitamin D  regimen. The plan is to transition her to an over-the-counter vitamin D3 supplement. - Transition to over-the-counter vitamin D3, 2000 units, one tablet daily - Start a multivitamin with minerals, such as One A Day, and send prescription to [facility] [address]    Objective   Physical Exam:  Blood pressure 114/78, pulse 88, temperature 98 F (36.7 C), height 5\' 9"  (1.753 m), weight 207 lb (93.9 kg), SpO2 99%. Body mass index is 30.57 kg/m.  General: She is overweight, cooperative, alert, well developed, and in no acute distress. PSYCH: Has normal mood, affect and thought process.   HEENT: EOMI, sclerae are anicteric. Lungs: Normal breathing effort, no conversational dyspnea. Extremities: No edema.  Neurologic: No gross sensory or motor deficits. No tremors or fasciculations noted.    Diagnostic  Data Reviewed:  BMET    Component Value Date/Time   NA 143 01/18/2024 0757   K 5.1 01/18/2024 0757   CL 104 01/18/2024 0757   CO2 23 01/18/2024 0757   GLUCOSE 83 01/18/2024 0757   GLUCOSE 93 07/19/2023 1155   BUN 10 01/18/2024 0757   CREATININE 0.77 01/18/2024 0757   CALCIUM 10.0 01/18/2024 0757   GFRNONAA >60 05/20/2022 0155   GFRAA 101 11/25/2020 0919   Lab Results  Component Value Date   HGBA1C 5.2 01/18/2024   HGBA1C 5.3 12/26/2019   Lab Results  Component Value Date   INSULIN  7.6 01/18/2024   INSULIN  13.6 11/04/2022   Lab Results  Component Value Date   TSH 1.250 01/13/2023   CBC     Component Value Date/Time   WBC 5.0 01/18/2024 0757   WBC 6.7 05/12/2022 0959   RBC 4.96 01/18/2024 0757   RBC 4.85 05/12/2022 0959   HGB 14.5 01/18/2024 0757   HCT 43.7 01/18/2024 0757   PLT 311 01/18/2024 0757   MCV 88 01/18/2024 0757   MCH 29.2 01/18/2024 0757   MCH 29.5 05/12/2022 0959   MCHC 33.2 01/18/2024 0757   MCHC 32.9 05/12/2022 0959   RDW 13.0 01/18/2024 0757   Iron Studies    Component Value Date/Time   IRON 68 11/18/2022 1117   TIBC 329 11/18/2022 1117   FERRITIN 62 11/18/2022 1117   IRONPCTSAT 21 11/18/2022 1117   Lipid Panel     Component Value Date/Time   CHOL 191 01/18/2024 0757   TRIG 106 01/18/2024 0757   HDL 53 01/18/2024 0757   CHOLHDL 4.3 01/13/2023 1344   LDLCALC 119 (H) 01/18/2024 0757   Hepatic Function Panel     Component Value Date/Time   PROT 6.9 01/18/2024 0757   ALBUMIN 4.7 01/18/2024 0757   AST 16 01/18/2024 0757   ALT 12 01/18/2024 0757   ALKPHOS 92 01/18/2024 0757   BILITOT 0.3 01/18/2024 0757   BILIDIR 0.0 11/02/2023 1024      Component Value Date/Time   TSH 1.250 01/13/2023 1344   Nutritional Lab Results  Component Value Date   VD25OH 49.8 04/19/2023   VD25OH 21.8 (L) 11/04/2022    Medications: Outpatient Encounter Medications as of 03/14/2024  Medication Sig   cephALEXin  (KEFLEX ) 500 MG capsule Take 1 capsule (500 mg total) by mouth 4 (four) times daily.   Cholecalciferol (VITAMIN D3) 50 MCG (2000 UT) capsule Take 1 capsule (2,000 Units total) by mouth daily.   citalopram  (CELEXA ) 20 MG tablet Take 1 tablet (20 mg total) by mouth daily.   diclofenac  Sodium (VOLTAREN ) 1 % GEL Apply 4 g topically 4 (four) times daily.   ergocalciferol  (VITAMIN D2) 1.25 MG (50000 UT) capsule Take 1 capsule (50,000 Units total) by mouth once a week.   lamoTRIgine  (LAMICTAL ) 200 MG tablet Take 1 tablet (200 mg total) by mouth daily.   metFORMIN  (GLUCOPHAGE -XR) 500 MG 24 hr tablet Take 1 tablet (500 mg total) by mouth 2 (two) times daily  with a meal.   Multiple Vitamins-Minerals (MULTIVITAMIN WITH MINERALS) tablet Take 1 tablet by mouth daily.   temazepam  (RESTORIL ) 15 MG capsule Take 1 capsule (15 mg total) by mouth at bedtime as needed for sleep.   [DISCONTINUED] HYDROcodone -acetaminophen  (NORCO/VICODIN) 5-325 MG tablet Take 1-2 tablets by mouth every 6 (six) hours as needed.   [DISCONTINUED] meloxicam  (MOBIC ) 7.5 MG tablet Take 1-2 tablets (7.5-15 mg total) by mouth daily. Start with one tablet. If not effective  after a few days may take 2 tablets.   [DISCONTINUED] methocarbamol  (ROBAXIN ) 500 MG tablet Take 1 tablet (500 mg total) by mouth 4 (four) times daily.   [DISCONTINUED] Semaglutide -Weight Management (WEGOVY ) 2.4 MG/0.75ML SOAJ Inject 2.4 mg into the skin once a week.   Semaglutide -Weight Management (WEGOVY ) 2.4 MG/0.75ML SOAJ Inject 2.4 mg into the skin once a week.   No facility-administered encounter medications on file as of 03/14/2024.     Follow-Up   Return in about 4 weeks (around 04/11/2024) for Schedule future 3-4 week follow-up.Aaron Aas She was informed of the importance of frequent follow up visits to maximize her success with intensive lifestyle modifications for her multiple health conditions.  Attestation Statement   Reviewed by clinician on day of visit: allergies, medications, problem list, medical history, surgical history, family history, social history, and previous encounter notes.     Ladd Picker, MD

## 2024-03-21 ENCOUNTER — Telehealth (HOSPITAL_BASED_OUTPATIENT_CLINIC_OR_DEPARTMENT_OTHER): Admitting: Psychiatry

## 2024-03-21 ENCOUNTER — Encounter (HOSPITAL_COMMUNITY): Payer: Self-pay | Admitting: Psychiatry

## 2024-03-21 DIAGNOSIS — F419 Anxiety disorder, unspecified: Secondary | ICD-10-CM

## 2024-03-21 DIAGNOSIS — F319 Bipolar disorder, unspecified: Secondary | ICD-10-CM

## 2024-03-21 DIAGNOSIS — F902 Attention-deficit hyperactivity disorder, combined type: Secondary | ICD-10-CM

## 2024-03-21 MED ORDER — LAMOTRIGINE 200 MG PO TABS
200.0000 mg | ORAL_TABLET | Freq: Every day | ORAL | 2 refills | Status: DC
Start: 2024-03-21 — End: 2024-05-12

## 2024-03-21 MED ORDER — CITALOPRAM HYDROBROMIDE 20 MG PO TABS: 20.0000 mg | ORAL_TABLET | Freq: Every day | ORAL | 2 refills | Status: DC

## 2024-03-21 MED ORDER — TEMAZEPAM 15 MG PO CAPS: 15.0000 mg | ORAL_CAPSULE | Freq: Every evening | ORAL | 2 refills | Status: DC | PRN

## 2024-03-21 NOTE — Progress Notes (Signed)
 [facility] MD/PA/NP OP Progress Note  Virtual Visit via Video Note  I connected with Sarah Salas on 03/21/24 at  8:20 AM EDT by a video enabled telemedicine application and verified that I am speaking with the correct person using two identifiers.  Location: Patient: Home Provider: Home Office   I discussed the limitations of evaluation and management by telemedicine and the availability of in person appointments. The patient expressed understanding and agreed to proceed.  03/21/2024 8:29 AM Sarah Salas  MRN:  [id number]  Chief Complaint:  Chief Complaint  Patient presents with   Follow-up   HPI: Patient was evaluated by video session.  She reported things are going very well.  She has a much better relationship with her mother and she spending time with her mother a lot and really enjoys having conversations with her.  She also reported sleep is very good.  Temazepam  keeping her sleep all night.  Patient told she is with her boyfriend for past 4 years and she is hoping 1 day she will get married but gradually relationship is getting stronger and stronger.  She has a plan to visit her boyfriend's father who live out of her halfway in few weeks.  She excited about the trip.  She denies any mania, psychosis, hallucination or any paranoia.  She does not get irritable or anxious.  She is very pleased that weight loss program going very well and she lost another 10 pounds since last visit.  She has no tremors, shakes or any EPS.  She recently had blood work and her liver enzymes are normal.  Her hemoglobin A1c also normal.  She sometimes struggle with attention and concentration but symptoms are manageable.  She has no rash or any itching.  She is very pleased that she is living with her mother and able to help her for everything that she can do to keep her mother comfortable.  Patient does not want to change the medication.  Visit Diagnosis:    ICD-10-CM   1. Anxiety  F41.9 citalopram  (CELEXA ) 20 MG  tablet    lamoTRIgine  (LAMICTAL ) 200 MG tablet    temazepam  (RESTORIL ) 15 MG capsule    2. Attention deficit hyperactivity disorder (ADHD), combined type  F90.2 citalopram  (CELEXA ) 20 MG tablet    lamoTRIgine  (LAMICTAL ) 200 MG tablet    3. Bipolar I disorder (HCC)  F31.9 lamoTRIgine  (LAMICTAL ) 200 MG tablet    temazepam  (RESTORIL ) 15 MG capsule       Past Psychiatric History: Reviewed H/O ADHD, bipolar disorder. Vyvanse , Strattera , Hydroxyzine  and Trazodone  did not help.  Adderall helped.  Latuda  helped in the beginning but then got worse with increased dose.  No h/o paranoia, inpatient treatment or suicidal attempt. H/O TBI.  Gabapentin  and Abilify  helped but caused increased weight gain, liver enzyme and blood sugar.    Past Medical History:  Past Medical History:  Diagnosis Date   ADHD (attention deficit hyperactivity disorder)    Allergy    Normal allergies   Anxiety    Arthritis    Bipolar 1 disorder (HCC)    Brain injury (HCC) 2005   Glaucoma    Hyperlipidemia    Hypertension    no longer treated    Past Surgical History:  Procedure Laterality Date   BRAIN SURGERY  10/28/2003   from [facility]   FRACTURE SURGERY  10/28/2003   KNEE ARTHROSCOPY WITH MENISCAL REPAIR Right 05/19/2022   Procedure: RIGHT KNEE ARTHROSCOPY, MEDIAL MENISCAL DEBRIDEMENT AND ABRASION ARTHROPLASTY;  Surgeon:  Wilhelmenia Harada, MD;  Location: [facility] OR;  Service: Orthopedics;  Laterality: Right;   TIBIA OSTEOTOMY Right 05/19/2022   Procedure: RIGHT KNEE HIGH TIBIAL OSTEOTOMY;  Surgeon: Wilhelmenia Harada, MD;  Location: [facility] OR;  Service: Orthopedics;  Laterality: Right;   WISDOM TOOTH EXTRACTION      Family Psychiatric History: Reviewed  Family History:  Family History  Problem Relation Age of Onset   Bipolar disorder Mother    Hypertension Mother    Miscarriages / [place] Mother    Vision loss Mother    Varicose Veins Mother    High Cholesterol Mother    Anxiety disorder Mother    Obesity Mother     Alcohol abuse Father    Heart disease Father    Learning disabilities Father    High blood pressure Father    High Cholesterol Father    Sudden death Father    Obesity Father    Bipolar disorder Sister    Learning disabilities Sister    ADD / ADHD Sister    Bipolar disorder Maternal Grandmother    Mental illness Maternal Grandmother    Hypertension Maternal Grandfather    Cancer Paternal Grandmother    Cancer Paternal Grandfather    Diabetes Paternal Aunt    Colon cancer Neg Hx    Colon polyps Neg Hx    Esophageal cancer Neg Hx    Rectal cancer Neg Hx    Stomach cancer Neg Hx     Social History:  Social History   Socioeconomic History   Marital status: Single    Spouse name: Not on file   Number of children: 0   Years of education: Not on file   Highest education level: Bachelor's degree (e.g., BA, AB, BS)  Occupational History   Occupation: takes care of parents  Tobacco Use   Smoking status: Former    Current packs/day: 0.00    Types: Cigarettes    Start date: 06/13/2001    Quit date: 06/13/2021    Years since quitting: 2.7   Smokeless tobacco: Never   Tobacco comments:    Use to smoke, and vape stopped  Vaping Use   Vaping status: Every Day   Substances: Flavoring  Substance and Sexual Activity   Alcohol use: No   Drug use: No   Sexual activity: Not Currently    Birth control/protection: I.U.D.  Other Topics Concern   Not on file  Social History Narrative   Not on file   Social Drivers of Health   Financial Resource Strain: Low Risk  (01/22/2024)   Overall Financial Resource Strain (CARDIA)    Difficulty of Paying Living Expenses: Not very hard  Food Insecurity: Food Insecurity Present (01/22/2024)   Hunger Vital Sign    Worried About Running Out of Food in the Last Year: Never true    Ran Out of Food in the Last Year: Sometimes true  Transportation Needs: No Transportation Needs (01/22/2024)   PRAPARE - Administrator, Civil Service  (Medical): No    Lack of Transportation (Non-Medical): No  Physical Activity: Sufficiently Active (01/22/2024)   Exercise Vital Sign    Days of Exercise per Week: 5 days    Minutes of Exercise per Session: 90 min  Stress: No Stress Concern Present (01/22/2024)   [employer] of Occupational Health - Occupational Stress Questionnaire    Feeling of Stress : Not at all  Social Connections: Socially Isolated (01/22/2024)   Social Connection and Isolation Panel [NHANES]  Frequency of Communication with Friends and Family: More than three times a week    Frequency of Social Gatherings with Friends and Family: Once a week    Attends Religious Services: Never    Database administrator or Organizations: No    Attends Engineer, structural: Not on file    Marital Status: Never married    Allergies: No Known Allergies  Metabolic Disorder Labs: Lab Results  Component Value Date   HGBA1C 5.2 01/18/2024   No results found for: "PROLACTIN" Lab Results  Component Value Date   CHOL 191 01/18/2024   TRIG 106 01/18/2024   HDL 53 01/18/2024   CHOLHDL 4.3 01/13/2023   LDLCALC 119 (H) 01/18/2024   LDLCALC 129 (H) 01/13/2023   Lab Results  Component Value Date   TSH 1.250 01/13/2023   TSH 1.200 11/04/2022    Therapeutic Level Labs: No results found for: "LITHIUM" No results found for: "VALPROATE" No results found for: "CBMZ"  Current Medications: Current Outpatient Medications  Medication Sig Dispense Refill   cephALEXin  (KEFLEX ) 500 MG capsule Take 1 capsule (500 mg total) by mouth 4 (four) times daily. 20 capsule 0   Cholecalciferol (VITAMIN D3) 50 MCG (2000 UT) capsule Take 1 capsule (2,000 Units total) by mouth daily. 90 capsule 1   citalopram  (CELEXA ) 20 MG tablet Take 1 tablet (20 mg total) by mouth daily. 30 tablet 2   diclofenac  Sodium (VOLTAREN ) 1 % GEL Apply 4 g topically 4 (four) times daily. 50 g 3   ergocalciferol  (VITAMIN D2) 1.25 MG (50000 UT) capsule Take 1  capsule (50,000 Units total) by mouth once a week. 12 capsule 0   lamoTRIgine  (LAMICTAL ) 200 MG tablet Take 1 tablet (200 mg total) by mouth daily. 30 tablet 2   metFORMIN  (GLUCOPHAGE -XR) 500 MG 24 hr tablet Take 1 tablet (500 mg total) by mouth 2 (two) times daily with a meal. 180 tablet 1   Multiple Vitamins-Minerals (MULTIVITAMIN WITH MINERALS) tablet Take 1 tablet by mouth daily. 90 tablet 1   Semaglutide -Weight Management (WEGOVY ) 2.4 MG/0.75ML SOAJ Inject 2.4 mg into the skin once a week. 3 mL 1   temazepam  (RESTORIL ) 15 MG capsule Take 1 capsule (15 mg total) by mouth at bedtime as needed for sleep. 30 capsule 2   No current facility-administered medications for this visit.     Musculoskeletal: Strength & Muscle Tone: within normal limits Gait & Station: normal Patient leans: N/A  Psychiatric Specialty Exam: Review of Systems  Weight 207 lb (93.9 kg).There is no height or weight on file to calculate BMI.  General Appearance: Casual  Eye Contact:  Good  Speech:  fast  Volume:  Normal  Mood:  pleasant   Affect:  Congruent  Thought Process:  Descriptions of Associations: Circumstantial  Orientation:  Full (Time, Place, and Person)  Thought Content: Logical   Suicidal Thoughts:  No  Homicidal Thoughts:  No  Memory:  Immediate;   Good Recent;   Good Remote;   Good  Judgement:  Intact  Insight:  Present  Psychomotor Activity:  Increased  Concentration:  Concentration: Fair and Attention Span: Fair  Recall:  Good  Fund of Knowledge: Good  Language: Good  Akathisia:  No  Handed:  Right  AIMS (if indicated): not done  Assets:  Communication Skills Desire for Improvement Housing Resilience Social Support Transportation  ADL's:  Intact  Cognition: WNL  Sleep:  Good   Screenings: GAD-7    Garment/textile technologist Visit from  01/25/2024 in [facility] [place] - A Dept Of Tommas Fragmin. [facility] Office Visit from 01/13/2023 in [facility] - A Dept Of Tommas Fragmin. [facility] Office Visit from 11/22/2018 in [facility] [place] - A Dept Of Tommas Fragmin. [facility] Office Visit from 08/16/2018 in [facility] [place] - A Dept Of [place]. [facility] Office Visit from 05/18/2018 in [facility] [place] - A Dept Of Tommas Fragmin. [facility]  Total GAD-7 Score 0 0 0 0 0      PHQ2-9    Flowsheet Row Office Visit from 01/25/2024 in [facility] [place] - A Dept Of [place]. [facility] Office Visit from 01/13/2023 in [facility] - A Dept Of Tommas Fragmin. [facility] Office Visit from 11/22/2018 in [facility] [place] - A Dept Of Tommas Fragmin. [facility] Office Visit from 08/16/2018 in [facility] [place] - A Dept Of [place]. [facility] Office Visit from 05/18/2018 in [facility] [place] - A Dept Of Tommas Fragmin. [facility]  PHQ-2 Total Score 0 0 0 0 0  PHQ-9 Total Score 0 0 0 0 0      Flowsheet Row ED from 03/07/2024 in [facility] at [facility] Admission (Discharged) from 05/19/2022 in [facility] Video Visit from 09/15/2021 [facility]  C-SSRS RISK CATEGORY No Risk No Risk No Risk        Assessment and Plan: Patient is 48 year old female with history of ADHD, panic attack and bipolar disorder is doing very well on current medication.  She still have little symptoms of ADHD but symptoms are manageable.  Reviewed blood work results.  Labs are normal.  She has no rash or any itching.  She like to keep the current medication as mood symptoms are under control.  Continue temazepam  15 mg at bedtime, lamotrigine  200 mg daily and Celexa  20 mg daily.  Recommend to call us  back if she has any question or any concern.  Follow-up in 3 months.  Collaboration of Care:  Collaboration of Care: Other provider involved in patient's care AEB notes are available in epic to review  Patient/Guardian was advised Release of Information must be obtained prior to any record release in order to collaborate their care with an outside provider. Patient/Guardian was advised if they have not already done so to contact the registration department to sign all necessary forms in order for us  to release information regarding their care.   Consent: Patient/Guardian gives verbal consent for treatment and assignment of benefits for services provided during this visit. Patient/Guardian expressed understanding and agreed to proceed.    Follow Up Instructions:    I discussed the assessment and treatment plan with the patient. The patient was provided an opportunity to ask questions and all were answered. The patient agreed with the plan and demonstrated an understanding of the instructions.   The patient was advised to call back or seek an in-person evaluation if the symptoms worsen or if the condition fails to improve as anticipated.  I provided 19 minutes of non-face-to-face time during this encounter.   Arturo Late, MD 03/21/2024, 8:29 AM

## 2024-04-11 ENCOUNTER — Encounter (INDEPENDENT_AMBULATORY_CARE_PROVIDER_SITE_OTHER): Payer: Self-pay | Admitting: Internal Medicine

## 2024-04-11 ENCOUNTER — Ambulatory Visit (INDEPENDENT_AMBULATORY_CARE_PROVIDER_SITE_OTHER): Admitting: Internal Medicine

## 2024-04-11 VITALS — BP 117/80 | HR 85 | Temp 97.9°F | Ht 69.0 in | Wt 209.0 lb

## 2024-04-11 DIAGNOSIS — K760 Fatty (change of) liver, not elsewhere classified: Secondary | ICD-10-CM | POA: Diagnosis not present

## 2024-04-11 DIAGNOSIS — E66812 Obesity, class 2: Secondary | ICD-10-CM | POA: Diagnosis not present

## 2024-04-11 DIAGNOSIS — E7800 Pure hypercholesterolemia, unspecified: Secondary | ICD-10-CM | POA: Diagnosis not present

## 2024-04-11 DIAGNOSIS — R638 Other symptoms and signs concerning food and fluid intake: Secondary | ICD-10-CM | POA: Diagnosis not present

## 2024-04-11 DIAGNOSIS — Z6836 Body mass index (BMI) 36.0-36.9, adult: Secondary | ICD-10-CM | POA: Diagnosis not present

## 2024-04-11 NOTE — Assessment & Plan Note (Signed)
 Improved on incretin therapy. She has increased orexigenic signaling, impaired satiety and inhibitory control. This is secondary to an abnormal energy regulation system and pathological neurohormonal pathways characteristic of excess adiposity.  In addition to nutritional and behavioral strategies she benefits from ongoing pharmacotherapy.  Continue Wegovy  at 2.4 mg once a week

## 2024-04-11 NOTE — Assessment & Plan Note (Signed)
 She is actively engaged in a weight management program. Despite a two-pound gain since the last visit, she adheres to a 1200 calorie diet, tracks intake, and maintains 450 minutes of exercise weekly.  - Continue 450 minutes of exercise weekly. - Maintain a 1200 calorie diet focusing on balanced meals with more protein and vegetables. - Use PB2 powder as a lower-calorie alternative to regular peanut butter. - Monitor carbohydrate intake to manage insulin  levels and promote fat burning. - Utilize the [employer] app to assess food quality and avoid highly processed foods. - Consider meals with low glycemic index foods. Patient has lost 54% of total body weight from her peak weight of 450 and 23% of starting weight of 268.    She will continue current weight management strategy inclusive of GLP-1 therapy with Wegovy  2.4 mg once a week

## 2024-04-11 NOTE — Progress Notes (Signed)
 Office: [phone]  /  Fax: [phone]  Weight Summary and Body Composition Analysis (BIA)  Vitals Temp: 97.9 F (36.6 C) BP: 117/80 Pulse Rate: 85 SpO2: 100 %   Anthropometric Measurements Height: 5' 9 (1.753 m) Weight: 209 lb (94.8 kg) BMI (Calculated): 30.85 Weight at Last Visit: 207 lb Weight Lost Since Last Visit: 0 lb Weight Gained Since Last Visit: 2 lb Starting Weight: 268 lb Total Weight Loss (lbs): 59 lb (26.8 kg) Peak Weight: 450 lb   Body Composition  Body Fat %: 39.2 % Fat Mass (lbs): 82 lbs Muscle Mass (lbs): 120.8 lbs Total Body Water (lbs): 82.6 lbs Visceral Fat Rating : 9    RMR: 2102  Today's Visit #: 22  Starting Date: 08/28/22   Subjective   Chief Complaint: Obesity  Interval History Discussed the use of AI scribe software for clinical note transcription with the patient, who gave verbal consent to proceed.  History of Present Illness   Sarah Salas is a 48 year old female who presents for medical weight management.  She has gained two pounds since her last visit despite adhering to a 1200 calorie diet, focusing on whole foods, adequate protein intake, and maintaining hydration. She does not skip meals and is tracking her intake.  She exercises five days a week for about 90 minutes, incorporating strength training, cardio, and yoga. She reports adequate sleep and no high levels of stress.  She has a fondness for peanut butter, which contributes to her calorie intake.  She prefers whole wheat pasta over white pasta due to her boyfriend's diabetes diagnosis.  She is concerned about gaining muscle mass and excess skin due to weight loss, noting changes in her legs and abdomen. She is actively lifting weights to maintain muscle mass.       Challenges affecting patient progress: Weight centric mindset.    Pharmacotherapy for weight management: She is currently taking Wegovy  with adequate clinical response  and without side  effects..   Assessment and Plan   Treatment Plan For Obesity:  Recommended Dietary Goals  [name] is currently in the action stage of change. As such, her goal is to continue weight management plan. She has agreed to: continue current plan  Behavioral Health and Counseling  We discussed the following behavioral modification strategies today: practice mindfulness eating and understand the difference between hunger signals and cravings, avoiding temptations and identifying enticing environmental cues, and continue to practice mindfulness when eating.  Additional education and resources provided today: None  Recommended Physical Activity Goals  [name] has been advised to work up to 150 minutes of moderate intensity aerobic activity a week and strengthening exercises 2-3 times per week for cardiovascular health, weight loss maintenance and preservation of muscle mass.   She has agreed to :  Continue current level of physical activity   Medical Interventions and Pharmacotherapy  We discussed various medication options to help [name] with her weight loss efforts and we both agreed to : Adequate clinical response to anti-obesity medication, continue current regimen  Associated Conditions Impacted by Obesity Treatment  Assessment & Plan Metabolic dysfunction-associated steatotic liver disease (MASLD) Stable.  No reports of easy bruising.  CBC, CMP PT and INR normal.    Fibrosis 4 Score = .68 (Low risk)      Interpretation for patients with NAFLD          <1.30       -  F0-F1 (Low risk)  1.30-2.67 -  Indeterminate           >2.67      -  F3-F4 (High risk)     Validated for ages 45-65    She has lost approximately 23% of total body weight since starting program and is also on GLP-1 so I suspect her condition has improved and is stable. Class 2 severe obesity with serious comorbidity and body mass index (BMI) of 36.0 to 36.9 in adult, unspecified obesity type (HCC) She is actively  engaged in a weight management program. Despite a two-pound gain since the last visit, she adheres to a 1200 calorie diet, tracks intake, and maintains 450 minutes of exercise weekly.  - Continue 450 minutes of exercise weekly. - Maintain a 1200 calorie diet focusing on balanced meals with more protein and vegetables. - Use PB2 powder as a lower-calorie alternative to regular peanut butter. - Monitor carbohydrate intake to manage insulin  levels and promote fat burning. - Utilize the [employer] app to assess food quality and avoid highly processed foods. - Consider meals with low glycemic index foods. Patient has lost 54% of total body weight from her peak weight of 450 and 23% of starting weight of 268.    She will continue current weight management strategy inclusive of GLP-1 therapy with Wegovy  2.4 mg once a week Abnormal food appetite Improved on incretin therapy. She has increased orexigenic signaling, impaired satiety and inhibitory control. This is secondary to an abnormal energy regulation system and pathological neurohormonal pathways characteristic of excess adiposity.  In addition to nutritional and behavioral strategies she benefits from ongoing pharmacotherapy.  Continue Wegovy  at 2.4 mg once a week  Pure hypercholesterolemia Stable currently not on lipid-lowering treatment due to low cardiovascular risk.  Rechecking fasting lipid profile today for reevaluation   Her 10 year risk is: The 10-year ASCVD risk score (Arnett DK, et al., 2019) is: 0.7%  Lab Results  Component Value Date   CHOL 191 01/18/2024   HDL 53 01/18/2024   LDLCALC 119 (H) 01/18/2024   TRIG 106 01/18/2024   CHOLHDL 4.3 01/13/2023    Continue weight loss therapy, losing 10% or more of body weight may improve condition. Also advised to reduce saturated fats in diet to less than 10% of daily calories.          Objective   Physical Exam:  Blood pressure 117/80, pulse 85, temperature 97.9 F (36.6 C), height 5'  9 (1.753 m), weight 209 lb (94.8 kg), SpO2 100%. Body mass index is 30.86 kg/m.  General: She is overweight, cooperative, alert, well developed, and in no acute distress. PSYCH: Has normal mood, affect and thought process.   HEENT: EOMI, sclerae are anicteric. Lungs: Normal breathing effort, no conversational dyspnea. Extremities: No edema.  Neurologic: No gross sensory or motor deficits. No tremors or fasciculations noted.    Diagnostic Data Reviewed:  BMET    Component Value Date/Time   NA 143 01/18/2024 0757   K 5.1 01/18/2024 0757   CL 104 01/18/2024 0757   CO2 23 01/18/2024 0757   GLUCOSE 83 01/18/2024 0757   GLUCOSE 93 07/19/2023 1155   BUN 10 01/18/2024 0757   CREATININE 0.77 01/18/2024 0757   CALCIUM 10.0 01/18/2024 0757   GFRNONAA >60 05/20/2022 0155   GFRAA 101 11/25/2020 0919   Lab Results  Component Value Date   HGBA1C 5.2 01/18/2024   HGBA1C 5.3 12/26/2019   Lab Results  Component Value Date   INSULIN  7.6 01/18/2024  INSULIN  13.6 11/04/2022   Lab Results  Component Value Date   TSH 1.250 01/13/2023   CBC    Component Value Date/Time   WBC 5.0 01/18/2024 0757   WBC 6.7 05/12/2022 0959   RBC 4.96 01/18/2024 0757   RBC 4.85 05/12/2022 0959   HGB 14.5 01/18/2024 0757   HCT 43.7 01/18/2024 0757   PLT 311 01/18/2024 0757   MCV 88 01/18/2024 0757   MCH 29.2 01/18/2024 0757   MCH 29.5 05/12/2022 0959   MCHC 33.2 01/18/2024 0757   MCHC 32.9 05/12/2022 0959   RDW 13.0 01/18/2024 0757   Iron Studies    Component Value Date/Time   IRON 68 11/18/2022 1117   TIBC 329 11/18/2022 1117   FERRITIN 62 11/18/2022 1117   IRONPCTSAT 21 11/18/2022 1117   Lipid Panel     Component Value Date/Time   CHOL 191 01/18/2024 0757   TRIG 106 01/18/2024 0757   HDL 53 01/18/2024 0757   CHOLHDL 4.3 01/13/2023 1344   LDLCALC 119 (H) 01/18/2024 0757   Hepatic Function Panel     Component Value Date/Time   PROT 6.9 01/18/2024 0757   ALBUMIN 4.7 01/18/2024 0757    AST 16 01/18/2024 0757   ALT 12 01/18/2024 0757   ALKPHOS 92 01/18/2024 0757   BILITOT 0.3 01/18/2024 0757   BILIDIR 0.0 11/02/2023 1024      Component Value Date/Time   TSH 1.250 01/13/2023 1344   Nutritional Lab Results  Component Value Date   VD25OH 49.8 04/19/2023   VD25OH 21.8 (L) 11/04/2022    Medications: Outpatient Encounter Medications as of 04/11/2024  Medication Sig   cephALEXin  (KEFLEX ) 500 MG capsule Take 1 capsule (500 mg total) by mouth 4 (four) times daily.   Cholecalciferol (VITAMIN D3) 50 MCG (2000 UT) capsule Take 1 capsule (2,000 Units total) by mouth daily.   citalopram  (CELEXA ) 20 MG tablet Take 1 tablet (20 mg total) by mouth daily.   diclofenac  Sodium (VOLTAREN ) 1 % GEL Apply 4 g topically 4 (four) times daily.   ergocalciferol  (VITAMIN D2) 1.25 MG (50000 UT) capsule Take 1 capsule (50,000 Units total) by mouth once a week.   lamoTRIgine  (LAMICTAL ) 200 MG tablet Take 1 tablet (200 mg total) by mouth daily.   metFORMIN  (GLUCOPHAGE -XR) 500 MG 24 hr tablet Take 1 tablet (500 mg total) by mouth 2 (two) times daily with a meal.   Multiple Vitamins-Minerals (MULTIVITAMIN WITH MINERALS) tablet Take 1 tablet by mouth daily.   Semaglutide -Weight Management (WEGOVY ) 2.4 MG/0.75ML SOAJ Inject 2.4 mg into the skin once a week.   temazepam  (RESTORIL ) 15 MG capsule Take 1 capsule (15 mg total) by mouth at bedtime as needed for sleep.   No facility-administered encounter medications on file as of 04/11/2024.     Follow-Up   Return in about 4 weeks (around 05/09/2024) for For Weight Mangement with Dr. Francyne.SABRA She was informed of the importance of frequent follow up visits to maximize her success with intensive lifestyle modifications for her multiple health conditions.  Attestation Statement   Reviewed by clinician on day of visit: allergies, medications, problem list, medical history, surgical history, family history, social history, and previous encounter notes.      Lucas Francyne, MD

## 2024-04-11 NOTE — Assessment & Plan Note (Signed)
 Stable currently not on lipid-lowering treatment due to low cardiovascular risk.  Rechecking fasting lipid profile today for reevaluation

## 2024-04-11 NOTE — Assessment & Plan Note (Signed)
 Stable.  No reports of easy bruising.  CBC, CMP PT and INR normal.    Fibrosis 4 Score = .68 (Low risk)      Interpretation for patients with NAFLD          <1.30       -  F0-F1 (Low risk)          1.30-2.67 -  Indeterminate           >2.67      -  F3-F4 (High risk)     Validated for ages 30-65    She has lost approximately 23% of total body weight since starting program and is also on GLP-1 so I suspect her condition has improved and is stable.

## 2024-05-12 ENCOUNTER — Telehealth (HOSPITAL_COMMUNITY): Admitting: Psychiatry

## 2024-05-12 ENCOUNTER — Encounter (HOSPITAL_COMMUNITY): Payer: Self-pay | Admitting: Psychiatry

## 2024-05-12 VITALS — Wt 209.0 lb

## 2024-05-12 DIAGNOSIS — F419 Anxiety disorder, unspecified: Secondary | ICD-10-CM | POA: Diagnosis not present

## 2024-05-12 DIAGNOSIS — F902 Attention-deficit hyperactivity disorder, combined type: Secondary | ICD-10-CM | POA: Diagnosis not present

## 2024-05-12 DIAGNOSIS — F319 Bipolar disorder, unspecified: Secondary | ICD-10-CM

## 2024-05-12 MED ORDER — TEMAZEPAM 15 MG PO CAPS: 15.0000 mg | ORAL_CAPSULE | Freq: Every evening | ORAL | 2 refills | Status: DC | PRN

## 2024-05-12 MED ORDER — LAMOTRIGINE 200 MG PO TABS
200.0000 mg | ORAL_TABLET | Freq: Every day | ORAL | 2 refills | Status: DC
Start: 2024-05-12 — End: 2024-07-31

## 2024-05-12 MED ORDER — CITALOPRAM HYDROBROMIDE 20 MG PO TABS: 20.0000 mg | ORAL_TABLET | Freq: Every day | ORAL | 2 refills | Status: DC

## 2024-05-12 MED ORDER — HYDROXYZINE PAMOATE 25 MG PO CAPS: 25.0000 mg | ORAL_CAPSULE | Freq: Two times a day (BID) | ORAL | 1 refills | Status: DC | PRN

## 2024-05-12 NOTE — Progress Notes (Signed)
 [facility] MD Virtual Progress Note   Patient Location: Home Provider Location: Home Office  I connect with patient by video and verified that I am speaking with correct person by using two identifiers. I discussed the limitations of evaluation and management by telemedicine and the availability of in person appointments. I also discussed with the patient that there may be a patient responsible charge related to this service. The patient expressed understanding and agreed to proceed.  Sarah Salas [id number] 48 y.o.  05/12/2024 9:11 AM  History of Present Illness:  Patient is evaluated by video session.  She requested an earlier appointment because she like something to calm herself.  She noticed a rush to everything and last Saturday she brought her and after she dropped the coffee.  She is feeling better and the skin is getting better and heal but wondering if she can take something to help her anxiety.  She reported all her life she is like that that she rushed to do things.  She gets distracted and unable to focus.  She has ADHD and in the past we had tried ADHD medication but lower enzymes increase.  Patient does not want any medication that causes weight gain.  She feels otherwise fine.  She sleeps good with the help of temazepam .  She denies any impulsive behavior, suicidal thoughts or homicidal thoughts.  She reported relationship with the boyfriend is getting stronger.  She has plan to visit few places with the boyfriend.  She has no tremor or shakes.  Her hemoglobin A1c and liver enzymes are normal.  Past Psychiatric History: H/O ADHD, bipolar disorder. Vyvanse , Strattera , Hydroxyzine  and Trazodone  did not help.  Adderall helped.  Latuda  helped in the beginning but then got worse with increased dose.  No h/o paranoia, inpatient treatment or suicidal attempt. H/O TBI.  Gabapentin  and Abilify  helped but caused increased weight gain, liver enzyme and blood sugar.    Past  Medical History:  Diagnosis Date   ADHD (attention deficit hyperactivity disorder)    Allergy    Normal allergies   Anxiety    Arthritis    Bipolar 1 disorder (HCC)    Brain injury (HCC) 2005   Glaucoma    Hyperlipidemia    Hypertension    no longer treated    Outpatient Encounter Medications as of 05/12/2024  Medication Sig   hydrOXYzine  (VISTARIL ) 25 MG capsule Take 1 capsule (25 mg total) by mouth 2 (two) times daily as needed for anxiety.   Cholecalciferol (VITAMIN D3) 50 MCG (2000 UT) capsule Take 1 capsule (2,000 Units total) by mouth daily.   citalopram  (CELEXA ) 20 MG tablet Take 1 tablet (20 mg total) by mouth daily.   diclofenac  Sodium (VOLTAREN ) 1 % GEL Apply 4 g topically 4 (four) times daily.   ergocalciferol  (VITAMIN D2) 1.25 MG (50000 UT) capsule Take 1 capsule (50,000 Units total) by mouth once a week.   lamoTRIgine  (LAMICTAL ) 200 MG tablet Take 1 tablet (200 mg total) by mouth daily.   metFORMIN  (GLUCOPHAGE -XR) 500 MG 24 hr tablet Take 1 tablet (500 mg total) by mouth 2 (two) times daily with a meal.   Multiple Vitamins-Minerals (MULTIVITAMIN WITH MINERALS) tablet Take 1 tablet by mouth daily.   Semaglutide -Weight Management (WEGOVY ) 2.4 MG/0.75ML SOAJ Inject 2.4 mg into the skin once a week.   temazepam  (RESTORIL ) 15 MG capsule Take 1 capsule (15 mg total) by mouth at bedtime as needed for sleep.   [DISCONTINUED] citalopram  (CELEXA ) 20 MG tablet  Take 1 tablet (20 mg total) by mouth daily.   [DISCONTINUED] lamoTRIgine  (LAMICTAL ) 200 MG tablet Take 1 tablet (200 mg total) by mouth daily.   [DISCONTINUED] temazepam  (RESTORIL ) 15 MG capsule Take 1 capsule (15 mg total) by mouth at bedtime as needed for sleep.   No facility-administered encounter medications on file as of 05/12/2024.    No results found for this or any previous visit (from the past 2160 hours).   Psychiatric Specialty Exam: Physical Exam  Review of Systems  Weight 209 lb (94.8 kg).Body mass index is  30.86 kg/m.  General Appearance: Casual  Eye Contact:  Fair  Speech:  fast  Volume:  Normal  Mood:  Anxious  Affect:  Labile  Thought Process:  Descriptions of Associations: Intact  Orientation:  Full (Time, Place, and Person)  Thought Content:  WDL  Suicidal Thoughts:  No  Homicidal Thoughts:  No  Memory:  Immediate;   Good Recent;   Good Remote;   Good  Judgement:  Fair  Insight:  Shallow  Psychomotor Activity:  Increased  Concentration:  Concentration: Fair and Attention Span: Fair  Recall:  Fair  Fund of Knowledge:  Good  Language:  Good  Akathisia:  No  Handed:  Right  AIMS (if indicated):     Assets:  Communication Skills Desire for Improvement Housing Social Support Transportation  ADL's:  Intact  Cognition:  WNL  Sleep:  good       01/25/2024    9:28 AM 01/13/2023   10:33 AM 11/22/2018   10:23 AM 08/16/2018   10:47 AM 05/18/2018    9:00 AM  Depression screen PHQ 2/9  Decreased Interest 0 0 0 0 0  Down, Depressed, Hopeless 0 0 0 0 0  PHQ - 2 Score 0 0 0 0 0  Altered sleeping 0 0 0 0 0  Tired, decreased energy 0 0 0 0 0  Change in appetite 0 0 0 0 0  Feeling bad or failure about yourself  0 0 0 0 0  Trouble concentrating 0 0 0 0 0  Moving slowly or fidgety/restless 0 0 0 0 0  Suicidal thoughts 0 0 0 0 0  PHQ-9 Score 0 0 0 0 0  Difficult doing work/chores Not difficult at all        Assessment/Plan: Bipolar I disorder (HCC) - Plan: lamoTRIgine  (LAMICTAL ) 200 MG tablet, temazepam  (RESTORIL ) 15 MG capsule, hydrOXYzine  (VISTARIL ) 25 MG capsule  Anxiety - Plan: citalopram  (CELEXA ) 20 MG tablet, lamoTRIgine  (LAMICTAL ) 200 MG tablet, temazepam  (RESTORIL ) 15 MG capsule, hydrOXYzine  (VISTARIL ) 25 MG capsule  Attention deficit hyperactivity disorder (ADHD), combined type - Plan: citalopram  (CELEXA ) 20 MG tablet, lamoTRIgine  (LAMICTAL ) 200 MG tablet  Patient is 48 year old female with history of ADHD, anxiety, bipolar disorder.  Currently on a weight loss program.   She is taking temazepam , Lamictal  and Celexa .  Discussed residual anxiety and hyperactivity.  In the past she had tried stimulants and nonstimulants but increases liver enzymes.  Now her liver enzymes are normal.  I recommend to trial low-dose hydroxyzine  to help calm her down.  However emphasis given to have blood work regularly check and she has appointment coming up in a month to see her doctor and she will get the blood work.  I will continue temazepam  50 mg at bedtime, Lamictal  200 g daily and Celexa  20 mg daily and we will add low-dose hydroxyzine  25 mg twice a day to take as needed for anxiety.  Encourage to watch  carefully as medicine can cause sedation.  Will follow-up in 3 months unless patient needing a sooner appointment.   Follow Up Instructions:     I discussed the assessment and treatment plan with the patient. The patient was provided an opportunity to ask questions and all were answered. The patient agreed with the plan and demonstrated an understanding of the instructions.   The patient was advised to call back or seek an in-person evaluation if the symptoms worsen or if the condition fails to improve as anticipated.    Collaboration of Care: Other provider involved in patient's care AEB notes are available in epic to review  Patient/Guardian was advised Release of Information must be obtained prior to any record release in order to collaborate their care with an outside provider. Patient/Guardian was advised if they have not already done so to contact the registration department to sign all necessary forms in order for us  to release information regarding their care.   Consent: Patient/Guardian gives verbal consent for treatment and assignment of benefits for services provided during this visit. Patient/Guardian expressed understanding and agreed to proceed.     Total encounter time 25 minutes which includes face-to-face time, chart reviewed, care coordination, order entry and  documentation during this encounter.   Note: This document was prepared by [employer] voice dictation technology and any errors that results from this process are unintentional.    Leni ONEIDA Client, MD 05/12/2024

## 2024-05-15 ENCOUNTER — Ambulatory Visit (INDEPENDENT_AMBULATORY_CARE_PROVIDER_SITE_OTHER): Admitting: Internal Medicine

## 2024-05-15 ENCOUNTER — Encounter (INDEPENDENT_AMBULATORY_CARE_PROVIDER_SITE_OTHER): Payer: Self-pay | Admitting: Internal Medicine

## 2024-05-15 VITALS — BP 95/67 | HR 78 | Temp 97.8°F | Ht 69.0 in | Wt 215.0 lb

## 2024-05-15 DIAGNOSIS — E669 Obesity, unspecified: Secondary | ICD-10-CM | POA: Diagnosis not present

## 2024-05-15 DIAGNOSIS — Z6830 Body mass index (BMI) 30.0-30.9, adult: Secondary | ICD-10-CM | POA: Diagnosis not present

## 2024-05-15 DIAGNOSIS — E88819 Insulin resistance, unspecified: Secondary | ICD-10-CM | POA: Diagnosis not present

## 2024-05-15 DIAGNOSIS — R638 Other symptoms and signs concerning food and fluid intake: Secondary | ICD-10-CM

## 2024-05-15 DIAGNOSIS — K760 Fatty (change of) liver, not elsewhere classified: Secondary | ICD-10-CM | POA: Diagnosis not present

## 2024-05-15 DIAGNOSIS — E66812 Obesity, class 2: Secondary | ICD-10-CM

## 2024-05-15 MED ORDER — WEGOVY 2.4 MG/0.75ML ~~LOC~~ SOAJ: 2.4000 mg | SUBCUTANEOUS | 1 refills | Status: DC

## 2024-05-15 NOTE — Assessment & Plan Note (Signed)
 Stable.  No reports of easy bruising.   Fibrosis 4 Score = .68 (Low risk)      Interpretation for patients with NAFLD          <1.30       -  F0-F1 (Low risk)          1.30-2.67 -  Indeterminate           >2.67      -  F3-F4 (High risk)     Validated for ages 68-65    She has lost approximately 23% of total body weight since starting program and is also on GLP-1, Check liver enzymes in 4 weeks due to new psych med.

## 2024-05-15 NOTE — Assessment & Plan Note (Signed)
-  On weight promoting medications -Celexa , Vistaril  -Total body weight loss approximately 23% -Continue 1200-calorie RCNP -Continue Wegovy  plus metformin  at current dose; no side effects

## 2024-05-15 NOTE — Assessment & Plan Note (Signed)
 Improved on incretin therapy. She has increased orexigenic signaling, impaired satiety and inhibitory control. This is secondary to an abnormal energy regulation system and pathological neurohormonal pathways characteristic of excess adiposity.  In addition to nutritional and behavioral strategies she benefits from ongoing pharmacotherapy.  Continue Wegovy  at 2.4 mg once a week and metformin  for synergy.

## 2024-05-15 NOTE — Assessment & Plan Note (Signed)
 Improved with lifestyle changes and pharmacotherapy continue current weight management strategy.

## 2024-05-15 NOTE — Progress Notes (Signed)
 Office: [phone]  /  Fax: [phone]  Weight Summary And Body Composition Analysis (BIA)  Vitals Temp: 97.8 F (36.6 C) BP: 95/67 Pulse Rate: 78 SpO2: 99 %   Anthropometric Measurements Height: 5' 9 (1.753 m) Weight: 215 lb (97.5 kg) BMI (Calculated): 31.74 Weight at Last Visit: 209 lb Weight Lost Since Last Visit: 0lb Weight Gained Since Last Visit: 6 lb Starting Weight: 268 lb Total Weight Loss (lbs): 53 lb (24 kg) Peak Weight: 450   Body Composition  Body Fat %: 39.6 % Fat Mass (lbs): 85.4 lbs Muscle Mass (lbs): 123.6 lbs Total Body Water (lbs): 83 lbs Visceral Fat Rating : 9    RMR: 2102  Today's Visit #: 23  Starting Date: 08/28/22   Subjective   Chief Complaint: Obesity  Sarah Salas is here to discuss her progress with her obesity treatment plan. She is following the Category 2 plan - 1200 kcal per day and states she is following her eating plan approximately 70-80% of the time. She states she is exercising 120 minutes 5 times per week.. 120 Weight Progress Since Last Visit:  Since last office visit she has gained 6 pounds. She reports fair adherence to reduced calorie nutritional plan. She has been working on reading food labels, not skipping meals, increasing protein intake at every meal, eating more fruits, eating more vegetables, drinking more water, making healthier choices, continues to exercise, reducing portion sizes, incorporating more whole foods, and was recently started on vistaril  by psych.   Nutritional 24 HR Recall: Intake consistent with prescribed nutritional plan  Challenges affecting patient progress: presence of obesogenic drugs.   Orexigenic Control: Denies problems with appetite and hunger signals.  Denies problems with satiety and satiation.  Denies problems with eating patterns and portion control.  Denies abnormal cravings. Denies feeling deprived or restricted.   Pharmacotherapy for weight management: She is  currently taking Metformin  (off label use for incretin effect and / or insulin  resistance and / or diabetes prevention) with adequate clinical response  and without side effects. and Wegovy  with adequate clinical response  and without side effects..   Assessment and Plan   Treatment Plan For Obesity:  Recommended Dietary Goals  Tajia is currently in the action stage of change. As such, her goal is to continue weight management plan. She has agreed to: continue current plan  Behavioral Health and Counseling  We discussed the following behavioral modification strategies today: continue to work on maintaining a reduced calorie state, getting the recommended amount of protein, incorporating whole foods, making healthy choices, staying well hydrated and practicing mindfulness when eating., getting back on track after recent relapse, avoid all or none thinking, and display self-compassion.  Additional education and resources provided today: None  Recommended Physical Activity Goals  Tahnee has been advised to work up to 150 minutes of moderate intensity aerobic activity a week and strengthening exercises 2-3 times per week for cardiovascular health, weight loss maintenance and preservation of muscle mass.   She has agreed to :  Think about enjoyable ways to increase daily physical activity and overcoming barriers to exercise and Increase physical activity in their day and reduce sedentary time (increase NEAT).  Pharmacotherapy and Medical Interventions  Adequate clinical response to anti-obesity medication, continue current regimen  Associated Conditions Impacted by Obesity Treatment  Assessment & Plan Abnormal food appetite Improved on incretin therapy. She has increased orexigenic signaling, impaired satiety and inhibitory control. This is secondary to an abnormal energy regulation system and pathological neurohormonal  pathways characteristic of excess adiposity.  In addition to  nutritional and behavioral strategies she benefits from ongoing pharmacotherapy.  Continue Wegovy  at 2.4 mg once a week and metformin  for synergy.  Metabolic dysfunction-associated steatotic liver disease (MASLD) Stable.  No reports of easy bruising.   Fibrosis 4 Score = .68 (Low risk)      Interpretation for patients with NAFLD          <1.30       -  F0-F1 (Low risk)          1.30-2.67 -  Indeterminate           >2.67      -  F3-F4 (High risk)     Validated for ages 36-65    She has lost approximately 23% of total body weight since starting program and is also on GLP-1, Check liver enzymes in 4 weeks due to new psych med. Insulin  resistance Improved with lifestyle changes and pharmacotherapy continue current weight management strategy. Generalized obesity -On weight promoting medications -Celexa , Vistaril  -Total body weight loss approximately 23% -Continue 1200-calorie RCNP -Continue Wegovy  plus metformin  at current dose; no side effects BMI 30.0-30.9,adult     Objective   Physical Exam:  Blood pressure 95/67, pulse 78, temperature 97.8 F (36.6 C), height 5' 9 (1.753 m), weight 215 lb (97.5 kg), SpO2 99%. Body mass index is 31.75 kg/m.  General: She is overweight, cooperative, alert, well developed, and in no acute distress. PSYCH: Has normal mood, affect and thought process.   HEENT: EOMI, sclerae are anicteric. Lungs: Normal breathing effort, no conversational dyspnea. Extremities: No edema.  Neurologic: No gross sensory or motor deficits. No tremors or fasciculations noted.    Diagnostic Data Reviewed:  BMET    Component Value Date/Time   NA 143 01/18/2024 0757   K 5.1 01/18/2024 0757   CL 104 01/18/2024 0757   CO2 23 01/18/2024 0757   GLUCOSE 83 01/18/2024 0757   GLUCOSE 93 07/19/2023 1155   BUN 10 01/18/2024 0757   CREATININE 0.77 01/18/2024 0757   CALCIUM 10.0 01/18/2024 0757   GFRNONAA >60 05/20/2022 0155   GFRAA 101 11/25/2020 0919   Lab Results   Component Value Date   HGBA1C 5.2 01/18/2024   HGBA1C 5.3 12/26/2019   Lab Results  Component Value Date   INSULIN  7.6 01/18/2024   INSULIN  13.6 11/04/2022   Lab Results  Component Value Date   TSH 1.250 01/13/2023   CBC    Component Value Date/Time   WBC 5.0 01/18/2024 0757   WBC 6.7 05/12/2022 0959   RBC 4.96 01/18/2024 0757   RBC 4.85 05/12/2022 0959   HGB 14.5 01/18/2024 0757   HCT 43.7 01/18/2024 0757   PLT 311 01/18/2024 0757   MCV 88 01/18/2024 0757   MCH 29.2 01/18/2024 0757   MCH 29.5 05/12/2022 0959   MCHC 33.2 01/18/2024 0757   MCHC 32.9 05/12/2022 0959   RDW 13.0 01/18/2024 0757   Iron Studies    Component Value Date/Time   IRON 68 11/18/2022 1117   TIBC 329 11/18/2022 1117   FERRITIN 62 11/18/2022 1117   IRONPCTSAT 21 11/18/2022 1117   Lipid Panel     Component Value Date/Time   CHOL 191 01/18/2024 0757   TRIG 106 01/18/2024 0757   HDL 53 01/18/2024 0757   CHOLHDL 4.3 01/13/2023 1344   LDLCALC 119 (H) 01/18/2024 0757   Hepatic Function Panel     Component Value Date/Time   PROT 6.9  01/18/2024 0757   ALBUMIN 4.7 01/18/2024 0757   AST 16 01/18/2024 0757   ALT 12 01/18/2024 0757   ALKPHOS 92 01/18/2024 0757   BILITOT 0.3 01/18/2024 0757   BILIDIR 0.0 11/02/2023 1024      Component Value Date/Time   TSH 1.250 01/13/2023 1344   Nutritional Lab Results  Component Value Date   VD25OH 49.8 04/19/2023   VD25OH 21.8 (L) 11/04/2022    Medications: Outpatient Encounter Medications as of 05/15/2024  Medication Sig   Cholecalciferol (VITAMIN D3) 50 MCG (2000 UT) capsule Take 1 capsule (2,000 Units total) by mouth daily.   citalopram  (CELEXA ) 20 MG tablet Take 1 tablet (20 mg total) by mouth daily.   diclofenac  Sodium (VOLTAREN ) 1 % GEL Apply 4 g topically 4 (four) times daily.   ergocalciferol  (VITAMIN D2) 1.25 MG (50000 UT) capsule Take 1 capsule (50,000 Units total) by mouth once a week.   hydrOXYzine  (VISTARIL ) 25 MG capsule Take 1 capsule  (25 mg total) by mouth 2 (two) times daily as needed for anxiety.   lamoTRIgine  (LAMICTAL ) 200 MG tablet Take 1 tablet (200 mg total) by mouth daily.   metFORMIN  (GLUCOPHAGE -XR) 500 MG 24 hr tablet Take 1 tablet (500 mg total) by mouth 2 (two) times daily with a meal.   Multiple Vitamins-Minerals (MULTIVITAMIN WITH MINERALS) tablet Take 1 tablet by mouth daily.   Semaglutide -Weight Management (WEGOVY ) 2.4 MG/0.75ML SOAJ Inject 2.4 mg into the skin once a week.   temazepam  (RESTORIL ) 15 MG capsule Take 1 capsule (15 mg total) by mouth at bedtime as needed for sleep.   [DISCONTINUED] Semaglutide -Weight Management (WEGOVY ) 2.4 MG/0.75ML SOAJ Inject 2.4 mg into the skin once a week.   No facility-administered encounter medications on file as of 05/15/2024.     Follow-Up   Return in about 4 weeks (around 06/12/2024) for For Weight Mangement with Dr. Francyne.SABRA She was informed of the importance of frequent follow up visits to maximize her success with intensive lifestyle modifications for her multiple health conditions.  Attestation Statement   Reviewed by clinician on day of visit: allergies, medications, problem list, medical history, surgical history, family history, social history, and previous encounter notes.     Lucas Francyne, MD

## 2024-06-14 ENCOUNTER — Ambulatory Visit (INDEPENDENT_AMBULATORY_CARE_PROVIDER_SITE_OTHER): Admitting: Internal Medicine

## 2024-06-14 ENCOUNTER — Encounter (INDEPENDENT_AMBULATORY_CARE_PROVIDER_SITE_OTHER): Payer: Self-pay | Admitting: Internal Medicine

## 2024-06-14 VITALS — BP 100/62 | HR 80 | Temp 97.6°F | Ht 69.0 in | Wt 210.0 lb

## 2024-06-14 DIAGNOSIS — E88819 Insulin resistance, unspecified: Secondary | ICD-10-CM

## 2024-06-14 DIAGNOSIS — K760 Fatty (change of) liver, not elsewhere classified: Secondary | ICD-10-CM | POA: Diagnosis not present

## 2024-06-14 DIAGNOSIS — E66811 Obesity, class 1: Secondary | ICD-10-CM | POA: Diagnosis not present

## 2024-06-14 DIAGNOSIS — T450X5A Adverse effect of antiallergic and antiemetic drugs, initial encounter: Secondary | ICD-10-CM

## 2024-06-14 DIAGNOSIS — T43225A Adverse effect of selective serotonin reuptake inhibitors, initial encounter: Secondary | ICD-10-CM | POA: Diagnosis not present

## 2024-06-14 DIAGNOSIS — R635 Abnormal weight gain: Secondary | ICD-10-CM | POA: Insufficient documentation

## 2024-06-14 DIAGNOSIS — Z6831 Body mass index (BMI) 31.0-31.9, adult: Secondary | ICD-10-CM

## 2024-06-14 NOTE — Assessment & Plan Note (Signed)
 Check liver enzymes today.  Overall from her peak weight she has lost 46% of total body weight

## 2024-06-14 NOTE — Assessment & Plan Note (Signed)
 Weight: decrease of 58 lb (21.6%) over 1 year, 7 months  Start: 11/04/2022 268 lb (121.6 kg)  End: 06/14/2024 210 lb (95.3 kg)  Obesity with insulin  resistance and abnormal weight gain Obesity with significant weight loss progress, having lost 58 pounds, which is 21% of her body weight. Currently on Wegovy  2.4 mg weekly, achieving better than expected results with a 46% weight loss from her highest weight of 450 pounds. Continues to struggle with nighttime eating habits but has shown improvement in managing cravings. Engages in regular physical activity, including speed walking and HIIT workouts, and is advised to incorporate strengthening exercises to counteract muscle loss and manage perimenopausal changes. - Continue Wegovy  2.4 mg weekly. - Encourage continuation of current exercise regimen, including speed walking and HIIT workouts. - Incorporate strengthening exercises 2-3 times a week to offset muscle loss and manage perimenopausal changes. - Aim for 240 minutes of exercise per week, combining endurance and strengthening exercises. - Encourage maintaining 5,000 to 10,000 steps per day.

## 2024-06-14 NOTE — Progress Notes (Signed)
 Office: [phone]  /  Fax: [phone]  Weight Summary and Body Composition Analysis (BIA)  Vitals Temp: 97.6 F (36.4 C) BP: 100/62 Pulse Rate: 80 SpO2: 100 %   Anthropometric Measurements Height: 5' 9 (1.753 m) Weight: 210 lb (95.3 kg) BMI (Calculated): 31 Weight at Last Visit: 215 lb Weight Lost Since Last Visit: 5 lb Weight Gained Since Last Visit: 0 lb Starting Weight: 268 lb Total Weight Loss (lbs): 58 lb (26.3 kg) Peak Weight: 450 lb   Body Composition  Body Fat %: 38.7 % Fat Mass (lbs): 81.6 lbs Muscle Mass (lbs): 122.4 lbs Total Body Water (lbs): 81 lbs Visceral Fat Rating : 8    RMR: 2102  Today's Visit #: 24  Starting Date: 08/28/22   Subjective   Chief Complaint: Obesity  Interval History Discussed the use of AI scribe software for clinical note transcription with the patient, who gave verbal consent to proceed.  History of Present Illness Sarah Salas is a 48 year old female with obesity, MASLD, and insulin  resistance who presents for medical weight management.  She has been on Wegovy  2.4 mg once a week for obesity management and has achieved a weight loss of 58 pounds, approximately 21% of her body weight. She is pleased with her progress, having lost five pounds recently, and is motivated to continue her weight loss journey. She has a history of eating at night, a habit developed while living with a friend, but is working on breaking this habit by focusing on not eating when not hungry.  She has a history of bipolar disorder and is currently taking hydroxyzine  and Celexa , which are not considered obesogenic. She is also on metformin  for insulin  resistance and takes B12 supplements daily. Her psychologist has started her on a new calming medication, which she was concerned might affect her liver or cause weight gain.  She engages in regular physical activity, including speed walking five and a half miles every morning and participating in  HIIT workouts with her sister. She has not been to the gym in three months but plans to resume weight training. She aims to maintain a weekly exercise volume of 240 minutes.  She has concerns about weight gain associated with perimenopause and menopause, particularly in the abdominal area. She is managing her weight through a combination of aerobic and strength exercises and is mindful of her protein intake to support muscle maintenance.  She lives with her mother and is actively involved in her niece's life, often driving her to various activities. She has a boyfriend and she is planning a future together, potentially moving.       Challenges affecting patient progress: strong hunger signals and/or impaired satiety / inhibitory control and presence of obesogenic drugs.    Pharmacotherapy for weight management: She is currently taking Wegovy  with adequate clinical response  and without side effects..   Assessment and Plan   Treatment Plan For Obesity:  Recommended Dietary Goals  Sarah Salas is currently in the action stage of change. As such, her goal is to continue weight management plan. She has agreed to: continue current plan  Behavioral Health and Counseling  We discussed the following behavioral modification strategies today: continue to work on maintaining a reduced calorie state, getting the recommended amount of protein, incorporating whole foods, making healthy choices, staying well hydrated and practicing mindfulness when eating. and increase protein intake, fibrous foods (25 grams per day for women, 30 grams for men) and water to improve satiety and decrease  hunger signals. .  Additional education and resources provided today: None  Recommended Physical Activity Goals  Sarah Salas has been advised to work up to 150 minutes of moderate intensity aerobic activity a week and strengthening exercises 2-3 times per week for cardiovascular health, weight loss maintenance and preservation of  muscle mass.  She has agreed to :  Increase volume of physical activity to a goal of 240 minutes a week and Combine aerobic and strengthening exercises for efficiency and improved cardiometabolic health.  Medical Interventions and Pharmacotherapy  We discussed various medication options to help Sarah Salas with her weight loss efforts and we both agreed to : Adequate clinical response to anti-obesity medication, continue current regimen  Associated Conditions Impacted by Obesity Treatment  Assessment & Plan Weight gain due to medication Patient is on citalopram  and hydroxyzine  which may cause weight gain.  Continue with current weight management strategy Metabolic dysfunction-associated steatotic liver disease (MASLD) Check liver enzymes today.  Overall from her peak weight she has lost 46% of total body weight Insulin  resistance On metformin  Wegovy  for pharmacoprophylaxis.  No side effects reported.  She is taking B12 daily so no need to check levels. Class 1 obesity with serious comorbidity and body mass index (BMI) of 31.0 to 31.9 in adult, unspecified obesity type Weight: decrease of 58 lb (21.6%) over 1 year, 7 months  Start: 11/04/2022 268 lb (121.6 kg)  End: 06/14/2024 210 lb (95.3 kg)  Obesity with insulin  resistance and abnormal weight gain Obesity with significant weight loss progress, having lost 58 pounds, which is 21% of her body weight. Currently on Wegovy  2.4 mg weekly, achieving better than expected results with a 46% weight loss from her highest weight of 450 pounds. Continues to struggle with nighttime eating habits but has shown improvement in managing cravings. Engages in regular physical activity, including speed walking and HIIT workouts, and is advised to incorporate strengthening exercises to counteract muscle loss and manage perimenopausal changes. - Continue Wegovy  2.4 mg weekly. - Encourage continuation of current exercise regimen, including speed walking and HIIT  workouts. - Incorporate strengthening exercises 2-3 times a week to offset muscle loss and manage perimenopausal changes. - Aim for 240 minutes of exercise per week, combining endurance and strengthening exercises. - Encourage maintaining 5,000 to 10,000 steps per day.        Objective   Physical Exam:  Blood pressure 100/62, pulse 80, temperature 97.6 F (36.4 C), height 5' 9 (1.753 m), weight 210 lb (95.3 kg), SpO2 100%. Body mass index is 31.01 kg/m.  General: She is overweight, cooperative, alert, well developed, and in no acute distress. PSYCH: Has normal mood, affect and thought process.   HEENT: EOMI, sclerae are anicteric. Lungs: Normal breathing effort, no conversational dyspnea. Extremities: No edema.  Neurologic: No gross sensory or motor deficits. No tremors or fasciculations noted.    Diagnostic Data Reviewed:  BMET    Component Value Date/Time   NA 143 01/18/2024 0757   K 5.1 01/18/2024 0757   CL 104 01/18/2024 0757   CO2 23 01/18/2024 0757   GLUCOSE 83 01/18/2024 0757   GLUCOSE 93 07/19/2023 1155   BUN 10 01/18/2024 0757   CREATININE 0.77 01/18/2024 0757   CALCIUM 10.0 01/18/2024 0757   GFRNONAA >60 05/20/2022 0155   GFRAA 101 11/25/2020 0919   Lab Results  Component Value Date   HGBA1C 5.2 01/18/2024   HGBA1C 5.3 12/26/2019   Lab Results  Component Value Date   INSULIN  7.6 01/18/2024  INSULIN  13.6 11/04/2022   Lab Results  Component Value Date   TSH 1.250 01/13/2023   CBC    Component Value Date/Time   WBC 5.0 01/18/2024 0757   WBC 6.7 05/12/2022 0959   RBC 4.96 01/18/2024 0757   RBC 4.85 05/12/2022 0959   HGB 14.5 01/18/2024 0757   HCT 43.7 01/18/2024 0757   PLT 311 01/18/2024 0757   MCV 88 01/18/2024 0757   MCH 29.2 01/18/2024 0757   MCH 29.5 05/12/2022 0959   MCHC 33.2 01/18/2024 0757   MCHC 32.9 05/12/2022 0959   RDW 13.0 01/18/2024 0757   Iron Studies    Component Value Date/Time   IRON 68 11/18/2022 1117   TIBC 329  11/18/2022 1117   FERRITIN 62 11/18/2022 1117   IRONPCTSAT 21 11/18/2022 1117   Lipid Panel     Component Value Date/Time   CHOL 191 01/18/2024 0757   TRIG 106 01/18/2024 0757   HDL 53 01/18/2024 0757   CHOLHDL 4.3 01/13/2023 1344   LDLCALC 119 (H) 01/18/2024 0757   Hepatic Function Panel     Component Value Date/Time   PROT 6.9 01/18/2024 0757   ALBUMIN 4.7 01/18/2024 0757   AST 16 01/18/2024 0757   ALT 12 01/18/2024 0757   ALKPHOS 92 01/18/2024 0757   BILITOT 0.3 01/18/2024 0757   BILIDIR 0.0 11/02/2023 1024      Component Value Date/Time   TSH 1.250 01/13/2023 1344   Nutritional Lab Results  Component Value Date   VD25OH 49.8 04/19/2023   VD25OH 21.8 (L) 11/04/2022    Medications: Outpatient Encounter Medications as of 06/14/2024  Medication Sig   Cholecalciferol (VITAMIN D3) 50 MCG (2000 UT) capsule Take 1 capsule (2,000 Units total) by mouth daily.   citalopram  (CELEXA ) 20 MG tablet Take 1 tablet (20 mg total) by mouth daily.   diclofenac  Sodium (VOLTAREN ) 1 % GEL Apply 4 g topically 4 (four) times daily.   ergocalciferol  (VITAMIN D2) 1.25 MG (50000 UT) capsule Take 1 capsule (50,000 Units total) by mouth once a week.   hydrOXYzine  (VISTARIL ) 25 MG capsule Take 1 capsule (25 mg total) by mouth 2 (two) times daily as needed for anxiety.   lamoTRIgine  (LAMICTAL ) 200 MG tablet Take 1 tablet (200 mg total) by mouth daily.   metFORMIN  (GLUCOPHAGE -XR) 500 MG 24 hr tablet Take 1 tablet (500 mg total) by mouth 2 (two) times daily with a meal.   Multiple Vitamins-Minerals (MULTIVITAMIN WITH MINERALS) tablet Take 1 tablet by mouth daily.   Semaglutide -Weight Management (WEGOVY ) 2.4 MG/0.75ML SOAJ Inject 2.4 mg into the skin once a week.   temazepam  (RESTORIL ) 15 MG capsule Take 1 capsule (15 mg total) by mouth at bedtime as needed for sleep.   No facility-administered encounter medications on file as of 06/14/2024.     Follow-Up   Return in about 4 weeks (around  07/12/2024) for For Weight Mangement with Dr. Francyne.SABRA She was informed of the importance of frequent follow up visits to maximize her success with intensive lifestyle modifications for her multiple health conditions.  Attestation Statement   Reviewed by clinician on day of visit: allergies, medications, problem list, medical history, surgical history, family history, social history, and previous encounter notes.     Lucas Francyne, MD

## 2024-06-14 NOTE — Assessment & Plan Note (Signed)
 Patient is on citalopram  and hydroxyzine  which may cause weight gain.  Continue with current weight management strategy

## 2024-06-14 NOTE — Assessment & Plan Note (Signed)
 On metformin  Wegovy  for pharmacoprophylaxis.  No side effects reported.  She is taking B12 daily so no need to check levels.

## 2024-06-15 ENCOUNTER — Ambulatory Visit (INDEPENDENT_AMBULATORY_CARE_PROVIDER_SITE_OTHER): Payer: Self-pay | Admitting: Internal Medicine

## 2024-06-15 DIAGNOSIS — E875 Hyperkalemia: Secondary | ICD-10-CM | POA: Diagnosis not present

## 2024-06-15 LAB — CMP14+EGFR
ALT: 17 IU/L (ref 0–32)
AST: 17 IU/L (ref 0–40)
Albumin: 4.7 g/dL (ref 3.9–4.9)
Alkaline Phosphatase: 104 IU/L (ref 44–121)
BUN/Creatinine Ratio: 18 (ref 9–23)
BUN: 15 mg/dL (ref 6–24)
Bilirubin Total: 0.4 mg/dL (ref 0.0–1.2)
CO2: 23 mmol/L (ref 20–29)
Calcium: 10.3 mg/dL — ABNORMAL HIGH (ref 8.7–10.2)
Chloride: 104 mmol/L (ref 96–106)
Creatinine, Ser: 0.83 mg/dL (ref 0.57–1.00)
Globulin, Total: 2.3 g/dL (ref 1.5–4.5)
Glucose: 89 mg/dL (ref 70–99)
Potassium: 5.8 mmol/L — ABNORMAL HIGH (ref 3.5–5.2)
Sodium: 143 mmol/L (ref 134–144)
Total Protein: 7 g/dL (ref 6.0–8.5)
eGFR: 88 mL/min/1.73 (ref >59)

## 2024-06-16 LAB — CMP14+EGFR
ALT: 14 IU/L (ref 0–32)
AST: 13 IU/L (ref 0–40)
Albumin: 4.3 g/dL (ref 3.9–4.9)
Alkaline Phosphatase: 96 IU/L (ref 44–121)
BUN/Creatinine Ratio: 27 — ABNORMAL HIGH (ref 9–23)
BUN: 20 mg/dL (ref 6–24)
Bilirubin Total: 0.2 mg/dL (ref 0.0–1.2)
CO2: 21 mmol/L (ref 20–29)
Calcium: 9.7 mg/dL (ref 8.7–10.2)
Chloride: 105 mmol/L (ref 96–106)
Creatinine, Ser: 0.73 mg/dL (ref 0.57–1.00)
Globulin, Total: 2 g/dL (ref 1.5–4.5)
Glucose: 90 mg/dL (ref 70–99)
Potassium: 5 mmol/L (ref 3.5–5.2)
Sodium: 142 mmol/L (ref 134–144)
Total Protein: 6.3 g/dL (ref 6.0–8.5)
eGFR: 103 mL/min/1.73 (ref >59)

## 2024-06-19 ENCOUNTER — Other Ambulatory Visit (HOSPITAL_COMMUNITY): Payer: Self-pay | Admitting: Psychiatry

## 2024-06-19 DIAGNOSIS — F419 Anxiety disorder, unspecified: Secondary | ICD-10-CM

## 2024-06-19 DIAGNOSIS — F319 Bipolar disorder, unspecified: Secondary | ICD-10-CM

## 2024-06-20 ENCOUNTER — Telehealth (HOSPITAL_BASED_OUTPATIENT_CLINIC_OR_DEPARTMENT_OTHER): Admitting: Psychiatry

## 2024-06-20 ENCOUNTER — Encounter (HOSPITAL_COMMUNITY): Payer: Self-pay | Admitting: Psychiatry

## 2024-06-20 VITALS — Wt 210.0 lb

## 2024-06-20 DIAGNOSIS — F319 Bipolar disorder, unspecified: Secondary | ICD-10-CM

## 2024-06-20 DIAGNOSIS — F902 Attention-deficit hyperactivity disorder, combined type: Secondary | ICD-10-CM

## 2024-06-20 DIAGNOSIS — F419 Anxiety disorder, unspecified: Secondary | ICD-10-CM

## 2024-06-20 MED ORDER — HYDROXYZINE PAMOATE 50 MG PO CAPS: 50.0000 mg | ORAL_CAPSULE | Freq: Two times a day (BID) | ORAL | 1 refills | Status: DC | PRN

## 2024-06-20 NOTE — Progress Notes (Signed)
 [facility] MD Virtual Progress Note   Patient Location: Home Provider Location: Home Office  I connect with patient by video and verified that I am speaking with correct person by using two identifiers. I discussed the limitations of evaluation and management by telemedicine and the availability of in person appointments. I also discussed with the patient that there may be a patient responsible charge related to this service. The patient expressed understanding and agreed to proceed.  Sarah Salas [id number] 48 y.o.  06/20/2024 8:26 AM  History of Present Illness:  Patient is evaluated by video session.  On the last visit we started her on low-dose hydroxyzine  because she like to try something to calm her down.  She noticed marginal improvement and now she feels it helps but does not last long.  She is taking 25 mg twice a day.  Recently she had a visit with primary care and had blood work.  She is pleased that labs are normal and liver enzymes are within normal limit.  She has mild elevation of BUN/creatinine ratio but she admitted not drinking enough water and now would like to restart herself hydration and exercise.  She usually does exercise with her sister which she has not done in a while but now she has scheduled to work out with her.  She reported not getting irritable, angry, manic but is still sometimes very hyper and anxious.  She sleeps good with the help of temazepam .  She still have distraction during the conversation and had difficulty in focus.  She has no tremor or shakes or any EPS.  She reported a good relationship with her boyfriend.  She also reported a better relationship with her mother but her stepfather is not doing very well.  Her appetite is okay.  She is trying to lose weight and now she is scheduled to go to gym and exercise with her sister.  She denies any active or passive suicidal thoughts or homicidal thoughts.  She denies any anger or any hallucination.   She is taking Celexa , temazepam , lamotrigine  as prescribed.  She has no rash or any itching.  Past Psychiatric History: H/O ADHD, bipolar disorder. Vyvanse , Strattera , Hydroxyzine  and Trazodone  did not help.  Adderall helped.  Latuda  helped in the beginning but then got worse with increased dose.  No h/o paranoia, inpatient treatment or suicidal attempt. H/O TBI.  Gabapentin  and Abilify  helped but caused increased weight gain, liver enzyme and blood sugar.    Past Medical History:  Diagnosis Date   ADHD (attention deficit hyperactivity disorder)    Allergy    Normal allergies   Anxiety    Arthritis    Bipolar 1 disorder (HCC)    Brain injury (HCC) 2005   Glaucoma    Hyperlipidemia    Hypertension    no longer treated    Outpatient Encounter Medications as of 06/20/2024  Medication Sig   Cholecalciferol (VITAMIN D3) 50 MCG (2000 UT) capsule Take 1 capsule (2,000 Units total) by mouth daily.   citalopram  (CELEXA ) 20 MG tablet Take 1 tablet (20 mg total) by mouth daily.   diclofenac  Sodium (VOLTAREN ) 1 % GEL Apply 4 g topically 4 (four) times daily.   ergocalciferol  (VITAMIN D2) 1.25 MG (50000 UT) capsule Take 1 capsule (50,000 Units total) by mouth once a week.   hydrOXYzine  (VISTARIL ) 25 MG capsule Take 1 capsule (25 mg total) by mouth 2 (two) times daily as needed for anxiety.   lamoTRIgine  (LAMICTAL ) 200 MG tablet  Take 1 tablet (200 mg total) by mouth daily.   metFORMIN  (GLUCOPHAGE -XR) 500 MG 24 hr tablet Take 1 tablet (500 mg total) by mouth 2 (two) times daily with a meal.   Multiple Vitamins-Minerals (MULTIVITAMIN WITH MINERALS) tablet Take 1 tablet by mouth daily.   Semaglutide -Weight Management (WEGOVY ) 2.4 MG/0.75ML SOAJ Inject 2.4 mg into the skin once a week.   temazepam  (RESTORIL ) 15 MG capsule Take 1 capsule (15 mg total) by mouth at bedtime as needed for sleep.   No facility-administered encounter medications on file as of 06/20/2024.    Recent Results (from the past 2160  hours)  CMP14+EGFR     Status: Abnormal   Collection Time: 06/14/24  8:09 AM  Result Value Ref Range   Glucose 89 70 - 99 mg/dL   BUN 15 6 - 24 mg/dL   Creatinine, Ser 9.16 0.57 - 1.00 mg/dL   eGFR 88 >40 fO/fpw/8.26   BUN/Creatinine Ratio 18 9 - 23   Sodium 143 134 - 144 mmol/L   Potassium 5.8 (H) 3.5 - 5.2 mmol/L   Chloride 104 96 - 106 mmol/L   CO2 23 20 - 29 mmol/L   Calcium 10.3 (H) 8.7 - 10.2 mg/dL   Total Protein 7.0 6.0 - 8.5 g/dL   Albumin 4.7 3.9 - 4.9 g/dL   Globulin, Total 2.3 1.5 - 4.5 g/dL   Bilirubin Total 0.4 0.0 - 1.2 mg/dL   Alkaline Phosphatase 104 44 - 121 IU/L    Comment: **Effective June 26, 2024 Alkaline Phosphatase**   reference interval will be changing to:              Age                Female          Female           0 -  5 days         47 - 127       47 - 127           6 - 10 days         29 - 242       29 - 242          11 - 20 days        109 - 357      109 - 357          21 - 30 days         94 - 494       94 - 494           1 -  2 months      149 - 539      149 - 539           3 -  6 months      131 - 452      131 - 452           7 - 11 months      117 - 401      117 - 401   12 months -  6 years       158 - 369      158 - 369           7 - 12 years       150 - 409      150 - 409  13 years       156 - 435       78 - 227               14 years       114 - 375       64 - 161               15 years        88 - 279       56 - 134               16 years        74 - 207       51 - 121               17 years        63 - 161       47 - 113          18 - 20 years        51 - 125       42 - 106          21 - 50 years         47 - 123       41 - 116          51 - 80 years        49 - 135       51 - 125              >80 years        48 - 129       48 - 129    AST 17 0 - 40 IU/L   ALT 17 0 - 32 IU/L  CMP14+EGFR     Status: Abnormal   Collection Time: 06/15/24  8:14 AM  Result Value Ref Range   Glucose 90 70 - 99 mg/dL   BUN 20 6 - 24  mg/dL   Creatinine, Ser 9.26 0.57 - 1.00 mg/dL   eGFR 896 >40 fO/fpw/8.26   BUN/Creatinine Ratio 27 (H) 9 - 23   Sodium 142 134 - 144 mmol/L   Potassium 5.0 3.5 - 5.2 mmol/L   Chloride 105 96 - 106 mmol/L   CO2 21 20 - 29 mmol/L   Calcium 9.7 8.7 - 10.2 mg/dL   Total Protein 6.3 6.0 - 8.5 g/dL   Albumin 4.3 3.9 - 4.9 g/dL   Globulin, Total 2.0 1.5 - 4.5 g/dL   Bilirubin Total 0.2 0.0 - 1.2 mg/dL   Alkaline Phosphatase 96 44 - 121 IU/L    Comment: **Effective June 26, 2024 Alkaline Phosphatase**   reference interval will be changing to:              Age                Female          Female           0 -  5 days         47 - 127       47 - 127           6 - 10 days         29 - 242       29 - 242          11 - 20 days  109 - 357      109 - 357          21 - 30 days         94 - 494       94 - 494           1 -  2 months      149 - 539      149 - 539           3 -  6 months      131 - 452      131 - 452           7 - 11 months      117 - 401      117 - 401   12 months -  6 years       158 - 369      158 - 369           7 - 12 years       150 - 409      150 - 409               13 years       156 - 435       78 - 227               14 years       114 - 375       64 - 161               15 years        88 - 279       56 - 134               16 years        74 - 207       51 - 121               17 years        63 - 161       47 - 113          18 - 20 years        51 - 125       42 - 106          21 - 50 years         47 - 123       41 - 116          51 - 80 years        49 - 135       51 - 125              >80 years        48 - 129       48 - 129    AST 13 0 - 40 IU/L   ALT 14 0 - 32 IU/L     Psychiatric Specialty Exam: Physical Exam  Review of Systems  There were no vitals taken for this visit.There is no height or weight on file to calculate BMI.  General Appearance: Casual  Eye Contact:  Fair  Speech:  fast  Volume:  Normal  Mood:  Anxious  Affect:  Labile   Thought Process:  Descriptions of Associations: Intact  Orientation:  Full (Time, Place, and Person)  Thought Content:  WDL  Suicidal Thoughts:  No  Homicidal Thoughts:  No  Memory:  Immediate;   Good Recent;   Good Remote;   Fair  Judgement:  Intact  Insight:  Present  Psychomotor Activity:  Increased  Concentration:  Concentration: Fair and Attention Span: Fair  Recall:  Fair  Fund of Knowledge:  Good  Language:  Good  Akathisia:  No  Handed:  Right  AIMS (if indicated):     Assets:  Communication Skills Desire for Improvement Housing Social Support Transportation  ADL's:  Intact  Cognition:  WNL  Sleep:  Good       01/25/2024    9:28 AM 01/13/2023   10:33 AM 11/22/2018   10:23 AM 08/16/2018   10:47 AM 05/18/2018    9:00 AM  Depression screen PHQ 2/9  Decreased Interest 0 0 0 0 0  Down, Depressed, Hopeless 0 0 0 0 0  PHQ - 2 Score 0 0 0 0 0  Altered sleeping 0 0 0 0 0  Tired, decreased energy 0 0 0 0 0  Change in appetite 0 0 0 0 0  Feeling bad or failure about yourself  0 0 0 0 0  Trouble concentrating 0 0 0 0 0  Moving slowly or fidgety/restless 0 0 0 0 0  Suicidal thoughts 0 0 0 0 0  PHQ-9 Score 0 0 0 0 0  Difficult doing work/chores Not difficult at all        Assessment/Plan: Bipolar I disorder (HCC) - Plan: hydrOXYzine  (VISTARIL ) 50 MG capsule  Anxiety - Plan: hydrOXYzine  (VISTARIL ) 50 MG capsule  Attention deficit hyperactivity disorder (ADHD), combined type  Patient is 48 year old female with history of ADHD, anxiety disorder, bipolar disorder currently taking hydroxyzine , Celexa , Lamictal  and temazepam .  Reviewed blood work results.  Liver enzymes are normal.  She like to increase the dose of hydroxyzine  because she noticed that it does help but does not last long.  Will try hydroxyzine  50 mg twice a day.  Continue Celexa  20 mg daily and Lamictal  200 mg daily and temazepam  15 mg at bedtime.  Will follow-up in 6 weeks.  Recommend to call back if has any  question or any concern.  I will send a new prescription of hydroxyzine .  Patient has refill remaining on her other medications.   Follow Up Instructions:     I discussed the assessment and treatment plan with the patient. The patient was provided an opportunity to ask questions and all were answered. The patient agreed with the plan and demonstrated an understanding of the instructions.   The patient was advised to call back or seek an in-person evaluation if the symptoms worsen or if the condition fails to improve as anticipated.    Collaboration of Care: Other provider involved in patient's care AEB notes are available in epic to review  Patient/Guardian was advised Release of Information must be obtained prior to any record release in order to collaborate their care with an outside provider. Patient/Guardian was advised if they have not already done so to contact the registration department to sign all necessary forms in order for us  to release information regarding their care.   Consent: Patient/Guardian gives verbal consent for treatment and assignment of benefits for services provided during this visit. Patient/Guardian expressed understanding and agreed to proceed.     Total encounter time 14 minutes which includes face-to-face time, chart reviewed, care coordination, order entry and documentation during this encounter.   Note: This document was prepared by [employer] and any errors that  results from this process are unintentional.    Leni ONEIDA Client, MD 06/20/2024

## 2024-07-01 ENCOUNTER — Other Ambulatory Visit (INDEPENDENT_AMBULATORY_CARE_PROVIDER_SITE_OTHER): Payer: Self-pay | Admitting: Internal Medicine

## 2024-07-01 DIAGNOSIS — E88819 Insulin resistance, unspecified: Secondary | ICD-10-CM

## 2024-07-01 DIAGNOSIS — E66812 Morbid (severe) obesity due to excess calories: Secondary | ICD-10-CM

## 2024-07-01 DIAGNOSIS — K760 Fatty (change of) liver, not elsewhere classified: Secondary | ICD-10-CM

## 2024-07-12 ENCOUNTER — Encounter (INDEPENDENT_AMBULATORY_CARE_PROVIDER_SITE_OTHER): Payer: Self-pay | Admitting: Internal Medicine

## 2024-07-12 ENCOUNTER — Ambulatory Visit (INDEPENDENT_AMBULATORY_CARE_PROVIDER_SITE_OTHER): Admitting: Internal Medicine

## 2024-07-12 VITALS — BP 109/73 | HR 82 | Temp 97.8°F | Ht 69.0 in | Wt 216.0 lb

## 2024-07-12 DIAGNOSIS — E88819 Insulin resistance, unspecified: Secondary | ICD-10-CM

## 2024-07-12 DIAGNOSIS — K760 Fatty (change of) liver, not elsewhere classified: Secondary | ICD-10-CM | POA: Diagnosis not present

## 2024-07-12 DIAGNOSIS — R638 Other symptoms and signs concerning food and fluid intake: Secondary | ICD-10-CM | POA: Diagnosis not present

## 2024-07-12 DIAGNOSIS — Z6831 Body mass index (BMI) 31.0-31.9, adult: Secondary | ICD-10-CM

## 2024-07-12 DIAGNOSIS — E66811 Obesity, class 1: Secondary | ICD-10-CM | POA: Diagnosis not present

## 2024-07-12 DIAGNOSIS — Z6836 Body mass index (BMI) 36.0-36.9, adult: Secondary | ICD-10-CM

## 2024-07-12 MED ORDER — METFORMIN HCL ER 500 MG PO TB24: 1000.0000 mg | ORAL_TABLET | Freq: Two times a day (BID) | ORAL | 0 refills | Status: DC

## 2024-07-12 MED ORDER — WEGOVY 2.4 MG/0.75ML ~~LOC~~ SOAJ: 2.4000 mg | SUBCUTANEOUS | 1 refills | Status: DC

## 2024-07-12 NOTE — Assessment & Plan Note (Signed)
 Improved on incretin therapy. She has increased orexigenic signaling, impaired satiety and inhibitory control. This is secondary to an abnormal energy regulation system and pathological neurohormonal pathways characteristic of excess adiposity.  In addition to nutritional and behavioral strategies she benefits from ongoing pharmacotherapy.  Continue Wegovy  at 2.4 mg once a week and metformin  for synergy.  Vistaril  may increase appetite

## 2024-07-12 NOTE — Assessment & Plan Note (Signed)
 Patient is entering a weight loss plateau she has been on Wegovy  for over 16 months and has lost 52 pounds with 20% of total body weight more than expected.  Has been some fluctuations in physical activity levels and was advised to increase to 240 minutes a week calibrate diet and we will increase metformin .  As stated above she has contraindications to sympathomimetics and has interactions for antiepileptic drugs which limits other antiobesity medications.  Discussed avoiding the all or none and that this is normal part of any weight loss.

## 2024-07-12 NOTE — Assessment & Plan Note (Signed)
 Stable.  Recent liver parameters within normal limits.

## 2024-07-12 NOTE — Progress Notes (Signed)
 Office: [phone]  /  Fax: [phone]  Weight Summary and Body Composition Analysis (BIA)  Vitals Temp: 97.8 F (36.6 C) BP: 109/73 Pulse Rate: 82 SpO2: 99 %   Anthropometric Measurements Height: 5' 9 (1.753 m) Weight: 216 lb (98 kg) BMI (Calculated): 31.88 Weight at Last Visit: 210 lb Weight Lost Since Last Visit: 0 lb Weight Gained Since Last Visit: 6 lb Starting Weight: 268 lb Total Weight Loss (lbs): 52 lb (23.6 kg) Peak Weight: 450 lb   Body Composition  Body Fat %: 40.4 % Fat Mass (lbs): 87.2 lbs Muscle Mass (lbs): 122.4 lbs Total Body Water (lbs): 81.6 lbs Visceral Fat Rating : 9    RMR: 2102  Today's Visit #: 25  Starting Date: 08/28/22   Subjective   Chief Complaint: Obesity  Interval History Discussed the use of AI scribe software for clinical note transcription with the patient, who gave verbal consent to proceed.  History of Present Illness Sarah Salas is a 48 year old female with insulin  resistance and MASLD who presents for medical weight management.  She is frustrated with her weight management, noting a gain of six pounds despite maintaining her routine. She has a history of fluctuating weight and is determined to continue her efforts. Her physical activity includes speed walking eight and a half miles every morning and weight training, although her exercise duration has decreased from two hours to one hour per day.  She is currently taking hydroxyzine  twice daily. She has been on Wegovy  for approximately sixteen months. She also takes metformin . No side effects such as loose stools from metformin  are reported.  Her social history includes performing weight training at her sister's gym. Her boyfriend and mother have advised her to 'slow down'.  She uses an IUD for birth control, indicating no significant risk of pregnancy.     Challenges affecting patient progress: none, presence of obesogenic drugs, all-or- none mindset, and  metabolic adaptations associated with weight loss.    Pharmacotherapy for weight management: She is currently taking Metformin  (off label use for weight management and / or insulin  resistance and / or diabetes prevention) with adequate clinical response  and without side effects. and Wegovy  with adequate clinical response  and without side effects..   Assessment and Plan   Treatment Plan For Obesity:  Recommended Dietary Goals  Sarah Salas is currently in the action stage of change. As such, her goal is to continue weight management plan. She has agreed to: continue current plan  Behavioral Health and Counseling  We discussed the following behavioral modification strategies today: continue to work on maintaining a reduced calorie state, getting the recommended amount of protein, incorporating whole foods, making healthy choices, staying well hydrated and practicing mindfulness when eating. and increase protein intake, fibrous foods (25 grams per day for women, 30 grams for men) and water to improve satiety and decrease hunger signals. .  Additional education and resources provided today: None  Recommended Physical Activity Goals  Sarah Salas has been advised to work up to 150 minutes of moderate intensity aerobic activity a week and strengthening exercises 2-3 times per week for cardiovascular health, weight loss maintenance and preservation of muscle mass.  She has agreed to :  Increase volume of physical activity to a goal of 240 minutes a week and Combine aerobic and strengthening exercises for efficiency and improved cardiometabolic health.  Medical Interventions and Pharmacotherapy  We discussed various medication options to help Sarah Salas with her weight loss efforts and we both  agreed to : Increase increase metformin  to 2000 mg daily.  She has contraindications to zonisamide or topiramate as she is on Lamictal .  She is on Vistaril  which may cause sedation and increased hunger.  Because of bipolar  condition she is not a candidate for sympathomimetics.  Associated Conditions Impacted by Obesity Treatment  Assessment & Plan Class 1 obesity with serious comorbidity and body mass index (BMI) of 31.0 to 31.9 in adult, unspecified obesity type Patient is entering a weight loss plateau she has been on Wegovy  for over 16 months and has lost 52 pounds with 20% of total body weight more than expected.  Has been some fluctuations in physical activity levels and was advised to increase to 240 minutes a week calibrate diet and we will increase metformin .  As stated above she has contraindications to sympathomimetics and has interactions for antiepileptic drugs which limits other antiobesity medications.  Discussed avoiding the all or none and that this is normal part of any weight loss. Abnormal food appetite Improved on incretin therapy. She has increased orexigenic signaling, impaired satiety and inhibitory control. This is secondary to an abnormal energy regulation system and pathological neurohormonal pathways characteristic of excess adiposity.  In addition to nutritional and behavioral strategies she benefits from ongoing pharmacotherapy.  Continue Wegovy  at 2.4 mg once a week and metformin  for synergy.  Vistaril  may increase appetite  Insulin  resistance On metformin  and Wegovy  for pharmacoprophylaxis.  No side effects reported.  We will increase metformin  to 2000 mg daily Metabolic dysfunction-associated steatotic liver disease (MASLD) Stable.  Recent liver parameters within normal limits.          Objective   Physical Exam:  Blood pressure 109/73, pulse 82, temperature 97.8 F (36.6 C), height 5' 9 (1.753 m), weight 216 lb (98 kg), SpO2 99%. Body mass index is 31.9 kg/m.  General: She is overweight, cooperative, alert, well developed, and in no acute distress. PSYCH: Has normal mood, affect and thought process.   HEENT: EOMI, sclerae are anicteric. Lungs: Normal breathing effort,  no conversational dyspnea. Extremities: No edema.  Neurologic: No gross sensory or motor deficits. No tremors or fasciculations noted.    Diagnostic Data Reviewed:  BMET    Component Value Date/Time   NA 142 06/15/2024 0814   K 5.0 06/15/2024 0814   CL 105 06/15/2024 0814   CO2 21 06/15/2024 0814   GLUCOSE 90 06/15/2024 0814   GLUCOSE 93 07/19/2023 1155   BUN 20 06/15/2024 0814   CREATININE 0.73 06/15/2024 0814   CALCIUM 9.7 06/15/2024 0814   GFRNONAA >60 05/20/2022 0155   GFRAA 101 11/25/2020 0919   Lab Results  Component Value Date   HGBA1C 5.2 01/18/2024   HGBA1C 5.3 12/26/2019   Lab Results  Component Value Date   INSULIN  7.6 01/18/2024   INSULIN  13.6 11/04/2022   Lab Results  Component Value Date   TSH 1.250 01/13/2023   CBC    Component Value Date/Time   WBC 5.0 01/18/2024 0757   WBC 6.7 05/12/2022 0959   RBC 4.96 01/18/2024 0757   RBC 4.85 05/12/2022 0959   HGB 14.5 01/18/2024 0757   HCT 43.7 01/18/2024 0757   PLT 311 01/18/2024 0757   MCV 88 01/18/2024 0757   MCH 29.2 01/18/2024 0757   MCH 29.5 05/12/2022 0959   MCHC 33.2 01/18/2024 0757   MCHC 32.9 05/12/2022 0959   RDW 13.0 01/18/2024 0757   Iron Studies    Component Value Date/Time   IRON 68  11/18/2022 1117   TIBC 329 11/18/2022 1117   FERRITIN 62 11/18/2022 1117   IRONPCTSAT 21 11/18/2022 1117   Lipid Panel     Component Value Date/Time   CHOL 191 01/18/2024 0757   TRIG 106 01/18/2024 0757   HDL 53 01/18/2024 0757   CHOLHDL 4.3 01/13/2023 1344   LDLCALC 119 (H) 01/18/2024 0757   Hepatic Function Panel     Component Value Date/Time   PROT 6.3 06/15/2024 0814   ALBUMIN 4.3 06/15/2024 0814   AST 13 06/15/2024 0814   ALT 14 06/15/2024 0814   ALKPHOS 96 06/15/2024 0814   BILITOT 0.2 06/15/2024 0814   BILIDIR 0.0 11/02/2023 1024      Component Value Date/Time   TSH 1.250 01/13/2023 1344   Nutritional Lab Results  Component Value Date   VD25OH 49.8 04/19/2023   VD25OH 21.8  (L) 11/04/2022    Medications: Outpatient Encounter Medications as of 07/12/2024  Medication Sig   Cholecalciferol (VITAMIN D3) 50 MCG (2000 UT) capsule Take 1 capsule (2,000 Units total) by mouth daily.   citalopram  (CELEXA ) 20 MG tablet Take 1 tablet (20 mg total) by mouth daily.   diclofenac  Sodium (VOLTAREN ) 1 % GEL Apply 4 g topically 4 (four) times daily.   ergocalciferol  (VITAMIN D2) 1.25 MG (50000 UT) capsule Take 1 capsule (50,000 Units total) by mouth once a week.   hydrOXYzine  (VISTARIL ) 50 MG capsule Take 1 capsule (50 mg total) by mouth 2 (two) times daily as needed for anxiety.   lamoTRIgine  (LAMICTAL ) 200 MG tablet Take 1 tablet (200 mg total) by mouth daily.   Multiple Vitamins-Minerals (MULTIVITAMIN WITH MINERALS) tablet Take 1 tablet by mouth daily.   temazepam  (RESTORIL ) 15 MG capsule Take 1 capsule (15 mg total) by mouth at bedtime as needed for sleep.   [DISCONTINUED] metFORMIN  (GLUCOPHAGE -XR) 500 MG 24 hr tablet Take 1 tablet (500 mg total) by mouth 2 (two) times daily with a meal.   [DISCONTINUED] Semaglutide -Weight Management (WEGOVY ) 2.4 MG/0.75ML SOAJ Inject 2.4 mg into the skin once a week.   metFORMIN  (GLUCOPHAGE -XR) 500 MG 24 hr tablet Take 2 tablets (1,000 mg total) by mouth 2 (two) times daily with a meal.   semaglutide -weight management (WEGOVY ) 2.4 MG/0.75ML SOAJ SQ injection Inject 2.4 mg into the skin once a week.   No facility-administered encounter medications on file as of 07/12/2024.     Follow-Up   Return in about 4 weeks (around 08/09/2024) for For Weight Mangement with Dr. Francyne.SABRA She was informed of the importance of frequent follow up visits to maximize her success with intensive lifestyle modifications for her multiple health conditions.  Attestation Statement   Reviewed by clinician on day of visit: allergies, medications, problem list, medical history, surgical history, family history, social history, and previous encounter notes.      Lucas Francyne, MD

## 2024-07-12 NOTE — Assessment & Plan Note (Signed)
 On metformin  and Wegovy  for pharmacoprophylaxis.  No side effects reported.  We will increase metformin  to 2000 mg daily

## 2024-07-31 ENCOUNTER — Telehealth (HOSPITAL_BASED_OUTPATIENT_CLINIC_OR_DEPARTMENT_OTHER): Admitting: Psychiatry

## 2024-07-31 ENCOUNTER — Encounter (HOSPITAL_COMMUNITY): Payer: Self-pay

## 2024-07-31 ENCOUNTER — Encounter (HOSPITAL_COMMUNITY): Payer: Self-pay | Admitting: Psychiatry

## 2024-07-31 VITALS — Wt 216.0 lb

## 2024-07-31 DIAGNOSIS — F99 Mental disorder, not otherwise specified: Secondary | ICD-10-CM

## 2024-07-31 DIAGNOSIS — F5105 Insomnia due to other mental disorder: Secondary | ICD-10-CM | POA: Diagnosis not present

## 2024-07-31 DIAGNOSIS — F419 Anxiety disorder, unspecified: Secondary | ICD-10-CM | POA: Diagnosis not present

## 2024-07-31 DIAGNOSIS — F902 Attention-deficit hyperactivity disorder, combined type: Secondary | ICD-10-CM

## 2024-07-31 DIAGNOSIS — F319 Bipolar disorder, unspecified: Secondary | ICD-10-CM | POA: Diagnosis not present

## 2024-07-31 MED ORDER — CITALOPRAM HYDROBROMIDE 20 MG PO TABS
20.0000 mg | ORAL_TABLET | Freq: Every day | ORAL | 2 refills | Status: DC
Start: 2024-07-31 — End: 2024-08-25

## 2024-07-31 MED ORDER — LAMOTRIGINE 200 MG PO TABS: 200.0000 mg | ORAL_TABLET | Freq: Every day | ORAL | 2 refills | Status: DC

## 2024-07-31 MED ORDER — TEMAZEPAM 15 MG PO CAPS: 15.0000 mg | ORAL_CAPSULE | Freq: Every evening | ORAL | 2 refills | Status: DC | PRN

## 2024-07-31 NOTE — Telephone Encounter (Signed)
 BuSpar is a good medicine but it can increase his liver enzyme.  I have to wait for your blood work results and will consider low-dose if needed in the future.

## 2024-07-31 NOTE — Progress Notes (Addendum)
 [facility] MD Virtual Progress Note   Patient Location: Home Provider Location: Home Office  I connect with patient by video and verified that I am speaking with correct person by using two identifiers. I discussed the limitations of evaluation and management by telemedicine and the availability of in person appointments. I also discussed with the patient that there may be a patient responsible charge related to this service. The patient expressed understanding and agreed to proceed.  Sarah Salas [id number] 48 y.o.  07/31/2024 10:14 AM  History of Present Illness:  Patient is evaluated by video session.  She reported had a good trip to mountains with the boyfriend near [place] and she really enjoyed the time.  However she is very concerned and anxious about her weight.  She gained another 5 pounds in last month.  She is not sure what causing the weight gain.  She decided not to continue anymore hydroxyzine  because her doctor suggested stopping if that is the reason causing weight gain.  She stopped more than a week ago.  She also reported started to have some hot flashes.  She has not seen OB/GYN in a while.  Her speech remain pressured and sometime mood labile.  She admitted continue to watch her calorie intake, doing exercise, walking at least 2 hours a day.  But she did not start gym but is still doing workout.  She is taking Wegovy  since March but this month has not picked up because of the insurance problem.  She is seeing weight loss management.  She is on Celexa , Lamictal  and temazepam .  She reported sleep is very good because of temazepam .  She denies any irritability, anger but still some time hyper and anxious.  She has no rash, itching tremors or shakes.  She denies any active or passive suicidal thoughts or homicidal thoughts.  She denies any paranoia or any hallucination.  Past Psychiatric History: H/O ADHD, bipolar disorder. Vyvanse , Strattera , Hydroxyzine  and  Trazodone  did not help.  Adderall helped.  Latuda  helped in the beginning but then got worse with increased dose.  No h/o paranoia, inpatient treatment or suicidal attempt. H/O TBI.  Gabapentin  and Abilify  helped but caused increased weight gain, liver enzyme and blood sugar.    Past Medical History:  Diagnosis Date   ADHD (attention deficit hyperactivity disorder)    Allergy    Normal allergies   Anxiety    Arthritis    Bipolar 1 disorder (HCC)    Brain injury (HCC) 2005   Glaucoma    Hyperlipidemia    Hypertension    no longer treated    Outpatient Encounter Medications as of 07/31/2024  Medication Sig   Cholecalciferol (VITAMIN D3) 50 MCG (2000 UT) capsule Take 1 capsule (2,000 Units total) by mouth daily.   citalopram  (CELEXA ) 20 MG tablet Take 1 tablet (20 mg total) by mouth daily.   diclofenac  Sodium (VOLTAREN ) 1 % GEL Apply 4 g topically 4 (four) times daily.   ergocalciferol  (VITAMIN D2) 1.25 MG (50000 UT) capsule Take 1 capsule (50,000 Units total) by mouth once a week.   hydrOXYzine  (VISTARIL ) 50 MG capsule Take 1 capsule (50 mg total) by mouth 2 (two) times daily as needed for anxiety.   lamoTRIgine  (LAMICTAL ) 200 MG tablet Take 1 tablet (200 mg total) by mouth daily.   metFORMIN  (GLUCOPHAGE -XR) 500 MG 24 hr tablet Take 2 tablets (1,000 mg total) by mouth 2 (two) times daily with a meal.   Multiple Vitamins-Minerals (MULTIVITAMIN WITH MINERALS)  tablet Take 1 tablet by mouth daily.   semaglutide -weight management (WEGOVY ) 2.4 MG/0.75ML SOAJ SQ injection Inject 2.4 mg into the skin once a week.   temazepam  (RESTORIL ) 15 MG capsule Take 1 capsule (15 mg total) by mouth at bedtime as needed for sleep.   No facility-administered encounter medications on file as of 07/31/2024.    Recent Results (from the past 2160 hours)  CMP14+EGFR     Status: Abnormal   Collection Time: 06/14/24  8:09 AM  Result Value Ref Range   Glucose 89 70 - 99 mg/dL   BUN 15 6 - 24 mg/dL    Creatinine, Ser 9.16 0.57 - 1.00 mg/dL   eGFR 88 >40 fO/fpw/8.26   BUN/Creatinine Ratio 18 9 - 23   Sodium 143 134 - 144 mmol/L   Potassium 5.8 (H) 3.5 - 5.2 mmol/L   Chloride 104 96 - 106 mmol/L   CO2 23 20 - 29 mmol/L   Calcium 10.3 (H) 8.7 - 10.2 mg/dL   Total Protein 7.0 6.0 - 8.5 g/dL   Albumin 4.7 3.9 - 4.9 g/dL   Globulin, Total 2.3 1.5 - 4.5 g/dL   Bilirubin Total 0.4 0.0 - 1.2 mg/dL   Alkaline Phosphatase 104 44 - 121 IU/L    Comment: **Effective June 26, 2024 Alkaline Phosphatase**   reference interval will be changing to:              Age                Female          Female           0 -  5 days         47 - 127       47 - 127           6 - 10 days         29 - 242       29 - 242          11 - 20 days        109 - 357      109 - 357          21 - 30 days         94 - 494       94 - 494           1 -  2 months      149 - 539      149 - 539           3 -  6 months      131 - 452      131 - 452           7 - 11 months      117 - 401      117 - 401   12 months -  6 years       158 - 369      158 - 369           7 - 12 years       150 - 409      150 - 409               13 years       156 - 435       78 - 227  14 years       114 - 375       64 - 161               15 years        88 - 279       56 - 134               16 years        74 - 207       51 - 121               17 years        63 - 161       47 - 113          18 - 20 years        51 - 125       42 - 106          21 - 50 years         47 - 123       41 - 116          51 - 80 years        49 - 135       51 - 125              >80 years        48 - 129       48 - 129    AST 17 0 - 40 IU/L   ALT 17 0 - 32 IU/L  CMP14+EGFR     Status: Abnormal   Collection Time: 06/15/24  8:14 AM  Result Value Ref Range   Glucose 90 70 - 99 mg/dL   BUN 20 6 - 24 mg/dL   Creatinine, Ser 9.26 0.57 - 1.00 mg/dL   eGFR 896 >40 fO/fpw/8.26   BUN/Creatinine Ratio 27 (H) 9 - 23   Sodium 142 134 - 144 mmol/L   Potassium  5.0 3.5 - 5.2 mmol/L   Chloride 105 96 - 106 mmol/L   CO2 21 20 - 29 mmol/L   Calcium 9.7 8.7 - 10.2 mg/dL   Total Protein 6.3 6.0 - 8.5 g/dL   Albumin 4.3 3.9 - 4.9 g/dL   Globulin, Total 2.0 1.5 - 4.5 g/dL   Bilirubin Total 0.2 0.0 - 1.2 mg/dL   Alkaline Phosphatase 96 44 - 121 IU/L    Comment: **Effective June 26, 2024 Alkaline Phosphatase**   reference interval will be changing to:              Age                Female          Female           0 -  5 days         47 - 127       47 - 127           6 - 10 days         29 - 242       29 - 242          11 - 20 days        109 - 357      109 - 357          21 - 30 days         94 -  494       94 - 494           1 -  2 months      149 - 539      149 - 539           3 -  6 months      131 - 452      131 - 452           7 - 11 months      117 - 401      117 - 401   12 months -  6 years       158 - 369      158 - 369           7 - 12 years       150 - 409      150 - 409               13 years       156 - 435       78 - 227               14 years       114 - 375       64 - 161               15 years        88 - 279       56 - 134               16 years        74 - 207       51 - 121               17 years        63 - 161       47 - 113          18 - 20 years        51 - 125       42 - 106          21 - 50 years         47 - 123       41 - 116          51 - 80 years        49 - 135       51 - 125              >80 years        48 - 129       48 - 129    AST 13 0 - 40 IU/L   ALT 14 0 - 32 IU/L     Psychiatric Specialty Exam: Physical Exam  Review of Systems  Weight 216 lb (98 kg).There is no height or weight on file to calculate BMI.  General Appearance: Casual  Eye Contact:  Fair  Speech:  fast  Volume:  Increased  Mood:  Anxious  Affect:  Labile  Thought Process:  Descriptions of Associations: Circumstantial  Orientation:  Full (Time, Place, and Person)  Thought Content:  Rumination  Suicidal Thoughts:  No  Homicidal  Thoughts:  No  Memory:  Immediate;   Fair Recent;   Fair Remote;   Fair  Judgement:  Intact  Insight:  Present  Psychomotor Activity:  Increased  Concentration:  Concentration: Fair and Attention  Span: Fair  Recall:  [employer] of Knowledge:  Fair  Language:  Good  Akathisia:  No  Handed:  Right  AIMS (if indicated):     Assets:  Communication Skills Desire for Improvement Financial Resources/Insurance Social Support Transportation  ADL's:  Intact  Cognition:  WNL  Sleep: Good with temazepam        01/25/2024    9:28 AM 01/13/2023   10:33 AM 11/22/2018   10:23 AM 08/16/2018   10:47 AM 05/18/2018    9:00 AM  Depression screen PHQ 2/9  Decreased Interest 0 0 0 0 0  Down, Depressed, Hopeless 0 0 0 0 0  PHQ - 2 Score 0 0 0 0 0  Altered sleeping 0 0 0 0 0  Tired, decreased energy 0 0 0 0 0  Change in appetite 0 0 0 0 0  Feeling bad or failure about yourself  0 0 0 0 0  Trouble concentrating 0 0 0 0 0  Moving slowly or fidgety/restless 0 0 0 0 0  Suicidal thoughts 0 0 0 0 0  PHQ-9 Score 0 0 0 0 0  Difficult doing work/chores Not difficult at all        Assessment/Plan: Bipolar I disorder (HCC) - Plan: citalopram  (CELEXA ) 20 MG tablet, lamoTRIgine  (LAMICTAL ) 200 MG tablet, temazepam  (RESTORIL ) 15 MG capsule  Anxiety - Plan: citalopram  (CELEXA ) 20 MG tablet, lamoTRIgine  (LAMICTAL ) 200 MG tablet, temazepam  (RESTORIL ) 15 MG capsule  Attention deficit hyperactivity disorder (ADHD), combined type - Plan: citalopram  (CELEXA ) 20 MG tablet, lamoTRIgine  (LAMICTAL ) 200 MG tablet  Insomnia due to other mental disorder - Plan: citalopram  (CELEXA ) 20 MG tablet, temazepam  (RESTORIL ) 15 MG capsule  Patient is 48 year old female with history of ADHD, anxiety disorder, bipolar disorder and taking multiple psychotropic medication.  She has a history of high liver enzymes but last blood work in September shows improved blood work results.  Discussed anxiety related to weight gain.  Patient  stopped taking hydroxyzine  believing it may be contributing to weight gain.  I reviewed collateral information from other provider and also discussed other causes of weight gain.  Patient also reported to having hot flashes and encouraged to contact provider to get hormone level checked.  Encourage walking, watching her calorie intake.  Though unlikely hydroxyzine  causing weight gain but patient decided not to take it and she will monitor if that helps the weight loss.  I also encouraged to resume Wegovy  and she is also on metformin .  I encourage contact weight loss program if we could be in metformin  not working may consider switching to a different medication.  Patient agreed with the plan.  We will not continue hydroxyzine  as patient is no longer taking it.  Continue Celexa  20 mg daily, Lamictal  200 mg daily and temazepam  15 mg at bedtime.  She has no rash, itching, tremors or shakes.  Follow-up in 3 months.  I will also forward my note to her weight loss provider.   Follow Up Instructions:     I discussed the assessment and treatment plan with the patient. The patient was provided an opportunity to ask questions and all were answered. The patient agreed with the plan and demonstrated an understanding of the instructions.   The patient was advised to call back or seek an in-person evaluation if the symptoms worsen or if the condition fails to improve as anticipated.    Collaboration of Care: Other provider involved in patient's care AEB notes are available in epic  to review  Patient/Guardian was advised Release of Information must be obtained prior to any record release in order to collaborate their care with an outside provider. Patient/Guardian was advised if they have not already done so to contact the registration department to sign all necessary forms in order for us  to release information regarding their care.   Consent: Patient/Guardian gives verbal consent for treatment and assignment of  benefits for services provided during this visit. Patient/Guardian expressed understanding and agreed to proceed.     Total encounter time 28 minutes which includes face-to-face time, chart reviewed, care coordination, order entry and documentation during this encounter.   Note: This document was prepared by [employer] voice dictation technology and any errors that results from this process are unintentional.    Leni ONEIDA Client, MD 07/31/2024

## 2024-08-10 ENCOUNTER — Ambulatory Visit (INDEPENDENT_AMBULATORY_CARE_PROVIDER_SITE_OTHER): Admitting: Internal Medicine

## 2024-08-15 ENCOUNTER — Encounter (INDEPENDENT_AMBULATORY_CARE_PROVIDER_SITE_OTHER): Payer: Self-pay | Admitting: Internal Medicine

## 2024-08-15 ENCOUNTER — Ambulatory Visit (INDEPENDENT_AMBULATORY_CARE_PROVIDER_SITE_OTHER): Payer: Self-pay | Admitting: Internal Medicine

## 2024-08-15 VITALS — BP 106/72 | HR 81 | Temp 97.7°F | Ht 69.0 in | Wt 223.0 lb

## 2024-08-15 DIAGNOSIS — E66811 Obesity, class 1: Secondary | ICD-10-CM

## 2024-08-15 DIAGNOSIS — K760 Fatty (change of) liver, not elsewhere classified: Secondary | ICD-10-CM | POA: Diagnosis not present

## 2024-08-15 DIAGNOSIS — Z6831 Body mass index (BMI) 31.0-31.9, adult: Secondary | ICD-10-CM | POA: Diagnosis not present

## 2024-08-15 DIAGNOSIS — R638 Other symptoms and signs concerning food and fluid intake: Secondary | ICD-10-CM | POA: Diagnosis not present

## 2024-08-15 DIAGNOSIS — F319 Bipolar disorder, unspecified: Secondary | ICD-10-CM | POA: Diagnosis not present

## 2024-08-15 NOTE — Assessment & Plan Note (Signed)
 Weight: decrease of 63 lb (23.5%) over 1 year, 3 months  Start: 11/04/2022 268 lb (121.6 kg)  End: 02/15/2024 205 lb (93 kg)  Weight: increase of 18 lb (8.8%) over 6 months  Start: 02/15/2024 205 lb (93 kg)  End: 08/15/2024 223 lb (101.2 kg)  Obesity management is complicated by discontinuation of Wegovy  due to insurance coverage issues. She has gained 11 pounds since October, currently weighing 223 pounds. She is motivated to regain control over her weight and is focusing on dietary changes and exercise. Emphasis is on protein intake to increase satiety and manage hunger. Discussion of potential use of topiramate for weight management, pending psychiatrist approval due to bipolar hypomanic disorder. Metformin  is continued for insulin  management. - Continue metformin , 2 tablets in the morning and 2 at night. - Focus on dietary changes with a target of 1300 calories per day, emphasizing protein intake of 30-40 grams per meal. - Use My Fitness Pal to track caloric intake, ensuring not to replace exercise calories. - Consider using ChatGPT to create a tailored meal plan with high protein, low carb, and high fiber. - Discuss potential use of topiramate with psychiatrist for weight management. - Engage in physical activity, aiming for 300 minutes per week, including HIIT and weight lifting. - Weigh herself weekly to monitor weight trends.

## 2024-08-15 NOTE — Progress Notes (Signed)
 Office: [phone]  /  Fax: [phone]  Weight Summary and Body Composition Analysis (BIA)  Vitals Temp: 97.7 F (36.5 C) BP: 106/72 Pulse Rate: 81 SpO2: 99 %   Anthropometric Measurements Height: 5' 9 (1.753 m) Weight: 223 lb (101.2 kg) BMI (Calculated): 32.92 Weight at Last Visit: 216 lb Weight Lost Since Last Visit: 0 lb Weight Gained Since Last Visit: 7 lb Starting Weight: 268 lb Total Weight Loss (lbs): 45 lb (20.4 kg) Peak Weight: 450 lb   Body Composition  Body Fat %: 42.5 % Fat Mass (lbs): 95.2 lbs Muscle Mass (lbs): 122.2 lbs Total Body Water (lbs): 85.4 lbs Visceral Fat Rating : 10    RMR: 2102  Today's Visit #: 26  Starting Date: 08/28/22   Subjective   Chief Complaint: Obesity  Interval History Discussed the use of AI scribe software for clinical note transcription with the patient, who gave verbal consent to proceed.  History of Present Illness Sarah Salas is a 48 year old female with obesity who presents for weight management.  She has experienced weight gain after discontinuing Wegovy  due to insurance coverage issues, with her weight increasing from 216 pounds to 232 pounds over the past month. This gain is attributed to a lack of motivation after stopping the medication. Since November 1st, she has been focusing on her diet and exercise regimen, tracking her progress daily.  She uses MyFitnessPal to monitor her caloric intake, aiming for 1300 calories per day to achieve weight loss. She aims to maintain a caloric deficit. She focuses on protein intake, targeting 30 to 40 grams per meal, and incorporates fiber and water to aid satiety. Her exercise routine includes walking, HIIT workouts, and recently added weight lifting. She is mindful of not compensating for exercise calories by increasing her caloric intake.  She has concerns about perimenopausal symptoms, including hot flashes and night sweats, and takes a vitamin recommended by her  boyfriend to help with these symptoms. She has bipolar disorder, noting she is 'bipolar hypomanic' and is working on slowing down her activities.  She is currently taking metformin , two in the morning and two at night, to help with insulin  management. She has concerns about weight gain related to hormonal changes. No recent changes in her menstrual cycle due to having an IUD.     Challenges affecting patient progress: strong hunger signals and/or impaired satiety / inhibitory control and having difficulties with GLP-1 or AOM coverage.    Pharmacotherapy for weight management: She is currently taking Metformin  (off label use for weight management and / or insulin  resistance and / or diabetes prevention) with adequate clinical response  and without side effects. and has been off Wegovy  for 1 month.   Assessment and Plan   Treatment Plan For Obesity:  Recommended Dietary Goals  Ragina is currently in the action stage of change. As such, her goal is to continue weight management plan. She has agreed to: keep a food journal with a target of  1300 calories per day and 90-120 grams of protein per day or 30-40 grams per meal.  Behavioral Health and Counseling  We discussed the following behavioral modification strategies today: increasing lean protein intake to established goals, increasing vegetables, increasing fiber rich foods, avoiding skipping meals, increasing water intake , work on meal planning and preparation, work on tracking and journaling calories using tracking application, continue to work on maintaining a reduced calorie state, getting the recommended amount of protein, incorporating whole foods, making healthy choices, staying  well hydrated and practicing mindfulness when eating., and increase protein intake, fibrous foods (25 grams per day for women, 30 grams for men) and water to improve satiety and decrease hunger signals. .  Additional education and resources provided  today: Handout on staying on track during the holidays, given a personalized demonstration on how to create tailored meal plan using chat GPT  Recommended Physical Activity Goals  Teja has been advised to work up to 150 minutes of moderate intensity aerobic activity a week and strengthening exercises 2-3 times per week for cardiovascular health, weight loss maintenance and preservation of muscle mass.  She has agreed to :  Increase volume of physical activity to a goal of 240 minutes a week and Combine aerobic and strengthening exercises for efficiency and improved cardiometabolic health.  Medical Interventions and Pharmacotherapy  We discussed various medication options to help Fronnie with her weight loss efforts and we both agreed to : Patient currently off Wegovy  and experiencing some weight regain.  She is currently on high-dose metformin .  She has contraindications to sympathomimetics due to history of bipolar disorder which may increase mania.  We will discuss with her psychiatrist regarding the use of topiramate.  If psychiatry is on board we will reduce metformin  to 1000 mg a day to reduce the risk of metabolic acidosis.  Associated Conditions Impacted by Obesity Treatment  Assessment & Plan Metabolic dysfunction-associated steatotic liver disease (MASLD) Patient is losing the benefits of GLP-1 treatment therefore increasing her risk of future complications.  She will continue to work on reducing saturated fats simple and added sugars in her diet. Bipolar affective disorder, remission status unspecified (HCC) We will discuss with her psychiatrist about the use of topiramate.  We will avoid sympathomimetics at these are relatively contraindicated. Class 1 obesity with serious comorbidity and body mass index (BMI) of 31.0 to 31.9 in adult, unspecified obesity type Weight: decrease of 63 lb (23.5%) over 1 year, 3 months  Start: 11/04/2022 268 lb (121.6 kg)  End: 02/15/2024 205 lb (93 kg)   Weight: increase of 18 lb (8.8%) over 6 months  Start: 02/15/2024 205 lb (93 kg)  End: 08/15/2024 223 lb (101.2 kg)  Obesity management is complicated by discontinuation of Wegovy  due to insurance coverage issues. She has gained 11 pounds since October, currently weighing 223 pounds. She is motivated to regain control over her weight and is focusing on dietary changes and exercise. Emphasis is on protein intake to increase satiety and manage hunger. Discussion of potential use of topiramate for weight management, pending psychiatrist approval due to bipolar hypomanic disorder. Metformin  is continued for insulin  management. - Continue metformin , 2 tablets in the morning and 2 at night. - Focus on dietary changes with a target of 1300 calories per day, emphasizing protein intake of 30-40 grams per meal. - Use My Fitness Pal to track caloric intake, ensuring not to replace exercise calories. - Consider using ChatGPT to create a tailored meal plan with high protein, low carb, and high fiber. - Discuss potential use of topiramate with psychiatrist for weight management. - Engage in physical activity, aiming for 300 minutes per week, including HIIT and weight lifting. - Weigh herself weekly to monitor weight trends. Abnormal food appetite  She has increased orexigenic signaling, impaired satiety and inhibitory control. This is secondary to an abnormal energy regulation system and pathological neurohormonal pathways characteristic of excess adiposity.  In addition to nutritional and behavioral strategies she benefits from ongoing pharmacotherapy.  She has been on  Wegovy  and is experiencing some weight regain after discontinuation.          Objective   Physical Exam:  Blood pressure 106/72, pulse 81, temperature 97.7 F (36.5 C), height 5' 9 (1.753 m), weight 223 lb (101.2 kg), SpO2 99%. Body mass index is 32.93 kg/m.  General: She is overweight, cooperative, alert, well developed, and in no  acute distress. PSYCH: Has normal mood, affect and thought process.   HEENT: EOMI, sclerae are anicteric. Lungs: Normal breathing effort, no conversational dyspnea. Extremities: No edema.  Neurologic: No gross sensory or motor deficits. No tremors or fasciculations noted.    Diagnostic Data Reviewed:  BMET    Component Value Date/Time   NA 142 06/15/2024 0814   K 5.0 06/15/2024 0814   CL 105 06/15/2024 0814   CO2 21 06/15/2024 0814   GLUCOSE 90 06/15/2024 0814   GLUCOSE 93 07/19/2023 1155   BUN 20 06/15/2024 0814   CREATININE 0.73 06/15/2024 0814   CALCIUM 9.7 06/15/2024 0814   GFRNONAA >60 05/20/2022 0155   GFRAA 101 11/25/2020 0919   Lab Results  Component Value Date   HGBA1C 5.2 01/18/2024   HGBA1C 5.3 12/26/2019   Lab Results  Component Value Date   INSULIN  7.6 01/18/2024   INSULIN  13.6 11/04/2022   Lab Results  Component Value Date   TSH 1.250 01/13/2023   CBC    Component Value Date/Time   WBC 5.0 01/18/2024 0757   WBC 6.7 05/12/2022 0959   RBC 4.96 01/18/2024 0757   RBC 4.85 05/12/2022 0959   HGB 14.5 01/18/2024 0757   HCT 43.7 01/18/2024 0757   PLT 311 01/18/2024 0757   MCV 88 01/18/2024 0757   MCH 29.2 01/18/2024 0757   MCH 29.5 05/12/2022 0959   MCHC 33.2 01/18/2024 0757   MCHC 32.9 05/12/2022 0959   RDW 13.0 01/18/2024 0757   Iron Studies    Component Value Date/Time   IRON 68 11/18/2022 1117   TIBC 329 11/18/2022 1117   FERRITIN 62 11/18/2022 1117   IRONPCTSAT 21 11/18/2022 1117   Lipid Panel     Component Value Date/Time   CHOL 191 01/18/2024 0757   TRIG 106 01/18/2024 0757   HDL 53 01/18/2024 0757   CHOLHDL 4.3 01/13/2023 1344   LDLCALC 119 (H) 01/18/2024 0757   Hepatic Function Panel     Component Value Date/Time   PROT 6.3 06/15/2024 0814   ALBUMIN 4.3 06/15/2024 0814   AST 13 06/15/2024 0814   ALT 14 06/15/2024 0814   ALKPHOS 96 06/15/2024 0814   BILITOT 0.2 06/15/2024 0814   BILIDIR 0.0 11/02/2023 1024       Component Value Date/Time   TSH 1.250 01/13/2023 1344   Nutritional Lab Results  Component Value Date   VD25OH 49.8 04/19/2023   VD25OH 21.8 (L) 11/04/2022    Medications: Outpatient Encounter Medications as of 08/15/2024  Medication Sig   Cholecalciferol (VITAMIN D3) 50 MCG (2000 UT) capsule Take 1 capsule (2,000 Units total) by mouth daily.   citalopram  (CELEXA ) 20 MG tablet Take 1 tablet (20 mg total) by mouth daily.   diclofenac  Sodium (VOLTAREN ) 1 % GEL Apply 4 g topically 4 (four) times daily.   ergocalciferol  (VITAMIN D2) 1.25 MG (50000 UT) capsule Take 1 capsule (50,000 Units total) by mouth once a week.   lamoTRIgine  (LAMICTAL ) 200 MG tablet Take 1 tablet (200 mg total) by mouth daily.   metFORMIN  (GLUCOPHAGE -XR) 500 MG 24 hr tablet Take 2 tablets (1,000  mg total) by mouth 2 (two) times daily with a meal.   Multiple Vitamins-Minerals (MULTIVITAMIN WITH MINERALS) tablet Take 1 tablet by mouth daily.   temazepam  (RESTORIL ) 15 MG capsule Take 1 capsule (15 mg total) by mouth at bedtime as needed for sleep.   [DISCONTINUED] semaglutide -weight management (WEGOVY ) 2.4 MG/0.75ML SOAJ SQ injection Inject 2.4 mg into the skin once a week.   hydrOXYzine  (VISTARIL ) 50 MG capsule Take 1 capsule (50 mg total) by mouth 2 (two) times daily as needed for anxiety. (Patient not taking: Reported on 08/15/2024)   No facility-administered encounter medications on file as of 08/15/2024.     Follow-Up   Return in about 4 weeks (around 09/12/2024) for For Weight Mangement with Dr. Francyne.SABRA She was informed of the importance of frequent follow up visits to maximize her success with intensive lifestyle modifications for her multiple health conditions.  Attestation Statement   Reviewed by clinician on day of visit: allergies, medications, problem list, medical history, surgical history, family history, social history, and previous encounter notes.   I have spent 41 minutes in the care of the  patient today including: 2 minutes before the visit reviewing and preparing the chart. 31 minutes face-to-face assessing and reviewing listed medical problems as outlined in obesity care plan, providing nutritional and behavioral counseling on topics outlined in the obesity care plan, care coordination with other providers : Dr. Arfeen, independently interpreting test results and goals of care, as described in assessment and plan, and reviewing and discussing biometric information and progress 8 minutes after the visit updating chart and documentation of encounter.    Lucas Francyne, MD

## 2024-08-15 NOTE — Assessment & Plan Note (Signed)
 She has increased orexigenic signaling, impaired satiety and inhibitory control. This is secondary to an abnormal energy regulation system and pathological neurohormonal pathways characteristic of excess adiposity.  In addition to nutritional and behavioral strategies she benefits from ongoing pharmacotherapy.  She has been on Wegovy  and is experiencing some weight regain after discontinuation.

## 2024-08-15 NOTE — Assessment & Plan Note (Signed)
 Patient is losing the benefits of GLP-1 treatment therefore increasing her risk of future complications.  She will continue to work on reducing saturated fats simple and added sugars in her diet.

## 2024-08-15 NOTE — Assessment & Plan Note (Signed)
 We will discuss with her psychiatrist about the use of topiramate.  We will avoid sympathomimetics at these are relatively contraindicated.

## 2024-08-18 ENCOUNTER — Telehealth (HOSPITAL_COMMUNITY): Admitting: Psychiatry

## 2024-08-25 ENCOUNTER — Encounter (INDEPENDENT_AMBULATORY_CARE_PROVIDER_SITE_OTHER): Payer: Self-pay | Admitting: Internal Medicine

## 2024-08-25 ENCOUNTER — Telehealth (HOSPITAL_COMMUNITY): Admitting: Psychiatry

## 2024-08-25 ENCOUNTER — Encounter (HOSPITAL_COMMUNITY): Payer: Self-pay | Admitting: Psychiatry

## 2024-08-25 VITALS — Wt 223.0 lb

## 2024-08-25 DIAGNOSIS — F99 Mental disorder, not otherwise specified: Secondary | ICD-10-CM | POA: Diagnosis not present

## 2024-08-25 DIAGNOSIS — F319 Bipolar disorder, unspecified: Secondary | ICD-10-CM | POA: Diagnosis not present

## 2024-08-25 DIAGNOSIS — F419 Anxiety disorder, unspecified: Secondary | ICD-10-CM | POA: Diagnosis not present

## 2024-08-25 DIAGNOSIS — F902 Attention-deficit hyperactivity disorder, combined type: Secondary | ICD-10-CM

## 2024-08-25 DIAGNOSIS — F5105 Insomnia due to other mental disorder: Secondary | ICD-10-CM

## 2024-08-25 MED ORDER — CITALOPRAM HYDROBROMIDE 20 MG PO TABS: 20.0000 mg | ORAL_TABLET | Freq: Every day | ORAL | 2 refills | Status: DC

## 2024-08-25 MED ORDER — LAMOTRIGINE 200 MG PO TABS: 200.0000 mg | ORAL_TABLET | Freq: Every day | ORAL | 2 refills | Status: DC

## 2024-08-25 MED ORDER — TEMAZEPAM 15 MG PO CAPS: 15.0000 mg | ORAL_CAPSULE | Freq: Every evening | ORAL | 2 refills | Status: DC | PRN

## 2024-08-25 NOTE — Progress Notes (Signed)
 [facility] MD Virtual Progress Note   Patient Location: Home Provider Location: Home Office  I connect with patient by video and verified that I am speaking with correct person by using two identifiers. I discussed the limitations of evaluation and management by telemedicine and the availability of in person appointments. I also discussed with the patient that there may be a patient responsible charge related to this service. The patient expressed understanding and agreed to proceed.  Sarah Salas [id number] 48 y.o.  08/25/2024 11:20 AM  History of Present Illness:  Patient is evaluated by video session.  She reported frustration with weight gain and despite 30 minutes exercise and regular walking and watching her calories is still not able to lose weight.  Her insurance did not approve Wegovy .  Her PCP trying different options.  She feels otherwise her psychiatric medication is working very well.  She denies any mania, psychosis, hallucination.  She sleeps okay with the temazepam .  She has no rash, itching tremors or shakes.  She denies any panic attack.  She continues to get distracted during conversation but her ADHD symptoms are manageable.  Patient told she has to cook on upcoming Thanksgiving family dinner but she is okay with it.  She had a good support from her boyfriend.  She denies drinking or using any illegal substances.  She is compliant with Celexa , Lamictal  and temazepam .  Past Psychiatric History: H/O ADHD, bipolar disorder. Vyvanse , Strattera , Hydroxyzine , Serax  and Trazodone  did not help.  Adderall helped.  Latuda  helped in the beginning but then got worse with increased dose.  Abilify  and gabapentin  caused weight gain.  History of high liver enzymes.  No h/o paranoia, inpatient treatment or suicidal attempt. H/O TBI.    Past Medical History:  Diagnosis Date   ADHD (attention deficit hyperactivity disorder)    Allergy    Normal allergies   Anxiety     Arthritis    Bipolar 1 disorder (HCC)    Brain injury (HCC) 2005   Glaucoma    Hyperlipidemia    Hypertension    no longer treated    Outpatient Encounter Medications as of 08/25/2024  Medication Sig   Cholecalciferol (VITAMIN D3) 50 MCG (2000 UT) capsule Take 1 capsule (2,000 Units total) by mouth daily.   citalopram  (CELEXA ) 20 MG tablet Take 1 tablet (20 mg total) by mouth daily.   diclofenac  Sodium (VOLTAREN ) 1 % GEL Apply 4 g topically 4 (four) times daily.   ergocalciferol  (VITAMIN D2) 1.25 MG (50000 UT) capsule Take 1 capsule (50,000 Units total) by mouth once a week.   hydrOXYzine  (VISTARIL ) 50 MG capsule Take 1 capsule (50 mg total) by mouth 2 (two) times daily as needed for anxiety. (Patient not taking: Reported on 08/15/2024)   lamoTRIgine  (LAMICTAL ) 200 MG tablet Take 1 tablet (200 mg total) by mouth daily.   metFORMIN  (GLUCOPHAGE -XR) 500 MG 24 hr tablet Take 2 tablets (1,000 mg total) by mouth 2 (two) times daily with a meal.   Multiple Vitamins-Minerals (MULTIVITAMIN WITH MINERALS) tablet Take 1 tablet by mouth daily.   temazepam  (RESTORIL ) 15 MG capsule Take 1 capsule (15 mg total) by mouth at bedtime as needed for sleep.   No facility-administered encounter medications on file as of 08/25/2024.    Recent Results (from the past 2160 hours)  CMP14+EGFR     Status: Abnormal   Collection Time: 06/14/24  8:09 AM  Result Value Ref Range   Glucose 89 70 - 99 mg/dL  BUN 15 6 - 24 mg/dL   Creatinine, Ser 9.16 0.57 - 1.00 mg/dL   eGFR 88 >40 fO/fpw/8.26   BUN/Creatinine Ratio 18 9 - 23   Sodium 143 134 - 144 mmol/L   Potassium 5.8 (H) 3.5 - 5.2 mmol/L   Chloride 104 96 - 106 mmol/L   CO2 23 20 - 29 mmol/L   Calcium 10.3 (H) 8.7 - 10.2 mg/dL   Total Protein 7.0 6.0 - 8.5 g/dL   Albumin 4.7 3.9 - 4.9 g/dL   Globulin, Total 2.3 1.5 - 4.5 g/dL   Bilirubin Total 0.4 0.0 - 1.2 mg/dL   Alkaline Phosphatase 104 44 - 121 IU/L    Comment: **Effective June 26, 2024 Alkaline  Phosphatase**   reference interval will be changing to:              Age                Female          Female           0 -  5 days         47 - 127       47 - 127           6 - 10 days         29 - 242       29 - 242          11 - 20 days        109 - 357      109 - 357          21 - 30 days         94 - 494       94 - 494           1 -  2 months      149 - 539      149 - 539           3 -  6 months      131 - 452      131 - 452           7 - 11 months      117 - 401      117 - 401   12 months -  6 years       158 - 369      158 - 369           7 - 12 years       150 - 409      150 - 409               13 years       156 - 435       78 - 227               14 years       114 - 375       64 - 161               15 years        88 - 279       56 - 134               16 years        74 - 207       51 - 121  17 years        63 - 161       47 - 113          18 - 20 years        51 - 125       42 - 106          21 - 50 years         47 - 123       41 - 116          51 - 80 years        49 - 135       51 - 125              >80 years        48 - 129       48 - 129    AST 17 0 - 40 IU/L   ALT 17 0 - 32 IU/L  CMP14+EGFR     Status: Abnormal   Collection Time: 06/15/24  8:14 AM  Result Value Ref Range   Glucose 90 70 - 99 mg/dL   BUN 20 6 - 24 mg/dL   Creatinine, Ser 9.26 0.57 - 1.00 mg/dL   eGFR 896 >40 fO/fpw/8.26   BUN/Creatinine Ratio 27 (H) 9 - 23   Sodium 142 134 - 144 mmol/L   Potassium 5.0 3.5 - 5.2 mmol/L   Chloride 105 96 - 106 mmol/L   CO2 21 20 - 29 mmol/L   Calcium 9.7 8.7 - 10.2 mg/dL   Total Protein 6.3 6.0 - 8.5 g/dL   Albumin 4.3 3.9 - 4.9 g/dL   Globulin, Total 2.0 1.5 - 4.5 g/dL   Bilirubin Total 0.2 0.0 - 1.2 mg/dL   Alkaline Phosphatase 96 44 - 121 IU/L    Comment: **Effective June 26, 2024 Alkaline Phosphatase**   reference interval will be changing to:              Age                Female          Female           0 -  5 days         47 - 127        47 - 127           6 - 10 days         29 - 242       29 - 242          11 - 20 days        109 - 357      109 - 357          21 - 30 days         94 - 494       94 - 494           1 -  2 months      149 - 539      149 - 539           3 -  6 months      131 - 452      131 - 452           7 - 11 months      117 - 401      117 - 401  12 months -  6 years       158 - 369      158 - 369           7 - 12 years       150 - 409      150 - 409               13 years       156 - 435       78 - 227               14 years       114 - 375       64 - 161               15 years        88 - 279       56 - 134               16 years        74 - 207       51 - 121               17 years        63 - 161       47 - 113          18 - 20 years        51 - 125       42 - 106          21 - 50 years         47 - 123       41 - 116          51 - 80 years        49 - 135       51 - 125              >80 years        48 - 129       48 - 129    AST 13 0 - 40 IU/L   ALT 14 0 - 32 IU/L     Psychiatric Specialty Exam: Physical Exam  Review of Systems  Weight 223 lb (101.2 kg).There is no height or weight on file to calculate BMI.  General Appearance: Casual  Eye Contact:  Good  Speech:  fast  Volume:  Normal  Mood:  Anxious  Affect:  Labile  Thought Process:  Descriptions of Associations: Intact  Orientation:  Full (Time, Place, and Person)  Thought Content:  Rumination  Suicidal Thoughts:  No  Homicidal Thoughts:  No  Memory:  Immediate;   Good Recent;   Fair Remote;   Fair  Judgement:  Fair  Insight:  Shallow  Psychomotor Activity:  Increased  Concentration:  Concentration: Fair and Attention Span: Fair  Recall:  [employer] of Knowledge:  Fair  Language:  Good  Akathisia:  No  Handed:  Right  AIMS (if indicated):     Assets:  Communication Skills Desire for Improvement Social Support Transportation  ADL's:  Intact  Cognition:  WNL  Sleep:  ok with Temazepam         01/25/2024    9:28  AM 01/13/2023   10:33 AM 11/22/2018   10:23 AM 08/16/2018   10:47 AM 05/18/2018    9:00 AM  Depression screen PHQ 2/9  Decreased Interest 0 0 0 0 0  Down, Depressed, Hopeless 0 0 0 0 0  PHQ - 2 Score 0 0 0 0 0  Altered sleeping 0 0 0 0 0  Tired, decreased energy 0 0 0 0 0  Change in appetite 0 0 0 0 0  Feeling bad or failure about yourself  0 0 0 0 0  Trouble concentrating 0 0 0 0 0  Moving slowly or fidgety/restless 0 0 0 0 0  Suicidal thoughts 0 0 0 0 0  PHQ-9 Score 0  0  0  0  0   Difficult doing work/chores Not difficult at all         Data saved with a previous flowsheet row definition    Assessment/Plan: Bipolar I disorder (HCC) - Plan: citalopram  (CELEXA ) 20 MG tablet, lamoTRIgine  (LAMICTAL ) 200 MG tablet, temazepam  (RESTORIL ) 15 MG capsule  Anxiety - Plan: citalopram  (CELEXA ) 20 MG tablet, lamoTRIgine  (LAMICTAL ) 200 MG tablet, temazepam  (RESTORIL ) 15 MG capsule  Attention deficit hyperactivity disorder (ADHD), combined type - Plan: citalopram  (CELEXA ) 20 MG tablet, lamoTRIgine  (LAMICTAL ) 200 MG tablet  Insomnia due to other mental disorder - Plan: citalopram  (CELEXA ) 20 MG tablet, temazepam  (RESTORIL ) 15 MG capsule  Patient is a 48 year old Caucasian female with history of ADHD, bipolar disorder, insomnia and anxiety.  Currently stable on temazepam , Lamictal  and Celexa .  She is frustrated with weight gain.  Given the history of high liver enzymes and not able to go forward the Wegovy , she tried watching her calorie intake and exercise but limited improvement.  Her PCP wants her to try on Topamax which is okay but can cause sedation and attention problems.  I am okay if PCP wants to try the Topamax but if she started to feeling sleepy, groggy and attention issues then she may need to stop.  Patient is well aware about her illness.  I will also forward my note to her PCP.  I also discussed possible going through menopause which could be challenging for weight loss.  Encouraged to  contact OB/GYN to check her hormones level.  Recommend to call back if she is any question or any concern.  Follow-up in 3 months.   Follow Up Instructions:     I discussed the assessment and treatment plan with the patient. The patient was provided an opportunity to ask questions and all were answered. The patient agreed with the plan and demonstrated an understanding of the instructions.   The patient was advised to call back or seek an in-person evaluation if the symptoms worsen or if the condition fails to improve as anticipated.    Collaboration of Care: Other provider involved in patient's care AEB notes are available in epic to review  Patient/Guardian was advised Release of Information must be obtained prior to any record release in order to collaborate their care with an outside provider. Patient/Guardian was advised if they have not already done so to contact the registration department to sign all necessary forms in order for us  to release information regarding their care.   Consent: Patient/Guardian gives verbal consent for treatment and assignment of benefits for services provided during this visit. Patient/Guardian expressed understanding and agreed to proceed.     Total encounter time 24 minutes which includes face-to-face time, chart reviewed, care coordination, order entry and documentation during this encounter.   Note: This document was prepared by [employer] voice dictation technology and any errors that results from this process are unintentional.    Leni ONEIDA Client, MD 08/25/2024

## 2024-08-28 ENCOUNTER — Other Ambulatory Visit (INDEPENDENT_AMBULATORY_CARE_PROVIDER_SITE_OTHER): Payer: Self-pay | Admitting: Internal Medicine

## 2024-08-28 DIAGNOSIS — Z6831 Body mass index (BMI) 31.0-31.9, adult: Secondary | ICD-10-CM

## 2024-08-29 ENCOUNTER — Other Ambulatory Visit (INDEPENDENT_AMBULATORY_CARE_PROVIDER_SITE_OTHER): Payer: Self-pay

## 2024-08-29 ENCOUNTER — Other Ambulatory Visit: Payer: Self-pay | Admitting: Nurse Practitioner

## 2024-08-29 ENCOUNTER — Encounter (INDEPENDENT_AMBULATORY_CARE_PROVIDER_SITE_OTHER): Payer: Self-pay | Admitting: Internal Medicine

## 2024-08-29 DIAGNOSIS — E88819 Insulin resistance, unspecified: Secondary | ICD-10-CM

## 2024-08-29 DIAGNOSIS — E66812 Obesity, class 2: Secondary | ICD-10-CM

## 2024-08-29 MED ORDER — TOPIRAMATE 25 MG PO TABS: 25.0000 mg | ORAL_TABLET | Freq: Two times a day (BID) | ORAL | 0 refills | Status: DC

## 2024-08-29 MED ORDER — METFORMIN HCL ER 500 MG PO TB24: 500.0000 mg | ORAL_TABLET | Freq: Two times a day (BID) | ORAL | 0 refills | Status: DC

## 2024-09-01 ENCOUNTER — Other Ambulatory Visit (INDEPENDENT_AMBULATORY_CARE_PROVIDER_SITE_OTHER): Payer: Self-pay | Admitting: Internal Medicine

## 2024-09-01 ENCOUNTER — Encounter (INDEPENDENT_AMBULATORY_CARE_PROVIDER_SITE_OTHER): Payer: Self-pay | Admitting: Internal Medicine

## 2024-09-01 DIAGNOSIS — E559 Vitamin D deficiency, unspecified: Secondary | ICD-10-CM

## 2024-09-03 ENCOUNTER — Encounter: Payer: Self-pay | Admitting: Nurse Practitioner

## 2024-09-04 ENCOUNTER — Ambulatory Visit: Payer: Self-pay

## 2024-09-04 NOTE — Telephone Encounter (Signed)
 FYI Only or Action Required?: Action required by provider: update on patient condition and new medication request, refused urgent care.  Patient was last seen in primary care on 08/15/2024 by Francyne Romano, MD.  Called Nurse Triage reporting Back Pain.  Symptoms began yesterday.  Interventions attempted: OTC medications: Tylenol  and Rest, hydration, or home remedies.  Symptoms are: unchanged.  Triage Disposition: See HCP Within 4 Hours (Or PCP Triage)  Patient/caregiver understands and will follow disposition?: No, wishes to speak with PCP   Copied from CRM #[id number]. Topic: Clinical - Red Word Triage >> Sep 04, 2024  9:56 AM Winona R wrote: Pt had some existing chronic back pain and went to reach for something yesterday and feels like she pulled a muscle which caused severe pain. Pt is asking for muscle relaxer, and ibuprofen  800 mlg Reason for Disposition  [1] SEVERE back pain (e.g., excruciating, unable to do any normal activities) AND [2] not improved 2 hours after pain medicine  Answer Assessment - Initial Assessment Questions Additional info: 1) Message sent on MyChart, no response at this time.   2) Refused urgent care, wants medication called in to [facility]. She states that Zelda will normally send in medication without appointment.   3) Requesting ibuprophen 800mg  and muscle relaxer.    1. ONSET: When did the pain begin? (e.g., minutes, hours, days)     Chronic but feels pulled muscle yesterday 2. LOCATION: Where does it hurt? (upper, mid or lower back)     Lower 3. SEVERITY: How bad is the pain?  (e.g., Scale 1-10; mild, moderate, or severe)     severe 4. PATTERN: Is the pain constant? (e.g., yes, no; constant, intermittent)      Constant  5. RADIATION: Does the pain shoot into your legs or somewhere else?     Denies  6. CAUSE:  What do you think is causing the back pain?      Pulled muscle 7. BACK OVERUSE:  Any recent lifting of heavy  objects, strenuous work or exercise?     overstretched 8. MEDICINES: What have you taken so far for the pain? (e.g., nothing, acetaminophen , NSAIDS)     Tylenol  without effect.  9. NEUROLOGIC SYMPTOMS: Do you have any weakness, numbness, or problems with bowel/bladder control?     Denies  10. OTHER SYMPTOMS: Do you have any other symptoms? (e.g., fever, abdomen pain, burning with urination, blood in urine)      Denies  Protocols used: Back Pain-A-AH

## 2024-09-05 ENCOUNTER — Other Ambulatory Visit: Payer: Self-pay | Admitting: Nurse Practitioner

## 2024-09-05 ENCOUNTER — Encounter (INDEPENDENT_AMBULATORY_CARE_PROVIDER_SITE_OTHER): Payer: Self-pay | Admitting: Internal Medicine

## 2024-09-05 DIAGNOSIS — M545 Low back pain, unspecified: Secondary | ICD-10-CM

## 2024-09-05 MED ORDER — METHOCARBAMOL 500 MG PO TABS: 500.0000 mg | ORAL_TABLET | Freq: Three times a day (TID) | ORAL | 0 refills | Status: DC | PRN

## 2024-09-05 MED ORDER — IBUPROFEN 800 MG PO TABS: 800.0000 mg | ORAL_TABLET | Freq: Three times a day (TID) | ORAL | 0 refills | Status: DC | PRN

## 2024-09-12 ENCOUNTER — Ambulatory Visit (INDEPENDENT_AMBULATORY_CARE_PROVIDER_SITE_OTHER): Payer: Self-pay | Admitting: Internal Medicine

## 2024-09-12 ENCOUNTER — Encounter (INDEPENDENT_AMBULATORY_CARE_PROVIDER_SITE_OTHER): Payer: Self-pay | Admitting: Internal Medicine

## 2024-09-12 ENCOUNTER — Other Ambulatory Visit (INDEPENDENT_AMBULATORY_CARE_PROVIDER_SITE_OTHER): Payer: Self-pay | Admitting: Internal Medicine

## 2024-09-12 VITALS — BP 120/78 | HR 80 | Temp 98.1°F | Ht 69.0 in | Wt 228.0 lb

## 2024-09-12 DIAGNOSIS — E7800 Pure hypercholesterolemia, unspecified: Secondary | ICD-10-CM

## 2024-09-12 DIAGNOSIS — K760 Fatty (change of) liver, not elsewhere classified: Secondary | ICD-10-CM | POA: Diagnosis not present

## 2024-09-12 DIAGNOSIS — E66811 Obesity, class 1: Secondary | ICD-10-CM | POA: Diagnosis not present

## 2024-09-12 DIAGNOSIS — E88819 Insulin resistance, unspecified: Secondary | ICD-10-CM | POA: Diagnosis not present

## 2024-09-12 DIAGNOSIS — Z6833 Body mass index (BMI) 33.0-33.9, adult: Secondary | ICD-10-CM | POA: Diagnosis not present

## 2024-09-12 MED ORDER — TOPIRAMATE 25 MG PO TABS: 25.0000 mg | ORAL_TABLET | Freq: Two times a day (BID) | ORAL | 0 refills | Status: DC

## 2024-09-12 MED ORDER — WEGOVY 0.25 MG/0.5ML ~~LOC~~ SOAJ: 0.2500 mg | SUBCUTANEOUS | 0 refills | Status: DC

## 2024-09-12 NOTE — Assessment & Plan Note (Signed)
 From a peak weight of 450 pounds she has lost 222 pounds over the course of her journey, 63 of those pounds or 23% aided by Wegovy .  There has been improvement in MASLD and also insulin  resistance.  She has also increased mobility and has been exercising vigorously.  She also has good adherence to calorie restricted diet.  From a mental health perspective medication have also helped improve her self-confidence and sense of wellbeing.  She had to come off medication abruptly due to changes in coverage.  Medication had also had a profound positive effect on her mental health and sense of wellbeing.  She is now emotionally distraught as she fears progressive weight gain and deterioration in her health.  Patient benefits from ongoing treatment from Wegovy  as she remains high risk for weight regain and future complications.  It would also be very difficult for her to maintain her weight in the presence of weight promoting psychotropic medication and other psychosocial economical factors.

## 2024-09-12 NOTE — Progress Notes (Signed)
 Office: [phone]  /  Fax: [phone]  Weight Summary and Body Composition Analysis (BIA)  Vitals Temp: 98.1 F (36.7 C) BP: 120/78 Pulse Rate: 80 SpO2: 99 %   Anthropometric Measurements Height: 5' 9 (1.753 m) Weight: 228 lb (103.4 kg) BMI (Calculated): 33.65 Weight at Last Visit: 223 lb Weight Lost Since Last Visit: 0 lb Weight Gained Since Last Visit: 5 lb Starting Weight: 268 lb Total Weight Loss (lbs): 40 lb (18.1 kg) Peak Weight: 450 lb   Body Composition  Body Fat %: 42.6 % Fat Mass (lbs): 97.4 lbs Muscle Mass (lbs): 124.8 lbs Total Body Water (lbs): 84.8 lbs Visceral Fat Rating : 10    RMR: 2102  Today's Visit #: [id number]  Starting Date: 08/28/22   Subjective   Chief Complaint: Obesity  Interval History Discussed the use of AI scribe software for clinical note transcription with the patient, who gave verbal consent to proceed.  History of Present Illness Sarah Salas is a 48 year old female who presents for weight management.  Since last office visit she has gained 5 pounds she reports following a 1200-calorie target about 75% of the time she is exercising 5 days a week 180 minutes.  She has been taking topiramate  twice daily, in the morning and at night, for an unspecified duration and requires a refill. No significant changes in hypomanic symptoms or adverse effects like increased sleepiness have been noted. Sleep duration has improved slightly, now exceeding six hours, though not reaching her previous seven-hour norm.  She reports significant stress related to family responsibilities and financial constraints, exacerbated by the holiday season. She feels overwhelmed by her role in her family, including driving her sister and niece, managing holiday preparations, and financial burdens. She describes her current life situation as 'sucks right now' and feels underappreciated by her family. A history of a brain injury contributes to her feeling  overwhelmed and unable to maintain employment.  She has experienced weight gain. Attempts to increase physical activity by walking and weightlifting were halted due to a back injury. She reports gaining muscle mass but also fat, and her back pain persists, affecting her ability to function.  She is dealing with financial difficulties, receiving less financial support than expected, and is concerned about managing expenses, especially with the upcoming holidays. She relies on her boyfriend for emotional support, describing him as her 'rock' and 'soulmate'.     Challenges affecting patient progress: lack of partner or family support, moderate to high levels of stress, emotional eating, and having difficulties with GLP-1 or AOM coverage.    Pharmacotherapy for weight management: She is currently taking no anti-obesity medication and patient recently discontinued Wegovy  due to lack of insurance coverage and experiencing weight regain.   Assessment and Plan   Treatment Plan For Obesity:  Recommended Dietary Goals  [name] is currently in the action stage of change. As such, her goal is to continue weight management plan. She has agreed to: continue current plan  Behavioral Health and Counseling  We discussed the following behavioral modification strategies today: work on managing stress, creating time for self-care and relaxation, continue to work on maintaining a reduced calorie state, getting the recommended amount of protein, incorporating whole foods, making healthy choices, staying well hydrated and practicing mindfulness when eating., and increase protein intake, fibrous foods (25 grams per day for women, 30 grams for men) and water to improve satiety and decrease hunger signals. .  Additional education and resources provided today: Handout on  traveling and holiday eating strategies  Recommended Physical Activity Goals  [name] has been advised to work up to 150 minutes of moderate  intensity aerobic activity a week and strengthening exercises 2-3 times per week for cardiovascular health, weight loss maintenance and preservation of muscle mass.  She has agreed to :  Increase volume of physical activity to a goal of 240 minutes a week and Combine aerobic and strengthening exercises for efficiency and improved cardiometabolic health.  Medical Interventions and Pharmacotherapy  We discussed various medication options to help [name] with her weight loss efforts and we both agreed to : We discussed various medication options to help [name] with her weight loss efforts and we both agreed to : start anti-obesity medication.   In addition to reduced calorie nutrition plan (RCNP), behavioral strategies and physical activity, [name] would benefit from pharmacotherapy to assist with hunger signals, satiety and cravings. This will reduce obesity-related health risks by inducing weight loss, and help reduce food consumption and adherence to [facility]). It may also improve QOL by improving self-confidence and reduce the setbacks associated with metabolic adaptations.  He also has high risk comorbidities associated with her obesity including: MASLD with elevated liver enzymes, insulin  resistance, hypercholesterolemia.  All of these conditions increase her risk for future complications and are either improved or potentially reverse through sustained weight loss.  She has been on Wegovy  and had lost a total of 63 pounds or 23% in 1 year and 3 months combined with lifestyle changes.  Since coming off medications she has noticed increased in hunger signals and impaired satiety which are typical metabolic adaptations associated with weight loss and abrupt discontinuation of GLP-1.  She is currently on metformin  and has contraindications to sympathomimetics because of bipolar disorder.  She was recently started on topiramate  25 mg twice daily off-label but have not noticed an impact on  appetite.  Patient is also under significant emotional stress due to family dynamics and also fear of gaining weight.  Her weight loss has been significant and life-changing and had had a positive effect on her mental health and sense of wellbeing.    Given Syniyah's clinical profile--GLP-1 therapy is both indicated and expected to provide multifactorial benefit. The medication's mechanism aligns precisely with the patient's needs and addresses the physiologic underpinnings of weight gain.   After a detailed discussion covering treatment rationale, mechanism of action, expected outcomes, risks, and long-term use considerations, shared decision-making was used to reinitiate Wegovy .  The importance of ongoing lifestyle management and long-term adherence was emphasized, as was the potential for weight regain following discontinuation.  Ongoing monitoring and follow-up are planned to assess tolerability, clinical response, and reinforce behavioral strategies.   Associated Conditions Impacted by Obesity Treatment  Assessment & Plan Metabolic dysfunction-associated steatotic liver disease (MASLD) Pure hypercholesterolemia Class 1 obesity with serious comorbidity and body mass index (BMI) of 33.0 to 33.9 in adult, unspecified obesity type Insulin  resistance From a peak weight of 450 pounds she has lost 222 pounds over the course of her journey, 63 of those pounds or 23% aided by Wegovy .  There has been improvement in MASLD and also insulin  resistance.  She has also increased mobility and has been exercising vigorously.  She also has good adherence to calorie restricted diet.  From a mental health perspective medication have also helped improve her self-confidence and sense of wellbeing.  She had to come off medication abruptly due to changes in coverage.  Medication had also had a profound positive effect  on her mental health and sense of wellbeing.  She is now emotionally distraught as she fears  progressive weight gain and deterioration in her health.  Patient benefits from ongoing treatment from Wegovy  as she remains high risk for weight regain and future complications.  It would also be very difficult for her to maintain her weight in the presence of weight promoting psychotropic medication and other psychosocial economical factors.          Objective   Physical Exam:  Blood pressure 120/78, pulse 80, temperature 98.1 F (36.7 C), height 5' 9 (1.753 m), weight 228 lb (103.4 kg), SpO2 99%. Body mass index is 33.67 kg/m.  General: She is overweight, cooperative, alert, well developed, and in no acute distress. PSYCH: Has normal mood, affect and thought process.   HEENT: EOMI, sclerae are anicteric. Lungs: Normal breathing effort, no conversational dyspnea. Extremities: No edema.  Neurologic: No gross sensory or motor deficits. No tremors or fasciculations noted.    Diagnostic Data Reviewed:  BMET    Component Value Date/Time   NA 142 06/15/2024 0814   K 5.0 06/15/2024 0814   CL 105 06/15/2024 0814   CO2 21 06/15/2024 0814   GLUCOSE 90 06/15/2024 0814   GLUCOSE 93 07/19/2023 1155   BUN 20 06/15/2024 0814   CREATININE 0.73 06/15/2024 0814   CALCIUM 9.7 06/15/2024 0814   GFRNONAA >60 05/20/2022 0155   GFRAA 101 11/25/2020 0919   Lab Results  Component Value Date   HGBA1C 5.2 01/18/2024   HGBA1C 5.3 12/26/2019   Lab Results  Component Value Date   INSULIN  7.6 01/18/2024   INSULIN  13.6 11/04/2022   Lab Results  Component Value Date   TSH 1.250 01/13/2023   CBC    Component Value Date/Time   WBC 5.0 01/18/2024 0757   WBC 6.7 05/12/2022 0959   RBC 4.96 01/18/2024 0757   RBC 4.85 05/12/2022 0959   HGB 14.5 01/18/2024 0757   HCT 43.7 01/18/2024 0757   PLT 311 01/18/2024 0757   MCV 88 01/18/2024 0757   MCH 29.2 01/18/2024 0757   MCH 29.5 05/12/2022 0959   MCHC 33.2 01/18/2024 0757   MCHC 32.9 05/12/2022 0959   RDW 13.0 01/18/2024 0757   Iron  Studies    Component Value Date/Time   IRON 68 11/18/2022 1117   TIBC 329 11/18/2022 1117   FERRITIN 62 11/18/2022 1117   IRONPCTSAT 21 11/18/2022 1117   Lipid Panel     Component Value Date/Time   CHOL 191 01/18/2024 0757   TRIG 106 01/18/2024 0757   HDL 53 01/18/2024 0757   CHOLHDL 4.3 01/13/2023 1344   LDLCALC 119 (H) 01/18/2024 0757   Hepatic Function Panel     Component Value Date/Time   PROT 6.3 06/15/2024 0814   ALBUMIN 4.3 06/15/2024 0814   AST 13 06/15/2024 0814   ALT 14 06/15/2024 0814   ALKPHOS 96 06/15/2024 0814   BILITOT 0.2 06/15/2024 0814   BILIDIR 0.0 11/02/2023 1024      Component Value Date/Time   TSH 1.250 01/13/2023 1344   Nutritional Lab Results  Component Value Date   VD25OH 49.8 04/19/2023   VD25OH 21.8 (L) 11/04/2022    Medications: Outpatient Encounter Medications as of 09/12/2024  Medication Sig   Cholecalciferol (VITAMIN D3) 50 MCG (2000 UT) capsule Take 1 capsule (2,000 Units total) by mouth daily.   citalopram  (CELEXA ) 20 MG tablet Take 1 tablet (20 mg total) by mouth daily.   diclofenac  Sodium (VOLTAREN )  1 % GEL Apply 4 g topically 4 (four) times daily.   ergocalciferol  (VITAMIN D2) 1.25 MG (50000 UT) capsule Take 1 capsule (50,000 Units total) by mouth once a week.   ibuprofen  (ADVIL ) 800 MG tablet Take 1 tablet (800 mg total) by mouth every 8 (eight) hours as needed.   lamoTRIgine  (LAMICTAL ) 200 MG tablet Take 1 tablet (200 mg total) by mouth daily.   metFORMIN  (GLUCOPHAGE -XR) 500 MG 24 hr tablet Take 1 tablet (500 mg total) by mouth 2 (two) times daily with a meal.   methocarbamol  (ROBAXIN ) 500 MG tablet Take 1 tablet (500 mg total) by mouth every 8 (eight) hours as needed for muscle spasms.   Multiple Vitamins-Minerals (MULTIVITAMIN WITH MINERALS) tablet Take 1 tablet by mouth daily.   semaglutide -weight management (WEGOVY ) 0.25 MG/0.5ML SOAJ SQ injection Inject 0.25 mg into the skin once a week.   temazepam  (RESTORIL ) 15 MG capsule  Take 1 capsule (15 mg total) by mouth at bedtime as needed for sleep.   [DISCONTINUED] topiramate  (TOPAMAX ) 25 MG tablet Take 1 tablet (25 mg total) by mouth 2 (two) times daily.   topiramate  (TOPAMAX ) 25 MG tablet Take 1 tablet (25 mg total) by mouth 2 (two) times daily.   No facility-administered encounter medications on file as of 09/12/2024.   Assessment and Plan    Follow-Up   No follow-ups on file.SABRA She was informed of the importance of frequent follow up visits to maximize her success with intensive lifestyle modifications for her multiple health conditions.  Attestation Statement   Reviewed by clinician on day of visit: allergies, medications, problem list, medical history, surgical history, family history, social history, and previous encounter notes.      Lucas Parker, MD

## 2024-09-14 ENCOUNTER — Encounter (INDEPENDENT_AMBULATORY_CARE_PROVIDER_SITE_OTHER): Payer: Self-pay | Admitting: Internal Medicine

## 2024-09-14 MED ORDER — ONDANSETRON 4 MG PO TBDP: 4.0000 mg | ORAL_TABLET | Freq: Three times a day (TID) | ORAL | 0 refills | Status: DC | PRN

## 2024-09-14 NOTE — Addendum Note (Signed)
 Addended by: MERRILL PLANAS A on: 09/14/2024 03:38 PM   Modules accepted: Orders

## 2024-09-25 ENCOUNTER — Other Ambulatory Visit (INDEPENDENT_AMBULATORY_CARE_PROVIDER_SITE_OTHER): Payer: Self-pay | Admitting: Internal Medicine

## 2024-09-25 ENCOUNTER — Encounter (INDEPENDENT_AMBULATORY_CARE_PROVIDER_SITE_OTHER): Payer: Self-pay | Admitting: Internal Medicine

## 2024-09-25 ENCOUNTER — Other Ambulatory Visit (INDEPENDENT_AMBULATORY_CARE_PROVIDER_SITE_OTHER): Payer: Self-pay

## 2024-09-25 DIAGNOSIS — E559 Vitamin D deficiency, unspecified: Secondary | ICD-10-CM

## 2024-09-25 DIAGNOSIS — E88819 Insulin resistance, unspecified: Secondary | ICD-10-CM

## 2024-09-25 MED ORDER — METFORMIN HCL ER 500 MG PO TB24: 500.0000 mg | ORAL_TABLET | Freq: Two times a day (BID) | ORAL | 0 refills | Status: DC

## 2024-09-25 MED ORDER — VITAMIN D3 50 MCG (2000 UT) PO CAPS: 2000.0000 [IU] | ORAL_CAPSULE | Freq: Every day | ORAL | 1 refills | Status: AC

## 2024-09-30 ENCOUNTER — Encounter (INDEPENDENT_AMBULATORY_CARE_PROVIDER_SITE_OTHER): Payer: Self-pay | Admitting: Internal Medicine

## 2024-10-06 ENCOUNTER — Other Ambulatory Visit (INDEPENDENT_AMBULATORY_CARE_PROVIDER_SITE_OTHER): Payer: Self-pay | Admitting: Internal Medicine

## 2024-10-06 DIAGNOSIS — K760 Fatty (change of) liver, not elsewhere classified: Secondary | ICD-10-CM

## 2024-10-06 DIAGNOSIS — Z6833 Body mass index (BMI) 33.0-33.9, adult: Secondary | ICD-10-CM

## 2024-10-10 ENCOUNTER — Ambulatory Visit (INDEPENDENT_AMBULATORY_CARE_PROVIDER_SITE_OTHER): Admitting: Internal Medicine

## 2024-10-10 ENCOUNTER — Encounter (INDEPENDENT_AMBULATORY_CARE_PROVIDER_SITE_OTHER): Payer: Self-pay | Admitting: Internal Medicine

## 2024-10-10 VITALS — BP 119/78 | HR 69 | Temp 97.7°F | Ht 69.0 in | Wt 229.0 lb

## 2024-10-10 DIAGNOSIS — K760 Fatty (change of) liver, not elsewhere classified: Secondary | ICD-10-CM

## 2024-10-10 DIAGNOSIS — E88819 Insulin resistance, unspecified: Secondary | ICD-10-CM

## 2024-10-10 DIAGNOSIS — E66811 Obesity, class 1: Secondary | ICD-10-CM | POA: Diagnosis not present

## 2024-10-10 DIAGNOSIS — R638 Other symptoms and signs concerning food and fluid intake: Secondary | ICD-10-CM

## 2024-10-10 DIAGNOSIS — Z6833 Body mass index (BMI) 33.0-33.9, adult: Secondary | ICD-10-CM

## 2024-10-10 MED ORDER — METFORMIN HCL ER 500 MG PO TB24: 500.0000 mg | ORAL_TABLET | Freq: Two times a day (BID) | ORAL | 0 refills | Status: AC

## 2024-10-10 MED ORDER — WEGOVY 0.5 MG/0.5ML ~~LOC~~ SOAJ: 0.5000 mg | SUBCUTANEOUS | 0 refills | Status: DC

## 2024-10-10 NOTE — Progress Notes (Signed)
 "  Office: [phone]  /  Fax: [phone]  Weight Summary and Body Composition Analysis (BIA)  Vitals Temp: 97.7 F (36.5 C) BP: 119/78 Pulse Rate: 69 SpO2: 100 %   Anthropometric Measurements Height: 5' 9 (1.753 m) Weight: 229 lb (103.9 kg) BMI (Calculated): 33.8 Weight at Last Visit: 228 lb Weight Lost Since Last Visit: 0 Weight Gained Since Last Visit: 1 lb Starting Weight: 268 lb Total Weight Loss (lbs): 39 lb (17.7 kg) Peak Weight: 450 lb   Body Composition  Body Fat %: 42.5 % Fat Mass (lbs): 97.4 lbs Muscle Mass (lbs): 125.4 lbs Total Body Water (lbs): 84.2 lbs Visceral Fat Rating : 10    RMR: 2102  Today's Visit #: 28  Starting Date: 08/28/22   Subjective   Chief Complaint: Obesity  Interval History  Discussed the use of AI scribe software for clinical note transcription with the patient, who gave verbal consent to proceed.  History of Present Illness Sarah Salas is a 48 year old female with obesity and insulin  resistance who presents for medical weight management.  She has gained one pound since her last visit. She follows a 1200 calorie nutrition plan with fair adherence and exercises five days a week, engaging in 90 minutes of cardio and strengthening exercises. She had previously been on Wegovy  but had to discontinue it due to changes in Medicaid. She is now back on Wegovy , taking it once a week. She had been prescribed topiramate  but stopped due to side effects.  She has a history of insulin  resistance and fatty liver. She is currently taking metformin  but anticipates running out before her next visit. She is concerned about her eating habits, particularly during December, and aims to avoid gaining more than ten pounds. 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  She has a history of significant weight loss, having lost about 50% of her total  body weight from a peak of 450 pounds. After discontinuing Wegovy , she experienced a weight regain of about 23 pounds. She is motivated to continue her weight loss journey and aims to be under 200 pounds.  She mentions a family history of diabetes, as her boyfriend has diabetes, and she is aware of her own insulin  resistance.  She discusses her exercise routine, noting that she used to walk five and a half miles but experienced joint pain. She plans to switch to using a treadmill to reduce impact on her joints. She is considering joining a new gym that offers more affordable membership options.  No nausea while on Wegovy .     Challenges affecting patient progress: all-or- none mindset and weight-centric mindset.    Pharmacotherapy for weight management: She is currently taking Wegovy  with adequate clinical response  and without side effects..   Assessment and Plan   Treatment Plan For Obesity:  Recommended Dietary Goals  Any is currently in the action stage of change. As such, her goal is to continue weight management plan. She has agreed to: follow the Category 2 plan - 1200 kcal per day  Behavioral Health and Counseling  We discussed the following behavioral modification strategies today: continue to work on maintaining a reduced calorie state, getting the recommended amount of protein, incorporating whole foods, making healthy choices, staying well hydrated and practicing mindfulness when eating. and increase protein intake, fibrous foods (25 grams per day for women, 30 grams for men) and water to improve satiety  and decrease hunger signals. .  Additional education and resources provided today: None  Recommended Physical Activity Goals  Sarah Salas has been advised to work up to 150 minutes of moderate intensity aerobic activity a week and strengthening exercises 2-3 times per week for cardiovascular health, weight loss maintenance and preservation of muscle mass.  She has agreed to  :  Continue current level of physical activity   Medical Interventions and Pharmacotherapy  We discussed various medication options to help Sarah Salas with her weight loss efforts and we both agreed to : Increase Wegovy  to .5mg  once a week and Patient was counseled on the importance of maintaining healthy lifestyle habits, including balanced nutrition, regular physical activity, and behavioral modifications, while taking antiobesity medication.  Patient verbalized understanding that medication is an adjunct to, not a replacement for, lifestyle changes and that the long-term success and weight maintenance depend on continued adherence to these strategies.  Associated Conditions Impacted by Obesity Treatment  Assessment & Plan Metabolic dysfunction-associated steatotic liver disease (MASLD)  Class 1 obesity with serious comorbidity and body mass index (BMI) of 33.0 to 33.9 in adult, unspecified obesity type  Abnormal food appetite  Insulin  resistance     Assessment and Plan Assessment & Plan Class 1 obesity with serious comorbidity Class 1 obesity with significant weight loss history. Current weight is 229 lbs, with a peak weight of 450 lbs. Lost approximately 63 lbs (23.5%) after discontinuing Wegovy  due to Medicaid changes, with a subsequent weight regain of 23 lbs (11.2%). Currently back on Wegovy , which is well-tolerated without nausea. Engaged in a 1200 calorie, low carb, moderate protein nutrition plan and exercises five days a week, exceeding 415 minutes per week. Discussed the importance of maintaining a healthy lifestyle rather than focusing solely on weight loss. Emphasized that a higher BMI does not necessarily equate to poor health and that weight loss may slow as fat mass decreases. - Continue Wegovy , increase to 0.5 mg as tolerated - Continue 1200 calorie, low carb, moderate protein nutrition plan - Maintain current levels of physical activity, including combined endurance and  strengthening exercises  Metabolic dysfunction-associated steatotic liver disease (MASLD) MASLD associated with insulin  resistance. Wegovy  is beneficial for MASLD and insulin  resistance. Metformin  is also used to manage insulin  resistance and prevent weight gain associated with psychiatric medications. - Continue Wegovy  for MASLD management - Continue metformin  for insulin  resistance and MASLD management  Insulin  resistance Elevated insulin  levels. No current diagnosis of prediabetes or diabetes. Metformin  is used to manage insulin  resistance and prevent progression to diabetes. Wegovy  also aids in managing insulin  resistance. - Continue metformin  for insulin  resistance management - Continue Wegovy  for insulin  resistance management  Abnormal food appetite Abnormal food appetite, particularly emotional eating. Discussed strategies to manage emotional hunger, such as substituting unhealthy foods with healthier options like fruits. Emphasized the importance of not having trigger foods available to prevent emotional eating. - Encouraged substitution of unhealthy foods with healthier options like fruits - Advised on strategies to manage emotional hunger and avoid trigger foods  Recording duration: 16 minutes      Objective   Physical Exam:  Blood pressure 119/78, pulse 69, temperature 97.7 F (36.5 C), height 5' 9 (1.753 m), weight 229 lb (103.9 kg), SpO2 100%. Body mass index is 33.82 kg/m.  General: She is overweight, cooperative, alert, well developed, and in no acute distress. PSYCH: Has normal mood, affect and thought process.   HEENT: EOMI, sclerae are anicteric. Lungs: Normal breathing effort, no conversational dyspnea. Extremities:  No edema.  Neurologic: No gross sensory or motor deficits. No tremors or fasciculations noted.    Diagnostic Data Reviewed:  BMET    Component Value Date/Time   NA 142 06/15/2024 0814   K 5.0 06/15/2024 0814   CL 105 06/15/2024 0814   CO2  21 06/15/2024 0814   GLUCOSE 90 06/15/2024 0814   GLUCOSE 93 07/19/2023 1155   BUN 20 06/15/2024 0814   CREATININE 0.73 06/15/2024 0814   CALCIUM 9.7 06/15/2024 0814   GFRNONAA >60 05/20/2022 0155   GFRAA 101 11/25/2020 0919   Lab Results  Component Value Date   HGBA1C 5.2 01/18/2024   HGBA1C 5.3 12/26/2019   Lab Results  Component Value Date   INSULIN  7.6 01/18/2024   INSULIN  13.6 11/04/2022   Lab Results  Component Value Date   TSH 1.250 01/13/2023   CBC    Component Value Date/Time   WBC 5.0 01/18/2024 0757   WBC 6.7 05/12/2022 0959   RBC 4.96 01/18/2024 0757   RBC 4.85 05/12/2022 0959   HGB 14.5 01/18/2024 0757   HCT 43.7 01/18/2024 0757   PLT 311 01/18/2024 0757   MCV 88 01/18/2024 0757   MCH 29.2 01/18/2024 0757   MCH 29.5 05/12/2022 0959   MCHC 33.2 01/18/2024 0757   MCHC 32.9 05/12/2022 0959   RDW 13.0 01/18/2024 0757   Iron Studies    Component Value Date/Time   IRON 68 11/18/2022 1117   TIBC 329 11/18/2022 1117   FERRITIN 62 11/18/2022 1117   IRONPCTSAT 21 11/18/2022 1117   Lipid Panel     Component Value Date/Time   CHOL 191 01/18/2024 0757   TRIG 106 01/18/2024 0757   HDL 53 01/18/2024 0757   CHOLHDL 4.3 01/13/2023 1344   LDLCALC 119 (H) 01/18/2024 0757   Hepatic Function Panel     Component Value Date/Time   PROT 6.3 06/15/2024 0814   ALBUMIN 4.3 06/15/2024 0814   AST 13 06/15/2024 0814   ALT 14 06/15/2024 0814   ALKPHOS 96 06/15/2024 0814   BILITOT 0.2 06/15/2024 0814   BILIDIR 0.0 11/02/2023 1024      Component Value Date/Time   TSH 1.250 01/13/2023 1344   Nutritional Lab Results  Component Value Date   VD25OH 49.8 04/19/2023   VD25OH 21.8 (L) 11/04/2022    Medications: Outpatient Encounter Medications as of 10/10/2024  Medication Sig   Cholecalciferol (VITAMIN D3) 50 MCG (2000 UT) capsule Take 1 capsule (2,000 Units total) by mouth daily.   citalopram  (CELEXA ) 20 MG tablet Take 1 tablet (20 mg total) by mouth daily.    diclofenac  Sodium (VOLTAREN ) 1 % GEL Apply 4 g topically 4 (four) times daily.   ergocalciferol  (VITAMIN D2) 1.25 MG (50000 UT) capsule Take 1 capsule (50,000 Units total) by mouth once a week.   ibuprofen  (ADVIL ) 800 MG tablet Take 1 tablet (800 mg total) by mouth every 8 (eight) hours as needed.   lamoTRIgine  (LAMICTAL ) 200 MG tablet Take 1 tablet (200 mg total) by mouth daily.   methocarbamol  (ROBAXIN ) 500 MG tablet Take 1 tablet (500 mg total) by mouth every 8 (eight) hours as needed for muscle spasms.   Multiple Vitamins-Minerals (MULTIVITAMIN WITH MINERALS) tablet Take 1 tablet by mouth daily.   ondansetron  (ZOFRAN -ODT) 4 MG disintegrating tablet Take 1 tablet (4 mg total) by mouth every 8 (eight) hours as needed for nausea or vomiting.   semaglutide -weight management (WEGOVY ) 0.5 MG/0.5ML SOAJ SQ injection Inject 0.5 mg into the skin once  a week.   temazepam  (RESTORIL ) 15 MG capsule Take 1 capsule (15 mg total) by mouth at bedtime as needed for sleep.   [DISCONTINUED] metFORMIN  (GLUCOPHAGE -XR) 500 MG 24 hr tablet Take 1 tablet (500 mg total) by mouth 2 (two) times daily with a meal.   [DISCONTINUED] semaglutide -weight management (WEGOVY ) 0.25 MG/0.5ML SOAJ SQ injection Inject 0.25 mg into the skin once a week.   metFORMIN  (GLUCOPHAGE -XR) 500 MG 24 hr tablet Take 1 tablet (500 mg total) by mouth 2 (two) times daily with a meal.   [DISCONTINUED] topiramate  (TOPAMAX ) 25 MG tablet Take 1 tablet (25 mg total) by mouth 2 (two) times daily.   No facility-administered encounter medications on file as of 10/10/2024.     Follow-Up   No follow-ups on file.Sarah Salas She was informed of the importance of frequent follow up visits to maximize her success with intensive lifestyle modifications for her multiple health conditions.  Attestation Statement   Reviewed by clinician on day of visit: allergies, medications, problem list, medical history, surgical history, family history, social history, and previous  encounter notes.     Lucas Parker, MD  "

## 2024-10-24 ENCOUNTER — Encounter: Payer: Self-pay | Admitting: Obstetrics & Gynecology

## 2024-10-24 ENCOUNTER — Ambulatory Visit: Payer: Self-pay | Admitting: Obstetrics & Gynecology

## 2024-10-24 VITALS — BP 117/76 | HR 81 | Ht 69.0 in | Wt 232.0 lb

## 2024-10-24 DIAGNOSIS — Z975 Presence of (intrauterine) contraceptive device: Secondary | ICD-10-CM

## 2024-10-24 DIAGNOSIS — Z30433 Encounter for removal and reinsertion of intrauterine contraceptive device: Secondary | ICD-10-CM | POA: Diagnosis not present

## 2024-10-24 DIAGNOSIS — Z30019 Encounter for initial prescription of contraceptives, unspecified: Secondary | ICD-10-CM

## 2024-10-24 MED ORDER — LEVONORGESTREL 20 MCG/DAY IU IUD
1.0000 | INTRAUTERINE_SYSTEM | Freq: Once | INTRAUTERINE | Status: AC
  Administered 2024-10-24: 1 via INTRAUTERINE

## 2024-10-24 NOTE — Progress Notes (Signed)
 Patient ID: Sarah Salas, female   DOB: 06-Apr-1976, 49 y.o.   MRN: [id number]

## 2024-10-24 NOTE — Progress Notes (Signed)
" ° ° °  GYNECOLOGY OFFICE PROCEDURE NOTE  Sarah Salas is a 49 y.o. G0P0000 here for IUD removal and  IUD insertion. No GYN concerns.  Last pap smear was on 11/2020 and was normal.  IUD Insertion Procedure Note Patient identified, informed consent performed, consent signed.   Discussed risks of irregular bleeding, cramping, infection, malpositioning or misplacement of the IUD outside the uterus which may require further procedure such as laparoscopy. Also discussed >99% contraception efficacy, increased risk of ectopic pregnancy with failure of method.   Emphasized that this did not protect against STIs, condoms recommended during all sexual encounters. Time out was performed.  Chaperone present.  Urine pregnancy test negative.  Speculum placed in the vagina.  Cervix visualized. IUD string seen and device removed intact with considerable cramping Cleaned with Betadine x 2.  Grasped anteriorly with a single tooth tenaculum.  Uterus sounded to 8 cm.  Mirena  IUD placed per manufacturer's recommendations.  Strings trimmed to 3 cm. Tenaculum was removed, good hemostasis noted.  Lots of cramping during the procedure  Patient was given post-procedure instructions.  She was advised to have backup contraception for one week.  Patient was also asked to check IUD strings periodically and follow up in 4 weeks for IUD check.   Eveline Lynwood MATSU, MD  Obstetrician & Gynecologist, [facility] for [facility], [facility] GroupPatient ID: Denelle Capurro, female   DOB: 1976-02-02, 49 y.o.   MRN: [id number]  "

## 2024-10-31 ENCOUNTER — Encounter (HOSPITAL_COMMUNITY): Payer: Self-pay | Admitting: Psychiatry

## 2024-10-31 ENCOUNTER — Telehealth (HOSPITAL_COMMUNITY): Admitting: Psychiatry

## 2024-10-31 ENCOUNTER — Encounter (INDEPENDENT_AMBULATORY_CARE_PROVIDER_SITE_OTHER): Payer: Self-pay | Admitting: Internal Medicine

## 2024-10-31 VITALS — Wt 232.0 lb

## 2024-10-31 DIAGNOSIS — F99 Mental disorder, not otherwise specified: Secondary | ICD-10-CM

## 2024-10-31 DIAGNOSIS — F319 Bipolar disorder, unspecified: Secondary | ICD-10-CM | POA: Diagnosis not present

## 2024-10-31 DIAGNOSIS — F419 Anxiety disorder, unspecified: Secondary | ICD-10-CM

## 2024-10-31 DIAGNOSIS — F5105 Insomnia due to other mental disorder: Secondary | ICD-10-CM

## 2024-10-31 DIAGNOSIS — F902 Attention-deficit hyperactivity disorder, combined type: Secondary | ICD-10-CM | POA: Diagnosis not present

## 2024-10-31 MED ORDER — LAMOTRIGINE 200 MG PO TABS: 200.0000 mg | ORAL_TABLET | Freq: Every day | ORAL | 2 refills | Status: AC

## 2024-10-31 MED ORDER — TEMAZEPAM 15 MG PO CAPS: 15.0000 mg | ORAL_CAPSULE | Freq: Every evening | ORAL | 2 refills | Status: AC | PRN

## 2024-10-31 MED ORDER — CITALOPRAM HYDROBROMIDE 20 MG PO TABS: 20.0000 mg | ORAL_TABLET | Freq: Every day | ORAL | 2 refills | Status: AC

## 2024-10-31 NOTE — Progress Notes (Signed)
 " [facility] MD Virtual Progress Note   Patient Location: Home Provider Location: Home Office  I connect with patient by video and verified that I am speaking with correct person by using two identifiers. I discussed the limitations of evaluation and management by telemedicine and the availability of in person appointments. I also discussed with the patient that there may be a patient responsible charge related to this service. The patient expressed understanding and agreed to proceed.  Keanu Frickey [id number] 49 y.o.  10/31/2024 8:41 AM  History of Present Illness:  Patient is evaluated by video session.  She reported Christmas was good because stepfather family member cook the food and she was very happy about it.  Today she is frustrated on her 20 year old niece [place] who she had a argument last Thursday before going to school.  Patient told that she has taken the responsibility to pick and drop her niece since her sister do not drive.  Patient told her niece most of the time is late going to school and then she get upset on the patient.  Last Thursday argument was escalated and patient got very upset and tried to talk to her mother and sister but she got more upset when they did not try to discipline her.  Otherwise she feels the medicine working very well.  She is sleeping at least 7 hours with the help of temazepam .  She is also pleased that Wegovy  approved and she is taking every day.  She also started a new gym called [employer] and she really liked it.  She is going at least 3-4 times a week.  She reported relationship is also going very well and her boyfriend is very supportive.  She kept very emotional and upset when she is talking about her 47 year old niece.  During the conversation she gets distracted but denies any active or passive suicidal thoughts or homicidal thoughts.  She is seeing weight loss physician on a regular basis.  She is not as upset on the weight because she  believe her weight is mostly muscle mass since she started going to gym regularly.  She is compliant with Celexa , Lamictal  and temazepam .  She has no rash itching tremor or shakes.  She denies any panic attack, crying spells.  She denies any hallucinations.  She denies any side effects.  She denies drinking or using any illegal substances.  Past Psychiatric History: H/O ADHD, bipolar disorder. Vyvanse , Strattera , Hydroxyzine , Serax  and Trazodone  did not help.  Adderall helped.  Latuda  helped in the beginning but then got worse with increased dose.  Abilify  and gabapentin  caused weight gain.  History of high liver enzymes.  No h/o paranoia, inpatient treatment or suicidal attempt. H/O TBI.    Past Medical History:  Diagnosis Date   ADHD (attention deficit hyperactivity disorder)    Allergy    Normal allergies   Anxiety    Arthritis    Bipolar 1 disorder (HCC)    Brain injury (HCC) 2005   Glaucoma    Hyperlipidemia    Hypertension    no longer treated    Outpatient Encounter Medications as of 10/31/2024  Medication Sig   Cholecalciferol (VITAMIN D3) 50 MCG (2000 UT) capsule Take 1 capsule (2,000 Units total) by mouth daily.   citalopram  (CELEXA ) 20 MG tablet Take 1 tablet (20 mg total) by mouth daily.   diclofenac  Sodium (VOLTAREN ) 1 % GEL Apply 4 g topically 4 (four) times daily.   ergocalciferol  (VITAMIN D2) 1.25 MG (  50000 UT) capsule Take 1 capsule (50,000 Units total) by mouth once a week.   lamoTRIgine  (LAMICTAL ) 200 MG tablet Take 1 tablet (200 mg total) by mouth daily.   metFORMIN  (GLUCOPHAGE -XR) 500 MG 24 hr tablet Take 1 tablet (500 mg total) by mouth 2 (two) times daily with a meal.   Multiple Vitamins-Minerals (MULTIVITAMIN WITH MINERALS) tablet Take 1 tablet by mouth daily.   semaglutide -weight management (WEGOVY ) 0.5 MG/0.5ML SOAJ SQ injection Inject 0.5 mg into the skin once a week.   temazepam  (RESTORIL ) 15 MG capsule Take 1 capsule (15 mg total) by mouth at bedtime as  needed for sleep.   No facility-administered encounter medications on file as of 10/31/2024.    No results found for this or any previous visit (from the past 2160 hours).    Psychiatric Specialty Exam: Physical Exam  Review of Systems  Weight 232 lb (105.2 kg).There is no height or weight on file to calculate BMI.  General Appearance: Casual  Eye Contact:  Good  Speech:  Pressured and fast  Volume:  Normal  Mood:  Anxious  Affect:  Labile  Thought Process:  Descriptions of Associations: Circumstantial  Orientation:  Full (Time, Place, and Person)  Thought Content:  Rumination  Suicidal Thoughts:  No  Homicidal Thoughts:  No  Memory:  Immediate;   Good Recent;   Fair Remote;   Fair  Judgement:  Fair  Insight:  Shallow  Psychomotor Activity:  Increased  Concentration:  Concentration: Fair and Attention Span: Fair  Recall:  [employer] of Knowledge:  Fair  Language:  Good  Akathisia:  No  Handed:  Right  AIMS (if indicated):     Assets:  Communication Skills Desire for Improvement Social Support Transportation  ADL's:  Intact  Cognition:  WNL  Sleep:  6-7 hrs ok with Temazepam         10/24/2024    9:43 AM 01/25/2024    9:28 AM 01/13/2023   10:33 AM 11/22/2018   10:23 AM 08/16/2018   10:47 AM  Depression screen PHQ 2/9  Decreased Interest 0 0 0 0 0  Down, Depressed, Hopeless 0 0 0 0 0  PHQ - 2 Score 0 0 0 0 0  Altered sleeping 0 0 0 0 0  Tired, decreased energy 0 0 0 0 0  Change in appetite 0 0 0 0 0  Feeling bad or failure about yourself  0 0 0 0 0  Trouble concentrating 0 0 0 0 0  Moving slowly or fidgety/restless 0 0 0 0 0  Suicidal thoughts 0 0 0 0 0  PHQ-9 Score 0 0  0  0  0   Difficult doing work/chores Not difficult at all Not difficult at all        Data saved with a previous flowsheet row definition    Assessment/Plan: Bipolar I disorder (HCC) - Plan: citalopram  (CELEXA ) 20 MG tablet, lamoTRIgine  (LAMICTAL ) 200 MG tablet, temazepam  (RESTORIL ) 15 MG  capsule  Anxiety - Plan: citalopram  (CELEXA ) 20 MG tablet, lamoTRIgine  (LAMICTAL ) 200 MG tablet, temazepam  (RESTORIL ) 15 MG capsule  Attention deficit hyperactivity disorder (ADHD), combined type - Plan: citalopram  (CELEXA ) 20 MG tablet, lamoTRIgine  (LAMICTAL ) 200 MG tablet  Insomnia due to other mental disorder - Plan: citalopram  (CELEXA ) 20 MG tablet, temazepam  (RESTORIL ) 15 MG capsule  Patient is a 49 year old Caucasian female with bipolar disorder, anxiety, ADHD and insomnia.  Discussed family stress specially having an argument with 32 year old niece.  Discussed to use coping  skills and walk away with the argument.  Encouraged to continue going to gym and focus on her general health.  She is going to have a blood work very soon at her weight loss program.  So far no major concern or side effects of the medication.  She is pleased Wegovy  is approved from insurance.  Continue temazepam  50 mg at bedtime, Celexa  20 mg daily, Lamictal  200 mg daily.  Recommend to call back if she has any question or any concern.  She is also on metformin  for fatty liver.  Follow-up in 3 months unless patient need a sooner appointment.  Follow Up Instructions:     I discussed the assessment and treatment plan with the patient. The patient was provided an opportunity to ask questions and all were answered. The patient agreed with the plan and demonstrated an understanding of the instructions.   The patient was advised to call back or seek an in-person evaluation if the symptoms worsen or if the condition fails to improve as anticipated.    Collaboration of Care: Other provider involved in patient's care AEB notes are available in epic to review  Patient/Guardian was advised Release of Information must be obtained prior to any record release in order to collaborate their care with an outside provider. Patient/Guardian was advised if they have not already done so to contact the registration department to sign all  necessary forms in order for us  to release information regarding their care.   Consent: Patient/Guardian gives verbal consent for treatment and assignment of benefits for services provided during this visit. Patient/Guardian expressed understanding and agreed to proceed.     Total encounter time 22 minutes which includes face-to-face time, chart reviewed, care coordination, order entry and documentation during this encounter.   Note: This document was prepared by [employer] voice dictation technology and any errors that results from this process are unintentional.    Leni ONEIDA Client, MD 10/31/2024   "

## 2024-11-01 NOTE — Telephone Encounter (Signed)
 Called pt, she has enough medication and will refill, do PA, at next office visit.

## 2024-11-03 ENCOUNTER — Other Ambulatory Visit (INDEPENDENT_AMBULATORY_CARE_PROVIDER_SITE_OTHER): Payer: Self-pay | Admitting: Internal Medicine

## 2024-11-03 DIAGNOSIS — K760 Fatty (change of) liver, not elsewhere classified: Secondary | ICD-10-CM

## 2024-11-03 DIAGNOSIS — R638 Other symptoms and signs concerning food and fluid intake: Secondary | ICD-10-CM

## 2024-11-03 DIAGNOSIS — Z6833 Body mass index (BMI) 33.0-33.9, adult: Secondary | ICD-10-CM

## 2024-11-03 DIAGNOSIS — E88819 Insulin resistance, unspecified: Secondary | ICD-10-CM

## 2024-11-08 NOTE — Telephone Encounter (Signed)
 LAST APPOINTMENT DATE: 10/10/2024 NEXT APPOINTMENT DATE: 11/13/2024   [facility] [address], [place] - [address] [place] [address] Phone: [phone] Fax: [phone]  [facility] [place] [facility] [address] [place] [address] Phone: [phone] Fax: [phone]  [facility] - [facility] [address] [place] [address] Phone: [phone] Fax: [phone]  Patient is requesting a refill of the following medications: Requested Prescriptions   Pending Prescriptions Disp Refills   WEGOVY  0.5 MG/0.5ML SOAJ SQ injection [Pharmacy Med Name: Wegovy  0.5 MG/0.5ML Subcutaneous Solution Auto-injector] 4 mL 0    Sig: INJECT 0.5 MG SUBCUTANEOUSLY ONCE A WEEK    Date last filled: 10/10/2024 Previously prescribed by Dr. Francyne  Lab Results  Component Value Date   HGBA1C 5.2 01/18/2024   HGBA1C 5.6 11/04/2022   HGBA1C 5.2 11/25/2020   Lab Results  Component Value Date   LDLCALC 119 (H) 01/18/2024   CREATININE 0.73 06/15/2024   Lab Results  Component Value Date   VD25OH 49.8 04/19/2023   VD25OH 21.8 (L) 11/04/2022    BP Readings from Last 3 Encounters:  10/24/24 117/76  10/10/24 119/78  09/12/24 120/78

## 2024-11-09 ENCOUNTER — Encounter (INDEPENDENT_AMBULATORY_CARE_PROVIDER_SITE_OTHER): Payer: Self-pay | Admitting: Internal Medicine

## 2024-11-09 ENCOUNTER — Telehealth (INDEPENDENT_AMBULATORY_CARE_PROVIDER_SITE_OTHER): Admitting: Internal Medicine

## 2024-11-09 VITALS — Ht 69.0 in

## 2024-11-09 DIAGNOSIS — E66811 Obesity, class 1: Secondary | ICD-10-CM

## 2024-11-09 DIAGNOSIS — R638 Other symptoms and signs concerning food and fluid intake: Secondary | ICD-10-CM | POA: Diagnosis not present

## 2024-11-09 DIAGNOSIS — K760 Fatty (change of) liver, not elsewhere classified: Secondary | ICD-10-CM | POA: Diagnosis not present

## 2024-11-09 DIAGNOSIS — E88819 Insulin resistance, unspecified: Secondary | ICD-10-CM | POA: Diagnosis not present

## 2024-11-09 DIAGNOSIS — Z6833 Body mass index (BMI) 33.0-33.9, adult: Secondary | ICD-10-CM | POA: Diagnosis not present

## 2024-11-09 MED ORDER — WEGOVY 1 MG/0.5ML ~~LOC~~ SOAJ: 1.0000 mg | SUBCUTANEOUS | 0 refills | Status: AC

## 2024-11-09 NOTE — Assessment & Plan Note (Signed)
 MASLD related to obesity and insulin  resistance.  Management is focused on sustained weight loss and metabolic optimization, which are expected to improve hepatic steatosis and inflammation.  Continue current management inclusive of GLP-1; monitor liver enzymes and fibrosis risk over time.

## 2024-11-09 NOTE — Assessment & Plan Note (Signed)
 On metformin  and GLP-1 without any signs or symptoms of hypoglycemia.  Continue combination therapy for synergy

## 2024-11-09 NOTE — Progress Notes (Unsigned)
 "  Virtual Visit via Video Note  I connected withNAME@ on 11/09/24 at  3:20 PM EST by a video enabled telemedicine application and verified that I am speaking with the correct person using two identifiers.  Location:  Patient: Home Provider: Office   I discussed the limitations of evaluation and management by telemedicine and the availability of in person appointments. The patient expressed understanding and agreed to proceed.    Weight Summary And Biometrics  No data recorded Anthropometric Measurements Height: 5' 9 (1.753 m) Starting Weight: 268 lb Peak Weight: 450 lb   No data recorded  RMR: 2102  Today's Visit #: 29  Starting Date: 08/28/22   Subjective   Chief Complaint: Obesity  Discussed the use of AI scribe software for clinical note transcription with the patient, who gave verbal consent to proceed.  History of Present Illness Reveca Desmarais is a 49 year old female who presents for medical weight management.  She is adhering to a twelve hundred calorie nutrition plan approximately seventy-five percent of the time and has lost two pounds since her last office visit.  She is doing cardio and strengthening 5 days a week about 120 minutes  She has restarted Wegovy  at a dose of 0.5 mg once a week after a previous discontinuation due to Medicaid changes. She experiences no nausea or vomiting and is pleased with the medication's effects on her appetite and sense of fullness, feeling more in control and noting improvements in her mental health.  She is also taking metformin  and is satisfied with its use. She has not experienced symptoms of low blood sugar, such as sweating, feeling shaky, or weak.  She is actively engaging in physical activity, finding that working out at the gym has been beneficial.     Patient Reported Barriers to Progress: strong hunger signals and/or impaired satiety / inhibitory control.   Pharmacotherapy for weight management: She is  currently taking Wegovy  with adequate clinical response  and without side effects..   Assessment and Plan   Treatment Plan For Obesity:  Recommended Dietary Goals  Adasia is currently in the action stage of change. As such, her goal is to continue weight management plan. She has agreed to: continue current reduced-calorie meal plan  Behavioral Health and Counseling  We discussed the following behavioral modification strategies today: continue to work on maintaining a reduced calorie state, getting the recommended amount of protein, incorporating whole foods, making healthy choices, staying well hydrated and practicing mindfulness when eating. and increase protein intake, fibrous foods (25 grams per day for women, 30 grams for men) and water to improve satiety and decrease hunger signals. .  Additional education and resources provided today: None  Recommended Physical Activity Goals  Karter has been advised to work up to 150 minutes of moderate intensity aerobic activity a week and strengthening exercises 2-3 times per week for cardiovascular health, weight loss maintenance and preservation of muscle mass.   She has agreed to :  Continue current level of physical activity   Pharmacotherapy  We discussed various medication options to help Hermie with her weight loss efforts and we both agreed to : Increase Wegovy  to 1 mg once a week  Associated Conditions Impacted by Obesity Treatment  Assessment & Plan Class 1 obesity with serious comorbidity and body mass index (BMI) of 33.0 to 33.9 in adult, unspecified obesity type Seebeck has now started to lose weight again after experiencing weight regain following discontinuation of Wegovy .  Considering degree of obesity  as well as cardiometabolic risk in addition to lifestyle changes, nutritional and behavioral strategies she benefits from treatment with GLP-1.  She is tolerating Wegovy  with improvement in appetite and ensuing weight loss and no  side effects.  We will increase Wegovy  to 1 mg once a week.  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  Management is focused on sustained weight loss and metabolic optimization, which are expected to improve hepatic steatosis and inflammation.  Continue current management inclusive of GLP-1; monitor liver enzymes and fibrosis risk over time.  Abnormal food appetite Improved on Wegovy . She has increased orexigenic signaling, impaired satiety and inhibitory control. This is secondary to an abnormal energy regulation system and pathological neurohormonal pathways characteristic of excess adiposity.  In addition to nutritional and behavioral strategies she benefits from ongoing pharmacotherapy.  We will increase Wegovy  to 1 mg once a week Insulin  resistance On metformin  and GLP-1 without any signs or symptoms of hypoglycemia.  Continue combination therapy for synergy          Objective   Physical Exam:  Height 5' 9 (1.753 m). Body mass index is 34.26 kg/m.  General: She is overweight, cooperative, alert, well developed, and in no acute distress. PSYCH: Has normal mood, affect and thought process.   Lungs: Normal breathing effort, no conversational dyspnea.    Diagnostic Data Reviewed:  BMET    Component Value Date/Time   NA 142 06/15/2024 0814   K 5.0 06/15/2024 0814   CL 105 06/15/2024 0814   CO2 21 06/15/2024 0814   GLUCOSE 90 06/15/2024 0814   GLUCOSE 93 07/19/2023 1155   BUN 20 06/15/2024 0814   CREATININE 0.73 06/15/2024 0814   CALCIUM 9.7 06/15/2024 0814   GFRNONAA >60 05/20/2022 0155   GFRAA 101 11/25/2020 0919   Lab Results  Component Value Date   HGBA1C 5.2 01/18/2024   HGBA1C 5.3 12/26/2019   Lab Results  Component Value Date   INSULIN  7.6 01/18/2024   INSULIN  13.6 11/04/2022   Lab Results  Component Value Date   TSH 1.250 01/13/2023   CBC     Component Value Date/Time   WBC 5.0 01/18/2024 0757   WBC 6.7 05/12/2022 0959   RBC 4.96 01/18/2024 0757   RBC 4.85 05/12/2022 0959   HGB 14.5 01/18/2024 0757   HCT 43.7 01/18/2024 0757   PLT 311 01/18/2024 0757   MCV 88 01/18/2024 0757   MCH 29.2 01/18/2024 0757   MCH 29.5 05/12/2022 0959   MCHC 33.2 01/18/2024 0757   MCHC 32.9 05/12/2022 0959   RDW 13.0 01/18/2024 0757   Iron Studies    Component Value Date/Time   IRON 68 11/18/2022 1117   TIBC 329 11/18/2022 1117   FERRITIN 62 11/18/2022 1117   IRONPCTSAT 21 11/18/2022 1117   Lipid Panel     Component Value Date/Time   CHOL 191 01/18/2024 0757   TRIG 106 01/18/2024 0757   HDL 53 01/18/2024 0757   CHOLHDL 4.3 01/13/2023 1344   LDLCALC 119 (H) 01/18/2024 0757   Hepatic Function Panel     Component Value Date/Time   PROT 6.3 06/15/2024 0814   ALBUMIN 4.3 06/15/2024 0814   AST 13 06/15/2024 0814   ALT 14 06/15/2024 0814   ALKPHOS 96 06/15/2024 0814   BILITOT 0.2 06/15/2024 0814   BILIDIR 0.0 11/02/2023 1024      Component Value Date/Time   TSH 1.250 01/13/2023 1344  Nutritional Lab Results  Component Value Date   VD25OH 49.8 04/19/2023   VD25OH 21.8 (L) 11/04/2022    Follow-Up   No follow-ups on file.SABRA She was informed of the importance of frequent follow up visits to maximize her success with intensive lifestyle modifications for her multiple health conditions.  Attestation Statement   Reviewed by clinician on day of visit: allergies, medications, problem list, medical history, surgical history, family history, social history, and previous encounter notes.   I discussed the assessment and treatment plan with the patient. The patient was provided an opportunity to ask questions and all were answered. The patient agreed with the plan and demonstrated an understanding of the instructions.   The patient was advised to call back or seek an in-person evaluation if the symptoms worsen or if the condition  fails to improve as anticipated.  I have spent 16 minutes in the care of the patient today including: 2 minutes before the visit reviewing and preparing the chart. 10 minutes face-to-face assessing and reviewing listed medical problems as outlined in obesity care plan, providing nutritional and behavioral counseling on topics outlined in the obesity care plan, independently interpreting test results and goals of care, as described in assessment and plan, reviewing and discussing biometric information and progress, ordering medications - see orders, and counseling assessment regarding medication side effects 4 minutes after the visit updating chart and documentation of encounter.    Lucas Parker, MD  "

## 2024-11-09 NOTE — Assessment & Plan Note (Signed)
 Improved on Wegovy . She has increased orexigenic signaling, impaired satiety and inhibitory control. This is secondary to an abnormal energy regulation system and pathological neurohormonal pathways characteristic of excess adiposity.  In addition to nutritional and behavioral strategies she benefits from ongoing pharmacotherapy.  We will increase Wegovy  to 1 mg once a week

## 2024-11-09 NOTE — Assessment & Plan Note (Signed)
 Sarah Salas has now started to lose weight again after experiencing weight regain following discontinuation of Wegovy .  Considering degree of obesity as well as cardiometabolic risk in addition to lifestyle changes, nutritional and behavioral strategies she benefits from treatment with GLP-1.  She is tolerating Wegovy  with improvement in appetite and ensuing weight loss and no side effects.  We will increase Wegovy  to 1 mg once a week.  She has not needed to use ondansetron 

## 2024-11-13 ENCOUNTER — Telehealth (INDEPENDENT_AMBULATORY_CARE_PROVIDER_SITE_OTHER): Admitting: Internal Medicine

## 2024-11-13 ENCOUNTER — Ambulatory Visit (INDEPENDENT_AMBULATORY_CARE_PROVIDER_SITE_OTHER): Admitting: Internal Medicine

## 2024-11-23 ENCOUNTER — Ambulatory Visit: Payer: Self-pay | Admitting: Physician Assistant

## 2024-12-11 ENCOUNTER — Ambulatory Visit (INDEPENDENT_AMBULATORY_CARE_PROVIDER_SITE_OTHER): Admitting: Internal Medicine

## 2025-01-26 ENCOUNTER — Ambulatory Visit: Admitting: Nurse Practitioner

## 2025-01-30 ENCOUNTER — Telehealth (HOSPITAL_COMMUNITY): Admitting: Psychiatry
